# Patient Record
Sex: Male | Born: 1935
Health system: Southern US, Community
[De-identification: ages and names within clinical notes are randomized; demographics above are authoritative.]

## PROBLEM LIST (undated history)

## (undated) DIAGNOSIS — M1990 Unspecified osteoarthritis, unspecified site: Secondary | ICD-10-CM

## (undated) DIAGNOSIS — N183 Chronic kidney disease, stage 3 (moderate): Secondary | ICD-10-CM

## (undated) DIAGNOSIS — I700 Atherosclerosis of aorta: Secondary | ICD-10-CM

## (undated) DIAGNOSIS — I219 Acute myocardial infarction, unspecified: Secondary | ICD-10-CM

## (undated) DIAGNOSIS — I82402 Acute embolism and thrombosis of unspecified deep veins of left lower extremity: Secondary | ICD-10-CM

## (undated) DIAGNOSIS — I96 Gangrene, not elsewhere classified: Secondary | ICD-10-CM

## (undated) DIAGNOSIS — I2699 Other pulmonary embolism without acute cor pulmonale: Secondary | ICD-10-CM

## (undated) DIAGNOSIS — I10 Essential (primary) hypertension: Secondary | ICD-10-CM

## (undated) DIAGNOSIS — E785 Hyperlipidemia, unspecified: Secondary | ICD-10-CM

## (undated) HISTORY — PX: MELANOMA EXCISION: SHX5266

## (undated) HISTORY — DX: Hyperlipidemia, unspecified: E78.5

## (undated) HISTORY — PX: JOINT REPLACEMENT: SHX530

## (undated) HISTORY — DX: Essential (primary) hypertension: I10

## (undated) HISTORY — DX: Gangrene, not elsewhere classified: I96

## (undated) HISTORY — PX: COLONOSCOPY: SHX174

## (undated) HISTORY — DX: Other pulmonary embolism without acute cor pulmonale: I26.99

## (undated) HISTORY — DX: Acute myocardial infarction, unspecified: I21.9

## (undated) HISTORY — DX: Atherosclerosis of aorta: I70.0

## (undated) HISTORY — DX: Unspecified osteoarthritis, unspecified site: M19.90

## (undated) HISTORY — DX: Chronic kidney disease, stage 3 (moderate): N18.3

## (undated) HISTORY — DX: Acute embolism and thrombosis of unspecified deep veins of left lower extremity: I82.402

---

## 1998-04-27 ENCOUNTER — Ambulatory Visit (HOSPITAL_COMMUNITY): Admission: RE | Admit: 1998-04-27 | Discharge: 1998-04-27 | Payer: Self-pay | Admitting: Family Medicine

## 1998-04-27 ENCOUNTER — Encounter: Payer: Self-pay | Admitting: Family Medicine

## 2000-09-30 ENCOUNTER — Encounter: Payer: Self-pay | Admitting: Orthopedic Surgery

## 2000-10-06 ENCOUNTER — Inpatient Hospital Stay (HOSPITAL_COMMUNITY): Admission: RE | Admit: 2000-10-06 | Discharge: 2000-10-10 | Payer: Self-pay | Admitting: Orthopedic Surgery

## 2001-06-15 ENCOUNTER — Ambulatory Visit (HOSPITAL_COMMUNITY): Admission: RE | Admit: 2001-06-15 | Discharge: 2001-06-15 | Payer: Self-pay | Admitting: Gastroenterology

## 2001-06-15 ENCOUNTER — Encounter (INDEPENDENT_AMBULATORY_CARE_PROVIDER_SITE_OTHER): Payer: Self-pay | Admitting: Specialist

## 2002-02-04 ENCOUNTER — Inpatient Hospital Stay (HOSPITAL_COMMUNITY): Admission: EM | Admit: 2002-02-04 | Discharge: 2002-02-08 | Payer: Self-pay | Admitting: Emergency Medicine

## 2002-02-04 ENCOUNTER — Encounter: Payer: Self-pay | Admitting: Emergency Medicine

## 2002-09-23 ENCOUNTER — Inpatient Hospital Stay (HOSPITAL_COMMUNITY): Admission: EM | Admit: 2002-09-23 | Discharge: 2002-09-24 | Payer: Self-pay | Admitting: Emergency Medicine

## 2002-09-23 ENCOUNTER — Encounter: Payer: Self-pay | Admitting: Cardiology

## 2004-07-01 HISTORY — PX: CORONARY STENT PLACEMENT: SHX1402

## 2004-07-05 ENCOUNTER — Ambulatory Visit (HOSPITAL_COMMUNITY): Admission: RE | Admit: 2004-07-05 | Discharge: 2004-07-05 | Payer: Self-pay | Admitting: Gastroenterology

## 2004-10-11 ENCOUNTER — Ambulatory Visit: Payer: Self-pay | Admitting: Cardiology

## 2005-10-31 ENCOUNTER — Ambulatory Visit: Payer: Self-pay | Admitting: Cardiology

## 2005-11-13 ENCOUNTER — Ambulatory Visit: Payer: Self-pay | Admitting: Cardiology

## 2006-01-24 ENCOUNTER — Inpatient Hospital Stay (HOSPITAL_COMMUNITY): Admission: EM | Admit: 2006-01-24 | Discharge: 2006-02-01 | Payer: Self-pay | Admitting: Emergency Medicine

## 2006-11-19 ENCOUNTER — Ambulatory Visit: Payer: Self-pay | Admitting: Cardiology

## 2007-11-25 ENCOUNTER — Ambulatory Visit: Payer: Self-pay | Admitting: Cardiology

## 2008-08-24 ENCOUNTER — Ambulatory Visit: Payer: Self-pay

## 2008-08-24 ENCOUNTER — Ambulatory Visit: Payer: Self-pay | Admitting: Cardiology

## 2008-08-24 LAB — CONVERTED CEMR LAB
Calcium: 9.5 mg/dL (ref 8.4–10.5)
GFR calc Af Amer: 105 mL/min
GFR calc non Af Amer: 87 mL/min

## 2008-09-07 ENCOUNTER — Ambulatory Visit: Payer: Self-pay

## 2008-09-07 ENCOUNTER — Ambulatory Visit: Payer: Self-pay | Admitting: Cardiology

## 2008-09-07 LAB — CONVERTED CEMR LAB
Calcium: 9.6 mg/dL (ref 8.4–10.5)
Creatinine, Ser: 0.9 mg/dL (ref 0.4–1.5)
GFR calc Af Amer: 105 mL/min
Glucose, Bld: 96 mg/dL (ref 70–99)

## 2008-10-20 DIAGNOSIS — E7800 Pure hypercholesterolemia, unspecified: Secondary | ICD-10-CM

## 2008-10-20 DIAGNOSIS — K56609 Unspecified intestinal obstruction, unspecified as to partial versus complete obstruction: Secondary | ICD-10-CM | POA: Insufficient documentation

## 2008-10-20 DIAGNOSIS — R7989 Other specified abnormal findings of blood chemistry: Secondary | ICD-10-CM | POA: Insufficient documentation

## 2008-10-20 DIAGNOSIS — N19 Unspecified kidney failure: Secondary | ICD-10-CM | POA: Insufficient documentation

## 2008-10-20 DIAGNOSIS — I10 Essential (primary) hypertension: Secondary | ICD-10-CM | POA: Insufficient documentation

## 2008-11-02 ENCOUNTER — Ambulatory Visit: Payer: Self-pay | Admitting: Cardiology

## 2008-11-16 ENCOUNTER — Encounter: Payer: Self-pay | Admitting: Cardiology

## 2008-11-16 ENCOUNTER — Ambulatory Visit: Payer: Self-pay

## 2008-11-16 ENCOUNTER — Ambulatory Visit: Payer: Self-pay | Admitting: Cardiology

## 2008-11-18 LAB — CONVERTED CEMR LAB
Cholesterol: 147 mg/dL (ref 0–200)
LDL Cholesterol: 87 mg/dL (ref 0–99)
Total CHOL/HDL Ratio: 5
Triglycerides: 156 mg/dL — ABNORMAL HIGH (ref 0.0–149.0)

## 2009-02-08 ENCOUNTER — Ambulatory Visit: Payer: Self-pay | Admitting: Cardiology

## 2009-02-10 LAB — CONVERTED CEMR LAB
ALT: 16 units/L (ref 0–53)
AST: 29 units/L (ref 0–37)
Albumin: 4.1 g/dL (ref 3.5–5.2)
Alkaline Phosphatase: 73 units/L (ref 39–117)
Bilirubin, Direct: 0 mg/dL (ref 0.0–0.3)
HDL: 26.7 mg/dL — ABNORMAL LOW (ref >39.00)
Total CHOL/HDL Ratio: 6
Total Protein: 7.9 g/dL (ref 6.0–8.3)
VLDL: 42.2 mg/dL — ABNORMAL HIGH (ref 0.0–40.0)

## 2009-09-15 ENCOUNTER — Telehealth: Payer: Self-pay | Admitting: Cardiology

## 2009-09-20 ENCOUNTER — Encounter: Payer: Self-pay | Admitting: Cardiology

## 2009-12-13 ENCOUNTER — Ambulatory Visit: Payer: Self-pay | Admitting: Cardiology

## 2009-12-20 ENCOUNTER — Telehealth: Payer: Self-pay | Admitting: Cardiology

## 2010-01-18 ENCOUNTER — Telehealth (INDEPENDENT_AMBULATORY_CARE_PROVIDER_SITE_OTHER): Payer: Self-pay

## 2010-01-22 ENCOUNTER — Ambulatory Visit: Payer: Self-pay

## 2010-01-22 ENCOUNTER — Ambulatory Visit (HOSPITAL_COMMUNITY): Admission: RE | Admit: 2010-01-22 | Discharge: 2010-01-22 | Payer: Self-pay | Admitting: Cardiology

## 2010-01-22 ENCOUNTER — Encounter: Payer: Self-pay | Admitting: Cardiology

## 2010-01-22 ENCOUNTER — Encounter (INDEPENDENT_AMBULATORY_CARE_PROVIDER_SITE_OTHER): Payer: Self-pay

## 2010-01-22 ENCOUNTER — Ambulatory Visit: Payer: Self-pay | Admitting: Cardiology

## 2010-01-24 ENCOUNTER — Telehealth: Payer: Self-pay | Admitting: Cardiology

## 2010-01-25 ENCOUNTER — Telehealth: Payer: Self-pay | Admitting: Cardiology

## 2010-02-19 ENCOUNTER — Inpatient Hospital Stay (HOSPITAL_COMMUNITY): Admission: RE | Admit: 2010-02-19 | Discharge: 2010-02-22 | Payer: Self-pay | Admitting: Orthopedic Surgery

## 2010-05-15 ENCOUNTER — Encounter (INDEPENDENT_AMBULATORY_CARE_PROVIDER_SITE_OTHER): Payer: Self-pay | Admitting: *Deleted

## 2010-06-13 ENCOUNTER — Ambulatory Visit: Payer: Self-pay | Admitting: Cardiology

## 2010-06-13 ENCOUNTER — Encounter: Payer: Self-pay | Admitting: Cardiology

## 2010-06-27 ENCOUNTER — Telehealth: Payer: Self-pay | Admitting: Cardiology

## 2010-07-11 ENCOUNTER — Other Ambulatory Visit: Payer: Self-pay | Admitting: Cardiology

## 2010-07-11 ENCOUNTER — Ambulatory Visit
Admission: RE | Admit: 2010-07-11 | Discharge: 2010-07-11 | Payer: Self-pay | Source: Home / Self Care | Attending: Cardiology | Admitting: Cardiology

## 2010-07-11 LAB — BASIC METABOLIC PANEL
BUN: 24 mg/dL — ABNORMAL HIGH (ref 6–23)
CO2: 23 mEq/L (ref 19–32)
Calcium: 9.5 mg/dL (ref 8.4–10.5)
Chloride: 109 mEq/L (ref 96–112)
Creatinine, Ser: 1.1 mg/dL (ref 0.4–1.5)
GFR: 69.17 mL/min (ref >60.00)
Glucose, Bld: 83 mg/dL (ref 70–99)
Potassium: 5.2 mEq/L — ABNORMAL HIGH (ref 3.5–5.1)
Sodium: 141 mEq/L (ref 135–145)

## 2010-07-11 LAB — HEPATIC FUNCTION PANEL
ALT: 19 U/L (ref 0–53)
AST: 23 U/L (ref 0–37)
Albumin: 4.1 g/dL (ref 3.5–5.2)
Alkaline Phosphatase: 84 U/L (ref 39–117)
Bilirubin, Direct: 0.2 mg/dL (ref 0.0–0.3)
Total Bilirubin: 0.5 mg/dL (ref 0.3–1.2)
Total Protein: 7.6 g/dL (ref 6.0–8.3)

## 2010-07-11 LAB — LIPID PANEL
Cholesterol: 154 mg/dL (ref 0–200)
HDL: 32.6 mg/dL — ABNORMAL LOW (ref >39.00)
LDL Cholesterol: 89 mg/dL (ref 0–99)
Total CHOL/HDL Ratio: 5
Triglycerides: 162 mg/dL — ABNORMAL HIGH (ref 0.0–149.0)
VLDL: 32.4 mg/dL (ref 0.0–40.0)

## 2010-07-31 NOTE — Assessment & Plan Note (Signed)
Summary: appt 11:15/rov/surgical clearance right totaal hip/off plavix...  Medications Added OSCAL 500/200 D-3 500-200 MG-UNIT TABS (CALCIUM-VITAMIN D) once daily CRESTOR 5 MG TABS (ROSUVASTATIN CALCIUM) one tablet daily BENAZEPRIL HCL 40 MG TABS (BENAZEPRIL HCL) one tablet daily      Allergies Added:   Primary Provider:  Merri Brunette, MD  CC:  surgical clearance for right total hip replacement.  Pt also brought leave of absence paperwork.  .  History of Present Illness: 77 yo with history CAD (chronic total occlusion of LAD), HTN, hyperlipidemia presents for followup of coronary disease. He has had no chest pain.  Main complaint has been severe right hip pain.  He is in need of right THR.  He has been severely limited by his hip pain and is not very active. He is unable to get up a flight of steps.  He denies exertional dyspnea, but as mentioned, he is minimally active.    Patient has been off Crestor for several months (ran out and did not call for refills).  Similarly, he has been off benazepril for several weeks and BP is now running high (169/82).    ECG:  NSR, normal ECG  Labs (3/10):  K 4.6, creatinine 0.9 Labs (8/10): LDL 85, HDL 27, TGs 211  Current Medications (verified): 1)  Plavix 75 Mg Tabs (Clopidogrel Bisulfate) .... Take 1 Tablet Once Daily 2)  Atenolol 50 Mg Tabs (Atenolol) .... Take 1 Tablet Once Daily 3)  Oscal 500/200 D-3 500-200 Mg-Unit Tabs (Calcium-Vitamin D) .... Once Daily  Allergies (verified): 1)  ! Aspirin  Past History:  Past Medical History: 1.  CAD:  Pt's 1st MI was in 1982.  He has a chronic total occlusion of the LAD with good collaterals from the RCA.  Echo (5/10): EF 55-60%, normal wall motion, mild MR, PA systolic pressure 32 mmHg.  2.  HYPERCHOLESTEROLEMIA  IIA (ICD-272.0) 3.  HYPERTENSION, UNSPECIFIED (ICD-401.9) 4.  SMALL BOWEL OBSTRUCTION (ICD-560.9) 5.  Aspirin allergy:  Pt is taking Plavix 6.  Intolerances to Vytorin, Zocor,  pravastatin.     Family History: Reviewed history from 10/20/2008 and no changes required. Family History of Hypertension:   Social History: Reviewed history from 11/02/2008 and no changes required. Works as a Merchandiser, retail at [employer]  Tobacco Use - No (quit 1984)  Alcohol Use - no Drug Use - no  Review of Systems       All systems reviewed and negative except as per HPI.   Vital Signs:  Patient profile:   77 year old male Height:      67 inches Weight:      150 pounds BMI:     23.58 Pulse rate:   61 / minute Pulse rhythm:   regular BP sitting:   168 / 82  (left arm) Cuff size:   regular  Vitals Entered By: Judithe Modest CMA (December 13, 2009 11:10 AM)  Physical Exam  General:  Well developed, well nourished, in no acute distress. Neck:  Neck supple, no JVD. No masses, thyromegaly or abnormal cervical nodes. Lungs:  Clear bilaterally to auscultation and percussion. Heart:  Non-displaced PMI, chest non-tender; regular rate and rhythm, S1, S2 without murmurs.  +S4. Carotid upstroke normal, no bruit. Pedals normal pulses. No edema, no varicosities. Abdomen:  Bowel sounds positive; abdomen soft and non-tender without masses, organomegaly, or hernias noted. No hepatosplenomegaly. Msk:  Antalgic gait Extremities:  No clubbing or cyanosis. Neurologic:  Alert and oriented x 3. Psych:  Normal affect.  Impression & Recommendations:  Problem # 1:  PREOPERATIVE EVALUATION Patient needs right THR.  He has very minimal exercise tolerance at this point due to hip pain.  I think that the safest course will be to get a dobutamine stress echo for risk stratification prior to surgery.  He will have to stop Plavix 5 days prior to surgery and start it back as soon as possible afterwards.    Problem # 2:  CAD, UNSPECIFIED SITE (ICD-414.00) Stable with no ischemic symptoms but minimal exercise capacity due to hip pain.  As above, DSE pre-surgery. He cannot take aspirin due to an  allergy but he is on Plavix.    Problem # 3:  HYPERTENSION, UNSPECIFIED (ICD-401.9) BP elevated.  Restart benazepril 40 mg daily with BMET and BP check in 2 wks.    Problem # 4:  HYPERCHOLESTEROLEMIA  IIA (ICD-272.0) Restart Crestor 5 mg daily and repeat lipids/LFTs in 2 months.  Other Orders: EKG w/ Interpretation (93000) Dobutamine Echo (Dobutamine Echo)  Patient Instructions: 1)  Your physician has recommended you make the following change in your medication:  2)  Start Crestor 5mg  daily 3)  Start Benazepril 40mg  daily 4)  Your physician recommends that you return for lab work in: 2 weeks BMP 414.05 5)  Your physician has requested that you regularly monitor and record your blood pressure readings at home.  Please use the same machine at the same time of day to check your readings. I will call you in 2 weeks to get the readings. Luana Shu  6)  Your physician recommends that you return for a FASTING lipid profile/liver profile in 2 months--414.05  7)  Your physician has requested that you have a dobutamine echocardiogram.  For further information please visit [email or web address]  Please follow instruction sheet as given. 8)  Your physician wants you to follow-up in: 6 months with Dr Shirlee Latch.   You will receive a reminder letter in the mail two months in advance. If you don't receive a letter, please call our office to schedule the follow-up appointment. Prescriptions: BENAZEPRIL HCL 40 MG TABS (BENAZEPRIL HCL) one tablet daily  #30 x 11   Entered by:   Katina Dung, RN, BSN   Authorized by:   Marca Ancona, MD   Signed by:   Katina Dung, RN, BSN on 12/13/2009   Method used:   Electronically to        [facility] [phone]* (retail)       [address]       [place], [place]  [address]       Ph: [phone] or [phone]       Fax: [phone]   RxID:   [id number] CRESTOR 5 MG TABS (ROSUVASTATIN CALCIUM) one tablet daily  #30 x 3   Entered by:   Katina Dung, RN, BSN    Authorized by:   Marca Ancona, MD   Signed by:   Katina Dung, RN, BSN on 12/13/2009   Method used:   [facility]ron[facility] to        [facility] (retail[facility]       [address]       [place], [place]  [address]       Ph: [phone] or [phone]       Fax: [phone]   RxID:   [id number]

## 2010-07-31 NOTE — Progress Notes (Signed)
Summary: returning call   Phone Note Call from Patient   Caller: Patient Reason for Call: Talk to Nurse Summary of Call: returning call -pls call [phone] Initial call taken by: Glynda Jaeger,  January 24, 2010 11:05 AM  Follow-up for Phone Call        called pt with dobutamine echo results from 01/22/10

## 2010-07-31 NOTE — Progress Notes (Signed)
Summary: Surgical Clearance   ---- Converted from flag ---- ---- 01/25/2010 11:07 AM, Erle Crocker wrote: Olegario Messier form [facility] is needing Clearnace on this Pt.  Please fax to [phone] call back [phone]  Thanks, Selena Batten ------------------------------  Phone Note From Other Clinic   Summary of Call: Pt had dobutamine echo performed.  Await final review of this test by Dr Shirlee Latch for surgical clearance.  Julieta Gutting, RN, BSN  January 25, 2010 2:52 PM     Appended Document: Surgical Clearance OK for surgery (DSE was normal).   Appended Document: Surgical Clearance clearance faxed by Julieta Gutting 01/25/10

## 2010-07-31 NOTE — Progress Notes (Signed)
Summary: appt   Phone Note Call from Patient Call back at Home Phone [phone]   Caller: Patient Reason for Call: Talk to Nurse Summary of Call: pt unable to get off of work for appt, is it necessary that he comes in before his colonoscopy Initial call taken by: Migdalia Dk,  September 15, 2009 8:05 AM  Follow-up for Phone Call        per Dr Almon Hercules does not need appt with him before colonoscopy scheduled  for 09-20-09--I talked with wife to let her know pt does not need to see Dr Shirlee Latch before colonoscopy but should schedule an appt to see Dr Shirlee Latch soon(he is overdue). I faxed procedure form for colonoscopy from [facility] back to [facility] will forward form  to medical records to be scanned into EMR

## 2010-07-31 NOTE — Progress Notes (Signed)
Summary: Dobutamine Echo Pre-Procedure  Phone Note Outgoing Call Call back at [facility] Phone [phone]   Call placed by: Irean Hong, RN,  January 18, 2010 11:12 AM Summary of Call: Left message on patient's recorder with instructions for Dobutamine Echo. Saul Dorsi,RN.

## 2010-07-31 NOTE — Miscellaneous (Signed)
Summary: IV for Dobutamine Echo  Clinical Lists Changes     IV 20G angiocath (R) wrist for Dobutamine Echo. Nioka Thorington,RN.

## 2010-07-31 NOTE — Progress Notes (Signed)
Summary: bp readings 12/20/09   Phone Note Call from Patient   Summary of Call: BP READINGS 6/18 123/70 6/20 132/60s  Initial call taken by: Migdalia Dk,  December 20, 2009 8:03 AM  Follow-up for Phone Call        discussed with pt by telephone--will forward to Dr Shirlee Latch for review      Appended Document: bp readings 12/20/09--LMTCB 12/25/09 Good BP, continue current meds  Appended Document: bp readings 12/20/09 LMTCB   Appended Document: bp readings 12/20/09 LMTCB  Appended Document: bp readings 12/20/09 discussed with pt by telephone

## 2010-07-31 NOTE — Letter (Signed)
Summary: [facility] Surgical Clearance  [facility] Surgical Clearance   Imported By: Roderic Ovens 09/29/2009 10:38:50  _____________________________________________________________________  External Attachment:    Type:   Image     Comment:   External Document

## 2010-07-31 NOTE — Letter (Signed)
Summary: Appointment - Reminder 2  [facility], Main Office  [address]   [place], [place] [address]   Phone: [phone]  Fax: [phone]          May 15, 2010 MRN: [id number]        [name] [name] [address] [place], [place]  [address]     Dear Mr. Burgher,  Our records indicate that it is time to schedule a follow-up appointment.  Dr.Mclean recommended that you follow up with [place] in December 2011. It is very important that we reach you to schedule this appointment. We look forward to participating in your health care needs. Please contact us at the number listed above at your earliest convenience to schedule your appointment.  If you are unable to make an appointment at this time, give [place] a call so we can update our records.     Sincerely,   Glass blower/designer

## 2010-08-02 NOTE — Progress Notes (Signed)
Summary: BP f/u    ---- Converted from flag ---- ---- 06/14/2010 9:39 AM, Sherri Rad, RN, BSN wrote:   ---- 06/13/2010 2:00 PM, Katina Dung, RN, BSN wrote: [facility] you call him in 2 weeks (around 06/27/10) to get B/P readings for Dr Shirlee Latch? I will be gone that week. Thanks. Thurston Hole ------------------------------  Phone Note Outgoing Call   Call placed by: Sherri Rad, RN, BSN,  June 27, 2010 10:11 AM Call placed to: Patient Summary of Call: I left a message for the pt to call. Sherri Rad, RN, BSN  June 27, 2010 10:11 AM  I called the pt back to f/u on his bp readings. He states he has taken these about 3 or 4 times, but he cannot find one of the readings. He did report on 12/15- his SBP was 149. He reported a DBP of 125, but said he didn't think he wrote that down correctly. on 12/18- 130/63, & 12/20 - 125/66. He did not have his HR's written on the same paper and couldn't locate these, but he made the comment that he thought his HR's were reading a little low. I instructed the pt to continue to monitor his BP readings, but that I would pass these readings on to Dr. Shirlee Latch. He states that the day he came for his office visit, he had not had his meds. I explained we will call back after reviewing with Dr. Shirlee Latch. The pt is agreeable. Initial call taken by: Sherri Rad, RN, BSN,  June 28, 2010 3:36 PM     Appended Document: BP f/u  check and record BP and pulse daily for a week or so, call him back at that time.   Appended Document: BP f/u  I talked with pt--recent B/P readings  135/72 and 124/68 on medications--pt states he has been taking his medication regularly-  Appended Document: BP f/u  BP good  Appended Document: BP f/u  pt notified BP good

## 2010-08-02 NOTE — Assessment & Plan Note (Signed)
Summary: PER CHECK OUT/SF  Medications Added PLAVIX 75 MG TABS (CLOPIDOGREL BISULFATE) take 1 tablet once daily ATENOLOL 50 MG TABS (ATENOLOL) take 1 tablet once daily NITROSTAT 0.4 MG SUBL (NITROGLYCERIN) 1 tablet under tongue at onset of chest pain; you may repeat every 5 minutes for up to 3 doses. BENADRYL 25 MG TABS (DIPHENHYDRAMINE HCL) as needed      Allergies Added:   Visit Type:  Follow-up Primary Provider:  Merri Brunette, MD  CC:  no complaints.  History of Present Illness: 77 yo with history CAD (chronic total occlusion of LAD), HTN, hyperlipidemia presents for followup of coronary disease.   Patient had a dobutamine stress echo in 7/11 (pre-operative prior to right THR) that showed no evidence for ischemia or infarction.  He had no complications with his hip replacement and has been progressing well with physical therapy.  He is walking much better now, sometimes using a cane and sometimes not.  He is not short of breath with exertion.  No chest pain.  He has not taken any medications for 2 weeks (ran out).   ECG: NSR, inferior Qs  Labs (3/10):  K 4.6, creatinine 0.9 Labs (8/10): LDL 85, HDL 27, TGs 211  Current Medications (verified): 1)  Plavix 75 Mg Tabs (Clopidogrel Bisulfate) .... Take 1 Tablet Once Daily 2)  Atenolol 50 Mg Tabs (Atenolol) .... Take 1 Tablet Once Daily 3)  Oscal 500/200 D-3 500-200 Mg-Unit Tabs (Calcium-Vitamin D) .... Once Daily 4)  Crestor 5 Mg Tabs (Rosuvastatin Calcium) .... One Tablet Daily 5)  Benazepril Hcl 40 Mg Tabs (Benazepril Hcl) .... One Tablet Daily 6)  Nitrostat 0.4 Mg Subl (Nitroglycerin) .Marland Kitchen.. 1 Tablet Under Tongue At Onset of Chest Pain; You May Repeat Every 5 Minutes For Up To 3 Doses. 7)  Benadryl 25 Mg Tabs (Diphenhydramine Hcl) .... As Needed  Allergies (verified): 1)  ! Aspirin  Past History:  Past Medical History: 1.  CAD:  Pt's 1st MI was in 1982.  He has a chronic total occlusion of the LAD with good collaterals from  the RCA.  Echo (5/10): EF 55-60%, normal wall motion, mild MR, PA systolic pressure 32 mmHg.  Dobutamine stress echo (7/11) with EF 60% and no evidence for ischemia or infarction.  2.  HYPERCHOLESTEROLEMIA  IIA (ICD-272.0) 3.  HYPERTENSION, UNSPECIFIED (ICD-401.9) 4.  SMALL BOWEL OBSTRUCTION (ICD-560.9) 5.  Aspirin allergy:  Pt is taking Plavix 6.  Intolerances to Vytorin, Zocor, pravastatin.  7.  Right total hip replacement in 2011    Family History: Reviewed history from 10/20/2008 and no changes required. Family History of Hypertension:   Social History: Recently retired from [employer] Married  Tobacco Use - No (quit 1984)  Alcohol Use - no Drug Use - no  Review of Systems       All systems reviewed and negative except as per HPI.   Vital Signs:  Patient profile:   77 year old male Height:      67 inches Weight:      155 pounds BMI:     24.36 Pulse rate:   73 / minute BP sitting:   142 / 80  (left arm) Cuff size:   regular  Vitals Entered By: Hardin Negus, RMA (June 13, 2010 9:59 AM)  Physical Exam  General:  Well developed, well nourished, in no acute distress. Neck:  Neck supple, no JVD. No masses, thyromegaly or abnormal cervical nodes. Lungs:  Clear bilaterally to auscultation and percussion. Heart:  Non-displaced PMI,  chest non-tender; regular rate and rhythm, S1, S2 without murmurs.  +S4. Carotid upstroke normal, no bruit. Pedals normal pulses. No edema, no varicosities. Abdomen:  Bowel sounds positive; abdomen soft and non-tender without masses, organomegaly, or hernias noted. No hepatosplenomegaly. Extremities:  No clubbing or cyanosis. Neurologic:  Alert and oriented x 3. Psych:  Normal affect.   Impression & Recommendations:  Problem # 1:  CAD, UNSPECIFIED SITE (ICD-414.00) Stable with no ischemic symptoms.  He has done well post-THR.  DSE was normal in 7/11.  Unfortunately, he has been off all his medications for 2 weeks.  Restart Plavix,  atenolol, Crestor, and benazepril.  He is allergic to aspirin.  I will have him followup in 3 months to see how he is doing back on his meds.   Problem # 2:  HYPERCHOLESTEROLEMIA  IIA (ICD-272.0) Restart Crestor, will check lipids/LFTs in a month.   Problem # 3:  HYPERTENSION, UNSPECIFIED (ICD-401.9) BMET in a month after restarting benazepril.   Other Orders: EKG w/ Interpretation (93000)  Patient Instructions: 1)  Your physician has recommended you make the following change in your medication:  2)  Take Atenolol 50mg  daily. 3)  Take Plavix 75mg  daily. 4)  Take Crestor 5mg  daily. 5)  Take Benazepril 40mg  daily. 6)  PLEASE CALL OUR OFFICE IF YOU NEED REFILLS ON THESE MEDICATIONS. 7)  Take and record your blood pressure about 2 hours after you take your medication. I will call you in 2 weeks to get the readings. Luana Shu. 8)  Your physician recommends that you return for a FASTING lipid profile/liver profile/BMP in 1 month---414.01 401.9  9)  Your physician recommends that you schedule a follow-up appointment in: 3 months with Dr Shirlee Latch. Prescriptions: PLAVIX 75 MG TABS (CLOPIDOGREL BISULFATE) take 1 tablet once daily  #90 x 3   Entered by:   Katina Dung, RN, BSN   Authorized by:   Marca Ancona, MD   Signed by:   Katina Dung, RN, BSN on 06/13/2010   Method used:   Print then Give to Patient   RxID:   [id number] BENAZEPRIL HCL 40 MG TABS (BENAZEPRIL HCL) one tablet daily  #90 x 3   Entered by:   Katina Dung, RN, BSN   Authorized by:   Marca Ancona, MD   Signed by:   Katina Dung, RN, BSN on 06/13/2010   Method used:   Print then Give to Patient   RxID:   [id number] CRESTOR 5 MG TABS (ROSUVASTATIN CALCIUM) one tablet daily  #90 x 3   Entered by:   Katina Dung, RN, BSN   Authorized by:   Marca Ancona, MD   Signed by:   Katina Dung, RN, BSN on 06/13/2010   Method used:   Print then Give to Patient   RxID:   [id number] ATENOLOL 50 MG TABS  (ATENOLOL) take 1 tablet once daily  #90 x 3   Entered by:   Katina Dung, RN, BSN   Authorized by:   Marca Ancona, MD   Signed by:   Katina Dung, RN, BSN on 06/13/2010   Method used:   Print then Give to Patient   RxID:   [id number] CRESTOR 5 MG TABS (ROSUVASTATIN CALCIUM) one tablet daily  #90 x 3   Entered by:   Katina Dung, RN, BSN   Authorized by:   Marca Ancona, MD   Signed by:   Katina Dung, RN, BSN on 06/13/2010   Method used:   Print then Give to  Patient   RxID:   [id number] PLAVIX 75 MG TABS (CLOPIDOGREL BISULFATE) take 1 tablet once daily  #90 x 3   Entered by:   Katina Dung, RN, BSN   Authorized by:   Marca Ancona, MD   Signed by:   Katina Dung, RN, BSN on 06/13/2010   Method used:   Electronically to        [facility] [phone]* (retail)       [address] [place], [place]  [address]       Ph: [phone] or [phone]       Fax: [phone]   RxID:   [id number] BENAZEPRIL HCL 40 MG TABS (BENAZEPRIL HCL) one tablet daily  #30 x 6   Entered by:   Katina Dung, RN, BSN   Authorized by:   Marca Ancona, MD   Signed by:   Katina Dung, RN, BSN on 06/13/2010   Method used:   Electronically to        [facility] [phone]* (retail)       [address] [place], [place]  [address]       Ph: [phone] or [phone]       Fax: [phone]   RxID:   [id number] CRESTOR 5 MG TABS (ROSUVASTATIN CALCIUM) one tablet daily  #30 x 6   Entered by:   Katina Dung, RN, BSN   Authorized by:   Marca Ancona, MD   Signed by:   Katina Dung, RN, BSN on 06/13/2010   Method used:   Electronically to        [facility] [phone]* (retail)       [address] [place], [place]  [address]       Ph: [phone] or [phone]       Fax: [phone]   RxID:   [id number] ATENOLOL 50 MG TABS (ATENOLOL) take 1 tablet once daily  #30 x 6   Entered by:   Katina Dung, RN, BSN   Authorized by:   Marca Ancona, MD    Signed by:   Katina Dung, RN, BSN on 06/13/2010   Method used:   Electronically to        [facility] [phone]* (retail)       [address] [place], [place]  [address]       Ph: [phone] or [phone]       Fax: [phone]   RxID:   [id number] PLAVIX 75 MG TABS (CLOPIDOGREL BISULFATE) take 1 tablet once daily  #30 x 6   Entered by:   Katina Dung, RN, BSN   Authorized by:   Marca Ancona, MD   Signed by:   Katina Dung, RN, BSN on 06/13/2010   Method used:   Electronically to        [facility] [phone]* (retail)       [address] [place], [place]  [address]       Ph: [phone] or [phone]       Fax: [phone]   RxID:   [id number]

## 2010-09-13 LAB — URINALYSIS, ROUTINE W REFLEX MICROSCOPIC
Hgb urine dipstick: NEGATIVE
Ketones, ur: 15 mg/dL — AB
Nitrite: NEGATIVE
Protein, ur: NEGATIVE mg/dL
Specific Gravity, Urine: 1.03 (ref 1.005–1.030)
pH: 5.5 (ref 5.0–8.0)

## 2010-09-13 LAB — BASIC METABOLIC PANEL
CO2: 27 mEq/L (ref 19–32)
CO2: 29 mEq/L (ref 19–32)
Calcium: 8.1 mg/dL — ABNORMAL LOW (ref 8.4–10.5)
Chloride: 105 mEq/L (ref 96–112)
Creatinine, Ser: 1.43 mg/dL (ref 0.4–1.5)
GFR calc Af Amer: >60 mL/min (ref >60)
Glucose, Bld: 151 mg/dL — ABNORMAL HIGH (ref 70–99)
Sodium: 140 mEq/L (ref 135–145)

## 2010-09-13 LAB — PROTIME-INR
INR: 0.94 (ref 0.00–1.49)
Prothrombin Time: 12.8 seconds (ref 11.6–15.2)
Prothrombin Time: 14.6 seconds (ref 11.6–15.2)
Prothrombin Time: 17.1 seconds — ABNORMAL HIGH (ref 11.6–15.2)
Prothrombin Time: 23.6 seconds — ABNORMAL HIGH (ref 11.6–15.2)

## 2010-09-13 LAB — CBC
Hemoglobin: 8.4 g/dL — ABNORMAL LOW (ref 13.0–17.0)
Hemoglobin: 9.4 g/dL — ABNORMAL LOW (ref 13.0–17.0)
MCH: 31.3 pg (ref 26.0–34.0)
MCH: 31.8 pg (ref 26.0–34.0)
MCH: 32 pg (ref 26.0–34.0)
MCHC: 33.8 g/dL (ref 30.0–36.0)
MCHC: 33.9 g/dL (ref 30.0–36.0)
MCHC: 33.9 g/dL (ref 30.0–36.0)
MCV: 92.7 fL (ref 78.0–100.0)
MCV: 93.9 fL (ref 78.0–100.0)
Platelets: 147 10*3/uL — ABNORMAL LOW (ref 150–400)
Platelets: ADEQUATE 10*3/uL (ref 150–400)
RBC: 4.63 MIL/uL (ref 4.22–5.81)

## 2010-09-13 LAB — DIFFERENTIAL
Basophils Absolute: 0 10*3/uL (ref 0.0–0.1)
Eosinophils Absolute: 0.2 10*3/uL (ref 0.0–0.7)
Lymphs Abs: 2.2 10*3/uL (ref 0.7–4.0)
Monocytes Relative: 8 % (ref 3–12)
Neutro Abs: 6.3 10*3/uL (ref 1.7–7.7)
Neutrophils Relative %: 66 % (ref 43–77)

## 2010-09-13 LAB — TYPE AND SCREEN: ABO/RH(D): A NEG

## 2010-09-13 LAB — APTT: aPTT: 28 seconds (ref 24–37)

## 2010-09-17 ENCOUNTER — Ambulatory Visit: Payer: Self-pay | Admitting: Cardiology

## 2010-11-13 NOTE — Assessment & Plan Note (Signed)
[facility]                            CARDIOLOGY OFFICE NOTE   NAME:[name] [name], [name] [name]                     MRN:          [id number]  DATE:11/19/2006                            DOB:          1927/09/08    PRIMARY CARE PHYSICIAN:  [name] [name], M.D. at [facility].   HISTORY OF PRESENT ILLNESS:  [name] [name] is a 77 year old gentleman with  coronary disease.  He has a chronic total occlusion of the mid LAD with  robust collaterals from the right.  His ejection fraction is normal.  We  have managed this medically.   [name] [name] remains very active.  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  He has no angina with these  activities.  If he does rush up a flight of stairs, he does have some  mild exertional dyspnea.  He has not had any paroxysmal nocturnal  dyspnea, orthopnea, edema, claudication, or chest pain.   CURRENT MEDICATIONS:  1. Plavix 75 mg daily.  2. Os-Cal.  3. Multivitamin.  4. Atenolol 50 mg daily.  5. Avapro 150 mg daily.   ALLERGIES:  ASPIRIN causes breathing difficulty and hives.  He is also  allergic to NON-STEROIDAL ANTI-INFLAMMATORIES.   PHYSICAL EXAM:  He is generally well-appearing in no distress.  Heart rate 77, blood pressure 144/68, weight 153 pounds.  He has no jugular venous distension, thyromegaly, or lymphadenopathy.  LUNGS:  Clear to auscultation.  He has a nondisplaced point of maximal cardiac impulse.  There is a  regular rate and rhythm without murmur, rub, or gallop.  ABDOMEN:  Soft, nondistended, and nontender.  There is no  hepatosplenomegaly.  Bowel sounds normal.  EXTREMITIES:  Warm without cyanosis, clubbing, or edema, or ulceration.  Femoral pulses 2+ bilaterally without bruits.   ELECTROCARDIOGRAM:  Demonstrates normal sinus rhythm and is a normal  EKG.  This is unchanged from a year ago.   IMPRESSION/RECOMMENDATIONS:  1. Coronary artery disease:   Status post inferior myocardial      infarction in 2003 with chronic total occlusion of the left      anterior descending.  Continue Plavix, ARB, and beta blocker.  No      aspirin due to his allergy.  2. Hypercholesterolemia:  Has not tolerated Zocor due to GI      complaints.  Will try Pravachol 20 mg daily.   As I will be leaving, I will have him follow up with [name] [name] in  1 year's time.     [name] Farber, MD  Electronically Signed    WED/MedQ  DD: 11/19/2006  DT: 11/19/2006  Job #: [id number]

## 2010-11-13 NOTE — Assessment & Plan Note (Signed)
[facility]                            CARDIOLOGY OFFICE NOTE   NAME:[name] [name], [name] [name]                     MRN:          [id number]  DATE:11/25/2007                            DOB:          05-27-1928    PRIMARY CARE PHYSICIAN:  [name] [name], M.D.   REASON FOR VISIT:  Routine cardiac followup.   HISTORY OF PRESENT ILLNESS:  [name] [name] is a 77 year old male with a  history of cardiovascular disease including a chronic total occlusion of  the mid left anterior descending supplied distally by well-developed  collaterals from the right coronary artery.  He has been symptomatically  stable over the last few years on medical therapy.  He was last seen by  [name] [name] in May of 2008.  His electrocardiogram today is normal.  He  denies having any angina or limiting dyspnea on exertion.  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  He is not reporting  any claudication.  In reviewing his chart I see that he has an ASPIRIN  allergy and is taking Plavix long term.  He has had prior intolerances  to statin therapy including Zocor, Vytorin and most recently Pravachol.  He seems to report mainly gastrointestinal side effects.   ALLERGIES:  ASPIRIN, RELAFEN.  Intolerances to ZOCOR, VYTORIN and  PRAVASTATIN.   PRESENT MEDICATIONS:  1. Plavix 75 mg p.o. daily.  2. Os-Cal with vitamin D.  3. Atenolol 50 mg p.o. daily.  4. Avapro 150 mg p.o. daily.  5. Sublingual nitroglycerin p.r.n.   REVIEW OF SYSTEMS:  Described in history of present illness.  He states  he has an ulcerated area on his lower anterior left leg just above his  ankle that has been nonhealing for the last year.  He states that this  is occasionally pruritic but nonpainful.  He has been putting  antibiotics on it without any avail.  He does state that he has a  dermatologist and he is due to see him next month and he has biopsied  other skin lesions.   PHYSICAL  EXAMINATION:  VITAL SIGNS:  Blood pressure is 116/64, heart  rate is 65, weight is 157 pounds.  GENERAL:  The patient is comfortable and in no acute distress.  His  weight is overall stable.  NECK:  Examination reveals no elevated jugular venous pressure.  No loud  carotid bruits.  No thyromegaly.  LUNGS:  Are clear without labored breathing.  CARDIAC:  Exam reveals a regular rate and rhythm.  No pathologic murmur  or S3 gallop.  ABDOMEN:  Soft, nontender.  EXTREMITIES:  Exhibit no frank pitting edema.  There is an excoriated  shallow ulcerated area approximately the size of a penny on the left  anterior aspect of the lower leg above the ankle.  There is no bleeding  noted here.  There is no purulence.  The skin is dry and flaky around  the region.   IMPRESSION AND RECOMMENDATIONS:  1. Coronary artery disease with known chronic total occlusion of the      mid left  anterior descending supplied distally by well-developed      collaterals from the right coronary artery.  This is stable and      being managed medically.  The patient is doing well in terms of      symptom control.  Electrocardiogram today is normal.  We will plan      to continue yearly observation.  He will be scheduled to see Dr.      Shirlee [name].  2. History of statin intolerance, most recently to Pravachol.  His LDL      was 115 based on blood work of the last few years.  He is a bit      hesitant to consider different statin medications although it might      be worthwhile trying low dose Crestor, perhaps 5 mg daily.  I will      leave this to [name] [name] discretion.  3. I asked [name] [name] to make sure he discusses either with [name] [name]      or his dermatologist the poorly healing ulcerative area on his leg.      This may well need a biopsy.     [name] Sidle, MD  Electronically Signed    SGM/MedQ  DD: 11/25/2007  DT: 11/25/2007  Job #: [id number]   cc:   [name] [name], M.D.

## 2010-11-16 NOTE — Procedures (Signed)
[place]. [facility]  Patient:    [name] [name], [name] [name] Visit Number: [id number] MRN: [id number]          Service Type: END Location: ENDO Attending Physician:  Nelda Marseille Dictated by:   Petra Kuba, M.D. Proc. Date: 06/15/01 Admit Date:  06/15/2001   CC:         Earl Lites D. Marina Goodell, M.D.   Procedure Report  PROCEDURE:  Colonoscopy with biopsy and polypectomy.  INDICATION:  Patient with diarrhea and weight loss, overdue for colonic screening.  Consent was signed after risks, benefits, methods, options thoroughly discussed in the office.  MEDICATIONS:  Demerol 70 mg, Versed 7 mg.  DESCRIPTION OF PROCEDURE:  Rectal inspection was pertinent for external hemorrhoids, small.  Digital exam was negative.  The colonoscope was inserted, fairly easily advanced around the colon to the cecum.  This did require rolling him on his back with minimal abdominal pressure.  On insertion a distal sigmoid small polyp was seen as well as significant left-sided diverticula.  No other abnormalities were seen.  The cecum was identified by the appendiceal orifice and the ileocecal valve.  In fact, the scope was inserted a short way into the terminal ileum, which was normal.  Photo documentation was obtained, as were scattered biopsies.  The scope was slowly withdrawn.  The right side of the colon was normal.  Random biopsies from all parts of the colon were obtained and put in a second container.  Diverticula started in the distal transverse and continued back to the sigmoid.  No other polypoid lesions were seen except for the one in the distal sigmoid, which when re-found was snared with electrocautery and the polyp collected through the scope and collected in the trap.  Once back in the rectum, the scope was then retroflexed, pertinent for some internal hemorrhoids.  The scope was straightened and readvanced a short way up the left side of the colon, air was suctioned,  the scope removed.  The patient tolerated the procedure well. There was no obvious immediate complication.  ENDOSCOPIC DIAGNOSES: 1. Internal-external hemorrhoids. 2. Small distal sigmoid polyp, status post snare polypectomy. 3. Left significant diverticula. 4. Otherwise within normal limits to the terminal ileum, status post random    biopsies throughout.  PLAN:  Await pathology.  Consider CT or upper GI small bowel follow-through next with trial of medicines like possibly an antispasmodic, and will touch base with him about the biopsies and decided further workup plans or follow-up at that juncture. Dictated by:   Petra Kuba, M.D. Attending Physician:  Nelda Marseille DD:  06/15/01 TD:  06/15/01 Job: [id number]

## 2010-11-16 NOTE — Op Note (Signed)
[place]. [facility]  Patient:    [name] [name], [name] [name]                     MRN: [id number] Proc. Date: 10/06/00 Adm. Date:  [id number] Attending:  Alinda Deem                           Operative Report  PREOPERATIVE DIAGNOSIS:  Degenerative arthritis of the left hip.  POSTOPERATIVE DIAGNOSIS:  Degenerative arthritis of the left hip.  OPERATION PERFORMED:  Hybrid left total hip arthroplasty using Osteonics components, a 54 mm Dual Geometry no hole cup with a central occluder, 10 degree crossfire liner indexed posterior and superior to accept an NK +0 28 mm head, a #8 ODC stem, 12 mm tip and a #4 cement restrictor.  Simplex cement with antibiotics.  SURGEON:  Alinda Deem, M.D.  ASSISTANT:  Dorthula Matas, P.A.-C.  ANESTHESIA:  General endotracheal.  ESTIMATED BLOOD LOSS:  300 cc.  FLUID REPLACEMENT:  1L of crystalloid.  DRAINS:  None.  TOURNIQUET TIME:  None.  INDICATIONS FOR PROCEDURE:  The patient is a 77 year old male with end stage arthritis of his left and right hip who desires his left hip to be replaced first.  He has failed conservative treatment with anti-inflammatory medicine, physical therapy, use of a crutch or cane and has pain that prevents activities of daily living, wakes him up at night and he is otherwise miserable.  DESCRIPTION OF PROCEDURE:  The patient was identified by arm band and taken to the operating room at [facility] main hospital where the appropriate anesthetic monitors were attached and general endotracheal anesthesia induced with the patient in the supine position.  He was then rolled into the right lateral decubitus position and fixed there with a ____________ Loraine Leriche 2 pelvic clamp and the left lower extremity prepped and draped in the usual sterile fashion from the ankle to the hemipelvis.  We began the procedure by making an incision allowing a posterolateral approach to the center of the  greater trochanter 20 cm in length through the skin and the subcutaneous tissues down to the level of the iliotibial band which was cut in line with the skin incision. This exposed the greater trochanter and gluteus medius and the short external rotators.  A cobra retractor was placed between the superior hip joint capsule and the gluteus minimus superiorly and inferiorly between the inferior hip joint capsule and the quadratus femoris.  This exposed the short external rotators and piriformis tendon which were tagged with two #2 Ethibond sutures and cut off at their insertion on the intertrochanteric crest exposing the posterior aspect of the hip joint capsule which was developed into an acetabular based flap and likewise tagged with two #2 Ethibond sutures.  This allowed flexion and internal rotation of the hip with dislocation of the femoral head which was then cut off of the femoral neck one fingerbreadth above the lesser trochanter with a power oscillating saw.  The femoral head was observed to be down to bare bone superiorly on the weightbearing dome. The hip was then placed in extension and neutral rotation and the proximal femur translocated anteriorly using a Hohmann retractor levering off the anterior column and inferiorly we used a cobra retractor with a spike off the acetabular notch.  A posterior superior and posterior inferior  wing retractors were then placed in the periacetabular region completing exposure of the labrum which  was then removed  with the electrocautery including the ____________ tissue.  We then sequentially reamed the acetabulum up to the 54 mm basket reamer, obtaining good chatter and getting into the subchondral bone.  A 54 mm Dual Geometry cutter was then introduced in 45 degrees of abduction and 20 degrees of anteversion and completing the acetabular preparation.  A 54 mm Dual Geometry shell with a single central occluder hole was then hammered into place  obtaining an excellent fit and you could actually move the pelvis using the lever on the acetabular component.  Excess bone was trimmed from the periacetabular region and a trial liner with a 10 degree index posterior and superior was then placed in the shell.  The hip was then flexed and internally rotated  exposing the proximal femur which was then sequentially reamed up to a #8 Osteonics tapered reamer cutting into the greater trochanter maintaining a straight line down the femur.  We then broached up to a #8 broach, did calcar milling to finish off our calcar preparation, placed a 30 mm neck on the broach with an NK +0 head and did a trial reduction.  The hip could be flexed to 90 with 60 of internal rotation. In full extension, it could be externally rotated about 40 degrees and excellent stability was documented.  At this point the trial components were removed.  The wound was washed out with normal saline solution, pulse lavaged and a 10 degree crossfire polyethylene liner hammered into place with the index posterior and superior.  A #4 central occluder was then sized to the proximal femur and driven down to the appropriate depth.  We then waterpicked the proximal femur clean, dried it with suction and sponges, a double batch of Simplex polymethyl methacrylate cement with 1500 mg of Zinacef was then mixed and then injected under pressure into the femur.  A #8 ODC stem with a 12 mm tip was then hammered into place and the excess cement removed.  The component was kept in 20 degrees of anteversion in relation to the femur and the cement allowed to cure.  An NK +0 28 mm cobalt chrome head was then hammered onto the stem and the hip again reduced and excellent stability was noted.  At this point the wound was washed out with normal saline solution.  Small bleeders were once again identified and cauterized.  The posterior aspect of the hip joint capsule, piriformis and short external  rotator tendons repaired back to the intertrochanteric crest through drill holes.  The iliotibial band was closed with a running #1 Vicryl suture, the subcutaneous tissue with 0 and 2-0  undyed Vicryl sutures and the skin with running interlocking 3-0 nylon suture. A dressing of Xeroform, 4 x 4 dressing sponges and Hypafix tape applied.  The patient was then awakened and taken to the recovery room without difficulty. DD:  10/06/00 TD:  10/06/00 Job: [id number]

## 2010-11-16 NOTE — Discharge Summary (Signed)
NAME:  [name] [name], [name] [name]                        ACCOUNT NO.:  [id number]   MEDICAL RECORD NO.:  [id number]                   PATIENT TYPE:  INP   LOCATION:  4706                                 FACILITY:  [facility]   PHYSICIAN:  Guy Franco                          DATE OF BIRTH:  Mar 16, 1928   DATE OF ADMISSION:  02/04/2002  DATE OF DISCHARGE:  02/08/2002                           DISCHARGE SUMMARY - REFERRING   HOSPITAL COURSE:  The patient is a 77 year old male patient who presented to  [facility] ER on February 04, 2002 with an acute inferior myocardial  infarction.  Over the two days prior to admission, he complained of  stuttering pain that was around 7 a.m. and the pain worsened and he  eventually presented to the emergency room at [facility].  His EKG  revealed acute 3-mm ST segment elevation in the inferior leads.  He was  intimally take to the catheterization lab by Dr. Lewayne Bunting for vessel  visualization and he was noted to have an LAD occluded after the first  septal.  D1 had a proximal 60% stenosis, circumflex approximately a 40%,  with an OM of 40%.  The RCA was subtotaled distally with presence of  thrombus.  There were right-to-right collaterals, right-to-left collaterals,  and left-to-right collaterals.  No MR.  EF was 45%.  Inferior wall akinesis  with hypokinesis towards the septum.  At that point, Dr. Joycelyn Rua performed a  PTCA/stent placement to a distal right coronary artery.  Recommendations  were for Plavix for six months.   The patient was not placed on aspirin during his hospitalization secondary  to ASPIRIN allergy.  He was started on Lopressor; however, then reported a  previous allergy (reaction unknown).   The patient remained hospitalized over the next several days and remained in  stable condition.  Plans were made for him to have a Cardiolite performed in  approximately 4-6 weeks to evaluate the LAD lesion.   LABORATORY DATA:  Lab studies during his  hospitalization include hemoglobin  12.5, hematocrit 37.1, platelets 241, white count 7.1.  BUN 15, creatinine  1.0, potassium 3.9.  CK 209 with 6.3 MB fraction on discharge.  His maximum  CK reached 1799 with 358.8 MB fraction with a total troponin of 48.40.  Total cholesterol was 161, HDL 30, LDL 90, triglycerides 207.   DISCHARGE MEDICATIONS:  The patient was discharged to home on the following  medications:  1. Zocor 20 mg 1 p.o. q.h.s.  2. Avapro 75 mg 1 p.o. q.d.  3. Plavix 75 mg 1 p.o. q.d. for six months.  4. Pindolol 5 mg 1 p.o. q.12h.  5. Sublingual nitroglycerin p.r.n. chest pain.  6. Tylenol 325 mg 1-2 q.6h. p.r.n. pain.   ACTIVITY:  No strenuous activity.   DIET:  Remain on a low-fat diet.   WOUND CARE:  Clean  the catheterization site with soap and water.    FOLLOW UP:  He is not to return to work until evaluated in the office.  He  has an appointment on February 23, 2002 at 4 p.m. with the physician assistant  for evaluation.  At that time, we will make an appointment for him to follow  up with Dr. Andee Lineman.                                               Guy Franco    LB/MEDQ  D:  02/08/2002  T:  02/11/2002  Job:  [id number]   cc:   Wilhemina Bonito. Eda Keys., M.D. [facility]   Learta Codding, M.D. [facility]

## 2010-11-16 NOTE — Discharge Summary (Signed)
[place]. [facility]  Patient:    [name] [name], [name] [name]                     MRN: [id number] Adm. Date:  [id number] Disc. Date: [id number] Attending:  Alinda Deem Dictator:   Dorthula Matas, P.A.-C.                           Discharge Summary  DISCHARGE DIAGNOSIS:  End-stage degenerative joint disease of the left hip.  PROCEDURE:  Left total hip arthroplasty.  HISTORY OF PRESENT ILLNESS:  The patient is a 77 year old male with end-stage DJD of the left hip as well as right hip.  The patient wishes his left hip to be replaced after discussing risks versus benefits.  He has failed conservative treatment with anti-inflammatory medicine, physical therapy, use of crutch or cane and has pain that prevents activities of daily living.  It awakens him at night and is otherwise unable to be controlled with conservative measures.  ALLERGIES:  PENICILLIN which causes swelling and rash.  ASPIRIN which causes what sounds like and anaphylactic reaction.  PROCARDIA.  RELAFEN.  MEDICATION:  Avapro 150 mg p.o. q.d.  PAST MEDICAL HISTORY: 1. Childhood diseases. 2. Hypertension. 3. Melanoma.  SOCIAL HISTORY:  No tobacco, no ethanol and no IV drug abuse.  He lives with his wife who is chronically ill.  PAST SURGICAL HISTORY:  Skin cancer removed.  No difficulties with GET.  FAMILY HISTORY:  Mother died at age 75 with positive DJD, anemia and Alzheimers.  Father, he is unsure of.  REVIEW OF SYSTEMS:  The patient has upper and lower dentures, glasses and hearing aide.  He has a history of hemorrhoid difficulties with no other positive responses.  PHYSICAL EXAMINATION:  VITAL SIGNS:  The patient is 5 feet 7 inches, 175 pound male.  Afebrile, pulse 76, respiratory rate 16, blood pressure 158/88.  HEENT:  Atraumatic, normocephalic.  TMs are clear.  Pupils are equal, round and reactive to light and accommodation.  Nose is patent.  Throat is benign.  NECK:   Supple, full range of motion.  CHEST:  Clear to auscultation and percussion.  HEART:  Regular rate and rhythm with no murmurs, rubs or gallops.  ABDOMEN:  Soft, nontender, no masses.  Bowel sounds 2+.  EXTREMITIES:  He has a bilateral Trendelenburg gait with a 15 degree flexion contracture bilaterally, abduction of 5-10 degrees only, internal rotation of 0, external rotation 10-15 degrees.  Pain to all ranges of motion, left greater than right hips.  SKIN:  Intact.  Warm and dry.  LABORATORY DATA AND X-RAY FINDINGS:  X-ray showed bone on bone DJD of both hips.  Preoperative labs including CBC, CMP, chest x-ray, EKG, PT and PTT are all within normal limits.  HOSPITAL COURSE:  On the day of admission, the patient was taken to the operating room at [facility] where he underwent a hybrid lift total hip arthroplasty using Osteonics components with a 54 mm dual geometry no hole cup with a central occluder, 10 degree crossfire liner, index posterior and superior to accept an NK+0, 28 mm head.  A #8 ODD stem and 12 mm tip with a #4 cement restrictor replaced with simplex cement latent with antibiotics.  No drains were placed.  He was placed on perioperative antibiotics for the first 48 hours.  He was placed on postoperative Coumadin. Prophylaxis with a target INR  of 1.5 to 2.  He was placed on postoperative pain PCA control for pain.  Physical therapy was begun postop day #1.  On postop day #1, the patient was awake and alert with nausea and vomiting x 1, but doing well otherwise with PCA.  Vital signs were stable.  The wound was clean and dry.  He easily moves toes up and down, otherwise neurovascularly intact distally to all motors and otherwise stable.  He began physical therapy, weightbearing as tolerated with total hip precautions.  On postop day #2, the patient was without complaint with T-max 101.2 with range to 99.4 with vital signs stable.  WBC of 8.7,  hemoglobin 10.3 with INR of 1.5.  Dressing was dry and wound was benign.  There was a slight blister secondary to the fixator at the edge of the dressing.  He was otherwise stable orthopedically. His PCA was discontinued and he continued physical therapy.  On postop day #3, the patient was awake and alert with decreased pain, no nausea or vomiting. T-max was 100.4 with range to 99.  Dressing was dry and he was otherwise neurovascularly intact.  He continued to make good progress with physical therapy.  Care management had been contacted to review and ensure that home health needs would be taken care of.  Postoperative day #4, the patient was without complaint and felt ready to go.  PT goals had been met.  He was transferring independently and walking distances in the hall without assistance.  He was afebrile, vital signs were stable.  He was alert and oriented.  PT was 27.7 and INR 2.3.  Dressing was dry.  The wound was benign. He was otherwise medically stable and improved.  He was discharged home to the care of his wife and family.  FOLLOWUP:  He should return to clinic in four days time for follow up check and sooner if he is to have any increase in temperature, any drainage from old wound or any increase in pain not controlled by oral pain medications.  DISCHARGE MEDICATIONS: 1. Coumadin. 2. Tylox. 3. Resume Avapro.  DIET:  Regular.  ACTIVITY:  Weightbearing as tolerated with total hip precautions on the left. Dressing changes q.d. DD:  10/27/00 TD:  10/28/00 Job: [id number]

## 2010-11-16 NOTE — H&P (Signed)
NAME:  [name] [name], [name] [name]                        ACCOUNT NO.:  [id number]   MEDICAL RECORD NO.:  [id number]                   PATIENT TYPE:  INP   LOCATION:  1823                                 FACILITY:  [facility]   PHYSICIAN:  Learta Codding, M.D. [facility]             DATE OF BIRTH:  1927-11-09   DATE OF ADMISSION:  DATE OF DISCHARGE:                                HISTORY & PHYSICAL   CURRENT COMPLAINTS:  Substernal chest pain starting two days ago.   HISTORY AND PHYSICAL:  The patient is a 77 year old male with no prior  history of coronary artery disease, who reports a new onset of substernal  chest pain two days ago.  The pain was rather severe and 8/10 located  substernally with some radiation to the left shoulder, associated with some  diaphoresis.  This lasted several hours on Tuesday.  The patient did not  seek medical attention.  On Wednesday, he had no further chest pain and then  earlier this morning was awakened at 7:00 with severe substernal chest pain  similar in nature to the pain two days ago.  This was also associated with  shortness of breath.  He then presented himself to the emergency room where  he was found to have an inferior wall myocardial infarction with ongoing  chest pain.  However, he was hemodynamically stable.  EKG demonstrated ST  elevation in leads 2, 3 and aVF with marked ST elevation in lead 3  suggestive of  the right coronary artery being infarct related artery.  The  patient subsequently was brought to the catheterization laboratory for  primary percutaneous coronary intervention to the infarct related artery.   ALLERGIES:  Aspirin and nonsteroidals.  The patient does not have a  shellfish or iodine allergy.   MEDICATIONS:  Avapro.   PAST MEDICAL HISTORY:  1. History of  hypertension.  2. History of arthritis.  3. Fibromyalgia.  4. History of right hip surgery in 2002.   SOCIAL HISTORY:  The patient lives in [place] with his wife.  He is an  Personnel officer.  He stopped smoking nine years ago.  He occasionally smokes a  pipe.   FAMILY HISTORY:  Noncontributory.   REVIEW OF SYMPTOMS:  Positive for sweats associated with pain.  Positive for  chest pain or shortness of breath but no orthopnea or PND.  No palpitations  or syncope.  No frequency or dysuria.  No weakness or numbness.  No  arthralgias currently.  No nausea or vomiting.   PHYSICAL EXAMINATION:  VITAL SIGNS:  Blood pressure was 120/89, heart rate 72 beats per minute,  respirations 20.  GENERAL:  A 77 year old white male in moderate distress with ongoing chest  pain.  HEENT:  Normocephalic, atraumatic.  PERRLA.  EOMI.  NECK:  Supple.  No lymphadenopathy.  HEART:  Regular rate and rhythm.  Normal S1 and S2.  No S3.  LUNGS:  Clear  without crackles.  SKIN:  No rash or lesions.  GU/RECTAL:  Deferred.  EXTREMITIES:  No cyanosis, clubbing or edema.  MUSCULOSKELETAL:  No joint deformities.  NEUROLOGIC:  The patient is alert and oriented and grossly nonfocal.   Chest x-ray is currently pending.  EKG:  Normal sinus rhythm with heart rate  71 beats per minute, ST elevation 2, 3 and aVF suggestive of the right  coronary artery being infarct related artery.   LABORATORY DATA:  Hemoglobin 14.7, white count 7.3, platelet count 198.  Sodium 138, potassium 3.9, BUN 20, creatinine 1.2, glucose 133.   IMPRESSION/PLAN:  1. Acute inferior wall myocardial infarction, likely with the right coronary     artery being infract related artery (greater ST elevation in 3 than in     2).  The patient was explained his various options.  The plan is to     proceed to percutaneous coronary intervention.  Nitroglycerin, Lopressor     and Heparin were given as well as 300 mg of p.o. Plavix.  The patient has     consented to an emergent cardiac catheterization.  2. Hypertension.  Continue on Avapro pending hemodynamics post intervention.  3. Risk factors.  The patient will likely need to be  started on Zocor.     Cardiac rehab at the appropriate time.                                                 Learta Codding, M.D. [facility]    GED/MEDQ  D:  02/04/2002  T:  02/04/2002  Job:  [id number]

## 2010-11-16 NOTE — H&P (Signed)
NAME:  [name] [name], [name] [name] NO.:  [id number]   MEDICAL RECORD NO.:  [id number]                   PATIENT TYPE:  EMS   LOCATION:  MAJO                                 FACILITY:  [facility]   PHYSICIAN:  Learta Codding, M.D. [facility]             DATE OF BIRTH:  06/10/1934   DATE OF ADMISSION:  09/23/2002  DATE OF DISCHARGE:                                HISTORY & PHYSICAL   CHIEF COMPLAINT:  Substernal chest pain started earlier today.   HISTORY OF PRESENT ILLNESS:  The patient is a 77 year old male well known to  me with coronary artery disease.  The patient presented in August of 2003  with an acute inferior wall myocardial infarction.  At that time, the  patient underwent a cardiac catheterization and was thought to have an  occluded right coronary artery.  He also had an occluded LAD that filled  from collaterals.  His ejection fraction was 45%.  He subsequently underwent  percutaneous transluminal coronary angioplasty performed by Dr. Salvadore Farber with stent placement to the distal right coronary artery.  The  patient had a non-drug alluding stent placed.  He also required AngioJet due  to heavy thrombus burden (Express stent).   The patient subsequently had close follow-up in the office and had a  Cardiolite stress study done six week after his original presentation.  This  reportedly was negative and showed no ischemia in distribution of the LAD.  Today, the patient described substernal chest pain somewhat similar in  nature to his prior presentation but not as severe.  He took one  nitroglycerin at home with some improvement in his symptoms and subsequently  received two more nitroglycerins en route by EMS.  This relived his  substernal chest pain.  However, in the emergency room, he still complains  of a burning sensation going across the chest.  He has no shortness of  breath.  His EKG in the emergency room shows no acute ischemic changes.  His  initial CKs are elevated but MBs and troponins are not available yet.  The  patient does not appear in distress currently.  He relates no recent  orthopnea or PND.  He has been going about his usual activities and has no  signs or symptoms substernal chest pain prior to today.   ALLERGIES:  ASPIRIN and NONSTEROIDALS.  No shellfish or iodine allergies.  However, the patient tolerated catheterization without any difficulties.   PAST MEDICAL HISTORY:  1. Coronary artery disease.  Please see HPIs as outlined above.  2. History of arthritis.  3. Fibromyalgia.  4. History of right hip surgery in 2002.   SOCIAL HISTORY:  The patient lives in [place] with his wife.  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  He occasionally smokes  a pipe.   FAMILY HISTORY:  Noncontributory.   REVIEW OF SYMPTOMS:  As  per HPI.  No nausea, vomiting, fevers, chills,  orthopnea, PND, palpitations, or syncope.   PHYSICAL EXAMINATION:  VITAL SIGNS:  Blood pressure is 111/58, heart rate is  60 beats per minute, temperature is 98.8.  GENERAL:  A well-nourished white male in no acute distress.  HEENT:  Pupils, sclerae, and conjunctivae are clear.  NECK:  Supple.  No carotid upstrokes and no carotid bruits.  LUNGS:  Clear.  HEART:  Regular rate and rhythm.  Normal S1 and S2.  ABDOMEN:  Soft.  EXTREMITIES:  Peripheral pulses are present.  There is no edema.   LABORATORY DATA:  A 12-lead echocardiogram shows normal sinus rhythm.  There  is T-wave inversion in the inferior leads which was present on the prior  tracing and secondary to his old inferior wall myocardial infarction.  Chest  x-ray is pending.   Hemoglobin is 13.2, hematocrit 39.8, white count is 5.8.  Other labs are  pending.  CK is 257.   IMPRESSION:  1. Rule out acute coronary syndrome:  The patient has a known history of     coronary artery disease.  He is status post inferior wall myocardial     infarction and Express  stent placement to the distal right coronary     artery which was a complex intervention requiring AngioJet.  Of note was     the patient also had an occluded left anterior descending artery that     filled by collaterals.  In follow-up, he had a Cardiolite which showed no     ischemia.  I am very suspicious that his current symptoms are consistent     with unstable angina.  Interestingly, his symptoms also started two weeks     after he stopped taking Plavix.  The patient is currently comfortable.     We have discussed at length the need to proceed with a diagnostic cardiac     catheterization.  He is willing to proceed.  The patient will be     scheduled for cardiac catheterization on September 24, 2002 with Dr. Salvadore Farber, who did his prior intervention and who will be in the lab     tomorrow.  2. Hypertension:  This is controlled.  3. Aspirin allergy:  The patient did not receive aspirin after his last     procedure.  He was maintained on Plavix.  In the future, he will need to     be maintained on Plavix long-term and should not be discontinued     particularly in light of his aspirin allergy.                                               Learta Codding, M.D. [facility]    GED/MEDQ  D:  09/23/2002  T:  09/23/2002  Job:  [id number]   cc:   [facility]   Wilhemina Bonito. Marina Goodell, M.D. [facility]

## 2010-11-16 NOTE — Discharge Summary (Signed)
NAME:  [name] [name], [name] [name]                        ACCOUNT NO.:  [id number]   MEDICAL RECORD NO.:  [id number]                   PATIENT TYPE:  INP   LOCATION:  2028                                 FACILITY:  [facility]   PHYSICIAN:  Learta Codding, M.D. [facility]             DATE OF BIRTH:  1928/06/11   DATE OF ADMISSION:  09/23/2002  DATE OF DISCHARGE:  09/24/2002                                 DISCHARGE SUMMARY   DISCHARGE DIAGNOSES:  1. Noncardiac chest pain.  2. History of coronary artery disease.     a. Status post acute inferior wall myocardial infarction August 2003,        treated with percutaneous transluminal coronary angioplasty and stent        placement to distal right coronary artery.     b. Chronically occluded left anterior descending artery by        catheterization, filled with collaterals from the posterior descending        artery.  3. History of arthritis.  4. Fibromyalgia.  5. History of right hip surgery in 2002.  6. Hypertension.  7. Hyperlipidemia.   PROCEDURES:  Cardiac catheterization by Salvadore Farber, M.D. on 09/24/2002  revealing patent RCA stents and coronary angiography with no significant  change since catheterization in August 2003.  PDA and LAD collaterals  remained intact.  He had estimated 60% with mild inferior hypokinesis.   HOSPITAL COURSE:  Please refer to the admission History and Physical  dictated by Lewayne Bunting, M.D.  on 09/23/2002 for details.  Briefly, this 77-  year-old male presented to the emergency room with complaints of substernal  chest discomfort similar to previous symptoms.  He was admitted and placed  on Plavix, heparin, and his beta blocker.  Aspirin was held secondary to  history of ASPIRIN allergy.  The patient ruled out for MI.  D-dimer was  negative.  He went for cardiac catheterization on 09/24/2002 by Dr. Randa Evens.  Please refer to his dictated note for complete details.  The  patient tolerated the procedure well and had  no immediate complications.  He  was ready for discharge on the evening of 09/24/2002.  He will need close  followup with Dr. Andee Lineman in [place].   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  He was treated with Benadryl  without any further effects.   The patient is sent home on empiric Protonix, and he should follow up with  is primary care physician as well within the next one to two weeks.   LABORATORY DATA:  White blood cell count 5800, hemoglobin 14, hematocrit 42,  platelet count 193,000, MCV 90.6.  INR 1.0.  Sodium 140, potassium 4.5,  chloride 105, CO2 27, glucose 96, BUN 21, creatinine 1.1, total bilirubin  0.4, alkaline phosphatase 63, AST 32, ALT 30, total protein 6.1, albumin  3.3,  calcium 8.8.  CK-MB and troponin I negative x 3.  Total cholesterol  107, triglycerides 113, HDL 17, LDL 67.  TSH 1.247.  D-dimer less than 0.22.   Admission chest x-ray:  Borderline cardiomegaly with probable left  ventricular hypertrophy, stable minimal chronic bronchitic changes.    DISCHARGE MEDICATIONS:  1. Zocor 20 mg daily at bedtime.  (The patient was in the CETP study).  2. Avapro 75 mg daily.  3. Pindolol 5 mg b.i.d.  4. Plavix 75 mg daily.  5. Protonix 40 mg daily.  6. Nitroglycerin p.r.n. chest pain.   ACTIVITY:  The patient should do no driving or heavy lifting or exertion for  the next three days.  He should slowly increase his activity as tolerated.   DIET:  Low-fat, low-sodium.   FOLLOW UP:  The patient is to call for any groin swelling, bleeding, or  bruising. He is to follow up with Dr. Andee Lineman in one to two weeks.  He should  follow up with his primary care physician, Dr. Marina Goodell, in one to two weeks.     Tereso Newcomer, P.A.                        Learta Codding, M.D. [facility]    SW/MEDQ  D:  09/24/2002  T:  09/24/2002  Job:  [id number]   cc:   Tama Headings. Marina Goodell, M.D.  [address]   [place]  [place] [address]  Fax: [phone]

## 2010-11-16 NOTE — Consult Note (Signed)
[name] [name], [name] [name]              ACCOUNT NO.:  [id number]   MEDICAL RECORD NO.:  [id number]          PATIENT TYPE:  INP   LOCATION:  1614                         FACILITY:  [facility]   PHYSICIAN:  Thornton Park. Daphine Deutscher, MD  DATE OF BIRTH:  23-Aug-1927   DATE OF CONSULTATION:  01/25/2006  DATE OF DISCHARGE:                                   CONSULTATION   CHIEF COMPLAINT:  Abdominal pain, questionable small bowel obstruction.   HISTORY:  This is a 77 year old white male that I was asked to see by Dr.  Cammie Mcgee L. Lendell Caprice for possible partial small bowel obstruction.  He works  at [employer] at [employer] until about Monday or  Tuesday earlier this week he began having some what he considers dark and  tarry diarrhea.  He is followed by Dr. Dario Guardian and apparently was  seen and had an evaluation including a CT scan done at [facility] yesterday that allegedly showed a partial small bowel obstruction.  He had nausea and vomiting that led to his admission here last night.   During the course of my examination and history and physical review, he  began having uncontrolled liquid stools and flatus.   I reviewed his CT scan down in x-ray as best I could as it was on a CD.  I  saw evidence of genogas in his colon and possibly a transition point.  It  was not obvious for a small bowel obstruction for me.  His symptoms,  however, sounded as if they could represent a colitis.   PHYSICAL EXAMINATION:  He had lower abdominal pain, active bowel sounds with  cramps.  No abdominal palpable hernias.  He has had no prior abdominal  surgery.  He had a history of pneumonia about three weeks ago for which he  received an antibiotic.   LABORATORY DATA:  Includes a hemoglobin of 12.4 with a white count of 9.7.  Chem-7 is normal except for a BUN of 36 and glucose of 107.  His total  bilirubin is 1.4, that was the only thing was elevated.  Albumin is down to  2.7.   IMPRESSION:  Possible partial small bowel obstruction versus Clostridium  difficile colitis.  We will treat with Cipro and Flagyl.  Repeat flat and  upright abdominal x-rays.  Continue nasogastric suction.      Thornton Park Daphine Deutscher, MD  Electronically Signed     MBM/MEDQ  D:  01/25/2006  T:  01/25/2006  Job:  [id number]

## 2010-11-16 NOTE — Op Note (Signed)
[name] [name], [name] [name]              ACCOUNT NO.:  [id number]   MEDICAL RECORD NO.:  [id number]          PATIENT TYPE:  AMB   LOCATION:  ENDO                         [facility]ACILITY[facility]   PHYSICIAN:  Petra Kuba, M.D.    DATE OF BIRTH:  [birth year]   DATE OF PROCEDURE:  07/05/2004  DATE OF DISCHARGE:                                 OPERATIVE REPORT   PROCEDURE:  Colonoscopy.   ENDOSCOPIST:  Petra Kuba, M.D.   INDICATIONS:  The patient with a history of colon polyps, here for repeat  screening.  Consent was signed after risks, benefits, methods and options  thoroughly discussed multiple times in the past.   MEDICATIONS USED:  Demerol 60  mcg, Versed 6 mg.   DESCRIPTION OF PROCEDURE:  Rectal inspection is pertinent for external  hemorrhoids, small.  Digital exam was negative.  The video pediatric  adjustable colonoscope was inserted and was advanced to the cecum.  Significant left-sided diverticula were seen, but no other abnormalities.  Cecum was identified by the appendiceal orifice and the ileocecal valve.  Prep was adequate. This was somewhat concealed, therefore, washing and  suction.  The scope was slowly withdrawn through the colon; other than the  significant left-sided diverticula no polyps, tumors, or masses were seen.  Once back in the rectum the anorectal pulled through and retroflexion  confirmed some small hemorrhoids.   The scope was then readvanced shortly up the left side of the colon.  Air  was suctioned; the scope removed.  The patient tolerated the procedure well.  There were no obvious immediate complication.   ENDOSCOPIC DIAGNOSIS:  1.  Internal and external hemorrhoids.  2.  Significant left-sided diverticula.  3.  Otherwise within normal limits in the cecum.   PLAN:  __________ p.r.n. is doing well medically.  Repeat screen in 5 years.  Return care to Dr. Katrinka Blazing for the customary health care maintenance to  include yearly rectals and guaiacs.      MEM/MEDQ  D:  07/05/2004  T:  07/05/2004  Job:  [id number]   cc:   Dr. Katrinka Blazing

## 2010-11-16 NOTE — Discharge Summary (Signed)
[name], [name]              ACCOUNT NO.:  [id number]   MEDICAL RECORD NO.:  [id number]          PATIENT TYPE:  INP   LOCATION:  1614                         [facility]ACILITY:  [facility]   PHYSICIAN:  Corinna L. Lendell Caprice, MDDATE OF BIRTH:  March 03, 1928   DATE OF ADMISSION:  01/24/2006  DATE OF DISCHARGE:  02/01/2006                                 DISCHARGE SUMMARY   DISCHARGE DIAGNOSES:  1.  Partial small bowel obstruction.  2.  Acute renal failure/prerenal azotemia/dehydration.  3.  Hypertension.  4.  Coronary artery disease with history of stent.  5.  Indeterminant lung lesion on CT scan, recommend followup CT chest in 3      months.   DISCHARGE MEDICATIONS:  1.  Plavix 75 mg a day.  2.  Avapro as previous.  3.  Cipro 500 mg p.o. b.i.d. for 6 more days.  4.  Flagyl 250 mg p.o. q.i.d. for 6 more days.   ACTIVITY:  He may return to work in a week.   He is to follow up with Dr. Matthias Hughs in a month and Dr. Wenda Low in a  week.  Consider repeat CAT scan of the abdomen and pelvis.  Follow up with  Dr. Debbrah Alar in a few months.  Consider repeat CT chest.   CONDITION:  Stable.   PROCEDURES:  None.   CONSULTATIONS:  Dr. Daphine Deutscher and Dr. Matthias Hughs.   DIET:  Should be low-residue.   PERTINENT LABORATORIES:  Multiple C. diff stools, C. diff toxins negative.  CMAT on admission for a BUN of 34, creatinine of 1.4, AFT 45, ALT 52, total  bilirubin 1.4; otherwise, unremarkable.  At discharge, his creatinine is 0.9  and his LFTs are unremarkable, except for a total bilirubin of 1.4.  White  count of admission was 11.8 with 88% neutrophils.  The rest of his CBC was  unremarkable.   SPECIAL STUDIES:  Radiology:  EKG showed normal sinus rhythm.  His chest x-  ray showed bibasilar atelectasis.  Two views of the abdomen showed partial  small bowel obstruction.  Serial abdominal film were done.  CT of the  abdomen and pelvis on January 28, 2006, showed marked thickening of the  terminal ileum,  as well as cecum and appendix.  Worrisome for underlying  inflammatory disease and could represent Crohn's.  Also, a few loculus of  gas near the appendix and cecum, which are indeterminate and cannot exclude  a small interloop abscess with extra luminal air.  Findings raise concern  for a previous inflammation involving the appendix and cecum.  A 3  millimeter indeterminate density along the lingula, recommend followup of  this lesion, although this could represent atelectasis or scarring.  Fluid  filled loops of small and large bowels suggestive for an ileus.  Peripheral  enhancing fluid collection in the pelvis, worrisome for a small abscess  collection, extensive diverticulosis, severe right hip osteoarthritis.   HISTORY AND HOSPITAL COURSE:  [name] [name] is a pleasant 77 year old white  male patient of Dr. Merri Brunette, who was admitted with a small bowel  obstruction.  He had had  some diarrhea and dark stool several days prior to  admission, but had not had a bowel movement for several days.  He had had  vomiting, abdominal pain and distention.  An outpatient CAT scan was done  and initially was unavailable, but the report was obtained and showed  inflammatory process in the lower abdomen with secondary small bowel  obstruction.  The inflammation appears to involve the terminal ileum.  Patient was admitted for IV fluids, pain control, antiemetics.  He had an NG  tube placed.  Please see H&P for complete admission details.  He was  diffusely tender and distended.  Surgery was consulted.  Given the recent  diarrhea and abnormal CAT scan there was concern about C. diff colitis.  He  developed perfuse watery diarrhea the day after admission and several  C.diffs were collected, all of which were negative.  He was started  empirically on Cipro and Flagyl.  Also due to the terminal ileitis, GI was  consulted to evaluate for possible Crohn's.  A repeat CAT scan was done and  results as  above.  Dr. Matthias Hughs felt that this was less likely to be and  inflammatory bowel issue, but was concerned about possible infectious  process, like previous appendicitis, diverticulitis with perforation.  Surgery felt that since patient was improving, an exploratory laparotomy or  percutaneous aspiration was not necessary.  GI agreed.  Patient's diet was  able to be advanced.  At the time of discharge, he was tolerating a low-  residue diet.  Had no pain.  His diarrhea had resolved and he had no nausea.  His abdomen was soft, nontender and nondistended.  At this point, GI  recommends discharge home on empiric antibiotics and follow up with Dr.  Daphine Deutscher to consider followup CT and duration of antibiotics or if whether  surgical exploration is needed.  Dr. Matthias Hughs also recommends follow up with  him in a month, completes medication.      Corinna L. Lendell Caprice, MD  Electronically Signed     CLS/MEDQ  D:  02/01/2006  T:  02/01/2006  Job:  [id number]   cc:   Petra Kuba, M.D.  Dario Guardian, M.D.  Thornton Park Daphine Deutscher, MD

## 2010-11-16 NOTE — Consult Note (Signed)
[name] [name], [name] [name] NO.:  [id number]   MEDICAL RECORD NO.:  [id number]          PATIENT TYPE:  INP   LOCATION:  1614                         FACILITY:  [facility]   PHYSICIAN:  [name] [name], M.D.   DATE OF BIRTH:  1928-01-18   DATE OF CONSULTATION:  01/28/2006  DATE OF DISCHARGE:                                   CONSULTATION   [name] [name] of the [facility] hospitalists asked me to see this 77-year-  old gentleman because of a possible small bowel obstruction.   [name] [name] was admitted to the hospital several days ago with nausea,  vomiting and diarrhea.   Basically, he thinks he has been sick for about 2 weeks, keeping in mind  that he was treated for pneumonia with antibiotics approximately 3 weeks  ago.  His symptoms began with diarrhea which lasted for several days,  characterized by multiple bowel movements during the day and night, probably  at least 4 or 5.  They apparently started out as dark in tarry but  subsequently became more normal in appearance.  This was associated with  some abdominal pain, prompting a CT scan through his primary physician's  office which was performed at [facility] radiology and read as showing a  small bowel obstruction.  After drinking the contrast, he developed vomiting  and his diarrhea resumed, and he was admitted to the hospital several days  ago.  He has been on NG suction with a radiographic appearance suggestive of  a small bowel obstruction and with a C diff toxin negative so far.   At this time, he is free of nausea or vomiting on NG suction which is  putting out scant amounts (for example, 70 mL overnight) and the fluid is  light green in color.  He still has some degree of diffuse abdominal pain.   PAST MEDICAL HISTORY:   ALLERGIES:  AMOXICILLIN, RELAFEN, PROCARDIA, VIOXX, CELEBREX an ASPIRIN. The  latter causing anaphylaxis.   MEDICATIONS:  Outpatient medications are Avapro and Plavix.  Here in the  hospital, the patient is receiving Cipro, Lovenox, Avapro, Flagyl, Protonix.   OPERATIONS:  No surgery whatsoever and in particular no history of intra-  abdominal surgery.   MEDICAL HISTORY:  Negative for GI illnesses and particularly negative for  any prior history of small bowel obstruction. There is a history of  hypertension and recently-treated pneumonia.  In the past, he has had  colonoscopy x2 by Dr. Ewing [name], most recently in December 2006, with an  adenomatous polyp having been removed approximately 5 years ago.  Notably,  biopsies of the terminal ileum at that time 5 years ago were negative for  ileitis.   REVIEW OF SYSTEMS:  Basically negative for any ongoing GI tract symptoms.  The patient has had perhaps some decrease in appetite without any  significant or dramatic weight loss.  No ongoing problems with reflux,  dysphagia, abdominal pain, nausea, vomiting, constipation, diarrhea or  rectal bleeding.   SOCIAL HISTORY:  The patient is originally from [place].  He is  married and lives with his wife.  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   PHYSICAL EXAM:  GENERAL:  A trim but not cachectic appearing Caucasian  gentleman in no evident distress appearing neither anxious nor depressed.  He has been afebrile for the past 48 hours.  VITAL SIGNS:  Unremarkable including pulse and blood pressure.  HEENT:  He is anicteric and without overt pallor although the complexion  does look a little bit pale.  CHEST:  Clear.  HEART:  Normal without gallops, rubs, murmurs, clicks or arrhythmias.  ABDOMEN:  Nondistended.  He does have somewhat groaning bowel sounds  without high-pitched tinkles or rushes.  The abdomen is rather diffusely  tender with some voluntary guarding but the exam is not impressive for  significant for any peritoneal signs.  RECTAL:  Exam shows a normal perhaps slightly enlarged, smooth prostate,  no  masses and an empty ampulla with liquid brown Hemoccult negative stool by  bedside examination (controls reacted appropriately).   LABORATORY DATA:  Admission white count 11,800, had fallen to 8.0 yesterday.  Hemoglobin following hydration was 11.5 with an MCV of 95 and a normal RDW,  platelets normal at 283,000.  INR normal of 1.1, differential count showed  88 polys, 5 lymphs and 6 monocytes on admission. Chemistry panel today is  pertinent for potassium of 3.1.  Initial mild prerenal azotemia with BUN of  34 and creatinine of 1.4 on admission have normalized with IV hydration to  current levels of 5 and 0.8, respectively.  Liver chemistries were minimally  elevated on admission with total bilirubin 1.4 (mostly indirect), AST 45 and  ALT 52, both of which normalized overnight.   IMPRESSION:  I think the main differential diagnosis for this patient is  between a small bowel obstruction and colitis, the latter of which could be  due to toxin negative Clostridium difficile infection.  Such colitis could  cause a reactive small bowel ileus or even a primary C Dif enteritis as well  as colitis, keeping in mind his recent antibiotic exposure.   PLAN:  1.  Update abdominal films.  2..  Stop Cipro since there is no clear evidence that he has an infectious  process going on and since it could either cause or worsen a problem with  diarrhea and/or Clostridium difficile infection (the patient has had some  loose stools and urge fecal incontinence since admission).  1.  Supplement potassium since the patient's potassium level today is low at      3.1, since he is not eating and is on NG suction, and may have an      element of the ileus.  2.  Consider possible sigmoidoscopic evaluation tomorrow, which could be      done unprepped, depending on his clinical and radiographic evolution in      the meantime.          ______________________________  [name] [name], M.D.     RB/MEDQ  D:   01/28/2006  T:  01/28/2006  Job:  [id number]   cc:   [name] [name], M.D.  Fax: [phone]   [name] [name], M.D.  Fax: [phone]

## 2010-11-16 NOTE — Cardiovascular Report (Signed)
NAME:  [name] [name], [name] [name] NO.:  [id number]   MEDICAL RECORD NO.:  [id number]                   PATIENT TYPE:  INP   LOCATION:  2028                                 FACILITY:  [facility]   PHYSICIAN:  Salvadore Farber, M.D. [facility]         DATE OF BIRTH:  May 01, 1928   DATE OF PROCEDURE:  09/24/2002  DATE OF DISCHARGE:  09/24/2002                              CARDIAC CATHETERIZATION   PROCEDURES:  Left heart catheterization, left ventriculography, coronary  angiography, intravascular ultrasound of the circumflex for the CETP study.   INDICATIONS:  The patient is a 77 year old gentleman status post inferior  myocardial infarction in August 2003, treated with stenting of his distal  RCA.  He now presents with substernal chest burning of 1 day's duration,  having been stable previously.  He ruled out for myocardial infarction.  He  is brought to the lab for diagnostic angiography.   DIAGNOSTIC TECHNIQUE:  Informed consent was obtained.  The patient consented  to participate in the CETP IVUS study.  Under 1% lidocaine local anesthesia,  a 6-French sheath was placed in the right femoral artery using the modified  Seldinger technique.  Diagnostic angiography was performed using JL4 and JR4  catheters.  Ventriculography was performed in the RAO projection using a  pigtail catheter.  Findings are described above.  The case then proceeded to  IVUS.   Anticoagulation was initiated with adjunctive heparin to achieve an ACT of  greater than 250 seconds.  A CLS 3.5 guide was advanced over a wire and  engaged in the ostium of the left main coronary.  A BMW wire was advanced  into the distal portion of the large first obtuse marginal branch.  Then 200  mcg of intracoronary nitroglycerin were administered.  Intravascular  ultrasound was then performed using automated pullback as part of the CETP  study.  The wire was then removed.  Final angiograms demonstrated no change  in  the coronary anatomy, with no evidence of dissection.   Finally, the arteriotomy was closed using a 6-French Perclose device.  The  patient tolerated the procedure well and was transferred to the holding room  in stable condition with complete hemostasis of the arteriotomy.   COMPLICATIONS:  None.   FINDINGS:  1. LV:  EF of approximately 60%.  Mild inferior hypokinesis.  2. Left main:  There is a 20% stenosis of the distal left main.  3. LAD:  The LAD is a large vessel giving rise to a single large first     diagonal branch.  The vessel is occluded immediately after the takeoff of     this diagonal.  The first diagonal had approximately a 50% sequential     lesion.  4. Circumflex:  The circumflex is a large vessel giving rise to a single     large obtuse marginal branch.  There is approximately 30% disease in the     proximal  circumflex.  The proximal portion of the first OM has a 40%     lesion.  5. RCA:  The RCA is a large vessel.  The stent in the distal vessel remains     widely patent with no significant in-stent restenosis.  There is a 40%     stenosis proximal to the takeoff of the stent.  The RCA collateralizes     the distal LAD via septal perforators.    IMPRESSION AND RECOMMENDATIONS:  There has been no significant change in his  coronary anatomy compared with August 2003.  The right coronary artery stent  remains widely patent, and the posterior descending artery to left anterior  descending collaterals remain, and we will therefore continued medical  therapy as I suspect that his chest pain was noncardiac in etiology.                                               Salvadore Farber, M.D. [facility]    WED/MEDQ  D:  09/24/2002  T:  09/26/2002  Job:  [id number]   cc:   Tama Headings. Marina Goodell, M.D.  [address]  [place]  [place] [address]  Fax: [phone]   Learta Codding, M.D. [facility]  [address]  [address]  [place]  [place] [address]

## 2010-11-16 NOTE — H&P (Signed)
[name] [name], [name] [name] NO.:  [id number]   MEDICAL RECORD NO.:  [id number]          PATIENT TYPE:  EMS   LOCATION:  ED                           [facility]AC[facility]   PHYSICIAN:  Michelene Gardener, MD    DATE OF BIRTH:  Jul 08, 1927   DATE OF ADMISSION:  01/24/2006  DATE OF DISCHARGE:                                HISTORY & PHYSICAL   PRIMARY PHYSICIAN:  Theatre stage manager.   CHIEF COMPLAINT:  Abdominal pain with nausea and vomiting.  This started on  Monday.   HISTORY OF PRESENT ILLNESS:  This is a 77 year old Caucasian male with a  past medical history of hypertension who presented with the above mentioned  complaint.  The patient stated that starting on Monday he started having  some abdominal discomfort.  The abdominal pain was diffuse, not radiating,  about 3-5/10 associated with nausea and vomiting.  He vomited many times  today, and that why he went to see his primary physician.  A CT scan of the  abdomen was done as an outpatient, and it showed a small bowel obstruction  so he was sent to the ER for further evaluation.  Currently, he has been  having a small amount of abdominal pain which is about 3-5/10 associated  with some nausea.  The last time he had vomiting was around 4 hours ago.  In  the ER, an NG tube was placed on  him.  He also had some distention which  improved after the NG tube placement.   PAST MEDICAL HISTORY:  1. Hypertension.  2. History of pneumonia.   PAST SURGICAL HISTORY:  Denied.   MEDICATIONS:  1. Avapro unknown dose.  2. Plavix, unknown dose.   ALLERGIES:  The patient is allergic to PENICILLIN.  Allergic to ASPIRIN  where he gets a rash from that.   SOCIAL HISTORY:  He denied smoking, denied alcohol.  He denied recreational  drugs.  He lives with a relative.   FAMILY HISTORY:  Significant for hypertension.   REVIEW OF SYSTEMS:  CONSTITUTIONAL:  There is no fever, no fatigability and  no weight changes.  EYES:  No blurred  vision.  No pain.  ENT:  No difficulty  swallowing, no epistaxis.  CARDIOVASCULAR:  No chest pain, no shortness of  breath.  RESPIRATORY:  No cough, no wheezes.  No hemoptysis.  GASTROINTESTINAL:  Positive for nausea, vomiting, abdominal pain and  abdominal distention.  GENITOURINARY:  No hematuria and no dysuria.  No  polyuria.  INFECTIOUS DISEASE:  No rash, no lesions.  HEMATOLOGY:  No  bruises, no bleeding.  NEUROLOGIC:  No numbness, no tingling, no weakness.  The rest of his systems were reviewed, and they were negative.   PHYSICAL EXAMINATION:  VITAL SIGNS:  Temperature 96.6, blood pressure is  104/68, pulse 84, respiratory rate is 18, blood pressure is 104/68.  GENERAL:  This is an elderly, Caucasian male who is lying in bed in no acute  distress at the present time.  HEENT:  Conjunctivae is normal with no palor or erythema.  Pupils are equal  and  reactive to light and accommodation.  There is no ptosis.  Hearing is  intact.  There is no ear discharge or infection.  There is no nasal  discharge, infection or bleeding.  Oral mucosa is moist.  There is no  pharyngeal erythema.  NECK:  Supple.  No JVD, no carotid bruits.  No lymphadenopathy or thyroid  enlargement, no thyroid tenderness.  CARDIOVASCULAR:  S1, S2 with heaves.  There are no abnormal heart sounds, no  murmurs, gallops and no thrills.  RESPIRATORY:  The patient is breathing between 16-18.  No use of accessory  muscles.  No intercostal retractions, no rhonchi, no wheezes.  ABDOMEN:  The abdomen is a little bit firm.  There is mild distention.  There is diffuse tenderness.  To deep palpation.  No hepatosplenomegaly.  Bowel sounds are decreased.  EXTREMITIES:  Lower extremities no edema, no  rash.  SKIN:  No rash or erythema.  NEUROLOGICAL:  Cranial nerves intact II-XII.  There are no motor or sensory  deficits.  PSYCHIATRIC:  The patient is alert, awake and oriented x3.  He has some  memory impairment, and he is anxious  about his current condition.   LABORATORY RESULTS:  WBC is 11.8, hemoglobin 15.1, hematocrit 45.3, platelet  count is 291,000.  Sodium 152, potassium 4.2, chloride 96, bicarbonate 22,  glucose 118, BUN 34, creatinine 1.4, calcium is 9.7.  AST 45, ALT 52,  alkaline phosphatase is 82.   ASSESSMENT:  1. Small-bowel obstruction.  On admission, the patient presented with      symptoms compatible with obstruction which include abdominal pain,      nausea, vomiting and abdominal distention.  His exam is also suggestive      of abdominal distention with abdominal tenderness, decreased bowel      sounds.  His CT scan of the abdomen that was done as an outpatient      confirmed the diagnosis, which is partial small-bowel obstruction  We will keep him NPO.  We will start him no IV fluids.  We will begin him on  some pain medications as well as Protonix 40 mg IV once daily.  He has  already had NG tube placed.  We will keep that.  We will get a repeat  abdominal x-ray which will be erect and flat x-ray in the a.m.  We will also  ask for surgical consultation in a.m.  1. Acute renal failure that was contributed to prerenal azotemia      contributing to his vomiting and dehydration.  We will give him IV      fluids and will repeat his basic metabolic panel.  .  2. Hyponatremia, could be associated with his vomiting.  We will give him      normal saline and we will repeat his sodium level.  3. Leukocytosis, again secondary to dehydration and no evidence of      infection at the present time.  We will give him IV fluids and repeat      his WBC count.  4. Elevated liver function tests.  We will repeat his liver function      tests.  If this remains elevated then we will consider hepatitis      profile.  5. Hypertension.  We will continue his current medications.   Length of assessment is 50 minutes.      Michelene Gardener, MD Electronically Signed     NAE/MEDQ  D:  01/24/2006  T:  01/24/2006  Job:  [id number]

## 2010-11-16 NOTE — Cardiovascular Report (Signed)
NAME:  [name] [name], [name] [name]                        ACCOUNT NO.:  [id number]   MEDICAL RECORD NO.:  [id number]                   PATIENT TYPE:  INP   LOCATION:  4706                                 FACILITY:  [facility]   PHYSICIAN:  Learta Codding, M.D. [facility]             DATE OF BIRTH:  13-Jul-1927   DATE OF PROCEDURE:  02/04/2002  DATE OF DISCHARGE:  02/08/2002                              CARDIAC CATHETERIZATION   PROCEDURE PERFORMED:  1. Left heart catheterization with selective coronary angiography.  2. Ventriculography.   DIAGNOSES:  1. Two-vessel coronary artery disease with occluded left anterior     descending.  The right coronary artery is the infarction-related artery     (inferior wall myocardial infarction/acute).  2. Left ventricular systolic dysfunction.   INDICATIONS FOR PROCEDURE:  The patient is a 77 year old male with no prior  history of coronary artery disease.  The patient was admitted today to the  emergency room with chest pain which started at 7:00 a.m. this morning.  EKG  revealed acute inferior wall myocardial infarction.  The patient was brought  to the catheterization laboratory to assess his coronary anatomy, and  proceed with possible primary angioplasty of the infarction-related artery.   DESCRIPTION OF PROCEDURE:  After informed consent was obtained, the patient  was brought to the catheterization laboratory.  [name] right groin  was sterilely prepped and draped.  A 7-French arterial sheath was placed  using modified Seldinger technique.  Subsequently, JL-4 and JR-4 catheters  were used to engage the left and right coronary arteries, respectively.  Selective coronary angiography was performed in various projections using  manual injection of contrast.  Ventriculography was then performed with a 6-  French angled pigtail catheter.  Left-sided hemodynamics were obtained.  Ventriculography was then performed with single plane RAO projection using  power  injection of contrast.   Following this, the pigtail catheter was removed, but the 7-French sheath  was left in place, and the procedure was turned over to Dr. Samule Ohm to  proceed with intervention to the right coronary artery.   FINDINGS:  1. Hemodynamics:  Left ventricular pressure was 123/20 mmHg, aortic pressure     123/70 mmHg.  2. Ventriculography:  Mild global hypokinesis.  Akinesis of the inferior     wall extending from the base to the mid ventricle, and hypokinesis     extending to the apex.  There was no mitral regurgitation noted.     Ejection fraction was estimated at about 45%.  3. Selective coronary angiography:     A. Left main coronary artery was a large caliber vessel which did not        demonstrate evidence of flow limiting coronary artery disease.     B. Left anterior descending was occluded after the first diagonal branch        which itself was a very large vessel.  The first septal perforator was  also proximal to the left anterior descending stenosis.  The first        diagonal had approximately 50-60% sequential lesions.     C. Circumflex coronary artery was a large caliber vessel which        demonstrated diffuse disease in the proximal segment, approximately 30-        40%.  The first obtuse marginal branch had a more focal 40% lesion.        The remainder of the circumflex obtuse marginal branches were free of        flow-limiting disease.     D. Right coronary artery demonstrated a subtotal occlusion in the distal        vessel, heavily filled with thrombus.  The posterior descending artery        was not visualized and appeared to also have thrombus.  There was        right-to-right collaterals present.  There was also more focal lesion        in the posterolateral branch approximately 70%.  In addition, there        were collaterals from the proximal right coronary artery to the left        anterior descending with visualization of the left anterior  descending        during injection of the right coronary artery.  The left anterior        descending proper is a large caliber vessel which actually fills        poorly with collateral flow.  In addition, there were left-to-right        collaterals from the first diagonal branch to the posterolateral        branches of the right coronary artery.   RECOMMENDATIONS:  Angiographic data was reviewed with Dr. Samule Ohm.  The  findings are to proceed with percutaneous coronary intervention to the right  coronary artery.  Despite the fact that the left anterior descending is  occluded, the patient's left ventricular function is only mildly decreased  at 45%, and the patient hemodynamically not in shock.  This is likely due to  extensive collateralization from right-to-left with some preserved flow in  the left anterior descending.                                               Learta Codding, M.D. [facility]    GED/MEDQ  D:  02/04/2002  T:  02/09/2002  Job:  [id number]   cc:   Tama Headings. Marina Goodell, M.D.

## 2010-11-16 NOTE — Cardiovascular Report (Signed)
NAME:  [name] [name], TRIM NO.:  [id number]   MEDICAL RECORD NO.:  [id number]                   PATIENT TYPE:  INP   LOCATION:  1823                                 FACILITY:  [facility]   PHYSICIAN:  Salvadore Farber, MD [facility]            DATE OF BIRTH:  Jan 12, 1928   DATE OF PROCEDURE:  DATE OF DISCHARGE:                              CARDIAC CATHETERIZATION   PROCEDURE:  Temporary pacing wire, AngioJet thrombectomy of the right  coronary artery, stent to the right coronary artery.   TECHNIQUE:  Informed consent was obtained.  The procedure immediately  followed the diagnostic procedure performed by Dr. Vernie Shanks. DeGent.  Heparin  had been given in the emergency room.  An ACT drawn at the conclusion of the  diagnostic procedure with the heparin off demonstrated an ACT of 322  seconds, which was confirmed.  Due to this marked elevation in ACT, IIb/IIIa  antagonist was withheld.  A 6-French sheath was placed in the right femoral  vein using modified Seldinger technique.  The patient had consented to  participate in the AIMI Trial.  He was randomized to the AngioJet device.  Therefore, a temporary pacing wire was advanced via the venous sheath and  positioned in the apex of the right ventricle.  Capture was confirmed.   A 7-French JR4 guide with sideholes was then advanced over a wire and  engaged into the ostium of the right coronary artery.  A BMW wire was  advanced across the lesion into the posterior left ventricular branch  without difficulty.  An AngioJet thrombectomy catheter was then advanced  beyond the lesion.  Three passes done while moving the device from distal to  proximal were then performed.  These substantially reduced the thrombus  burden and improved flow from TIMI-1 to TIMI-3, however, the stenosis  remained at approximately 90%.   We then proceeded to predilate the lesion with a 2.5 x 15.0-mm Quantum at 8  atmospheres.  At approximately this  time, the patient developed  supraventricular tachycardia at 130 beats per minute; this was well-  tolerated hemodynamically, however, out of concern for an increased ischemic  burden, I elected to break the SVT by administering adenosine 6 mg with good  effect.  Shortly thereafter, the patient developed a sustained junctional  rhythm associated with hypotension to approximately 75 mmHg systolic blood  pressure.  Dopamine was thus begun, with improvement in his blood pressure.  He had several further brief episodes of supraventricular tachycardia which  did not require further adenosine.   The lesion was then stented with a 2.75 x 16.0-mm Express II delivered at 14  atmospheres.  After stent implantation, there was a new severe lesion in the  posterior left ventricular branch, substantially distal to the lesion; this  resolved with 200 mcg of intracoronary nitroglycerin.   We then sought to predilate the proximal margin of the stent.  A 3.0  x 8.0-  mm Quantum was inflated to 8 atmospheres at the proximal stent edge.  As  flow improved, the proximal vessel substantially dilated.  The proximal and  midportion of the stent were thus post-dilated using a 3.5 x 12.0-mm Quantum  at 12 atmospheres in the proximal stent and 18 atmospheres in the mid-stent.  Wire and pacing wire were then removed.  There was approximately 10%  residual stenosis in the midportion of the stent at the site of the original  lesion.  There was TIMI-3 flow to both the PDA and PLV.  Collateral flow to  the LAD territory via septal perforators from the PDA was reestablished.   COMPLICATIONS:  1. Supraventricular tachycardia requiring adenosine to terminate.  2. Transient hypotension requiring dopamine.   The patient left the lab in normal sinus rhythm, off dopamine, with systolic  blood pressure greater than 100 mmHg.   IMPRESSION/PLAN:  Successful AngioJet thrombectomy and stenting of the  distal right coronary  artery resulting in 10% residual stenosis and TIMI-3  flow, with reestablishment of collaterals to the left anterior descending.  He will be transferred to the cardiac intensive care unit.  He is allergic  to aspirin, which has resulted in angioedema in the past; this will  therefore not be given.  He received Plavix in the emergency room; this will  be continued for six months.  No IIb/IIIa antagonist was given during the  case due to an elevated activated clotting time at the initiation of the  interventional procedure; none will be started now.  He will be continued on  his angiotensin receptor blocker as his blood pressure tolerates; similarly,  he will be given beta blocker as heart rate and blood pressure tolerate.  His sheath will be removed when his activated clotting time is less than 175  seconds.                                               Salvadore Farber, MD [facility]    WED/MEDQ  D:  02/04/2002  T:  02/09/2002  Job:  [id number]   cc:   Tama Headings. Marina Goodell, M.D.

## 2011-05-31 ENCOUNTER — Other Ambulatory Visit: Payer: Self-pay | Admitting: *Deleted

## 2011-05-31 MED ORDER — BENAZEPRIL HCL 40 MG PO TABS: 40.0000 mg | ORAL_TABLET | Freq: Every day | ORAL | Status: DC

## 2011-08-30 ENCOUNTER — Other Ambulatory Visit: Payer: Self-pay | Admitting: Cardiology

## 2011-12-17 ENCOUNTER — Other Ambulatory Visit: Payer: Self-pay | Admitting: *Deleted

## 2011-12-17 MED ORDER — ROSUVASTATIN CALCIUM 5 MG PO TABS: 5.0000 mg | ORAL_TABLET | Freq: Every day | ORAL | Status: DC

## 2011-12-17 MED ORDER — CLOPIDOGREL BISULFATE 75 MG PO TABS: 75.0000 mg | ORAL_TABLET | Freq: Every day | ORAL | Status: DC

## 2011-12-17 MED ORDER — ATENOLOL 50 MG PO TABS: 50.0000 mg | ORAL_TABLET | Freq: Every day | ORAL | Status: DC

## 2012-01-27 ENCOUNTER — Other Ambulatory Visit: Payer: Self-pay | Admitting: Cardiology

## 2013-07-01 HISTORY — PX: OTHER SURGICAL HISTORY: SHX169

## 2013-12-03 ENCOUNTER — Ambulatory Visit: Payer: Self-pay | Admitting: Family Medicine

## 2014-01-29 DIAGNOSIS — I82402 Acute embolism and thrombosis of unspecified deep veins of left lower extremity: Secondary | ICD-10-CM

## 2014-01-29 DIAGNOSIS — I2699 Other pulmonary embolism without acute cor pulmonale: Secondary | ICD-10-CM

## 2014-01-29 HISTORY — DX: Other pulmonary embolism without acute cor pulmonale: I26.99

## 2014-01-29 HISTORY — DX: Acute embolism and thrombosis of unspecified deep veins of left lower extremity: I82.402

## 2014-02-04 ENCOUNTER — Inpatient Hospital Stay: Payer: Self-pay | Admitting: Internal Medicine

## 2014-02-04 DIAGNOSIS — N183 Chronic kidney disease, stage 3 unspecified: Secondary | ICD-10-CM

## 2014-02-04 DIAGNOSIS — I10 Essential (primary) hypertension: Secondary | ICD-10-CM

## 2014-02-04 DIAGNOSIS — I214 Non-ST elevation (NSTEMI) myocardial infarction: Secondary | ICD-10-CM

## 2014-02-04 LAB — BASIC METABOLIC PANEL
ANION GAP: 8 (ref 7–16)
BUN: 21 mg/dL — ABNORMAL HIGH (ref 7–18)
CALCIUM: 9 mg/dL (ref 8.5–10.1)
CHLORIDE: 103 mmol/L (ref 98–107)
CREATININE: 1.78 mg/dL — AB (ref 0.60–1.30)
Co2: 24 mmol/L (ref 21–32)
EGFR (Non-African Amer.): 35 — ABNORMAL LOW
GFR CALC AF AMER: 40 — AB
GLUCOSE: 131 mg/dL — AB (ref 65–99)
Osmolality: 275 (ref 275–301)
POTASSIUM: 4.9 mmol/L (ref 3.5–5.1)
Sodium: 135 mmol/L — ABNORMAL LOW (ref 136–145)

## 2014-02-04 LAB — CBC
HCT: 45.4 % (ref 40.0–52.0)
HGB: 14.5 g/dL (ref 13.0–18.0)
MCH: 30.5 pg (ref 26.0–34.0)
MCHC: 32 g/dL (ref 32.0–36.0)
MCV: 95 fL (ref 80–100)
PLATELETS: 172 10*3/uL (ref 150–440)
RBC: 4.76 10*6/uL (ref 4.40–5.90)
RDW: 13.1 % (ref 11.5–14.5)
WBC: 12.3 10*3/uL — ABNORMAL HIGH (ref 3.8–10.6)

## 2014-02-04 LAB — CK-MB
CK-MB: 10.7 ng/mL — AB (ref 0.5–3.6)
CK-MB: 8.5 ng/mL — ABNORMAL HIGH (ref 0.5–3.6)
CK-MB: 8.3 ng/mL — AB (ref 0.5–3.6)

## 2014-02-04 LAB — APTT: ACTIVATED PTT: 30.1 s (ref 23.6–35.9)

## 2014-02-04 LAB — TROPONIN I
TROPONIN-I: 1.5 ng/mL — AB
TROPONIN-I: 0.99 ng/mL — AB
Troponin-I: 1.2 ng/mL — ABNORMAL HIGH

## 2014-02-04 LAB — PROTIME-INR
INR: 1
PROTHROMBIN TIME: 13.5 s (ref 11.5–14.7)

## 2014-02-04 LAB — HEPARIN LEVEL (UNFRACTIONATED): ANTI-XA(UNFRACTIONATED): 0.34 [IU]/mL (ref 0.30–0.70)

## 2014-02-05 DIAGNOSIS — I359 Nonrheumatic aortic valve disorder, unspecified: Secondary | ICD-10-CM

## 2014-02-05 LAB — CBC WITH DIFFERENTIAL/PLATELET
BASOS ABS: 0.1 10*3/uL (ref 0.0–0.1)
Basophil %: 0.8 %
Eosinophil #: 0.2 10*3/uL (ref 0.0–0.7)
Eosinophil %: 2.2 %
HCT: 40.6 % (ref 40.0–52.0)
HGB: 13.7 g/dL (ref 13.0–18.0)
LYMPHS ABS: 2.3 10*3/uL (ref 1.0–3.6)
Lymphocyte %: 21.9 %
MCH: 31.6 pg (ref 26.0–34.0)
MCHC: 33.7 g/dL (ref 32.0–36.0)
MCV: 94 fL (ref 80–100)
MONOS PCT: 8.2 %
Monocyte #: 0.9 x10 3/mm (ref 0.2–1.0)
NEUTROS PCT: 66.9 %
Neutrophil #: 7 10*3/uL — ABNORMAL HIGH (ref 1.4–6.5)
PLATELETS: 139 10*3/uL — AB (ref 150–440)
RBC: 4.33 10*6/uL — AB (ref 4.40–5.90)
RDW: 13.4 % (ref 11.5–14.5)
WBC: 10.5 10*3/uL (ref 3.8–10.6)

## 2014-02-05 LAB — HEPARIN LEVEL (UNFRACTIONATED)
ANTI-XA(UNFRACTIONATED): 0.36 [IU]/mL (ref 0.30–0.70)
Anti-Xa(Unfractionated): 0.27 IU/mL — ABNORMAL LOW (ref 0.30–0.70)

## 2014-02-05 LAB — BASIC METABOLIC PANEL
ANION GAP: 8 (ref 7–16)
BUN: 21 mg/dL — ABNORMAL HIGH (ref 7–18)
CALCIUM: 8.9 mg/dL (ref 8.5–10.1)
CHLORIDE: 105 mmol/L (ref 98–107)
Co2: 24 mmol/L (ref 21–32)
Creatinine: 1.57 mg/dL — ABNORMAL HIGH (ref 0.60–1.30)
GFR CALC AF AMER: 47 — AB
GFR CALC NON AF AMER: 40 — AB
GLUCOSE: 108 mg/dL — AB (ref 65–99)
Osmolality: 277 (ref 275–301)
Potassium: 4.3 mmol/L (ref 3.5–5.1)
Sodium: 137 mmol/L (ref 136–145)

## 2014-02-05 LAB — D-DIMER(ARMC): D-Dimer: 3147 ng/ml

## 2014-02-05 LAB — PRO B NATRIURETIC PEPTIDE: B-Type Natriuretic Peptide: 9539 pg/mL — ABNORMAL HIGH (ref 0–450)

## 2014-02-06 LAB — CBC WITH DIFFERENTIAL/PLATELET
BASOS PCT: 1 %
Basophil #: 0.1 10*3/uL (ref 0.0–0.1)
Eosinophil #: 0.2 10*3/uL (ref 0.0–0.7)
Eosinophil %: 2.6 %
HCT: 42 % (ref 40.0–52.0)
HGB: 13.8 g/dL (ref 13.0–18.0)
LYMPHS ABS: 2.2 10*3/uL (ref 1.0–3.6)
LYMPHS PCT: 23.9 %
MCH: 31.1 pg (ref 26.0–34.0)
MCHC: 32.8 g/dL (ref 32.0–36.0)
MCV: 95 fL (ref 80–100)
Monocyte #: 0.8 x10 3/mm (ref 0.2–1.0)
Monocyte %: 8.4 %
NEUTROS ABS: 6 10*3/uL (ref 1.4–6.5)
Neutrophil %: 64.1 %
PLATELETS: 161 10*3/uL (ref 150–440)
RBC: 4.44 10*6/uL (ref 4.40–5.90)
RDW: 13.4 % (ref 11.5–14.5)
WBC: 9.3 10*3/uL (ref 3.8–10.6)

## 2014-02-06 LAB — BASIC METABOLIC PANEL
Anion Gap: 10 (ref 7–16)
BUN: 20 mg/dL — ABNORMAL HIGH (ref 7–18)
CALCIUM: 8.1 mg/dL — AB (ref 8.5–10.1)
CO2: 25 mmol/L (ref 21–32)
CREATININE: 1.66 mg/dL — AB (ref 0.60–1.30)
Chloride: 104 mmol/L (ref 98–107)
EGFR (African American): 44 — ABNORMAL LOW
GFR CALC NON AF AMER: 38 — AB
Glucose: 104 mg/dL — ABNORMAL HIGH (ref 65–99)
OSMOLALITY: 280 (ref 275–301)
POTASSIUM: 3.6 mmol/L (ref 3.5–5.1)
SODIUM: 139 mmol/L (ref 136–145)

## 2014-02-06 LAB — HEPARIN LEVEL (UNFRACTIONATED): Anti-Xa(Unfractionated): 0.35 IU/mL (ref 0.30–0.70)

## 2014-02-06 LAB — PROTIME-INR
INR: 1
PROTHROMBIN TIME: 12.9 s (ref 11.5–14.7)

## 2014-02-06 LAB — ELECTROLYTE PANEL
ANION GAP: 10 (ref 7–16)
CHLORIDE: 104 mmol/L (ref 98–107)
Co2: 23 mmol/L (ref 21–32)
Potassium: 4.2 mmol/L (ref 3.5–5.1)
Sodium: 137 mmol/L (ref 136–145)

## 2014-02-07 ENCOUNTER — Encounter: Payer: Self-pay | Admitting: Internal Medicine

## 2014-02-07 LAB — HEPARIN LEVEL (UNFRACTIONATED): ANTI-XA(UNFRACTIONATED): 0.4 [IU]/mL (ref 0.30–0.70)

## 2014-02-07 LAB — ELECTROLYTE PANEL
ANION GAP: 12 (ref 7–16)
Chloride: 103 mmol/L (ref 98–107)
Co2: 22 mmol/L (ref 21–32)
POTASSIUM: 4.3 mmol/L (ref 3.5–5.1)
SODIUM: 137 mmol/L (ref 136–145)

## 2014-02-07 LAB — CBC WITH DIFFERENTIAL/PLATELET
BASOS PCT: 0.1 %
Basophil #: 0 10*3/uL (ref 0.0–0.1)
EOS PCT: 0 %
Eosinophil #: 0 10*3/uL (ref 0.0–0.7)
HCT: 40.6 % (ref 40.0–52.0)
HGB: 13.5 g/dL (ref 13.0–18.0)
LYMPHS ABS: 0.9 10*3/uL — AB (ref 1.0–3.6)
Lymphocyte %: 9.9 %
MCH: 31.3 pg (ref 26.0–34.0)
MCHC: 33.3 g/dL (ref 32.0–36.0)
MCV: 94 fL (ref 80–100)
MONO ABS: 0.2 x10 3/mm (ref 0.2–1.0)
Monocyte %: 2 %
NEUTROS PCT: 88 %
Neutrophil #: 8.2 10*3/uL — ABNORMAL HIGH (ref 1.4–6.5)
PLATELETS: 191 10*3/uL (ref 150–440)
RBC: 4.32 10*6/uL — AB (ref 4.40–5.90)
RDW: 13.2 % (ref 11.5–14.5)
WBC: 9.3 10*3/uL (ref 3.8–10.6)

## 2014-02-07 LAB — PROTIME-INR
INR: 1.2
PROTHROMBIN TIME: 14.8 s — AB (ref 11.5–14.7)

## 2014-02-08 LAB — CBC WITH DIFFERENTIAL/PLATELET
BASOS ABS: 0 10*3/uL (ref 0.0–0.1)
Basophil %: 0.1 %
Eosinophil #: 0 10*3/uL (ref 0.0–0.7)
Eosinophil %: 0 %
HCT: 39.7 % — AB (ref 40.0–52.0)
HGB: 12.7 g/dL — ABNORMAL LOW (ref 13.0–18.0)
LYMPHS PCT: 7.6 %
Lymphocyte #: 1.2 10*3/uL (ref 1.0–3.6)
MCH: 30.3 pg (ref 26.0–34.0)
MCHC: 32 g/dL (ref 32.0–36.0)
MCV: 95 fL (ref 80–100)
MONOS PCT: 3.4 %
Monocyte #: 0.5 x10 3/mm (ref 0.2–1.0)
NEUTROS ABS: 14.2 10*3/uL — AB (ref 1.4–6.5)
Neutrophil %: 88.9 %
Platelet: 197 10*3/uL (ref 150–440)
RBC: 4.18 10*6/uL — ABNORMAL LOW (ref 4.40–5.90)
RDW: 12.9 % (ref 11.5–14.5)
WBC: 15.9 10*3/uL — AB (ref 3.8–10.6)

## 2014-02-08 LAB — PROTIME-INR
INR: 2
PROTHROMBIN TIME: 22.6 s — AB (ref 11.5–14.7)

## 2014-02-08 LAB — HEPARIN LEVEL (UNFRACTIONATED): Anti-Xa(Unfractionated): 0.4 IU/mL (ref 0.30–0.70)

## 2014-02-09 LAB — PROTIME-INR
INR: 2.6
Prothrombin Time: 27.3 secs — ABNORMAL HIGH (ref 11.5–14.7)

## 2014-02-09 LAB — CULTURE, BLOOD (SINGLE)

## 2014-02-09 LAB — HEPARIN LEVEL (UNFRACTIONATED)

## 2014-02-10 ENCOUNTER — Encounter: Payer: Self-pay | Admitting: Internal Medicine

## 2014-04-12 ENCOUNTER — Encounter (HOSPITAL_COMMUNITY): Payer: Commercial Managed Care - HMO

## 2014-04-14 ENCOUNTER — Ambulatory Visit: Payer: Self-pay | Admitting: Family Medicine

## 2014-07-14 DIAGNOSIS — N184 Chronic kidney disease, stage 4 (severe): Secondary | ICD-10-CM | POA: Diagnosis not present

## 2014-07-17 DIAGNOSIS — I2692 Saddle embolus of pulmonary artery without acute cor pulmonale: Secondary | ICD-10-CM | POA: Diagnosis not present

## 2014-07-17 DIAGNOSIS — Z85828 Personal history of other malignant neoplasm of skin: Secondary | ICD-10-CM | POA: Diagnosis not present

## 2014-07-17 DIAGNOSIS — Z7901 Long term (current) use of anticoagulants: Secondary | ICD-10-CM | POA: Diagnosis not present

## 2014-07-17 DIAGNOSIS — I2510 Atherosclerotic heart disease of native coronary artery without angina pectoris: Secondary | ICD-10-CM | POA: Diagnosis not present

## 2014-07-17 DIAGNOSIS — J449 Chronic obstructive pulmonary disease, unspecified: Secondary | ICD-10-CM | POA: Diagnosis not present

## 2014-07-17 DIAGNOSIS — I82402 Acute embolism and thrombosis of unspecified deep veins of left lower extremity: Secondary | ICD-10-CM | POA: Diagnosis not present

## 2014-07-17 DIAGNOSIS — N189 Chronic kidney disease, unspecified: Secondary | ICD-10-CM | POA: Diagnosis not present

## 2014-07-17 DIAGNOSIS — I129 Hypertensive chronic kidney disease with stage 1 through stage 4 chronic kidney disease, or unspecified chronic kidney disease: Secondary | ICD-10-CM | POA: Diagnosis not present

## 2014-07-19 DIAGNOSIS — I82402 Acute embolism and thrombosis of unspecified deep veins of left lower extremity: Secondary | ICD-10-CM | POA: Diagnosis not present

## 2014-07-19 DIAGNOSIS — J449 Chronic obstructive pulmonary disease, unspecified: Secondary | ICD-10-CM | POA: Diagnosis not present

## 2014-07-19 DIAGNOSIS — Z85828 Personal history of other malignant neoplasm of skin: Secondary | ICD-10-CM | POA: Diagnosis not present

## 2014-07-19 DIAGNOSIS — I2692 Saddle embolus of pulmonary artery without acute cor pulmonale: Secondary | ICD-10-CM | POA: Diagnosis not present

## 2014-07-19 DIAGNOSIS — I2510 Atherosclerotic heart disease of native coronary artery without angina pectoris: Secondary | ICD-10-CM | POA: Diagnosis not present

## 2014-07-19 DIAGNOSIS — I129 Hypertensive chronic kidney disease with stage 1 through stage 4 chronic kidney disease, or unspecified chronic kidney disease: Secondary | ICD-10-CM | POA: Diagnosis not present

## 2014-07-19 DIAGNOSIS — Z7901 Long term (current) use of anticoagulants: Secondary | ICD-10-CM | POA: Diagnosis not present

## 2014-07-19 DIAGNOSIS — N189 Chronic kidney disease, unspecified: Secondary | ICD-10-CM | POA: Diagnosis not present

## 2014-07-25 DIAGNOSIS — N189 Chronic kidney disease, unspecified: Secondary | ICD-10-CM | POA: Diagnosis not present

## 2014-07-25 DIAGNOSIS — Z85828 Personal history of other malignant neoplasm of skin: Secondary | ICD-10-CM | POA: Diagnosis not present

## 2014-07-25 DIAGNOSIS — J449 Chronic obstructive pulmonary disease, unspecified: Secondary | ICD-10-CM | POA: Diagnosis not present

## 2014-07-25 DIAGNOSIS — N2581 Secondary hyperparathyroidism of renal origin: Secondary | ICD-10-CM | POA: Diagnosis not present

## 2014-07-25 DIAGNOSIS — N183 Chronic kidney disease, stage 3 (moderate): Secondary | ICD-10-CM | POA: Diagnosis not present

## 2014-07-25 DIAGNOSIS — I2510 Atherosclerotic heart disease of native coronary artery without angina pectoris: Secondary | ICD-10-CM | POA: Diagnosis not present

## 2014-07-25 DIAGNOSIS — I129 Hypertensive chronic kidney disease with stage 1 through stage 4 chronic kidney disease, or unspecified chronic kidney disease: Secondary | ICD-10-CM | POA: Diagnosis not present

## 2014-07-25 DIAGNOSIS — I10 Essential (primary) hypertension: Secondary | ICD-10-CM | POA: Diagnosis not present

## 2014-07-25 DIAGNOSIS — D631 Anemia in chronic kidney disease: Secondary | ICD-10-CM | POA: Diagnosis not present

## 2014-07-25 DIAGNOSIS — I82402 Acute embolism and thrombosis of unspecified deep veins of left lower extremity: Secondary | ICD-10-CM | POA: Diagnosis not present

## 2014-07-25 DIAGNOSIS — R809 Proteinuria, unspecified: Secondary | ICD-10-CM | POA: Diagnosis not present

## 2014-07-25 DIAGNOSIS — Z7901 Long term (current) use of anticoagulants: Secondary | ICD-10-CM | POA: Diagnosis not present

## 2014-07-25 DIAGNOSIS — I2692 Saddle embolus of pulmonary artery without acute cor pulmonale: Secondary | ICD-10-CM | POA: Diagnosis not present

## 2014-07-28 DIAGNOSIS — Z7901 Long term (current) use of anticoagulants: Secondary | ICD-10-CM | POA: Diagnosis not present

## 2014-07-28 DIAGNOSIS — I2692 Saddle embolus of pulmonary artery without acute cor pulmonale: Secondary | ICD-10-CM | POA: Diagnosis not present

## 2014-07-28 DIAGNOSIS — I2510 Atherosclerotic heart disease of native coronary artery without angina pectoris: Secondary | ICD-10-CM | POA: Diagnosis not present

## 2014-07-28 DIAGNOSIS — I129 Hypertensive chronic kidney disease with stage 1 through stage 4 chronic kidney disease, or unspecified chronic kidney disease: Secondary | ICD-10-CM | POA: Diagnosis not present

## 2014-07-28 DIAGNOSIS — J449 Chronic obstructive pulmonary disease, unspecified: Secondary | ICD-10-CM | POA: Diagnosis not present

## 2014-07-28 DIAGNOSIS — I82402 Acute embolism and thrombosis of unspecified deep veins of left lower extremity: Secondary | ICD-10-CM | POA: Diagnosis not present

## 2014-07-28 DIAGNOSIS — N189 Chronic kidney disease, unspecified: Secondary | ICD-10-CM | POA: Diagnosis not present

## 2014-07-28 DIAGNOSIS — Z85828 Personal history of other malignant neoplasm of skin: Secondary | ICD-10-CM | POA: Diagnosis not present

## 2014-08-02 DIAGNOSIS — Z7901 Long term (current) use of anticoagulants: Secondary | ICD-10-CM | POA: Diagnosis not present

## 2014-08-02 DIAGNOSIS — J449 Chronic obstructive pulmonary disease, unspecified: Secondary | ICD-10-CM | POA: Diagnosis not present

## 2014-08-02 DIAGNOSIS — I2692 Saddle embolus of pulmonary artery without acute cor pulmonale: Secondary | ICD-10-CM | POA: Diagnosis not present

## 2014-08-02 DIAGNOSIS — N189 Chronic kidney disease, unspecified: Secondary | ICD-10-CM | POA: Diagnosis not present

## 2014-08-02 DIAGNOSIS — I129 Hypertensive chronic kidney disease with stage 1 through stage 4 chronic kidney disease, or unspecified chronic kidney disease: Secondary | ICD-10-CM | POA: Diagnosis not present

## 2014-08-02 DIAGNOSIS — I2510 Atherosclerotic heart disease of native coronary artery without angina pectoris: Secondary | ICD-10-CM | POA: Diagnosis not present

## 2014-08-02 DIAGNOSIS — Z85828 Personal history of other malignant neoplasm of skin: Secondary | ICD-10-CM | POA: Diagnosis not present

## 2014-08-02 DIAGNOSIS — I82402 Acute embolism and thrombosis of unspecified deep veins of left lower extremity: Secondary | ICD-10-CM | POA: Diagnosis not present

## 2014-08-10 DIAGNOSIS — J449 Chronic obstructive pulmonary disease, unspecified: Secondary | ICD-10-CM | POA: Diagnosis not present

## 2014-08-10 DIAGNOSIS — I2510 Atherosclerotic heart disease of native coronary artery without angina pectoris: Secondary | ICD-10-CM | POA: Diagnosis not present

## 2014-08-10 DIAGNOSIS — I129 Hypertensive chronic kidney disease with stage 1 through stage 4 chronic kidney disease, or unspecified chronic kidney disease: Secondary | ICD-10-CM | POA: Diagnosis not present

## 2014-08-10 DIAGNOSIS — Z7901 Long term (current) use of anticoagulants: Secondary | ICD-10-CM | POA: Diagnosis not present

## 2014-08-10 DIAGNOSIS — I82402 Acute embolism and thrombosis of unspecified deep veins of left lower extremity: Secondary | ICD-10-CM | POA: Diagnosis not present

## 2014-08-10 DIAGNOSIS — I2692 Saddle embolus of pulmonary artery without acute cor pulmonale: Secondary | ICD-10-CM | POA: Diagnosis not present

## 2014-08-10 DIAGNOSIS — N189 Chronic kidney disease, unspecified: Secondary | ICD-10-CM | POA: Diagnosis not present

## 2014-08-10 DIAGNOSIS — Z85828 Personal history of other malignant neoplasm of skin: Secondary | ICD-10-CM | POA: Diagnosis not present

## 2014-08-19 DIAGNOSIS — H2511 Age-related nuclear cataract, right eye: Secondary | ICD-10-CM | POA: Diagnosis not present

## 2014-08-25 DIAGNOSIS — H2511 Age-related nuclear cataract, right eye: Secondary | ICD-10-CM | POA: Diagnosis not present

## 2014-09-05 ENCOUNTER — Ambulatory Visit: Payer: Self-pay | Admitting: Ophthalmology

## 2014-09-05 DIAGNOSIS — H2510 Age-related nuclear cataract, unspecified eye: Secondary | ICD-10-CM | POA: Diagnosis not present

## 2014-09-05 DIAGNOSIS — E780 Pure hypercholesterolemia: Secondary | ICD-10-CM | POA: Diagnosis not present

## 2014-09-05 DIAGNOSIS — Z886 Allergy status to analgesic agent status: Secondary | ICD-10-CM | POA: Diagnosis not present

## 2014-09-05 DIAGNOSIS — Z955 Presence of coronary angioplasty implant and graft: Secondary | ICD-10-CM | POA: Diagnosis not present

## 2014-09-05 DIAGNOSIS — H2511 Age-related nuclear cataract, right eye: Secondary | ICD-10-CM | POA: Diagnosis not present

## 2014-09-05 DIAGNOSIS — Z87891 Personal history of nicotine dependence: Secondary | ICD-10-CM | POA: Diagnosis not present

## 2014-09-05 DIAGNOSIS — I252 Old myocardial infarction: Secondary | ICD-10-CM | POA: Diagnosis not present

## 2014-09-05 DIAGNOSIS — I2510 Atherosclerotic heart disease of native coronary artery without angina pectoris: Secondary | ICD-10-CM | POA: Diagnosis not present

## 2014-09-05 DIAGNOSIS — I10 Essential (primary) hypertension: Secondary | ICD-10-CM | POA: Diagnosis not present

## 2014-09-05 DIAGNOSIS — H9190 Unspecified hearing loss, unspecified ear: Secondary | ICD-10-CM | POA: Diagnosis not present

## 2014-10-10 DIAGNOSIS — R05 Cough: Secondary | ICD-10-CM | POA: Diagnosis not present

## 2014-10-11 ENCOUNTER — Ambulatory Visit
Admit: 2014-10-11 | Disposition: A | Discharge status: Home / Self Care | Payer: Self-pay | Attending: Family Medicine | Admitting: Family Medicine

## 2014-10-11 DIAGNOSIS — R05 Cough: Secondary | ICD-10-CM | POA: Diagnosis not present

## 2014-10-11 DIAGNOSIS — J984 Other disorders of lung: Secondary | ICD-10-CM | POA: Diagnosis not present

## 2014-10-20 DIAGNOSIS — Z7901 Long term (current) use of anticoagulants: Secondary | ICD-10-CM | POA: Diagnosis not present

## 2014-10-20 DIAGNOSIS — R0989 Other specified symptoms and signs involving the circulatory and respiratory systems: Secondary | ICD-10-CM | POA: Diagnosis not present

## 2014-10-20 DIAGNOSIS — I2692 Saddle embolus of pulmonary artery without acute cor pulmonale: Secondary | ICD-10-CM | POA: Diagnosis not present

## 2014-10-20 DIAGNOSIS — I82402 Acute embolism and thrombosis of unspecified deep veins of left lower extremity: Secondary | ICD-10-CM | POA: Diagnosis not present

## 2014-10-22 NOTE — Consult Note (Signed)
Chief Complaint:  Subjective/Chief Complaint No Chest pressure  Does have some wheezing  Wheezes at home some as well   VITAL SIGNS/ANCILLARY NOTES: **Vital Signs.:   08-Aug-15 12:35  Vital Signs Type Routine  Temperature Temperature (F) 98.1  Celsius 36.7  Temperature Source oral  Pulse Pulse 76  Respirations Respirations 20  Systolic BP Systolic BP 637  Diastolic BP (mmHg) Diastolic BP (mmHg) 74  Mean BP 93  Pulse Ox % Pulse Ox % 90  Pulse Ox Activity Level  At rest  Oxygen Delivery Room Air/ 21 %   Physical Exam:  GEN no acute distress   NECK JVP is normal   RESP Wheezes bilaterally  No rales   CARD regular rate  no murmur  SR with couplets/triplets   ABD denies tenderness  no liver/spleen enlargement   EXTR negative edema, Feet warm   PSYCH alert, A+O to time, place, person   Lab Results: Routine Chem:  08-Aug-15 04:30   Glucose, Serum  108  BUN  21  Creatinine (comp)  1.57  Sodium, Serum 137  Potassium, Serum 4.3  Chloride, Serum 105  CO2, Serum 24  Calcium (Total), Serum 8.9  Anion Gap 8  Osmolality (calc) 277  eGFR (African American)  47  eGFR (Non-African American)  40 (eGFR values <79m/min/1.73 m2 may be an indication of chronic kidney disease (CKD). Calculated eGFR is useful in patients with stable renal function. The eGFR calculation will not be reliable in acutely ill patients when serum creatinine is changing rapidly. It is not useful in  patients on dialysis. The eGFR calculation may not be applicable to patients at the low and high extremes of body sizes, pregnant women, and vegetarians.)  Routine Hem:  08-Aug-15 04:30   WBC (CBC) 10.5  RBC (CBC)  4.33  Hemoglobin (CBC) 13.7  Hematocrit (CBC) 40.6  Platelet Count (CBC)  139  MCV 94  MCH 31.6  MCHC 33.7  RDW 13.4  Neutrophil % 66.9  Lymphocyte % 21.9  Monocyte % 8.2  Eosinophil % 2.2  Basophil % 0.8  Neutrophil #  7.0  Lymphocyte # 2.3  Monocyte # 0.9  Eosinophil # 0.2   Basophil # 0.1 (Result(s) reported on 05 Feb 2014 at 04:51AM.)   Assessment/Plan:  Assessment/Plan:  Assessment 1.  CAD  s/p NSTEMI Patient's last cath was in 2004:  50% D1  100% prox LAD; 40% OM1; 40% RCA with patent stent  R to L collaterals LVEF 60% Chest pressure yesterday  Currently asymptomatic.   Continue heparin  Plan cath on Monday  2.  Pulm  Patient with wheezing.  Sats 90%  WIll add O2.  Check BNP though volume status looks pretty good Add NMT.  Patient receiving empiric ABX  3.  Renal  WIll need to be followed closely  4.  ASA allergy  On Plavix   Electronic Signatures: RDorris Carnes(MD)  (Signed 08-Aug-15 12:47)  Authored: Chief Complaint, VITAL SIGNS/ANCILLARY NOTES, Physical Exam, Lab Results, Assessment/Plan   Last Updated: 08-Aug-15 12:47 by RDorris Carnes(MD)

## 2014-10-22 NOTE — Discharge Summary (Signed)
PATIENT NAME:  [name] [name] [name], [name] [name] MR#:  [id number] DATE OF BIRTH:  06-03-1928  DATE OF ADMISSION:  02/04/2014 DATE OF DISCHARGE:  02/09/2014  DISCHARGE MEDICATIONS:  1.  Advair Diskus 45/21 mcg 2 puffs b.i.d.  2.  Crestor 5 mg p.o. daily.  3.  atenolol  50 mg p.o. daily.  4.  Benazepril 40 mg p.o. daily.  5.  Coumadin 1 mg, 2 tablets daily.  6.  Combivent inhalers 1 puff 4 times daily.  7.  Prednisone 20 mg 2 tablets daily for 2 days, 1 tablet daily for 2 days, and then stop.   DISCHARGE DIAGNOSES:  1.  Acute respiratory failure secondary to pulmonary embolus.  2.  Elevated troponin secondary to pulmonary embolus and intravenous steroids and acute myocardial infarction. 3.  Chronic obstructive pulmonary disease exacerbation.  4.  Acute bronchitis.  5.  Hypertension.   CONSULTATIONS: Cardiology consult with Dr. Lorine BearsMuhammad [name].   HOSPITAL COURSE:  1.  This patient is an 81 year old male patient admitted because of trouble breathing and chest pain. The patient has coronary artery disease and a stent a few years ago. The patient was admitted to hospitalist service for possible cardiac event. The patient was admitted to telemetry for possible acute coronary syndrome, started on aspirin and beta blockers, and started on a heparin drip. Cardiology was consulted for possible stent stenosis with acute coronary syndrome.  [name] troponins were elevated on admission was 1.20, then they decreased to 0.99. Cardiology was consulted and the patient was supposed to have a cardiac catheterization on Monday, but the patient persistently had trouble breathing, so a CT angio chest was done on August 8, which showed large bilateral pulmonary emboli, more on the right, and the patient had a right heart strain. Dr. Kirke [name] mentioned that the patient had elevated troponin secondary to PE and an acute cardiac event, and cardiac catheterization was not pursued. The patient also had a leg ultrasound for DVT evaluation,  and had a left leg DVT, in femoral, popliteal and posterior tibial veins. No evidence of DVT in the right leg. The patient continued on the heparin drip and we started Coumadin also. We discussed the risks and benefits of anticoagulation with the patient and family. Because of his kidney failure and advanced age, we did not consider Eliquis or Xarelto. The patient was discharged home with Coumadin and INR was therapeutic at 2.6 yesterday.   2.  Elevated troponins. The patient was getting Plavix at home. Because he is on Coumadin, we discontinued the Plavix, as per his cardiologist's recommendation, because of his advanced age and high risk for bleeding.  3.  Chronic obstructive pulmonary disease. The patient continued to have wheezing and cough. The patient was started on nebulizers and steroids, and the patient was discharged home with steroids. He was taking Advair.  4.  Chronic kidney disease. [name] creatinine is around 1.66, and the patient has CKD stage III.  5.  Elevated white count; it was 12.3 when he came, but blood cultures were negative and the patient's chest x-ray showed possible atelectasis versus pneumonia. He improved nicely with antibiotics and nebulizers, and the patient received Rocephin and Zithromax and heparin and a course of steroids.  The patient was discharged with a temperature 97.5, heart rate 65, blood pressure 105/55. The patient saturations were 93 on room air and also 95 with exertion on room air.   The patient received 5 days of IV antibiotics, so we did not discharge home with  any  antibiotics.   [name] primary doctor is Dr. Edwena [name]. I advised the patient to followup with Dr. Sherley [name] in about 1 to 2 days regarding INR and also adjusting Coumadin dose. I discussed this with the patient's daughter..    ____________________________ Katha Hamming, MD sk:MT D: 02/10/2014 07:58:11 ET T: 02/10/2014 08:51:26 ET JOB#: [id number]  cc: Katha Hamming, MD, <Dictator> [name] [name] Felty, MD Chelsea Aus. [name] Corin, MD   Katha Hamming MD ELECTRONICALLY SIGNED 02/26/2014 23:30

## 2014-10-22 NOTE — H&P (Signed)
PATIENT NAME:  [name] [name], [name] [name] MR#:  [id number] DATE OF BIRTH:  February 28, 1928  DATE OF ADMISSION:  02/04/2014  FAMILY PHYSICIAN: (DR.Bobetta Lime.  CHIEF COMPLAINT: Chest pain.   HISTORY OF PRESENT ILLNESS: The patient is an 81 year old male patient with history of coronary artery disease status post stent a couple of years ago who comes in with chest pain. The patient noted to have chest pain on the left side for 2 weeks. The patient says that he feels dizzy  some times, The patient says that chest pain sometimes goes to the right side and also right hand. The patient denies any nausea or vomiting, but he has cough and green-colored phlegm. The patient feels shortness of breath is now with minimal exertion. Denies any fever. No nausea. No vomiting.   PAST MEDICAL HISTORY: Significant for history of coronary artery disease status post stent 4 to 5 years ago. The patient had only 1 stent and he does not remember his cardiologist's name. He also has a history of asthma.   HOME MEDICATIONS: He does not remember the medication list, but he goes to [facility] so we are in the process of getting medication list from [facility].   SOCIAL HISTORY: The patient does not smoke or drink. No history of drugs. He lives with his wife.   PAST SURGICAL HISTORY: Significant for hip replacement; he says he had 2 hips replaced, and history of kidney stones before.   FAMILY HISTORY: No hypertension or diabetes.   REVIEW OF SYSTEMS:  CONSTITUTIONAL: Denies any fever. No fatigue. No weakness.  EYES: No blurred vision. EAR, NOSE AND THROAT: The patient is very hard of hearing. No turbinate hypertrophy. No oropharyngeal erythema. The patient denies any trouble swallowing. The patient denies any epistaxis. RESPIRATORY: The patient complains of some cough and green phlegm. The patient also complains of exertional dyspnea.  CARDIOVASCULAR: Patient complains of chest pain, heaviness in his chest and some dizziness.  Denies any orthopnea or PND.  GASTROINTESTINAL: No nausea or vomiting. No abdominal pain.  GENITOURINARY: No dysuria.  ENDOCRINE: No polyuria or nocturia. HEMATOLOGICAL: No anemia.  INTEGUMENTARY: No skin rash.  MUSCULOSKELETAL: Denies any joint pain.  NEUROLOGIC: No numbness or weakness.  PSYCHIATRIC: No anxiety or insomnia.   PHYSICAL EXAMINATION:  VITAL SIGNS: Temperature 97.9 Fahrenheit, heart rate 75, blood pressure 140/70, and saturations 98% on room air.  GENERAL: He is an alert, awake, oriented, elderly male, hard of hearing but not in distress. HEAD: Atraumatic, normocephalic.  EYES: Pupils equal, reacting to light. Extraocular movements intact.  EAR, NOSE AND THROAT: The patient has no tympanic membrane congestion. No oropharyngeal erythema,  NECK: Supple. No JVD. No carotid bruits. .  RESPIRATORY:not using accessory muscles of respiration.has some expiratory wheezing on right side.Marland Kitchen   CARDIOVASCULAR: S1, S2 regular. PMI not displaced. No costochondral tenderness. The patient has equal pulse in carotid and dorsalis pedis s. No pedal edema.  GASTROINTESTINAL: Abdomen is soft, nontender, nondistended. Bowel sounds present. No hernias.  LYMPHATIC;no lymphadenopathy in axillary or  cervical region.  SKIN: Well hydrated.  NEUROLOGIC: DTR 2+ bilaterally. Sensation intact.  power 5/5 upper and lower extremities. PSYCHIATRIC: Mood and affect are within normal limits.   LABORATORY DATA: CK-MB                    HI  8.3                  0.5-3.6        ng/mL  TROPONIN-I               HI  0.99  Chest X-Ray: Shows   multifocal patchy opacities in the right perihilar region and left lower lobe, atelectasis versus pneumonia. 2.3                 3.8-10.6       x10^3/mm^3        RBC                        4.76                 4.40-5.90      x10^6/mm^3        HGB                        14.5                 13.0-18.0      g/dL              HCT                        45.4                  40.0-52.0      %                 MCV                        95 [name] EKG showed normal sinus rhythm at 79 beats minutes, no ST-T changes.  BASIC METAB.PNL(TOTAL CA CONTINUED...   BUN                    HI  21                   7-18           mg/dL             CREATININE             HI  1.57                 0.60-1.30      mg/dL             SODIUM                     137                  136-145        mmol/L            POTASSIUM                  4.3                  3.5-5.1        mmol/L            CHLORIDE                   105                  98-107         mmol/L            CO2  24                   21-32          mmol/L            CALCIUM, TOTAL             8.9                  8.5-10.1       mg/dL             OSMOLALITY                 277                  275-301                          ANION GAP                  8                    7-16                             eGFR (AFRICAN AMERICAN LO  47                                                    eGFR (NON-AFRICAN AMER LO  40  ASSESSMENT AND PLAN: 1. The patient is an 81 year old male with chest pain and elevated troponins. Symptoms concerning for acute coronary syndrome, possible in-stent stenosis. Admit him to hospitalist service on telemetry. Continue him on aspirin, small-dose beta blockers, and we started him on heparin drip, and I called [facility]  for cardiology consult to evaluate  .Marland Kitchen  2. Positive acute bronchitis with elevated white count, cough and green phlegm. Started him on (Rocephin,, Zithromax, and continue them.  3. GI prophylaxis:PPI  TIME SPENT: About 55 minutes    ____________________________ Epifanio Lesches, MD sk:nb D: 02/04/2014 14:07:48 ET T: 02/04/2014 15:15:35 ET JOB#: [id number]  cc: Epifanio Lesches, MD, <Dictator> Epifanio Lesches MD ELECTRONICALLY SIGNED 03/09/2014 12:07

## 2014-10-22 NOTE — Consult Note (Signed)
Chief Complaint:  Subjective/Chief Complaint SOB  No Chest pressure   VITAL SIGNS/ANCILLARY NOTES: **Vital Signs.:   09-Aug-15 05:01  Vital Signs Type Routine  Temperature Temperature (F) 98.5  Celsius 36.9  Temperature Source oral  Pulse Pulse 69  Respirations Respirations 18  Systolic BP Systolic BP 103  Diastolic BP (mmHg) Diastolic BP (mmHg) 57  Mean BP 71  Pulse Ox % Pulse Ox % 92  Pulse Ox Activity Level  At rest  Oxygen Delivery 3L    09:16  Vital Signs Type Pre Medication  Pulse Pulse 73  Respirations Respirations 18  Systolic BP Systolic BP 159  Diastolic BP (mmHg) Diastolic BP (mmHg) 69  Mean BP 82  Pulse Ox % Pulse Ox % 94  Pulse Ox Activity Level  At rest  Oxygen Delivery 3L  *Intake and Output.:   Shift 09-Aug-15 15:00  Grand Totals Intake:  240 Output:  350    Net:  -110 28 Hr.:  -110  Oral Intake      In:  240  Urine ml     Out:  350  Length of Stay Totals Intake:  912.5 Output:  2300    Net:  -1387.5   Physical Exam:  GEN no acute distress   NECK JVP is normal   RESP Decreased expiratory flow with wheezes   CARD regular rate  no murmur  Sinus rhythm  No afib   ABD Mild diffuse tenderness.  No hepatomegaly   EXTR No signif edema  Nontender  2+ pulses   Lab Results: Routine Chem:  08-Aug-15 12:41   B-Type Natriuretic Peptide [facility])  9539 (Result(s) reported on 05 Feb 2014 at 01:24PM.)  09-Aug-15 06:20   Glucose, Serum  104  BUN  20  Creatinine (comp)  1.66  Sodium, Serum 139  Potassium, Serum 3.6  Chloride, Serum 104  CO2, Serum 25  Calcium (Total), Serum  8.1  Anion Gap 10  Osmolality (calc) 280  eGFR (African American)  44  eGFR (Non-African American)  38 (eGFR values <83mL/min/1.73 m2 may be an indication of chronic kidney disease (CKD). Calculated eGFR is useful in patients with stable renal function. The eGFR calculation will not be reliable in acutely ill patients when serum creatinine is changing rapidly. It is not  useful in  patients on dialysis. The eGFR calculation may not be applicable to patients at the low and high extremes of body sizes, pregnant women, and vegetarians.)  Routine Coag:  08-Aug-15 12:41   D-Dimer, Quantitative 3147 (INTERPRETATION <> Exclusion of Venous Thromboembolism (VTE) - OUTPATIENT ONLY       (Emergency Department or [place])             0-499 ng/ml (FEU)  : With a low to intermediate pretest                                  probability for VTE this test result                                  excludes the diagnosis of VTE.             > 499 ng/ml (FEU)  : VTE not excluded; additional work up  for VTE is required. <> Testing on Inpatients and Evaluation of Disseminated Intravascular        Coagulation (DIC)             Reference Range:  0-499 ng/ml (FEU))  Routine Hem:  09-Aug-15 06:20   WBC (CBC) 9.3  RBC (CBC) 4.44  Hemoglobin (CBC) 13.8  Hematocrit (CBC) 42.0  Platelet Count (CBC) 161  MCV 95  MCH 31.1  MCHC 32.8  RDW 13.4  Neutrophil % 64.1  Lymphocyte % 23.9  Monocyte % 8.4  Eosinophil % 2.6  Basophil % 1.0  Neutrophil # 6.0  Lymphocyte # 2.2  Monocyte # 0.8  Eosinophil # 0.2  Basophil # 0.1 (Result(s) reported on 06 Feb 2014 at 07:12AM.)   Assessment/Plan:  Assessment/Plan:  Assessment 1.  Pulmonary embolus.   Echo done yesterday showed normal LV systolic function with normal regional motion.    RV was dilated and function was severely reduced.  D dimer was elevated.  Patient went on to have CT angio of chest   This showed pulmonary emboli (large). Patient remains on heparin.  I think this explains his presentation He notes a trip to [place] in June to see relative graduate.  8 hours each way with minimal stopping.  Denies LE edema after though to sugg DVT.   He does note progressive SOB over past 4 to 5 months with sudden worsening on Thursday.  Recomm:  Continue heparin Discussed with pharmacy  With marginal  renal function (no records to say if this is his baseline) would recomm coumadin.  Pharmacy to dose WOuld get USN of LE.  If negative get USN of abdomen Check hypercoagulable though prob low yield  2.  SOB  Significant  Some related to PE  He does have dicreased airflow on exam and wheezes.  His BNP is also elevated though  I am not convinced fluid explains all  Woudl recomm trial of steroids   3.  CAD  Again, I am not convinced of active coronary ischemia  Increased trop goes along with RV strain in setting of PE  4.  ASA allergy  On plavix.   Electronic Signatures: Dorris Carnes (MD)  (Signed 09-Aug-15 12:36)  Authored: Chief Complaint, VITAL SIGNS/ANCILLARY NOTES, Physical Exam, Lab Results, Assessment/Plan   Last Updated: 09-Aug-15 12:36 by Dorris Carnes (MD)

## 2014-10-22 NOTE — Consult Note (Signed)
General Aspect PCP:  [facility] cardiologist:  Einar Crow, MD (last seen in 2012)  81 y/o male with a h/o CAD s/p prior stenting who presented to the ED today with chest pain and has been found to have an elevated troponin of 1.5. __________________  Past Medical History: 1.  CAD:        a. s/p MI in 1982.        b. 01/2002 Cath/PCI:  LAD noted to be chronically occluded.  Severe RCA dzs treated with a 2.75 x 16 Express II BMS.      c. 08/2002 Cath: LM 20d, LAD 100p, D1 50, LCX 30p, OM1 40p, RCA 40d, patent stent, R->L collaterals.  EF 60%-->Med Rx.      d. Echo (5/10): EF 55-60%, normal wall motion, mild MR, PA systolic pressure 32 mmHg.        e. Dobutamine stress echo (7/11) with EF 60% and no evidence for ischemia or infarction.   2.  HL      a. Intolerances to Vytorin, Zocor, pravastatin - tolerates crestor. 3.  HTN 4.  SBO 12/2005  5.  Aspirin allergy:  Pt is taking Plavix 6. S/P Right THA in 2011 and L THA in 2012. __________________   Present Illness 81 y/o male with the above complex problem list.  He has known CTO of the LAD and is s/p BMS to the distal RCA in 2003 with patency of stent in 2004.  He was previously followed by Dr. Aundra Dubin in our office but hasn't been seen since 2012.  Pt lives locally with his wife and reports doing reasonably well over the years.  He was in his usoh until yesterday afternoon when he developed sudden onset of sscp and dyspnea.  Ss eased with rest and he was able to get comfortable, however chest pain never fully resolved.  He awoke three times during the night to use the commode, each time with scant, loose stool, and noted persistence of c/p, but it was not so severe that it prevented him from falling back to sleep.  This AM, c/p continued.  He was going to try and see his PCP however she was not in the office and so he decided to come into the ED for evaluation.  Here, ECG is non-acute however troponin returned @ 1.5.  He currently rates c/p as  2/10.  Heparin has been started.   Physical Exam:  GEN pleasant, nad.   HEENT pink conjunctivae, moist oral mucosa   NECK supple  No masses  no bruits/jvd.   RESP normal resp effort  Scattered rhonchi and exp wheezing.   CARD Regular rate and rhythm  Normal, S1, S2  distant.   ABD denies tenderness  soft  normal BS   LYMPH negative neck   EXTR negative cyanosis/clubbing, negative edema   SKIN normal to palpation   NEURO cranial nerves intact, motor/sensory function intact   PSYCH alert, A+O to time, place, person   Review of Systems:  General: No Complaints   Skin: No Complaints   ENT: No Complaints   Eyes: No Complaints   Neck: No Complaints   Respiratory: Short of breath  -yesterday.   Cardiovascular: Chest pain or discomfort  Dyspnea   Gastrointestinal: scant, loose stool x 3 last night.   Genitourinary: No Complaints   Vascular: No Complaints   Musculoskeletal: No Complaints   Neurologic: No Complaints   Hematologic: No Complaints   Endocrine: No Complaints   Psychiatric:  No Complaints   Review of Systems: All other systems were reviewed and found to be negative   Medications/Allergies Reviewed Medications/Allergies reviewed   Family & Social History:  Family and Social History:  Family History Mother died @ 70.  Did not know father.  No siblings.   Social History smoked a pipe for 15-20 yrs but quit many yrs ago.  No etoh/drugs.   Place of Living Home  Lives locally with wife.  Retired from [employer].  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   Home Medications: Medication Instructions Status  Advair HFA CFC free 45 mcg-21 mcg/inh inhalation aerosol 2 puff(s) inhaled 2 times a day Active  Crestor 5 mg oral tablet 1 tab(s) orally once a day (at bedtime) Active  atenolol 50 mg oral tablet 1 tab(s) orally once a day Active  benazepril 40 mg oral tablet 1 tab(s) orally once a day Active  Plavix 75 mg oral tablet 1 tab(s) orally once a day Active   Lab  Results:  Routine Chem:  07-Aug-15 11:03   Result Comment TROPONIN - RESULTS VERIFIED BY REPEAT TESTING.  Susy Frizzle AT [id number] 02/04/14  - READ-BACK PROCESS PERFORMED.  Result(s) reported on 04 Feb 2014 at 11:47AM.  Result Comment POTASSIUM/CREATININE - Slight hemolysis, interpret results with  - caution.  Result(s) reported on 04 Feb 2014 at 11:26AM.  Glucose, Serum  131  BUN  21  Creatinine (comp)  1.78  Sodium, Serum  135  Potassium, Serum 4.9  Chloride, Serum 103  CO2, Serum 24  Calcium (Total), Serum 9.0  Anion Gap 8  Osmolality (calc) 275  eGFR (African American)  40  eGFR (Non-African American)  35 (eGFR values <46m/min/1.73 m2 may be an indication of chronic kidney disease (CKD). Calculated eGFR is useful in patients with stable renal function. The eGFR calculation will not be reliable in acutely ill patients when serum creatinine is changing rapidly. It is not useful in  patients on dialysis. The eGFR calculation may not be applicable to patients at the low and high extremes of body sizes, pregnant women, and vegetarians.)  Cardiac:  07-Aug-15 11:03   Troponin I  1.50 (0.00-0.05 0.05 ng/mL or less: NEGATIVE  Repeat testing in 3-6 hrs  if clinically indicated. >0.05 ng/mL: POTENTIAL  MYOCARDIAL INJURY. Repeat  testing in 3-6 hrs if  clinically indicated. NOTE: An increase or decrease  of 30% or more on serial  testing suggests a  clinically important change)  CPK-MB, Serum  10.7 (Result(s) reported on 04 Feb 2014 at 12:23PM.)  Routine Coag:  07-Aug-15 11:03   Prothrombin 13.5  INR 1.0 (INR reference interval applies to patients on anticoagulant therapy. A single INR therapeutic range for coumarins is not optimal for all indications; however, the suggested range for most indications is 2.0 - 3.0. Exceptions to the INR Reference Range may include: Prosthetic heart valves, acute myocardial infarction, prevention of myocardial infarction, and combinations  of aspirin and anticoagulant. The need for a higher or lower target INR must be assessed individually. Reference: The Pharmacology and Management of the Vitamin K  antagonists: the seventh ACCP Conference on Antithrombotic and Thrombolytic Therapy. CPPIRJ.1884Sept:126 (3suppl): [id number] A HCT value >55% may artifactually increase the PT.  In one study,  the increase was an average of 25%. Reference:  "Effect on Routine and Special Coagulation Testing Values of Citrate Anticoagulant Adjustment in Patients with High HCT Values." American Journal of Clinical Pathology 2006;126:400-405.)  Activated PTT (APTT) 30.1 (A HCT value >55% may artifactually  increase the APTT. In one study, the increase was an average of 19%. Reference: "Effect on Routine and Special Coagulation Testing Values of Citrate Anticoagulant Adjustment in Patients with High HCT Values." American Journal of Clinical Pathology 2006;126:400-405.)  Routine Hem:  07-Aug-15 11:03   WBC (CBC)  12.3  RBC (CBC) 4.76  Hemoglobin (CBC) 14.5  Hematocrit (CBC) 45.4  Platelet Count (CBC) 172 (Result(s) reported on 04 Feb 2014 at 11:17AM.)  MCV 95  MCH 30.5  MCHC 32.0  RDW 13.1   EKG:  EKG Interp. by me   Interpretation rsr, 79, inf q's, no acute st/t changes.   Radiology Results: XRay:    07-Aug-15 11:15, Chest Portable Single View  Chest Portable Single View   REASON FOR EXAM:    chest pain  COMMENTS:       PROCEDURE: DXR - DXR PORTABLE CHEST SINGLE VIEW  - Feb 04 2014 11:15AM     CLINICAL DATA:  Chest pain and shortness of breath.    EXAM:  PORTABLE CHEST - 1 VIEW    COMPARISON:  12/03/2013    FINDINGS:  Cardiomediastinal silhouette is within normal limits. Thoracic  aortic calcification is again seen. Linear opacities in the mid to  lower lungs bilaterally, left greater than right, do not appear  significantly changed. There may be slightly increased, hazy opacity  in the left lower lung compared to the  prior study. No change is  seen in the right lung. No pleural effusion or pneumothorax is  identified. Multilevel bridging osteophytosis is noted throughout  the thoracic spine.     IMPRESSION:  Linear opacities in the lung bases suggestive of scarring. Question  of slightly increased hazy opacity in the left lower lung, and  superimposed acute infection is not excluded.      Electronically Signed    By: Logan Bores    On: 02/04/2014 11:30         Verified By: Ferol Luz, M.D.,    Aspirin: Unknown   Impression 1.  NSTEMI:  Pt with known moderate CAD and CTO of the LAD s/p PCI/BMS of the distal RCA in 2003 with patency of stent on cath in 2004.  Neg dob echo in 2011.  He presents today with a nearly 24 hr h/o persistent chest discomfort that started @ rest yesterday.  Chest pain currently 2/10.  ECG non-acute (compared to 11/2009 ECG in Epic).  Troponin elevated @ 1.5.  Admit, cycle CE, heparin added.  Add nitrate.  Cont plavix (ASA allergic), bb, statin.  Plan for cath on Monday.  2.  HTN:  stable.  Cont bb.  Hold acei in setting of renal insuff and need for cath.  3.  HL:  Cont statin therapy.  4.  Abnl CXR:  Rhonchi and wheezing on exam.  He uses inhaler @ home.  CXR raises ? of pna.  Afebrile.  WBC mildly elevated.  Abx per IM.  5.  Renal insufficiency/? CKD III:  Creat 1.78.  Baseline unknown.  Hold acei.  Hydrate.  Follow.   Electronic Signatures for Addendum Section:  Kathlyn Sacramento (MD) (Signed Addendum 07-Aug-15 16:54)  The patient was seen and examined. Agree with the above. he presented with chest pain with elevated TnI. No ECg changes. Known CAD as outlined above.  Recommend cardiac cath on [place]. Obtain echo in order to avoid LV gram given CKD.   Electronic Signatures: Kathlyn Sacramento (MD)  (Signed 07-Aug-15 16:54)  Co-Signer: General Aspect/Present Illness, Home  Medications, Allergies Murray Hodgkins R (NP)  (Signed 07-Aug-15 15:03)  Authored: General  Aspect/Present Illness, History and Physical Exam, Review of System, Family & Social History, Home Medications, Labs, EKG , Radiology, Allergies, Impression/Plan   Last Updated: 07-Aug-15 16:54 by Kathlyn Sacramento (MD)

## 2014-10-30 NOTE — Op Note (Signed)
PATIENT NAME:  [name] [name], [name] [name] DATE OF BIRTH:  01/17/1928  DATE OF PROCEDURE:  09/05/2014  PREOPERATIVE DIAGNOSIS:  Nuclear sclerotic cataract, right eye.   POSTOPERATIVE DIAGNOSIS:  Nuclear sclerotic cataract, right eye.   PROCEDURE PERFORMED:  Extracapsular cataract extraction using phacoemulsification with placement of an Alcon SN60WF, 19.0-diopter posterior chamber lens, serial  number [id number].   SURGEON:  Maylon PeppersSteven A. Starlyn Droge, MD  ASSISTANT:  None.  ANESTHESIA:  4% lidocaine and 0.75% Marcaine in a 50/50 mixture with 10 units/mL of Hylenex added, given as a peribulbar.   ANESTHESIOLOGIST:  Dr. Darleene CleaverVan Staveren.     COMPLICATIONS:  None.  ESTIMATED BLOOD LOSS:  Less than 1 ml.  DESCRIPTION OF PROCEDURE:  The patient was brought to the operating room and given a peribulbar block.  The patient was then prepped and draped in the usual fashion.  The vertical rectus muscles were imbricated using 5-0 silk sutures.  These sutures were then clamped to the sterile drapes as bridle sutures.  A limbal peritomy was performed extending two clock hours and hemostasis was obtained with cautery.  A partial thickness scleral groove was made at the surgical limbus and dissected anteriorly in a lamellar dissection using an Alcon crescent knife.  The anterior chamber was entered superonasally with a Superblade and through the lamellar dissection with a 2.6 mm keratome.  DisCoVisc was used to replace the aqueous and a continuous tear capsulorrhexis was carried out.  Hydrodissection and hydrodelineation were carried out with balanced salt and a 27 gauge canula.  The nucleus was rotated to confirm the effectiveness of the hydrodissection.  Phacoemulsification was carried out using a divide-and-conquer technique.  Total ultrasound time was 1 minutes and 37 seconds with an average power of 26.2 percent. CDE of 45.05.   Irrigation/aspiration was used to remove the residual cortex.  DisCoVisc  was used to inflate the capsule and the internal incision was enlarged to 3 mm with the crescent knife.  The intraocular lens was folded and inserted into the capsular bag using the AcrySert delivery system.  Irrigation/aspiration was used to remove the residual DisCoVisc.  Miostat was injected into the anterior chamber through the paracentesis track to inflate the anterior chamber and induce miosis. 0.1 mL of cefuroxime containing 1 mg of drug was injected via the paracentesis track.  The wound was checked for leaks and none were found. The conjunctiva was closed with cautery and the bridle sutures were removed.  Two drops of 0.3% Vigamox were placed on the eye.   An eye shield was placed on the eye.  The patient was discharged to the recovery room in good condition.    ____________________________ Maylon PeppersSteven A. Roselia Snipe, MD sad:bu D: 09/05/2014 13:12:44 ET T: 09/05/2014 15:59:07 ET JOB#: [id number]  cc: Viviann SpareSteven A. Charlann Wayne, MD, <Dictator> Erline LevineSTEVEN A Jakevion Arney MD ELECTRONICALLY SIGNED 09/19/2014 14:20

## 2015-01-16 ENCOUNTER — Encounter: Payer: Self-pay | Admitting: Family Medicine

## 2015-01-16 ENCOUNTER — Ambulatory Visit (INDEPENDENT_AMBULATORY_CARE_PROVIDER_SITE_OTHER): Payer: Commercial Managed Care - HMO | Admitting: Family Medicine

## 2015-01-16 VITALS — BP 126/82 | HR 71 | Temp 98.0°F | Resp 18 | Ht 65.0 in | Wt 161.0 lb

## 2015-01-16 DIAGNOSIS — R0602 Shortness of breath: Secondary | ICD-10-CM | POA: Insufficient documentation

## 2015-01-16 DIAGNOSIS — Z955 Presence of coronary angioplasty implant and graft: Secondary | ICD-10-CM | POA: Insufficient documentation

## 2015-01-16 DIAGNOSIS — N183 Chronic kidney disease, stage 3 unspecified: Secondary | ICD-10-CM | POA: Insufficient documentation

## 2015-01-16 DIAGNOSIS — I2510 Atherosclerotic heart disease of native coronary artery without angina pectoris: Secondary | ICD-10-CM | POA: Diagnosis not present

## 2015-01-16 DIAGNOSIS — I1310 Hypertensive heart and chronic kidney disease without heart failure, with stage 1 through stage 4 chronic kidney disease, or unspecified chronic kidney disease: Secondary | ICD-10-CM | POA: Insufficient documentation

## 2015-01-16 DIAGNOSIS — E782 Mixed hyperlipidemia: Secondary | ICD-10-CM | POA: Insufficient documentation

## 2015-01-16 DIAGNOSIS — I82402 Acute embolism and thrombosis of unspecified deep veins of left lower extremity: Secondary | ICD-10-CM

## 2015-01-16 DIAGNOSIS — Z86718 Personal history of other venous thrombosis and embolism: Secondary | ICD-10-CM | POA: Insufficient documentation

## 2015-01-16 DIAGNOSIS — B9789 Other viral agents as the cause of diseases classified elsewhere: Secondary | ICD-10-CM

## 2015-01-16 DIAGNOSIS — J069 Acute upper respiratory infection, unspecified: Secondary | ICD-10-CM

## 2015-01-16 DIAGNOSIS — Z0000 Encounter for general adult medical examination without abnormal findings: Secondary | ICD-10-CM | POA: Insufficient documentation

## 2015-01-16 DIAGNOSIS — Z85828 Personal history of other malignant neoplasm of skin: Secondary | ICD-10-CM | POA: Insufficient documentation

## 2015-01-16 DIAGNOSIS — H409 Unspecified glaucoma: Secondary | ICD-10-CM | POA: Insufficient documentation

## 2015-01-16 DIAGNOSIS — Z86711 Personal history of pulmonary embolism: Secondary | ICD-10-CM | POA: Diagnosis not present

## 2015-01-16 DIAGNOSIS — Z9229 Personal history of other drug therapy: Secondary | ICD-10-CM | POA: Insufficient documentation

## 2015-01-16 DIAGNOSIS — I10 Essential (primary) hypertension: Secondary | ICD-10-CM | POA: Insufficient documentation

## 2015-01-16 DIAGNOSIS — N200 Calculus of kidney: Secondary | ICD-10-CM | POA: Insufficient documentation

## 2015-01-16 DIAGNOSIS — L299 Pruritus, unspecified: Secondary | ICD-10-CM | POA: Insufficient documentation

## 2015-01-16 NOTE — Progress Notes (Signed)
Name: [name] [name]   MRN: [id number]    DOB: 1928/03/04   Date:01/16/2015       Progress Note  Subjective  Chief Complaint  Chief Complaint  Patient presents with  . Follow-up    patient is here for his 65-month follow-up  . URI    patient has been using mucinex and it has helped with the productive cough    HPI  Patient is here today for a Male Medicare Wellness Visit:  The patient has been in otherwise good general health and voices concerns regarding a cough. Onset 2 weeks ago. Sputum production scant clear yellow to white. No fevers, no chest pain, no nausea, no worsening shortness of breath.  He is also very insistent on discontinuing his Xarelto due to cost. He is willing to take ASA instead if needed.  Past Medical History  Diagnosis Date  . Arthritis   . Hyperlipidemia   . Hypertension   . Myocardial infarction   . Pulmonary embolism 01/2014  . Left leg DVT 01/2014    Past Surgical History  Procedure Laterality Date  . Coronary stent placement  2006  . Len replaced Left 2015  . Melanoma excision      40 years ago  . Joint replacement  2011 & 2012    both hips    Family History  Problem Relation Age of Onset  . Arthritis Mother   . Arthritis Father   . Depression Father     History   Social History  . Marital Status: Married    Spouse Name: N/A  . Number of Children: N/A  . Years of Education: N/A   Occupational History  . Not on file.   Social History Main Topics  . Smoking status: Never Smoker   . Smokeless tobacco: Not on file  . Alcohol Use: No  . Drug Use: No  . Sexual Activity: No   Other Topics Concern  . Not on file   Social History Narrative  . No narrative on file     Current outpatient prescriptions:  .  atenolol (TENORMIN) 50 MG tablet, Take by mouth., Disp: , Rfl:  .  fluticasone-salmeterol (ADVAIR HFA) 45-21 MCG/ACT inhaler, Inhale into the lungs., Disp: , Rfl:  .  rivaroxaban (XARELTO) 20 MG TABS tablet, Take by  mouth., Disp: , Rfl:  .  rosuvastatin (CRESTOR) 5 MG tablet, Take by mouth., Disp: , Rfl:   Allergies  Allergen Reactions  . Aspirin Anaphylaxis, Swelling and Shortness Of Breath    REACTION: terrible bp reaction/wheezing  . Pollen Extract     ragweed  . Rabbit Epithelium     rabbits    Fall Risk: Fall Risk  01/16/2015  Falls in the past year? Yes  Number falls in past yr: 1  Injury with Fall? Yes  Risk Factor Category  High Fall Risk  Follow up Falls evaluation completed;Falls prevention discussed;Education provided    Depression screen [facility] 2/9 01/16/2015  Decreased Interest 0  Down, Depressed, Hopeless 0  PHQ - 2 Score 0    Functional Status Survey: Is the patient deaf or have difficulty hearing?: Yes Does the patient have difficulty seeing, even when wearing glasses/contacts?: No (patient has recently had his lens replaced) Does the patient have difficulty concentrating, remembering, or making decisions?: Yes (patient stated he has some issues remembering) Does the patient have difficulty walking or climbing stairs?: Yes Does the patient have difficulty dressing or bathing?: No Does the patient have difficulty doing  errands alone such as visiting a doctor's office or shopping?: No   ROS  CONSTITUTIONAL: No significant weight changes, fever, chills, weakness or fatigue.  HEENT:  - Eyes: No visual changes.  - Ears: No auditory changes. No pain.  - Nose: No sneezing, congestion, runny nose. - Throat: No sore throat. No changes in swallowing. SKIN: No rash or itching.  CARDIOVASCULAR: No chest pain, chest pressure or chest discomfort. No palpitations or edema.  RESPIRATORY: No shortness of breath, cough or sputum.  GASTROINTESTINAL: No anorexia, nausea, vomiting. No changes in bowel habits. No abdominal pain or blood.  GENITOURINARY: No dysuria. No frequency. No discharge.  NEUROLOGICAL: No headache, dizziness, syncope, paralysis, ataxia, numbness or tingling in the  extremities. No memory changes. No change in bowel or bladder control.  MUSCULOSKELETAL: No joint pain. No muscle pain. HEMATOLOGIC: No anemia, bleeding or bruising.  LYMPHATICS: No enlarged lymph nodes.  PSYCHIATRIC: No change in mood. No change in sleep pattern.  ENDOCRINOLOGIC: No reports of sweating, cold or heat intolerance. No polyuria or polydipsia.   Objective  Filed Vitals:   01/16/15 0937  BP: 126/82  Pulse: 71  Temp: 98 F (36.7 C)  TempSrc: Oral  Resp: 18  Height:  (1.651 m)  Weight: 161 lb (73.029 kg)  SpO2: 95%   Body mass index is 26.79 kg/(m^2).  Physical Exam  Constitutional: Patient appears well-developed and well-nourished. In no distress.  HEENT:  - Head: Normocephalic and atraumatic.  - Ears: Bilateral TMs gray, no erythema or effusion - Nose: Nasal mucosa moist - Mouth/Throat: Oropharynx is clear and moist. No tonsillar hypertrophy or erythema. No post nasal drainage.  - Eyes: Conjunctivae clear, EOM movements normal. PERRLA. No scleral icterus.  Neck: Normal range of motion. Neck supple. No JVD present. No thyromegaly present.  Cardiovascular: Normal rate, regular rhythm and normal heart sounds.  No murmur heard.  Pulmonary/Chest: Effort normal and breath sounds normal. No respiratory distress. Musculoskeletal: Normal range of motion bilateral UE and LE, no joint effusions. Peripheral vascular: Bilateral LE no edema. Neurological: CN II-XII grossly intact with no focal deficits. Alert and oriented to person, place, and time. Coordination, balance, strength, speech and gait are normal.  Skin: Skin is warm and dry. No rash noted. No erythema.  Psychiatric: Patient has a normal mood and affect. Behavior is normal in office today. Judgment and thought content normal in office today.   Assessment & Plan  1. Medicare annual wellness visit, subsequent Functional ability/safety issues: No Issues Hearing issues: Addressed  Activities of daily living:  Discussed Home safety issues: No Issues  End Of Life Planning: Offered verbal information regarding advanced directives, healthcare power of attorney.  Preventative care, Health maintenance, Preventative health measures discussed.  Preventative screenings discussed today: lab work, colonoscopy, PSA.  Men age 75 to 78 years if ever smoked recommended to get a one time AAA ultrasound screening exam.  Low Dose CT Chest recommended if Age 28-80 years, 30 pack-year currently smoking OR have quit w/in 15years.   Lifestyle risk factor issued reviewed: Diet, exercise, weight management, advised patient smoking is not healthy, nutrition/diet.  Preventative health measures discussed (5-10 year plan).  Reviewed and recommended vaccinations: - Pneumovax  - Prevnar  - Annual Influenza - Zostavax - Tdap   Depression screening: Done Fall risk screening: Done Discuss ADLs/IADLs: Done  Current medical providers: See HPI  Other health risk factors identified this visit: No other issues Cognitive impairment issues: None identified  All above discussed with patient.  Appropriate education, counseling and referral will be made based upon the above.   2. Deep vein thrombosis of left lower extremity Asymptomatic and has had anticoagulation for 1 year. Per patient request I will discontinue Xarelto.  3. History of pulmonary embolus (PE) Asymptomatic and has had anticoagulation for 1 year. Per patient request I will discontinue Xarelto.  4. Viral URI with cough May use mucinex at home, advair prn.  5. Coronary artery disease involving native coronary artery of native heart without angina pectoris Needs to continue preventative measures.

## 2015-02-16 ENCOUNTER — Other Ambulatory Visit: Payer: Self-pay | Admitting: Family Medicine

## 2015-02-16 DIAGNOSIS — I10 Essential (primary) hypertension: Secondary | ICD-10-CM

## 2015-03-03 ENCOUNTER — Encounter: Payer: Self-pay | Admitting: Family Medicine

## 2015-03-10 ENCOUNTER — Encounter: Payer: Self-pay | Admitting: Family Medicine

## 2015-03-10 ENCOUNTER — Ambulatory Visit (INDEPENDENT_AMBULATORY_CARE_PROVIDER_SITE_OTHER): Payer: Commercial Managed Care - HMO | Admitting: Family Medicine

## 2015-03-10 VITALS — BP 118/62 | HR 77 | Temp 98.2°F | Resp 16 | Ht 65.0 in | Wt 161.4 lb

## 2015-03-10 DIAGNOSIS — I1310 Hypertensive heart and chronic kidney disease without heart failure, with stage 1 through stage 4 chronic kidney disease, or unspecified chronic kidney disease: Secondary | ICD-10-CM

## 2015-03-10 DIAGNOSIS — E7800 Pure hypercholesterolemia, unspecified: Secondary | ICD-10-CM

## 2015-03-10 DIAGNOSIS — Z23 Encounter for immunization: Secondary | ICD-10-CM

## 2015-03-10 DIAGNOSIS — N183 Chronic kidney disease, stage 3 unspecified: Secondary | ICD-10-CM

## 2015-03-10 DIAGNOSIS — Z0000 Encounter for general adult medical examination without abnormal findings: Secondary | ICD-10-CM | POA: Insufficient documentation

## 2015-03-10 NOTE — Progress Notes (Signed)
Name: [name] [name]   MRN: [id number]    DOB: 02-09-1928   Date:03/10/2015       Progress Note  Subjective  Chief Complaint  Chief Complaint  Patient presents with  . Annual Exam    HPI  Patient is here today for a Male Medicare Wellness Visit:  The patient has been in otherwise good general health and voices no acute concerns today.   Hyperlipidemia: Patient presents with hyperlipidemia.  He was tested because he has coronary artery disease.  His last labs showed controled lipid panel on Crestor 5 mg he reports he is taking 1/2 tablet a day.  He denies worsening SOB, chest pain, palpitations, pre-syncopal episodes. There is a family history of hyperlipidemia. There is a family history of early ischemia heart disease.  Hypertension: Patient here for follow-up of elevated blood pressure. He is not exercising and is adherent to low salt diet.  Blood pressure is well controlled at home. Cardiac symptoms none. Patient denies chest pain, chest pressure/discomfort, claudication, dyspnea, exertional chest pressure/discomfort, irregular heart beat, lower extremity edema, near-syncope, palpitations, syncope and tachypnea.  Cardiovascular risk factors: advanced age (older than 30 for men, 49 for women), dyslipidemia, family history of premature cardiovascular disease, hypertension, male gender and sedentary lifestyle. Use of agents associated with hypertension: none. History of target organ damage: chronic kidney disease and prior coronary revascularization.        Past Medical History  Diagnosis Date  . Arthritis   . Hyperlipidemia   . Hypertension   . Myocardial infarction   . Pulmonary embolism 01/2014  . Left leg DVT 01/2014    Past Surgical History  Procedure Laterality Date  . Coronary stent placement  2006  . Len replaced Left 2015  . Melanoma excision      40 years ago  . Joint replacement  2011 & 2012    both hips    Family History  Problem Relation Age of Onset  .  Arthritis Mother   . Arthritis Father   . Depression Father     Social History   Social History  . Marital Status: Married    Spouse Name: N/A  . Number of Children: N/A  . Years of Education: N/A   Occupational History  . Not on file.   Social History Main Topics  . Smoking status: Never Smoker   . Smokeless tobacco: Not on file  . Alcohol Use: No  . Drug Use: No  . Sexual Activity: No   Other Topics Concern  . Not on file   Social History Narrative  . No narrative on file     Current outpatient prescriptions:  .  atenolol (TENORMIN) 50 MG tablet, TAKE 1 TABLET EVERY DAY, Disp: 90 tablet, Rfl: 1 .  fluticasone-salmeterol (ADVAIR HFA) 45-21 MCG/ACT inhaler, Inhale into the lungs., Disp: , Rfl:  .  rivaroxaban (XARELTO) 20 MG TABS tablet, Take by mouth., Disp: , Rfl:  .  rosuvastatin (CRESTOR) 5 MG tablet, Take by mouth., Disp: , Rfl:   Allergies  Allergen Reactions  . Aspirin Anaphylaxis, Swelling and Shortness Of Breath    REACTION: terrible bp reaction/wheezing  . Pollen Extract     ragweed  . Rabbit Epithelium     rabbits    Fall Risk: Fall Risk  03/10/2015  Falls in the past year? No  Number falls in past yr: -  Injury with Fall? -  Risk Factor Category  -  Follow up -    Depression  screen [facility] 2/9 03/10/2015 01/16/2015  Decreased Interest 0 0  Down, Depressed, Hopeless 0 0  PHQ - 2 Score 0 0   Functional Status Survey: Is the patient deaf or have difficulty hearing?: Yes Does the patient have difficulty seeing, even when wearing glasses/contacts?: No Does the patient have difficulty concentrating, remembering, or making decisions?: No Does the patient have difficulty walking or climbing stairs?: Yes Does the patient have difficulty dressing or bathing?: No Does the patient have difficulty doing errands alone such as visiting a doctor's office or shopping?: No   ROS  CONSTITUTIONAL: No significant weight changes, fever, chills, weakness or  fatigue.  HEENT:  - Eyes: No visual changes.  - Ears: No auditory changes. No pain.  - Nose: No sneezing, congestion, runny nose. - Throat: No sore throat. No changes in swallowing. SKIN: No rash or itching.  CARDIOVASCULAR: No chest pain, chest pressure or chest discomfort. No palpitations or edema.  RESPIRATORY: No shortness of breath, cough or sputum.  GASTROINTESTINAL: No anorexia, nausea, vomiting. No changes in bowel habits. No abdominal pain or blood.  GENITOURINARY: No dysuria. No frequency. No discharge.  NEUROLOGICAL: No headache, dizziness, syncope, paralysis, ataxia, numbness or tingling in the extremities. No memory changes. No change in bowel or bladder control.  MUSCULOSKELETAL: No joint pain. No muscle pain. HEMATOLOGIC: No anemia, bleeding or bruising.  LYMPHATICS: No enlarged lymph nodes.  PSYCHIATRIC: No change in mood. No change in sleep pattern.  ENDOCRINOLOGIC: No reports of sweating, cold or heat intolerance. No polyuria or polydipsia.   Objective  Filed Vitals:   03/10/15 0827  BP: 118/62  Pulse: 77  Temp: 98.2 F (36.8 C)  Resp: 16  Height: 5\' 5"  (1.651 m)  Weight: 161 lb 6 oz (73.199 kg)  SpO2: 93%   Body mass index is 26.85 kg/(m^2).  Physical Exam  Constitutional: Patient appears well-developed and well-nourished. In no distress. Walking with the use of a cane. HEENT:  - Head: Normocephalic and atraumatic.  - Ears: Bilateral TMs gray, no erythema or effusion - Nose: Nasal mucosa moist - Mouth/Throat: Oropharynx is clear and moist. No tonsillar hypertrophy or erythema. No post nasal drainage.  - Eyes: Conjunctivae clear, EOM movements normal. PERRLA. No scleral icterus.  Neck: Normal range of motion. Neck supple. No JVD present. No thyromegaly present.  Cardiovascular: Normal rate, regular rhythm and normal heart sounds.  No murmur heard.  Pulmonary/Chest: Effort normal and breath sounds normal. No respiratory distress. Musculoskeletal: Normal  range of motion bilateral UE and LE, no joint effusions. Peripheral vascular: Bilateral LE no edema. Neurological: CN II-XII grossly intact with no focal deficits. Alert and oriented to person, place, and time. Coordination, balance, strength, speech and gait are normal.  Skin: Skin is warm and dry. No rash noted. No erythema.  Psychiatric: Patient has a normal mood and affect. Behavior is normal in office today. Judgment and thought content normal in office today.   Assessment & Plan  1. Medicare annual wellness visit, subsequent Functional ability/safety issues: No Issues Hearing issues: Addressed  Activities of daily living: Discussed Home safety issues: No Issues  End Of Life Planning: Offered verbal information regarding advanced directives, healthcare power of attorney.  Preventative care, Health maintenance, Preventative health measures discussed.  Preventative screenings discussed today: lab work, colonoscopy, PSA.  Men age 42 to 65 years if ever smoked recommended to get a one time AAA ultrasound screening exam.  Low Dose CT Chest recommended if Age 73-80 years, 30 pack-year currently smoking  OR have quit w/in 15years.   Lifestyle risk factor issued reviewed: Diet, exercise, weight management, advised patient smoking is not healthy, nutrition/diet.  Preventative health measures discussed (5-10 year plan).  Reviewed and recommended vaccinations: - Pneumovax  - Prevnar  - Annual Influenza - Zostavax - Tdap   Depression screening: Done Fall risk screening: Done Discuss ADLs/IADLs: Done  Current medical providers: See HPI  Other health risk factors identified this visit: No other issues Cognitive impairment issues: None identified  All above discussed with patient. Appropriate education, counseling and referral will be made based upon the above.    2. Need for influenza vaccination  - Flu vaccine HIGH DOSE PF (Fluzone High dose)  3.  Hypercholesteremia Clinically stable findings based on clinical exam and on review of any pertinent results. Recommended to patient that they continue their current regimen with regular follow ups.  - Lipid panel  4. Hypertensive heart/kidney disease without HF and with CKD stage III JNC-8 vs ADA treatment goals discussed with patient. Current blood pressure well controled. Continue current regimen of anti-hypertensive medications.   - CBC with Differential/Platelet - Comprehensive metabolic panel

## 2015-03-13 DIAGNOSIS — N183 Chronic kidney disease, stage 3 (moderate): Secondary | ICD-10-CM | POA: Diagnosis not present

## 2015-03-13 DIAGNOSIS — I1310 Hypertensive heart and chronic kidney disease without heart failure, with stage 1 through stage 4 chronic kidney disease, or unspecified chronic kidney disease: Secondary | ICD-10-CM | POA: Diagnosis not present

## 2015-03-13 DIAGNOSIS — E780 Pure hypercholesterolemia: Secondary | ICD-10-CM | POA: Diagnosis not present

## 2015-03-14 LAB — CBC WITH DIFFERENTIAL/PLATELET
BASOS: 0 %
Basophils Absolute: 0 10*3/uL (ref 0.0–0.2)
EOS (ABSOLUTE): 0.4 10*3/uL (ref 0.0–0.4)
EOS: 5 %
HEMATOCRIT: 36.5 % — AB (ref 37.5–51.0)
HEMOGLOBIN: 11.5 g/dL — AB (ref 12.6–17.7)
IMMATURE GRANULOCYTES: 0 %
Immature Grans (Abs): 0 10*3/uL (ref 0.0–0.1)
LYMPHS ABS: 1.6 10*3/uL (ref 0.7–3.1)
Lymphs: 23 %
MCH: 26 pg — ABNORMAL LOW (ref 26.6–33.0)
MCHC: 31.5 g/dL (ref 31.5–35.7)
MCV: 82 fL (ref 79–97)
MONOCYTES: 11 %
MONOS ABS: 0.8 10*3/uL (ref 0.1–0.9)
Neutrophils Absolute: 4.3 10*3/uL (ref 1.4–7.0)
Neutrophils: 61 %
Platelets: 241 10*3/uL (ref 150–379)
RBC: 4.43 x10E6/uL (ref 4.14–5.80)
RDW: 15.7 % — AB (ref 12.3–15.4)
WBC: 7 10*3/uL (ref 3.4–10.8)

## 2015-03-14 LAB — COMPREHENSIVE METABOLIC PANEL
A/G RATIO: 1.6 (ref 1.1–2.5)
ALT: 8 IU/L (ref 0–44)
AST: 13 IU/L (ref 0–40)
Albumin: 4.4 g/dL (ref 3.5–4.7)
Alkaline Phosphatase: 83 IU/L (ref 39–117)
BUN/Creatinine Ratio: 15 (ref 10–22)
BUN: 27 mg/dL (ref 8–27)
Bilirubin Total: 0.3 mg/dL (ref 0.0–1.2)
CALCIUM: 9.2 mg/dL (ref 8.6–10.2)
CO2: 21 mmol/L (ref 18–29)
CREATININE: 1.77 mg/dL — AB (ref 0.76–1.27)
Chloride: 104 mmol/L (ref 97–108)
GFR calc Af Amer: 40 mL/min/{1.73_m2} — ABNORMAL LOW (ref >59)
GFR, EST NON AFRICAN AMERICAN: 35 mL/min/{1.73_m2} — AB (ref >59)
GLOBULIN, TOTAL: 2.8 g/dL (ref 1.5–4.5)
Glucose: 106 mg/dL — ABNORMAL HIGH (ref 65–99)
POTASSIUM: 4.4 mmol/L (ref 3.5–5.2)
Sodium: 141 mmol/L (ref 134–144)
Total Protein: 7.2 g/dL (ref 6.0–8.5)

## 2015-03-14 LAB — LIPID PANEL
CHOL/HDL RATIO: 6.9 ratio — AB (ref 0.0–5.0)
Cholesterol, Total: 179 mg/dL (ref 100–199)
HDL: 26 mg/dL — ABNORMAL LOW (ref >39)
LDL CALC: 91 mg/dL (ref 0–99)
Triglycerides: 310 mg/dL — ABNORMAL HIGH (ref 0–149)
VLDL Cholesterol Cal: 62 mg/dL — ABNORMAL HIGH (ref 5–40)

## 2015-05-11 DIAGNOSIS — Z961 Presence of intraocular lens: Secondary | ICD-10-CM | POA: Diagnosis not present

## 2015-06-28 ENCOUNTER — Emergency Department: Payer: Commercial Managed Care - HMO

## 2015-06-28 ENCOUNTER — Emergency Department
Admission: EM | Admit: 2015-06-28 | Discharge: 2015-06-28 | Disposition: A | Discharge status: Home / Self Care | Payer: Commercial Managed Care - HMO | Attending: Emergency Medicine | Admitting: Emergency Medicine

## 2015-06-28 DIAGNOSIS — J219 Acute bronchiolitis, unspecified: Secondary | ICD-10-CM | POA: Diagnosis not present

## 2015-06-28 DIAGNOSIS — I1310 Hypertensive heart and chronic kidney disease without heart failure, with stage 1 through stage 4 chronic kidney disease, or unspecified chronic kidney disease: Secondary | ICD-10-CM | POA: Diagnosis not present

## 2015-06-28 DIAGNOSIS — J209 Acute bronchitis, unspecified: Secondary | ICD-10-CM | POA: Diagnosis not present

## 2015-06-28 DIAGNOSIS — R05 Cough: Secondary | ICD-10-CM | POA: Diagnosis not present

## 2015-06-28 DIAGNOSIS — Z79899 Other long term (current) drug therapy: Secondary | ICD-10-CM | POA: Diagnosis not present

## 2015-06-28 DIAGNOSIS — N183 Chronic kidney disease, stage 3 (moderate): Secondary | ICD-10-CM | POA: Insufficient documentation

## 2015-06-28 DIAGNOSIS — R0602 Shortness of breath: Secondary | ICD-10-CM | POA: Diagnosis not present

## 2015-06-28 LAB — BASIC METABOLIC PANEL
ANION GAP: 8 (ref 5–15)
BUN: 20 mg/dL (ref 6–20)
CHLORIDE: 108 mmol/L (ref 101–111)
CO2: 21 mmol/L — ABNORMAL LOW (ref 22–32)
Calcium: 9.5 mg/dL (ref 8.9–10.3)
Creatinine, Ser: 1.72 mg/dL — ABNORMAL HIGH (ref 0.61–1.24)
GFR calc Af Amer: 40 mL/min — ABNORMAL LOW (ref >60)
GFR, EST NON AFRICAN AMERICAN: 34 mL/min — AB (ref >60)
Glucose, Bld: 106 mg/dL — ABNORMAL HIGH (ref 65–99)
POTASSIUM: 4.4 mmol/L (ref 3.5–5.1)
SODIUM: 137 mmol/L (ref 135–145)

## 2015-06-28 LAB — CBC
HCT: 38.2 % — ABNORMAL LOW (ref 40.0–52.0)
HEMOGLOBIN: 12.3 g/dL — AB (ref 13.0–18.0)
MCH: 25.6 pg — AB (ref 26.0–34.0)
MCHC: 32.1 g/dL (ref 32.0–36.0)
MCV: 79.9 fL — AB (ref 80.0–100.0)
PLATELETS: 215 10*3/uL (ref 150–440)
RBC: 4.78 MIL/uL (ref 4.40–5.90)
RDW: 15.9 % — ABNORMAL HIGH (ref 11.5–14.5)
WBC: 7.7 10*3/uL (ref 3.8–10.6)

## 2015-06-28 MED ORDER — ALBUTEROL SULFATE HFA 108 (90 BASE) MCG/ACT IN AERS: 2.0000 | INHALATION_SPRAY | Freq: Four times a day (QID) | RESPIRATORY_TRACT | Status: DC | PRN

## 2015-06-28 MED ORDER — IPRATROPIUM-ALBUTEROL 0.5-2.5 (3) MG/3ML IN SOLN
3.0000 mL | Freq: Once | RESPIRATORY_TRACT | Status: AC
  Administered 2015-06-28: 3 mL via RESPIRATORY_TRACT

## 2015-06-28 MED ORDER — AZITHROMYCIN 250 MG PO TABS: ORAL_TABLET | ORAL | Status: DC

## 2015-06-28 NOTE — ED Notes (Addendum)
Pt states that he thinks he has pneumonia. Pt reports very mild SOB with exertion. Pt reports runny nose X 2 weeks, body aches, nonproductive cough. Pt ambulatory. Pt alert and oriented X4, active, cooperative, pt in NAD. RR even and unlabored, color WNL.

## 2015-06-28 NOTE — ED Notes (Signed)
Pt discharged home after verbalizing understanding of discharge instructions; nad noted. 

## 2015-06-28 NOTE — ED Notes (Signed)
Pt from home with nasal congestion x 2.5 months and cough x 1.5 months. Pt reports that his wife made him come today because he is worse today and he has been coughing so hard he vomits. Pt alert & oriented with NAD noted.

## 2015-06-28 NOTE — ED Provider Notes (Signed)
Time Seen: Approximately 1606  I have reviewed the triage notes  Chief Complaint: Pneumonia   History of Present Illness: [name] [name] is a 81 y.o. male who is a very pleasant individual stating recent history of runny nose, persistent occasionally productive cough with yellow sputum, and some bilateral flank pain with coughing. States he's had some nasal congestion now for an extensive period of time and cough recently as increased and has become more productive in nature. He has not had no objective fever at home. He does describe some shortness of breath and is been taking over-the-counter Mucinex intermittently without any effect. He does not currently smoke though he states he did the past approximately 30 years ago. He denies any anterior chest pain. Denies any nausea, vomiting, diarrhea. Patient arrives with concerns that he may have pneumonia.   Past Medical History  Diagnosis Date  . Arthritis   . Hyperlipidemia   . Hypertension   . Myocardial infarction (HCC)   . Pulmonary embolism (HCC) 01/2014  . Left leg DVT (HCC) 01/2014    Patient Active Problem List   Diagnosis Date Noted  . Annual physical exam 03/10/2015  . Need for influenza vaccination 03/10/2015  . History of anticoagulant therapy 01/16/2015  . History of pulmonary embolus (PE) 01/16/2015  . Glaucoma 01/16/2015  . H/O malignant neoplasm of skin 01/16/2015  . Hypercholesteremia 01/16/2015  . Hypertension goal BP (blood pressure) < 140/90 01/16/2015  . Hypertensive heart/kidney disease without HF and with CKD stage III 01/16/2015  . Calculus of kidney 01/16/2015  . Deep vein thrombosis of left lower extremity (HCC) 01/16/2015  . Presence of stent in coronary artery 01/16/2015  . Pruritic dermatitis 01/16/2015  . Breathlessness on exertion 01/16/2015  . Coronary artery disease involving native coronary artery of native heart without angina pectoris 10/20/2008    Past Surgical History  Procedure  Laterality Date  . Coronary stent placement  2006  . Len replaced Left 2015  . Melanoma excision      40 years ago  . Joint replacement  2011 & 2012    both hips    Past Surgical History  Procedure Laterality Date  . Coronary stent placement  2006  . Len replaced Left 2015  . Melanoma excision      40 years ago  . Joint replacement  2011 & 2012    both hips    Current Outpatient Rx  Name  Route  Sig  Dispense  Refill  . albuterol (PROVENTIL HFA;VENTOLIN HFA) 108 (90 Base) MCG/ACT inhaler   Inhalation   Inhale 2 puffs into the lungs every 6 (six) hours as needed for wheezing or shortness of breath.   1 Inhaler   2   . atenolol (TENORMIN) 50 MG tablet      TAKE 1 TABLET EVERY DAY   90 tablet   1   . azithromycin (ZITHROMAX Z-PAK) 250 MG tablet      Take 2 tablets (500 mg) on  Day 1,  followed by 1 tablet (250 mg) once daily on Days 2 through 5.   6 each   0   . fluticasone-salmeterol (ADVAIR HFA) 45-21 MCG/ACT inhaler   Inhalation   Inhale into the lungs.         . rivaroxaban (XARELTO) 20 MG TABS tablet   Oral   Take by mouth.         . rosuvastatin (CRESTOR) 5 MG tablet   Oral   Take by mouth.  Allergies:  Aspirin; Pollen extract; and Rabbit epithelium  Family History: Family History  Problem Relation Age of Onset  . Arthritis Mother   . Arthritis Father   . Depression Father     Social History: Social History  Substance Use Topics  . Smoking status: Never Smoker   . Smokeless tobacco: None  . Alcohol Use: No     Review of Systems:   10 point review of systems was performed and was otherwise negative:  Constitutional: No fever Eyes: No visual disturbances ENT: No sore throat, ear pain Cardiac: No anterior chest pain. No arm or jaw pain. Respiratory: Patient's had some wheezing at home. He states he has some mild shortness of breath but is been able to do his normal day-to-day activities Abdomen: No abdominal pain, no  vomiting, No diarrhea Endocrine: No weight loss, No night sweats Extremities: No peripheral edema, cyanosis Skin: No rashes, easy bruising Neurologic: No focal weakness, trouble with speech or swollowing Urologic: No dysuria, Hematuria, or urinary frequency   Physical Exam:  ED Triage Vitals  Enc Vitals Group     BP 06/28/15 1417 135/64 mmHg     Pulse Rate 06/28/15 1417 79     Resp 06/28/15 1417 18     Temp 06/28/15 1417 98.8 F (37.1 C)     Temp Source 06/28/15 1417 Oral     SpO2 06/28/15 1417 95 %     Weight 06/28/15 1417 160 lb (72.576 kg)     Height 06/28/15 1417  (1.702 m)     Head Cir --      Peak Flow --      Pain Score 06/28/15 1417 5     Pain Loc --      Pain Edu? --      Excl. in GC? --     General: Awake , Alert , and Oriented times 3; GCS 15 Head: Normal cephalic , atraumatic Eyes: Pupils equal , round, reactive to light Nose/Throat: No nasal drainage, patent upper airway without erythema or exudate.  Neck: Supple, Full range of motion, No anterior adenopathy or palpable thyroid masses Lungs: Patient has diffuse wheezing specially auscultated bilaterally at the bases and the wheezing is symmetric , no rhonchi, or rales Heart: Regular rate, regular rhythm without murmurs , gallops , or rubs Abdomen: Soft, non tender without rebound, guarding , or rigidity; bowel sounds positive and symmetric in all 4 quadrants. No organomegaly .        Extremities: 2 plus symmetric pulses. No edema, clubbing or cyanosis negative Homans sign Neurologic: normal ambulation, Motor symmetric without deficits, sensory intact Skin: warm, dry, no rashes   Labs:   All laboratory work was reviewed including any pertinent negatives or positives listed below:  Labs Reviewed  CBC - Abnormal; Notable for the following:    Hemoglobin 12.3 (*)    HCT 38.2 (*)    MCV 79.9 (*)    MCH 25.6 (*)    RDW 15.9 (*)    All other components within normal limits  BASIC METABOLIC PANEL -  Abnormal; Notable for the following:    CO2 21 (*)    Glucose, Bld 106 (*)    Creatinine, Ser 1.72 (*)    GFR calc non Af Amer 34 (*)    GFR calc Af Amer 40 (*)    All other components within normal limits   laboratory work shows some consistent renal insufficiency  EKG:  ED ECG REPORT I, Jennye Moccasin,  the attending physician, personally viewed and interpreted this ECG.  Date: 06/28/2015 EKG Time: *1425 Rate: 79 Rhythm: normal sinus rhythm QRS Axis: normal Intervals: normal ST/T Wave abnormalities: normal Conduction Disutrbances: none Narrative Interpretation: unremarkable    Radiology:     EXAM: CHEST 2 VIEW  COMPARISON: 10/11/2014  FINDINGS: Chronic scarring in the right upper lobe/right mid lung. No focal consolidation. No pleural effusion or pneumothorax.  The heart is normal in size.  Degenerative changes of the visualized thoracolumbar spine.  Stable mild compression fracture deformity at T12.  IMPRESSION: No evidence of acute cardiopulmonary disease.      I personally reviewed the radiologic studies     ED Course: * Patient was given a DuoNeb breathing treatment with symptomatic improvement. He most likely has bronchitis with bronchospasm either from a viral or environmental sore specially given the length of time of his symptoms. Since his cough is productive of felt he would benefit with antibiotic therapy but cautioned that there is no pneumonia present on exam, laboratory work, or x-ray evaluation. The patient was advised follow-up with his primary physician and recommended over-the-counter Afrin nasal spray for a couple of days only to help with the nasal discharge.   Assessment:  Acute bronchitis with bronchospasm*   Final Clinical Impression:   Final diagnoses:  Bronchitis with bronchospasm     Plan Outpatient management Patient was advised to return immediately if condition worsens. Patient was advised to follow up with  their primary care physician or other specialized physicians involved in their outpatient care *            Jennye MoccasinBrian S Quigley, MD 06/28/15 [id number]

## 2015-07-05 ENCOUNTER — Ambulatory Visit (INDEPENDENT_AMBULATORY_CARE_PROVIDER_SITE_OTHER): Payer: Commercial Managed Care - HMO | Admitting: Family Medicine

## 2015-07-05 ENCOUNTER — Encounter: Payer: Self-pay | Admitting: Family Medicine

## 2015-07-05 VITALS — BP 110/58 | HR 70 | Temp 98.0°F | Resp 16 | Ht 67.0 in | Wt 158.0 lb

## 2015-07-05 DIAGNOSIS — J40 Bronchitis, not specified as acute or chronic: Secondary | ICD-10-CM

## 2015-07-05 DIAGNOSIS — J069 Acute upper respiratory infection, unspecified: Secondary | ICD-10-CM | POA: Insufficient documentation

## 2015-07-05 DIAGNOSIS — R05 Cough: Secondary | ICD-10-CM | POA: Diagnosis not present

## 2015-07-05 DIAGNOSIS — R059 Cough, unspecified: Secondary | ICD-10-CM | POA: Insufficient documentation

## 2015-07-05 DIAGNOSIS — J209 Acute bronchitis, unspecified: Secondary | ICD-10-CM

## 2015-07-05 MED ORDER — FLUTICASONE-SALMETEROL 250-50 MCG/DOSE IN AEPB: 1.0000 | INHALATION_SPRAY | Freq: Two times a day (BID) | RESPIRATORY_TRACT | Status: DC

## 2015-07-05 MED ORDER — AZITHROMYCIN 500 MG PO TABS: 500.0000 mg | ORAL_TABLET | Freq: Every day | ORAL | Status: DC

## 2015-07-05 MED ORDER — BENZONATATE 200 MG PO CAPS: 200.0000 mg | ORAL_CAPSULE | Freq: Three times a day (TID) | ORAL | Status: DC | PRN

## 2015-07-05 NOTE — Progress Notes (Signed)
Name: [name] [name]   MRN: [id number]    DOB: 10-26-1927   Date:07/05/2015       Progress Note  Subjective  Chief Complaint  Chief Complaint  Patient presents with  . Follow-up  . Bronchitis    cough, runny nose unchanged    HPI  [name] [name] is a 82 y.o. male who is here for ER follow up. Seen in ER on 06/28/15 for symptoms of runny nose, persistent occasionally productive cough with yellow sputum, and some bilateral flank pain with coughing. He did describe some shortness of breath and was diagnosed with bronchitis. Prescribed Zpak and albuterol inhaler. He reports no change in symptoms. Still coughing. Sputum production reduced.   He does not currently smoke though he did in the past approximately 30 years ago.   CXR unremarkable for acute findings on 06/28/15.   Past Medical History  Diagnosis Date  . Arthritis   . Hyperlipidemia   . Hypertension   . Myocardial infarction ([place])   . Pulmonary embolism ([place]) 01/2014  . Left leg DVT ([place]) 01/2014    Patient Active Problem List   Diagnosis Date Noted  . Upper respiratory infection 07/05/2015  . Cough 07/05/2015  . Annual physical exam 03/10/2015  . Need for influenza vaccination 03/10/2015  . History of anticoagulant therapy 01/16/2015  . History of pulmonary embolus (PE) 01/16/2015  . Glaucoma 01/16/2015  . H/O malignant neoplasm of skin 01/16/2015  . Hypercholesteremia 01/16/2015  . Hypertension goal BP (blood pressure) < 140/90 01/16/2015  . Hypertensive heart/kidney disease without HF and with CKD stage III 01/16/2015  . Calculus of kidney 01/16/2015  . Deep vein thrombosis of left lower extremity ([place]) 01/16/2015  . Presence of stent in coronary artery 01/16/2015  . Pruritic dermatitis 01/16/2015  . Breathlessness on exertion 01/16/2015  . Coronary artery disease involving native coronary artery of native heart without angina pectoris 10/20/2008    Social History  Substance Use Topics  . Smoking status:  Never Smoker   . Smokeless tobacco: Not on file  . Alcohol Use: No     Current outpatient prescriptions:  .  albuterol (PROVENTIL HFA;VENTOLIN HFA) 108 (90 Base) MCG/ACT inhaler, Inhale 2 puffs into the lungs every 6 (six) hours as needed for wheezing or shortness of breath., Disp: 1 Inhaler, Rfl: 2 .  atenolol (TENORMIN) 50 MG tablet, TAKE 1 TABLET EVERY DAY, Disp: 90 tablet, Rfl: 1 .  rivaroxaban (XARELTO) 20 MG TABS tablet, Take by mouth., Disp: , Rfl:  .  rosuvastatin (CRESTOR) 5 MG tablet, Take by mouth., Disp: , Rfl:   Past Surgical History  Procedure Laterality Date  . Coronary stent placement  2006  . Len replaced Left 2015  . Melanoma excision      40 years ago  . Joint replacement  2011 & 2012    both hips    Family History  Problem Relation Age of Onset  . Arthritis Mother   . Arthritis Father   . Depression Father     Allergies  Allergen Reactions  . Aspirin Anaphylaxis, Swelling and Shortness Of Breath    REACTION: terrible bp reaction/wheezing  . Pollen Extract     ragweed  . Rabbit Epithelium     rabbits     Review of Systems  CONSTITUTIONAL: No significant weight changes, fever, chills, weakness or fatigue.  HEENT:  - Eyes: No visual changes.  - Ears: No auditory changes. No pain.  - Nose: Yes congestion. - Throat: No  sore throat. No changes in swallowing. SKIN: No rash or itching.  CARDIOVASCULAR: No chest pain, chest pressure or chest discomfort. No palpitations or edema.  RESPIRATORY: No shortness of breath. Yes cough.   GASTROINTESTINAL: No anorexia, nausea, vomiting. No changes in bowel habits. No abdominal pain or blood.  NEUROLOGICAL: No headache, dizziness, syncope, paralysis, ataxia, numbness or tingling in the extremities. No memory changes. No change in bowel or bladder control.  MUSCULOSKELETAL: No joint pain. No muscle pain.    Objective  BP 110/58 mmHg  Pulse 70  Temp(Src) 98 F (36.7 C) (Oral)  Resp 16  Ht _0  (1.702 m)   Wt 158 lb (71.668 kg)  BMI 24.74 kg/m2  SpO2 95% Body mass index is 24.74 kg/(m^2).  Physical Exam  Constitutional: Patient appears well-developed and well-nourished. In no distress. Walks with the assistance of a cane. HEENT:  - Head: Normocephalic and atraumatic.  - Ears: Bilateral TMs gray, no erythema or effusion - Nose: Nasal mucosa moist, boggy congested. - Mouth/Throat: Oropharynx is clear and moist. No tonsillar hypertrophy or erythema. No post nasal drainage.  - Eyes: Conjunctivae clear, EOM movements normal. PERRLA. No scleral icterus.  Neck: Normal range of motion. Neck supple. No JVD present. No thyromegaly present.  Cardiovascular: Normal rate, regular rhythm and normal heart sounds.  No murmur heard.  Pulmonary/Chest: Effort normal and breath sounds reduced tidal volume with scattered rhonchi on expiration.  Psychiatric: Patient has a normal mood and affect. Behavior is normal in office today. Judgment and thought content normal in office today.   Recent Results (from the past 2160 hour(s))  CBC     Status: Abnormal   Collection Time: 06/28/15  2:20 PM  Result Value Ref Range   WBC 7.7 3.8 - 10.6 K/uL   RBC 4.78 4.40 - 5.90 MIL/uL   Hemoglobin 12.3 (L) 13.0 - 18.0 g/dL   HCT 38.2 (L) 40.0 - 52.0 %   MCV 79.9 (L) 80.0 - 100.0 fL   MCH 25.6 (L) 26.0 - 34.0 pg   MCHC 32.1 32.0 - 36.0 g/dL   RDW 15.9 (H) 11.5 - 14.5 %   Platelets 215 150 - 440 K/uL  Basic metabolic panel     Status: Abnormal   Collection Time: 06/28/15  2:20 PM  Result Value Ref Range   Sodium 137 135 - 145 mmol/L   Potassium 4.4 3.5 - 5.1 mmol/L   Chloride 108 101 - 111 mmol/L   CO2 21 (L) 22 - 32 mmol/L   Glucose, Bld 106 (H) 65 - 99 mg/dL   BUN 20 6 - 20 mg/dL   Creatinine, Ser 1.72 (H) 0.61 - 1.24 mg/dL   Calcium 9.5 8.9 - 10.3 mg/dL   GFR calc non Af Amer 34 (L) >60 mL/min   GFR calc Af Amer 40 (L) >60 mL/min    Comment: (NOTE) The eGFR has been calculated using the CKD EPI  equation. This calculation has not been validated in all clinical situations. eGFR's persistently <60 mL/min signify possible Chronic Kidney Disease.    Anion gap 8 5 - 15     Assessment & Plan  1. Bronchitis with bronchospasm Still having congestion in his lungs, propensity for reactive airway in the past as well. Will extend his abx for 5 days (total 10 day course) and start him on inhaled Advair.   - azithromycin (ZITHROMAX) 500 MG tablet; Take 1 tablet (500 mg total) by mouth daily.  Dispense: 5 tablet; Refill: 0 - Fluticasone-Salmeterol (  ADVAIR DISKUS) 250-50 MCG/DOSE AEPB; Inhale 1 puff into the lungs 2 (two) times daily.  Dispense: 1 each; Refill: 3 - benzonatate (TESSALON) 200 MG capsule; Take 1 capsule (200 mg total) by mouth 3 (three) times daily as needed for cough.  Dispense: 30 capsule; Refill: 0  2. Cough  - azithromycin (ZITHROMAX) 500 MG tablet; Take 1 tablet (500 mg total) by mouth daily.  Dispense: 5 tablet; Refill: 0 - Fluticasone-Salmeterol (ADVAIR DISKUS) 250-50 MCG/DOSE AEPB; Inhale 1 puff into the lungs 2 (two) times daily.  Dispense: 1 each; Refill: 3 - benzonatate (TESSALON) 200 MG capsule; Take 1 capsule (200 mg total) by mouth 3 (three) times daily as needed for cough.  Dispense: 30 capsule; Refill: 0

## 2015-07-05 NOTE — Patient Instructions (Signed)
1) Start purple Advair discus inhaler morning and evening 2) Continue other inhaler you have at home every 4-6 hrs and wean down to stop 3) Start Azithromycin 500 mg one a day 4) May use cough pill, Benzonatate as needed for cough.

## 2015-07-16 ENCOUNTER — Other Ambulatory Visit: Payer: Self-pay | Admitting: Family Medicine

## 2015-09-06 ENCOUNTER — Encounter: Payer: Self-pay | Admitting: Family Medicine

## 2015-09-06 ENCOUNTER — Ambulatory Visit (INDEPENDENT_AMBULATORY_CARE_PROVIDER_SITE_OTHER): Payer: Commercial Managed Care - HMO | Admitting: Family Medicine

## 2015-09-06 VITALS — BP 116/68 | HR 69 | Temp 98.2°F | Resp 14 | Wt 159.0 lb

## 2015-09-06 DIAGNOSIS — I1310 Hypertensive heart and chronic kidney disease without heart failure, with stage 1 through stage 4 chronic kidney disease, or unspecified chronic kidney disease: Secondary | ICD-10-CM | POA: Diagnosis not present

## 2015-09-06 DIAGNOSIS — I2510 Atherosclerotic heart disease of native coronary artery without angina pectoris: Secondary | ICD-10-CM

## 2015-09-06 DIAGNOSIS — N183 Chronic kidney disease, stage 3 unspecified: Secondary | ICD-10-CM

## 2015-09-06 DIAGNOSIS — I10 Essential (primary) hypertension: Secondary | ICD-10-CM | POA: Diagnosis not present

## 2015-09-06 DIAGNOSIS — E785 Hyperlipidemia, unspecified: Secondary | ICD-10-CM

## 2015-09-06 NOTE — Progress Notes (Signed)
Name: [name] [name]   MRN: [id number]    DOB: 03/02/1928   Date:09/06/2015       Progress Note  Subjective  Chief Complaint  Chief Complaint  Patient presents with  . Medication Refill  . Hypertension  . Hyperlipidemia    HPI  [name] [name] is a 82 year old male with known history of CAD with MI in the past, HTN, HLD, more recent PE from DVT in left leg (finished anticoagulant therapy). Propensity for reactive airway when he has a cold or bronchitis. Using albuterol inhaler only if needed. Today he is here for general follow up and refill of medications. Everything is at baseline, no new complaints.  Hyperlipidemia: Patient presents with hyperlipidemia. He was tested because he has coronary artery disease. His last labs showed controled lipid panel on Crestor 5 mg he reports he is taking 1/2 tablet a day. He denies worsening SOB, chest pain, palpitations, pre-syncopal episodes. There is a family history of hyperlipidemia. There is a family history of early ischemia heart disease.  Hypertension: Patient here for follow-up of elevated blood pressure. He is not exercising and is adherent to low salt diet. Blood pressure is well controlled at home. Cardiac symptoms none. Patient denies chest pain, chest pressure/discomfort, claudication, dyspnea, exertional chest pressure/discomfort, irregular heart beat, lower extremity edema, near-syncope, palpitations, syncope and tachypnea. Cardiovascular risk factors: advanced age (older than 79 for men, 42 for women), dyslipidemia, family history of premature cardiovascular disease, hypertension, male gender and sedentary lifestyle. Use of agents associated with hypertension: none. History of target organ damage: chronic kidney disease and prior coronary revascularization.  Daughter might be moving him to [place] with her. Will be closer to other children as well.  Past Medical History  Diagnosis Date  . Arthritis   . Hyperlipidemia   .  Hypertension   . Myocardial infarction ([facility])   . Pulmonary embolism ([facility]) 01/2014  . Left leg DVT ([place]) 01/2014    Patient Active Problem List   Diagnosis Date Noted  . History of anticoagulant therapy 01/16/2015  . History of pulmonary embolus (PE) 01/16/2015  . Glaucoma 01/16/2015  . H/O malignant neoplasm of skin 01/16/2015  . Hyperlipidemia LDL goal <70 01/16/2015  . Hypertension goal BP (blood pressure) < 140/90 01/16/2015  . Hypertensive heart/kidney disease without HF and with CKD stage III 01/16/2015  . Calculus of kidney 01/16/2015  . Personal history of DVT (deep vein thrombosis) 01/16/2015  . Presence of stent in coronary artery 01/16/2015  . Pruritic dermatitis 01/16/2015  . Coronary artery disease involving native coronary artery of native heart without angina pectoris 10/20/2008    Social History  Substance Use Topics  . Smoking status: Never Smoker   . Smokeless tobacco: Not on file  . Alcohol Use: No     Current outpatient prescriptions:  .  albuterol (PROVENTIL HFA;VENTOLIN HFA) 108 (90 Base) MCG/ACT inhaler, Inhale 2 puffs into the lungs every 6 (six) hours as needed for wheezing or shortness of breath., Disp: 1 Inhaler, Rfl: 2 .  atenolol (TENORMIN) 50 MG tablet, TAKE 1 TABLET EVERY DAY, Disp: 90 tablet, Rfl: 1 .  Fluticasone-Salmeterol (ADVAIR DISKUS) 250-50 MCG/DOSE AEPB, Inhale 1 puff into the lungs 2 (two) times daily., Disp: 1 each, Rfl: 3 .  rosuvastatin (CRESTOR) 5 MG tablet, TAKE ONE TABLET BY MOUTH ONE TIME DAILY, Disp: 90 tablet, Rfl: 1  Past Surgical History  Procedure Laterality Date  . Coronary stent placement  2006  . Len replaced Left 2015  .  Melanoma excision      40 years ago  . Joint replacement  2011 & 2012    both hips    Family History  Problem Relation Age of Onset  . Arthritis Mother   . Arthritis Father   . Depression Father     Allergies  Allergen Reactions  . Aspirin Anaphylaxis, Swelling and Shortness Of Breath     REACTION: terrible bp reaction/wheezing  . Pollen Extract     ragweed  . Rabbit Epithelium     rabbits     Review of Systems  CONSTITUTIONAL: No significant weight changes, fever, chills, weakness or fatigue.  SKIN: No rash or itching.  CARDIOVASCULAR: No chest pain, chest pressure or chest discomfort. No palpitations or edema.  RESPIRATORY: No shortness of breath, cough or sputum.  NEUROLOGICAL: No headache, dizziness, syncope, paralysis, ataxia, numbness or tingling in the extremities. No memory changes. No change in bowel or bladder control.  MUSCULOSKELETAL: No joint pain. No muscle pain. HEMATOLOGIC: No anemia, bleeding or bruising.  LYMPHATICS: No enlarged lymph nodes.  PSYCHIATRIC: No change in mood. No change in sleep pattern.  ENDOCRINOLOGIC: No reports of sweating, cold or heat intolerance. No polyuria or polydipsia.     Objective  BP 116/68 mmHg  Pulse 69  Temp(Src) 98.2 F (36.8 C) (Oral)  Resp 14  Wt 159 lb (72.122 kg)  SpO2 97% Body mass index is 24.9 kg/(m^2).  Physical Exam  Constitutional: Patient appears well-developed and well-nourished. In no distress.   Cardiovascular: Normal rate, regular rhythm and normal heart sounds.  No murmur heard.  Pulmonary/Chest: Effort normal and breath sounds scattered coarse breath sounds without overt wheezing. No respiratory distress. Musculoskeletal: Normal range of motion bilateral UE and LE, no joint effusions. Peripheral vascular: Bilateral LE no edema. Neurological: CN II-XII grossly intact with no focal deficits. Alert and oriented to person, place, and time. Coordination, balance, strength, speech are normal. Gait is cautious with use of cane. Skin: Skin is warm and dry. Right anterior lower shin area with stable mild erythema dry skin with excoriations. No warmth, drainage, swelling to suggest infection.  Psychiatric: Patient has a normal mood and affect. Behavior is normal in office today. Judgment and thought  content normal in office today.   Recent Results (from the past 2160 hour(s))  CBC     Status: Abnormal   Collection Time: 06/28/15  2:20 PM  Result Value Ref Range   WBC 7.7 3.8 - 10.6 K/uL   RBC 4.78 4.40 - 5.90 MIL/uL   Hemoglobin 12.3 (L) 13.0 - 18.0 g/dL   HCT 38.2 (L) 40.0 - 52.0 %   MCV 79.9 (L) 80.0 - 100.0 fL   MCH 25.6 (L) 26.0 - 34.0 pg   MCHC 32.1 32.0 - 36.0 g/dL   RDW 15.9 (H) 11.5 - 14.5 %   Platelets 215 150 - 440 K/uL  Basic metabolic panel     Status: Abnormal   Collection Time: 06/28/15  2:20 PM  Result Value Ref Range   Sodium 137 135 - 145 mmol/L   Potassium 4.4 3.5 - 5.1 mmol/L   Chloride 108 101 - 111 mmol/L   CO2 21 (L) 22 - 32 mmol/L   Glucose, Bld 106 (H) 65 - 99 mg/dL   BUN 20 6 - 20 mg/dL   Creatinine, Ser 1.72 (H) 0.61 - 1.24 mg/dL   Calcium 9.5 8.9 - 10.3 mg/dL   GFR calc non Af Amer 34 (L) >60 mL/min   GFR  calc Af Amer 40 (L) >60 mL/min    Comment: (NOTE) The eGFR has been calculated using the CKD EPI equation. This calculation has not been validated in all clinical situations. eGFR's persistently <60 mL/min signify possible Chronic Kidney Disease.    Anion gap 8 5 - 15     Assessment & Plan  1. Hypertension goal BP (blood pressure) < 140/90 Well controled. I refilled everything at our last visit.  2. Hypertensive heart/kidney disease without HF and with CKD stage III Recheck labs at f/u if needed.  3. Coronary artery disease involving native coronary artery of native heart without angina pectoris Medication management.  4. Hyperlipidemia LDL goal <70 Continue statin.   He has declined daily steroid inhalation therapy as he does not feel SOB or wheezing. Otherwise chronic Right LE pruritic dermatitis stable.

## 2015-09-07 ENCOUNTER — Ambulatory Visit: Payer: Commercial Managed Care - HMO | Admitting: Family Medicine

## 2015-11-01 ENCOUNTER — Other Ambulatory Visit: Payer: Self-pay

## 2015-11-01 DIAGNOSIS — I10 Essential (primary) hypertension: Secondary | ICD-10-CM

## 2015-11-01 MED ORDER — ATENOLOL 50 MG PO TABS: 50.0000 mg | ORAL_TABLET | Freq: Every day | ORAL | Status: DC

## 2015-11-01 NOTE — Telephone Encounter (Signed)
approved

## 2015-11-03 ENCOUNTER — Other Ambulatory Visit: Payer: Self-pay

## 2015-11-03 DIAGNOSIS — I10 Essential (primary) hypertension: Secondary | ICD-10-CM

## 2015-11-03 MED ORDER — ATENOLOL 50 MG PO TABS: 50.0000 mg | ORAL_TABLET | Freq: Every day | ORAL | Status: DC

## 2015-12-11 ENCOUNTER — Other Ambulatory Visit: Payer: Self-pay

## 2015-12-11 MED ORDER — ROSUVASTATIN CALCIUM 5 MG PO TABS: 2.5000 mg | ORAL_TABLET | Freq: Every day | ORAL | Status: DC

## 2015-12-11 NOTE — Telephone Encounter (Signed)
Last fasting lipid panel from Sept 2016 reviewed; last ALT Sept 2016 also reviewed Last note reviewed; Dr. Sherley BoundsSundaram documented patient only taking half of a pill daily Refill sent Please call pt and ask him to schedule appt with me in the next 3-4 weeks, come fasting for labs Thank you

## 2015-12-12 NOTE — Telephone Encounter (Signed)
Pt scheduled  

## 2015-12-29 ENCOUNTER — Encounter: Payer: Self-pay | Admitting: Family Medicine

## 2015-12-29 ENCOUNTER — Ambulatory Visit (INDEPENDENT_AMBULATORY_CARE_PROVIDER_SITE_OTHER): Payer: Commercial Managed Care - HMO | Admitting: Family Medicine

## 2015-12-29 VITALS — BP 118/62 | HR 67 | Temp 97.9°F | Resp 16 | Wt 158.5 lb

## 2015-12-29 DIAGNOSIS — H9112 Presbycusis, left ear: Secondary | ICD-10-CM | POA: Insufficient documentation

## 2015-12-29 DIAGNOSIS — I2510 Atherosclerotic heart disease of native coronary artery without angina pectoris: Secondary | ICD-10-CM | POA: Diagnosis not present

## 2015-12-29 DIAGNOSIS — H409 Unspecified glaucoma: Secondary | ICD-10-CM | POA: Diagnosis not present

## 2015-12-29 DIAGNOSIS — E785 Hyperlipidemia, unspecified: Secondary | ICD-10-CM

## 2015-12-29 DIAGNOSIS — H9113 Presbycusis, bilateral: Secondary | ICD-10-CM

## 2015-12-29 DIAGNOSIS — I10 Essential (primary) hypertension: Secondary | ICD-10-CM | POA: Diagnosis not present

## 2015-12-29 DIAGNOSIS — Z5181 Encounter for therapeutic drug level monitoring: Secondary | ICD-10-CM | POA: Diagnosis not present

## 2015-12-29 DIAGNOSIS — D649 Anemia, unspecified: Secondary | ICD-10-CM

## 2015-12-29 DIAGNOSIS — H9110 Presbycusis, unspecified ear: Secondary | ICD-10-CM | POA: Insufficient documentation

## 2015-12-29 DIAGNOSIS — N183 Chronic kidney disease, stage 3 unspecified: Secondary | ICD-10-CM

## 2015-12-29 DIAGNOSIS — Z85828 Personal history of other malignant neoplasm of skin: Secondary | ICD-10-CM

## 2015-12-29 HISTORY — DX: Chronic kidney disease, stage 3 unspecified: N18.30

## 2015-12-29 LAB — COMPLETE METABOLIC PANEL WITH GFR
ALBUMIN: 4.4 g/dL (ref 3.6–5.1)
ALT: 10 U/L (ref 9–46)
AST: 17 U/L (ref 10–35)
Alkaline Phosphatase: 70 U/L (ref 40–115)
BILIRUBIN TOTAL: 0.4 mg/dL (ref 0.2–1.2)
BUN: 28 mg/dL — ABNORMAL HIGH (ref 7–25)
CO2: 23 mmol/L (ref 20–31)
CREATININE: 1.8 mg/dL — AB (ref 0.70–1.11)
Calcium: 9.8 mg/dL (ref 8.6–10.3)
Chloride: 105 mmol/L (ref 98–110)
GFR, EST AFRICAN AMERICAN: 39 mL/min — AB (ref >=60)
GFR, Est Non African American: 34 mL/min — ABNORMAL LOW (ref >=60)
GLUCOSE: 99 mg/dL (ref 65–99)
Potassium: 5.3 mmol/L (ref 3.5–5.3)
Sodium: 139 mmol/L (ref 135–146)
TOTAL PROTEIN: 7.6 g/dL (ref 6.1–8.1)

## 2015-12-29 LAB — CBC WITH DIFFERENTIAL/PLATELET
Basophils Absolute: 0 cells/uL (ref 0–200)
Basophils Relative: 0 %
Eosinophils Absolute: 385 cells/uL (ref 15–500)
Eosinophils Relative: 5 %
HEMATOCRIT: 39.6 % (ref 38.5–50.0)
HEMOGLOBIN: 12.6 g/dL — AB (ref 13.2–17.1)
LYMPHS ABS: 1925 {cells}/uL (ref 850–3900)
Lymphocytes Relative: 25 %
MCH: 27.5 pg (ref 27.0–33.0)
MCHC: 31.8 g/dL — AB (ref 32.0–36.0)
MCV: 86.3 fL (ref 80.0–100.0)
MONO ABS: 693 {cells}/uL (ref 200–950)
MPV: 9.4 fL (ref 7.5–12.5)
Monocytes Relative: 9 %
NEUTROS PCT: 61 %
Neutro Abs: 4697 cells/uL (ref 1500–7800)
Platelets: 192 10*3/uL (ref 140–400)
RBC: 4.59 MIL/uL (ref 4.20–5.80)
RDW: 15.7 % — ABNORMAL HIGH (ref 11.0–15.0)
WBC: 7.7 10*3/uL (ref 3.8–10.8)

## 2015-12-29 LAB — LIPID PANEL
Cholesterol: 153 mg/dL (ref 125–200)
HDL: 33 mg/dL — ABNORMAL LOW (ref >=40)
LDL Cholesterol: 70 mg/dL (ref <130)
TRIGLYCERIDES: 250 mg/dL — AB (ref <150)
Total CHOL/HDL Ratio: 4.6 Ratio (ref <=5.0)
VLDL: 50 mg/dL — ABNORMAL HIGH (ref <30)

## 2015-12-29 MED ORDER — ROSUVASTATIN CALCIUM 5 MG PO TABS: 2.5000 mg | ORAL_TABLET | Freq: Every day | ORAL | Status: DC

## 2015-12-29 NOTE — Progress Notes (Signed)
BP 118/62 mmHg  Pulse 67  Temp(Src) 97.9 F (36.6 C) (Oral)  Resp 16  Wt 158 lb 8 oz (71.895 kg)  SpO2 95%   Subjective:    Patient ID: [name] [name], male    DOB: [birth year], 82 y.o.   MRN: [id number]  HPI: [name] Haihomas E Buffin is a 82 y.o. male  Chief Complaint  Patient presents with  . Follow-up  . Referral   Patient is new to me; his previous provider left the practice  Hypertension; on atenolol 50 mg and controlled today  High cholesterol; on crestor 5 mg daily Tries to eat good diet, banana daily; some whole grains; not much cheese Lab Results  Component Value Date   CHOL 179 03/13/2015   CHOL 154 07/11/2010   CHOL 147 02/08/2009   Lab Results  Component Value Date   HDL 26* 03/13/2015   HDL 32.60* 07/11/2010   HDL 26.70* 02/08/2009   Lab Results  Component Value Date   LDLCALC 91 03/13/2015   LDLCALC 89 07/11/2010   LDLCALC 87 11/16/2008   Lab Results  Component Value Date   TRIG 310* 03/13/2015   TRIG 162.0* 07/11/2010   TRIG 211.0* 02/08/2009   Lab Results  Component Value Date   CHOLHDL 6.9* 03/13/2015   CHOLHDL 5 07/11/2010   CHOLHDL 6 02/08/2009   Lab Results  Component Value Date   LDLDIRECT 85.4 02/08/2009   He has had 3 or 4 heart attacks; pain in the chest in the store; the first heart attack, he just stopped, breathed as slow as he could, tried to not get excited, then pain went away; did not get checked out then; then had heart attacks a few years later; has not seen a heart doctor in years; allergic to aspirin; he can't remember the reaction; no chest pain now; has hx of stent  Previous labs showed anemia; he does not know why he was anemic > 6 months ago  Hx of skin cancer; wife wants him to see a skin doctor; he operated and took it off  Sees kidney doctor; has decreased kidney function  Had kidney stone; lots of limestone in the water  Hx of glaucoma, follows [facility]  He has Advair on his med list; he has not  used it in 2-3 months; he does not have COPD on his med list; after discussion of risk of Advair, he agreed to not use it any more at all  [facility] 12/29/2015 09/06/2015 03/10/2015 01/16/2015  Decreased Interest 0 0 0 0  Down, Depressed, Hopeless 0 0 0 0  PHQ - 2 Score 0 0 0 0   Relevant past medical, surgical, family and social history reviewed Past Medical History  Diagnosis Date  . Arthritis   . Hyperlipidemia   . Hypertension   . Myocardial infarction ([facility])   . Pulmonary embolism ([facility]) 01/2014  . Left leg DVT ([facility]) 01/2014  . CKD (chronic kidney disease) stage 3, GFR 30-59 ml/min 12/29/2015   Past Surgical History  Procedure Laterality Date  . Coronary stent placement  2006  . Len replaced Left 2015  . Melanoma excision      40 years ago  . Joint replacement  2011 & 2012    both hips   Family History  Problem Relation Age of Onset  . Arthritis Mother   . Arthritis Father   . Depression Father    Social History  Substance Use Topics  . Smoking  status: Never Smoker   . Smokeless tobacco: None  . Alcohol Use: No   Interim medical history since last visit reviewed. Allergies and medications reviewed  Review of Systems Per HPI unless specifically indicated above     Objective:    BP 118/62 mmHg  Pulse 67  Temp(Src) 97.9 F (36.6 C) (Oral)  Resp 16  Wt 158 lb 8 oz (71.895 kg)  SpO2 95%  Wt Readings from Last 3 Encounters:  12/29/15 158 lb 8 oz (71.895 kg)  09/06/15 159 lb (72.122 kg)  07/05/15 158 lb (71.668 kg)    Physical Exam  Constitutional: He appears well-developed and well-nourished. No distress.  Weight stable  HENT:  Head: Normocephalic and atraumatic.  Eyes: EOM are normal. No scleral icterus.  Neck: No JVD present. Carotid bruit is not present. No thyromegaly present.  Cardiovascular: Normal rate and regular rhythm.   Pulmonary/Chest: Effort normal and breath sounds normal.  Abdominal: Soft. Bowel sounds are normal. He exhibits no  distension.  Musculoskeletal: He exhibits no edema.  Neurological: He is alert.  Skin: Skin is warm and dry. He is not diaphoretic. No pallor. Nails show no clubbing.  Psychiatric: He has a normal mood and affect. His behavior is normal. Judgment and thought content normal. His mood appears not anxious. He does not exhibit a depressed mood.   Results for orders placed or performed during the hospital encounter of 06/28/15  CBC  Result Value Ref Range   WBC 7.7 3.8 - 10.6 K/uL   RBC 4.78 4.40 - 5.90 MIL/uL   Hemoglobin 12.3 (L) 13.0 - 18.0 g/dL   HCT 16.1 (L) 09.6 - 04.5 %   MCV 79.9 (L) 80.0 - 100.0 fL   MCH 25.6 (L) 26.0 - 34.0 pg   MCHC 32.1 32.0 - 36.0 g/dL   RDW 40.9 (H) 81.1 - 91.4 %   Platelets 215 150 - 440 K/uL  Basic metabolic panel  Result Value Ref Range   Sodium 137 135 - 145 mmol/L   Potassium 4.4 3.5 - 5.1 mmol/L   Chloride 108 101 - 111 mmol/L   CO2 21 (L) 22 - 32 mmol/L   Glucose, Bld 106 (H) 65 - 99 mg/dL   BUN 20 6 - 20 mg/dL   Creatinine, Ser 7.82 (H) 0.61 - 1.24 mg/dL   Calcium 9.5 8.9 - 95.6 mg/dL   GFR calc non Af Amer 34 (L) >60 mL/min   GFR calc Af Amer 40 (L) >60 mL/min   Anion gap 8 5 - 15      Assessment & Plan:   Problem List Items Addressed This Visit      Cardiovascular and Mediastinum   Hypertension goal BP (blood pressure) < 140/90    BP well-controlled today      Relevant Medications   rosuvastatin (CRESTOR) 5 MG tablet   Coronary artery disease involving native coronary artery of native heart without angina pectoris - Primary    Offered / recommended cardiology referral; check lipids; cannot take aspirin      Relevant Medications   rosuvastatin (CRESTOR) 5 MG tablet   Other Relevant Orders   Ambulatory referral to Cardiology     Nervous and Auditory   Presbycusis     Genitourinary   CKD (chronic kidney disease) stage 3, GFR 30-59 ml/min    Patient seeing nephrologist; avoid NSAIDs        Other   Medication monitoring  encounter   Relevant Orders   COMPLETE METABOLIC PANEL  WITH GFR   Hyperlipidemia LDL goal <70    Continue statin; avoid / limit saturated fats      Relevant Medications   rosuvastatin (CRESTOR) 5 MG tablet   Other Relevant Orders   Lipid panel   H/O malignant neoplasm of skin    Refer to dermatologist      Relevant Orders   Ambulatory referral to Dermatology   Glaucoma    Follow with eye doctor      Anemia   Relevant Orders   CBC with Differential/Platelet      Follow up plan: Return in about 6 months (around 06/29/2016) for fasting labs and visit.  An after-visit summary was printed and given to the patient at check-out.  Please see the patient instructions which may contain other information and recommendations beyond what is mentioned above in the assessment and plan.  Meds ordered this encounter  Medications  . rosuvastatin (CRESTOR) 5 MG tablet    Sig: Take 0.5 tablets (2.5 mg total) by mouth at bedtime.    Dispense:  15 tablet    Refill:  5    Orders Placed This Encounter  Procedures  . COMPLETE METABOLIC PANEL WITH GFR  . Lipid panel  . CBC with Differential/Platelet  . Ambulatory referral to Cardiology  . Ambulatory referral to Dermatology

## 2015-12-29 NOTE — Assessment & Plan Note (Signed)
Patient seeing nephrologist; avoid NSAIDs

## 2015-12-29 NOTE — Assessment & Plan Note (Signed)
Follow with eye doctor

## 2015-12-29 NOTE — Patient Instructions (Signed)
Try to limit saturated fats in your diet (bologna, hot dogs, barbeque, cheeseburgers, hamburgers, steak, bacon, sausage, cheese, etc.) and get more fresh fruits, vegetables, and whole grains  If you need something for aches or pains, try to use Tylenol (acetaminophen) instead of non-steroidals (which include Aleve, ibuprofen, Advil, Motrin, and naproxen); non-steroidals can cause long-term kidney damage  Let's get labs today I've put in referrals for you to see the heart doctor and the skin doctor If you have not heard anything from my staff in a week about any orders/referrals/studies from today, please contact us here to follow-up [phone]

## 2015-12-29 NOTE — Assessment & Plan Note (Signed)
Refer to dermatologist 

## 2015-12-29 NOTE — Assessment & Plan Note (Signed)
Offered / recommended cardiology referral; check lipids; cannot take aspirin

## 2015-12-29 NOTE — Assessment & Plan Note (Signed)
Continue statin; avoid / limit saturated fats

## 2015-12-29 NOTE — Assessment & Plan Note (Signed)
BP well controlled today.

## 2016-01-03 ENCOUNTER — Telehealth: Payer: Self-pay | Admitting: Emergency Medicine

## 2016-01-03 NOTE — Telephone Encounter (Signed)
Copy of labs and orders sent to patient in the mail at his request. Patient stated he could not understand or hear the phone call.

## 2016-01-16 ENCOUNTER — Other Ambulatory Visit: Payer: Self-pay | Admitting: Family Medicine

## 2016-01-17 NOTE — Telephone Encounter (Signed)
I prescribed a 6 month supply on June 30th of his crestor; please resolve with pharmacy

## 2016-01-30 ENCOUNTER — Institutional Professional Consult (permissible substitution): Payer: Commercial Managed Care - HMO | Admitting: Cardiovascular Disease

## 2016-02-06 ENCOUNTER — Other Ambulatory Visit: Payer: Self-pay | Admitting: Family Medicine

## 2016-02-06 MED ORDER — ROSUVASTATIN CALCIUM 5 MG PO TABS: 5.0000 mg | ORAL_TABLET | Freq: Every day | ORAL | 5 refills | Status: DC

## 2016-02-06 MED ORDER — ROSUVASTATIN CALCIUM 5 MG PO TABS: 5.0000 mg | ORAL_TABLET | Freq: Every day | ORAL | 1 refills | Status: DC

## 2016-02-19 ENCOUNTER — Encounter: Payer: Self-pay | Admitting: *Deleted

## 2016-04-03 ENCOUNTER — Ambulatory Visit (INDEPENDENT_AMBULATORY_CARE_PROVIDER_SITE_OTHER): Payer: Commercial Managed Care - HMO

## 2016-04-03 DIAGNOSIS — Z23 Encounter for immunization: Secondary | ICD-10-CM

## 2016-05-01 ENCOUNTER — Other Ambulatory Visit: Payer: Self-pay | Admitting: Family Medicine

## 2016-05-01 DIAGNOSIS — I10 Essential (primary) hypertension: Secondary | ICD-10-CM

## 2016-05-01 MED ORDER — ROSUVASTATIN CALCIUM 5 MG PO TABS: 5.0000 mg | ORAL_TABLET | Freq: Every day | ORAL | 1 refills | Status: DC

## 2016-05-01 MED ORDER — ATENOLOL 50 MG PO TABS: 50.0000 mg | ORAL_TABLET | Freq: Every day | ORAL | 1 refills | Status: DC

## 2016-06-28 ENCOUNTER — Ambulatory Visit: Payer: Commercial Managed Care - HMO | Admitting: Family Medicine

## 2016-10-08 ENCOUNTER — Ambulatory Visit
Admission: RE | Admit: 2016-10-08 | Discharge: 2016-10-08 | Disposition: A | Discharge status: Home / Self Care | Payer: Medicare HMO | Source: Ambulatory Visit | Attending: Family Medicine | Admitting: Family Medicine

## 2016-10-08 ENCOUNTER — Encounter: Payer: Self-pay | Admitting: Family Medicine

## 2016-10-08 ENCOUNTER — Ambulatory Visit (INDEPENDENT_AMBULATORY_CARE_PROVIDER_SITE_OTHER): Payer: Medicare HMO | Admitting: Family Medicine

## 2016-10-08 VITALS — BP 120/70 | HR 62 | Temp 97.6°F | Resp 16 | Wt 156.5 lb

## 2016-10-08 DIAGNOSIS — L309 Dermatitis, unspecified: Secondary | ICD-10-CM | POA: Diagnosis not present

## 2016-10-08 DIAGNOSIS — Z955 Presence of coronary angioplasty implant and graft: Secondary | ICD-10-CM | POA: Diagnosis not present

## 2016-10-08 DIAGNOSIS — R05 Cough: Secondary | ICD-10-CM | POA: Diagnosis not present

## 2016-10-08 DIAGNOSIS — E785 Hyperlipidemia, unspecified: Secondary | ICD-10-CM | POA: Diagnosis not present

## 2016-10-08 DIAGNOSIS — J984 Other disorders of lung: Secondary | ICD-10-CM | POA: Insufficient documentation

## 2016-10-08 DIAGNOSIS — I700 Atherosclerosis of aorta: Secondary | ICD-10-CM | POA: Diagnosis not present

## 2016-10-08 DIAGNOSIS — D649 Anemia, unspecified: Secondary | ICD-10-CM | POA: Diagnosis not present

## 2016-10-08 DIAGNOSIS — Z5181 Encounter for therapeutic drug level monitoring: Secondary | ICD-10-CM | POA: Diagnosis not present

## 2016-10-08 DIAGNOSIS — N183 Chronic kidney disease, stage 3 unspecified: Secondary | ICD-10-CM

## 2016-10-08 DIAGNOSIS — R413 Other amnesia: Secondary | ICD-10-CM | POA: Diagnosis not present

## 2016-10-08 DIAGNOSIS — R059 Cough, unspecified: Secondary | ICD-10-CM

## 2016-10-08 HISTORY — DX: Atherosclerosis of aorta: I70.0

## 2016-10-08 LAB — TSH: TSH: 1.25 m[IU]/L (ref 0.40–4.50)

## 2016-10-08 LAB — CBC WITH DIFFERENTIAL/PLATELET
BASOS ABS: 0 {cells}/uL (ref 0–200)
Basophils Relative: 0 %
EOS ABS: 395 {cells}/uL (ref 15–500)
Eosinophils Relative: 5 %
HEMATOCRIT: 45.8 % (ref 38.5–50.0)
HEMOGLOBIN: 14.6 g/dL (ref 13.2–17.1)
LYMPHS ABS: 1422 {cells}/uL (ref 850–3900)
Lymphocytes Relative: 18 %
MCH: 27.4 pg (ref 27.0–33.0)
MCHC: 31.9 g/dL — AB (ref 32.0–36.0)
MCV: 86.1 fL (ref 80.0–100.0)
MONO ABS: 790 {cells}/uL (ref 200–950)
MPV: 9.1 fL (ref 7.5–12.5)
Monocytes Relative: 10 %
NEUTROS PCT: 67 %
Neutro Abs: 5293 cells/uL (ref 1500–7800)
Platelets: 218 10*3/uL (ref 140–400)
RBC: 5.32 MIL/uL (ref 4.20–5.80)
RDW: 16.4 % — ABNORMAL HIGH (ref 11.0–15.0)
WBC: 7.9 10*3/uL (ref 3.8–10.8)

## 2016-10-08 LAB — LIPID PANEL
CHOL/HDL RATIO: 5 ratio — AB (ref <5.0)
CHOLESTEROL: 136 mg/dL (ref <200)
HDL: 27 mg/dL — ABNORMAL LOW (ref >40)
LDL CALC: 58 mg/dL (ref <100)
Triglycerides: 253 mg/dL — ABNORMAL HIGH (ref <150)
VLDL: 51 mg/dL — AB (ref <30)

## 2016-10-08 LAB — COMPLETE METABOLIC PANEL WITH GFR
ALBUMIN: 4.1 g/dL (ref 3.6–5.1)
ALK PHOS: 82 U/L (ref 40–115)
ALT: 8 U/L — ABNORMAL LOW (ref 9–46)
AST: 14 U/L (ref 10–35)
BUN: 19 mg/dL (ref 7–25)
CALCIUM: 9.3 mg/dL (ref 8.6–10.3)
CO2: 23 mmol/L (ref 20–31)
Chloride: 107 mmol/L (ref 98–110)
Creat: 1.58 mg/dL — ABNORMAL HIGH (ref 0.70–1.11)
GFR, Est African American: 45 mL/min — ABNORMAL LOW (ref >=60)
GFR, Est Non African American: 39 mL/min — ABNORMAL LOW (ref >=60)
Glucose, Bld: 109 mg/dL — ABNORMAL HIGH (ref 65–99)
POTASSIUM: 4.4 mmol/L (ref 3.5–5.3)
Sodium: 141 mmol/L (ref 135–146)
Total Bilirubin: 0.3 mg/dL (ref 0.2–1.2)
Total Protein: 7.5 g/dL (ref 6.1–8.1)

## 2016-10-08 LAB — VITAMIN B12: VITAMIN B 12: 307 pg/mL (ref 200–1100)

## 2016-10-08 MED ORDER — SILVER SULFADIAZINE 1 % EX CREA: 1.0000 "application " | TOPICAL_CREAM | Freq: Every day | CUTANEOUS | 0 refills | Status: DC

## 2016-10-08 NOTE — Assessment & Plan Note (Signed)
Not following with cardiologist; patient declines going back; allergic to aspirin

## 2016-10-08 NOTE — Patient Instructions (Signed)
Let's get labs today If you need something for aches or pains, try to use Tylenol (acetaminophen) instead of non-steroidals (which include Aleve, ibuprofen, Advil, Motrin, and naproxen); non-steroidals can cause long-term kidney damage Try to limit saturated fats in your diet (bologna, hot dogs, barbeque, cheeseburgers, hamburgers, steak, bacon, sausage, cheese, etc.) and get more fresh fruits, vegetables, and whole grains We'll have you see the dermatologist and the neurologist If you have not heard anything from my staff in a week about any orders/referrals/studies from today, please contact us here to follow-up [phone] Do NOT drive if you are not 454% able, because you need sharp reflfexes; there are a lot of distracted drivers out on the roads now

## 2016-10-08 NOTE — Assessment & Plan Note (Signed)
Check lipids, limit saturated fats 

## 2016-10-08 NOTE — Progress Notes (Signed)
BP 120/70   Pulse 62   Temp 97.6 F (36.4 C) (Oral)   Resp 16   Wt 156 lb 8 oz (71 kg)   SpO2 97%   BMI 24.51 kg/m    Subjective:    Patient ID: [name] [name], male    DOB: [birth year], 83 y.o.   MRN: [id number]  HPI: [name] [name] is a 83 y.o. male  Chief Complaint  Patient presents with  . Memory Loss    possible dementia  . Rash    right lower leg/ankle possible cellulitis  . 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 He recalled a story about a dog and a rabbit, dog got into the cage, rabbit chased the dog out Not a good eater, gets meals on wheels High cholesterol; on statin Lab Results  Component Value Date   CHOL 153 12/29/2015   CHOL 179 03/13/2015   CHOL 154 07/11/2010   Lab Results  Component Value Date   HDL 33 (L) 12/29/2015   HDL 26 (L) 03/13/2015   HDL 32.60 (L) 07/11/2010   Lab Results  Component Value Date   LDLCALC 70 12/29/2015   [place] 91 03/13/2015   [place] 89 07/11/2010   Lab Results  Component Value Date   TRIG 250 (H) 12/29/2015   TRIG 310 (H) 03/13/2015   TRIG 162.0 (H) 07/11/2010   Lab Results  Component Value Date   CHOLHDL 4.6 12/29/2015   CHOLHDL 6.9 (H) 03/13/2015   CHOLHDL 5 07/11/2010   Lab Results  Component Value Date   LDLDIRECT 85.4 02/08/2009   CKD; not seeing nephrologist, no NSAIDs Very few eggs, not much fatty meat  Depression screen [facility] 2/9 10/08/2016 12/29/2015 09/06/2015 03/10/2015 01/16/2015  Decreased Interest 0 0 0 0 0  Down, Depressed, Hopeless 0 0 0 0 0  PHQ - 2 Score 0 0 0 0 0   6CIT Screen 10/08/2016  What Year? 0  points  What month? 0 points  What time? 0 points  Count back from 20 0 points  Months in reverse 4 points  Repeat phrase 6 points  Total Score 10     Relevant past medical, surgical, family and social history reviewed Past Medical History:  Diagnosis Date  . Arthritis   . CKD (chronic kidney disease) stage 3, GFR 30-59 ml/min 12/29/2015  . Hyperlipidemia   . Hypertension   . Left leg DVT ([place]) 01/2014  . Myocardial infarction   . Pulmonary embolism ([place]) 01/2014   Past Surgical History:  Procedure Laterality Date  . CORONARY STENT PLACEMENT  2006  . JOINT REPLACEMENT  2011 & 2012   both hips  . len replaced Left 2015  . MELANOMA EXCISION     40 years ago   Family History  Problem Relation Age of Onset  . Arthritis Mother   . Arthritis Father   . Depression Father    Social History  Substance Use Topics  . Smoking status: Former Smoker    Types: Pipe  . Smokeless tobacco: Never Used  . Alcohol use No  Interim medical history since last visit reviewed. Allergies and medications reviewed  Review of Systems Per HPI unless specifically indicated above     Objective:    BP 120/70   Pulse 62   Temp 97.6 F (36.4 C) (Oral)   Resp 16   Wt 156 lb 8 oz (71 kg)   SpO2 97%   BMI 24.51 kg/m   Wt Readings from Last 3 Encounters:  10/08/16 156 lb 8 oz (71 kg)  12/29/15 158 lb 8 oz (71.9 kg)  09/06/15 159 lb (72.1 kg)    Physical Exam  Constitutional: He appears well-developed and well-nourished. No distress.  Eyes: No scleral icterus.  Neck: Carotid bruit is not present.  Cardiovascular: Normal rate and regular rhythm.   Pulmonary/Chest: Effort normal.  Coarse rhonchi on the right side, scattered, nonfocal; no wheezes  Neurological: He is alert.  Difficulty with months of the year on 6-CIT, recall of name/address  Skin:  No palmar erythema; nailbeds pale relative to examiner; over the lower RIGHT leg, there is a band of erythematous, denuded skin with some  scabbing laterally; no weeping; band of this area wraps around the lower leg, most affected in the front, less in the back; no foul odor  Psychiatric: He has a normal mood and affect. His mood appears not anxious. His affect is not blunt. His speech is not delayed and not slurred. He is not agitated, not hyperactive, not slowed and not withdrawn. Thought content is not delusional. Cognition and memory are impaired. He does not exhibit a depressed mood. He exhibits abnormal recent memory. He exhibits normal remote memory. He is attentive.   Results for orders placed or performed in visit on 12/29/15  COMPLETE METABOLIC PANEL WITH GFR  Result Value Ref Range   Sodium 139 135 - 146 mmol/L   Potassium 5.3 3.5 - 5.3 mmol/L   Chloride 105 98 - 110 mmol/L   CO2 23 20 - 31 mmol/L   Glucose, Bld 99 65 - 99 mg/dL   BUN 28 (H) 7 - 25 mg/dL   Creat 1.80 (H) 0.70 - 1.11 mg/dL   Total Bilirubin 0.4 0.2 - 1.2 mg/dL   Alkaline Phosphatase 70 40 - 115 U/L   AST 17 10 - 35 U/L   ALT 10 9 - 46 U/L   Total Protein 7.6 6.1 - 8.1 g/dL   Albumin 4.4 3.6 - 5.1 g/dL   Calcium 9.8 8.6 - 10.3 mg/dL   GFR, Est African American 39 (L) >=60 mL/min   GFR, Est Non African American 34 (L) >=60 mL/min  Lipid panel  Result Value Ref Range   Cholesterol 153 125 - 200 mg/dL   Triglycerides 250 (H) <150 mg/dL   HDL 33 (L) >=40 mg/dL   Total CHOL/HDL Ratio 4.6 <=5.0 Ratio   VLDL 50 (H) <30 mg/dL   LDL Cholesterol 70 <130 mg/dL  CBC with Differential/Platelet  Result Value Ref Range   WBC 7.7 3.8 - 10.8 K/uL   RBC 4.59 4.20 - 5.80 MIL/uL   Hemoglobin 12.6 (L) 13.2 - 17.1 g/dL   HCT 39.6 38.5 - 50.0 %   MCV 86.3 80.0 - 100.0 fL   MCH 27.5 27.0 - 33.0 pg   MCHC 31.8 (L) 32.0 - 36.0 g/dL   RDW 15.7 (H) 11.0 - 15.0 %   Platelets 192 140 - 400 K/uL   MPV 9.4 7.5 - 12.5 fL   Neutro Abs 4,697 1,500 - 7,800 cells/uL   Lymphs Abs  1,925 850 - 3,900 cells/uL   Monocytes Absolute 693 200 - 950 cells/uL   Eosinophils  Absolute 385 15 - 500 cells/uL   Basophils Absolute 0 0 - 200 cells/uL   Neutrophils Relative % 61 %   Lymphocytes Relative 25 %   Monocytes Relative 9 %   Eosinophils Relative 5 %   Basophils Relative 0 %   Smear Review Criteria for review not met       Assessment & Plan:   Problem List Items Addressed This Visit      Musculoskeletal and Integument   Dermatitis    Chronic, will treat with silvadene for now and get him to dermatologist for evaluation and treatment, possible biopsy      Relevant Orders   Ambulatory referral to Dermatology     Genitourinary   CKD (chronic kidney disease) stage 3, GFR 30-59 ml/min    Check function; avoid NSAIDs; stay hydrated      Relevant Orders   COMPLETE METABOLIC PANEL WITH GFR     Other   Presence of stent in coronary artery    Not following with cardiologist; patient declines going back; allergic to aspirin      Medication monitoring encounter    Check labs      Relevant Orders   COMPLETE METABOLIC PANEL WITH GFR   Hyperlipidemia LDL goal <70    Check lipids, limit saturated fats      Relevant Orders   Lipid panel   Cough    Check CXR given exposures      Relevant Orders   DG Chest 2 View   Anemia    Check CBC      Relevant Orders   CBC with Differential/Platelet    Other Visit Diagnoses    Memory loss    -  Primary   will check labs (TSH, B12, vit D, CBC) prior to initiating med; refer to neurologist; consider vascular dementia given CAD   Relevant Orders   TSH   VITAMIN D 25 Hydroxy (Vit-D Deficiency, Fractures)   B12   Ambulatory referral to Neurology       Follow up plan: Return in about 6 weeks (around 11/19/2016) for follow-up with Dr. Sanda Klein.  An after-visit summary was printed and given to the patient at [place].  Please see the patient instructions which may contain other information and recommendations beyond what is mentioned above in the assessment and plan.  Meds ordered this encounter    Medications  . silver sulfADIAZINE (SILVADENE) 1 % cream    Sig: Apply 1 application topically daily.    Dispense:  50 g    Refill:  0    Orders Placed This Encounter  Procedures  . DG Chest 2 View  . CBC with Differential/Platelet  . COMPLETE METABOLIC PANEL WITH GFR  . Lipid panel  . TSH  . VITAMIN D 25 Hydroxy (Vit-D Deficiency, Fractures)  . B12  . Ambulatory referral to Dermatology  . Ambulatory referral to Neurology

## 2016-10-08 NOTE — Assessment & Plan Note (Signed)
Chronic, will treat with silvadene for now and get him to dermatologist for evaluation and treatment, possible biopsy

## 2016-10-08 NOTE — Assessment & Plan Note (Signed)
Check CBC 

## 2016-10-08 NOTE — Assessment & Plan Note (Signed)
Check labs 

## 2016-10-08 NOTE — Assessment & Plan Note (Signed)
Check CXR given exposures

## 2016-10-08 NOTE — Assessment & Plan Note (Signed)
Check function; avoid NSAIDs; stay hydrated

## 2016-10-09 LAB — VITAMIN D 25 HYDROXY (VIT D DEFICIENCY, FRACTURES): VIT D 25 HYDROXY: 24 ng/mL — AB (ref 30–100)

## 2016-11-06 DIAGNOSIS — L309 Dermatitis, unspecified: Secondary | ICD-10-CM | POA: Diagnosis not present

## 2016-11-19 ENCOUNTER — Ambulatory Visit (INDEPENDENT_AMBULATORY_CARE_PROVIDER_SITE_OTHER): Payer: Medicare HMO | Admitting: Family Medicine

## 2016-11-19 ENCOUNTER — Encounter: Payer: Self-pay | Admitting: Family Medicine

## 2016-11-19 VITALS — BP 122/74 | HR 68 | Temp 97.5°F | Resp 14 | Wt 155.6 lb

## 2016-11-19 DIAGNOSIS — N183 Chronic kidney disease, stage 3 unspecified: Secondary | ICD-10-CM

## 2016-11-19 DIAGNOSIS — R062 Wheezing: Secondary | ICD-10-CM

## 2016-11-19 DIAGNOSIS — I10 Essential (primary) hypertension: Secondary | ICD-10-CM

## 2016-11-19 DIAGNOSIS — I1310 Hypertensive heart and chronic kidney disease without heart failure, with stage 1 through stage 4 chronic kidney disease, or unspecified chronic kidney disease: Secondary | ICD-10-CM | POA: Diagnosis not present

## 2016-11-19 MED ORDER — ALBUTEROL SULFATE HFA 108 (90 BASE) MCG/ACT IN AERS: 2.0000 | INHALATION_SPRAY | RESPIRATORY_TRACT | 1 refills | Status: DC | PRN

## 2016-11-19 NOTE — Patient Instructions (Addendum)
Request notes from Dr. Adolphus Birchwoodasher (most recent in May 2018) If you need something for aches or pains, try to use Tylenol (acetaminophen) instead of non-steroidals (which include Aleve, ibuprofen, Advil, Motrin, and naproxen); non-steroidals can cause long-term kidney damage Get back on the daily allergy pill Try to use PLAIN allergy medicine without the decongestant Avoid: phenylephrine, phenylpropanolamine, and pseudoephredine Call me if needed and we can prescribe singulair for breathing and allergies We'll have you see the kidney doctor

## 2016-11-19 NOTE — Progress Notes (Signed)
BP 122/74   Pulse 68   Temp 97.5 F (36.4 C) (Oral)   Resp 14   Wt 155 lb 9.6 oz (70.6 kg)   SpO2 95%   BMI 24.37 kg/m    Subjective:    Patient ID: [name] [name], male    DOB: 1927-09-02, 83 y.o.   MRN: [id number]  HPI: [name] [name] is a 83 y.o. male  Chief Complaint  Patient presents with  . 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 The interstitial lung markings are coarse in the right perihilar region and in the left lower lung. These findings are stable. The heart and pulmonary vascularity are normal. The mediastinum is normal in width. There is calcification in the wall of the aortic arch. There is mild anterior wedge compression of T12 which is stable.  IMPRESSION: Chronic scarring in both lungs. No alveolar pneumonia nor pulmonary edema. No significant change in the appearance of the chest since the previous study.  Thoracic aortic atherosclerosis.   Electronically Signed   By: David  Martinique M.D.   On: 10/08/2016 14:28  He has dark color with his leg; he has a rash on the right leg; has been there for years; has seen dermatologist and they gave him creams and it is helping some; not a fungus; Dr. Evorn Gong  Reviewed last labs; he does not see anyone for his  kidneys; creatinine 1.77 03/13/2015; he is worried about tap water; last creatinine down to 1.58; no NSAIDs  Low vitamin D on last labs; taking supplement now; taking 5000 iu daily  Low vitamin B12 on last labs; taking supplement now, not taking as often as the D  Depression screen [facility] 2/9 11/19/2016 10/08/2016 12/29/2015 09/06/2015 03/10/2015  Decreased Interest 0 0 0 0 0  Down, Depressed, Hopeless 0 0 0 0 0  PHQ - 2 Score 0 0 0 0 0    Relevant past medical, surgical, family and social history reviewed Past Medical History:  Diagnosis Date  . Arthritis   . CKD (chronic kidney disease) stage 3, GFR 30-59 ml/min 12/29/2015  . Hyperlipidemia   . Hypertension   . Left leg DVT ([place]) 01/2014  . Myocardial infarction ([place])   . Pulmonary embolism ([place]) 01/2014  . Thoracic aorta atherosclerosis ([place]) 10/08/2016   Past Surgical History:  Procedure Laterality Date  . CORONARY STENT PLACEMENT  2006  . JOINT REPLACEMENT  2011 & 2012   both hips  . len replaced Left 2015  . MELANOMA EXCISION     40 years ago   Family History  Problem Relation Age of Onset  . Arthritis Mother   . Arthritis Father   . Depression Father    Social History   Social History  .  Marital status: Married    Spouse name: N/A  . Number of children: N/A  . Years of education: N/A   Occupational History  . Not on file.   Social History Main Topics  . Smoking status: Former Smoker    Types: Pipe  . Smokeless tobacco: Never Used  . Alcohol use No  . Drug use: No  . Sexual activity: No   Other Topics Concern  . Not on file   Social History Narrative  . No narrative on file    Interim medical history since last visit reviewed. Allergies and medications reviewed  Review of Systems Per HPI unless specifically indicated above     Objective:    BP 122/74   Pulse 68   Temp 97.5 F (36.4 C) (Oral)   Resp 14   Wt 155 lb 9.6 oz (70.6 kg)   SpO2 95%   BMI 24.37 kg/m   Wt Readings from Last 3  Encounters:  11/19/16 155 lb 9.6 oz (70.6 kg)  10/08/16 156 lb 8 oz (71 kg)  12/29/15 158 lb 8 oz (71.9 kg)    Physical Exam  Constitutional: He appears well-developed and well-nourished. No distress.  HENT:  Left Ear: Decreased hearing is noted.  Mouth/Throat: Oropharynx is clear and moist.  Eyes: No scleral icterus.  Neck: No JVD present. Carotid bruit is not present.  Cardiovascular: Normal rate and regular rhythm.   Pulmonary/Chest: Effort normal. No respiratory distress. He has wheezes. He has no rales.  Musculoskeletal: He exhibits no edema.  Neurological: He is alert.  Skin: Rash noted.  nailbeds pale relative to examiner; over the lower RIGHT leg, there is a band of erythematous, very small area of scabbing laterally; no weeping; band of this area wraps around the lower leg  Psychiatric: He has a normal mood and affect. His mood appears not anxious. His affect is not blunt. His speech is not delayed and not slurred. He is not agitated, not hyperactive, not slowed and not withdrawn. He does not exhibit a depressed mood. He is attentive.   Results for orders placed or performed in visit on 10/08/16  CBC with Differential/Platelet  Result Value Ref Range   WBC 7.9 3.8 - 10.8 K/uL   RBC 5.32 4.20 - 5.80 MIL/uL   Hemoglobin 14.6 13.2 - 17.1 g/dL   HCT 45.8 38.5 - 50.0 %   MCV 86.1 80.0 - 100.0 fL   MCH 27.4 27.0 - 33.0 pg   MCHC 31.9 (L) 32.0 - 36.0 g/dL   RDW 16.4 (H) 11.0 - 15.0 %   Platelets 218 140 - 400 K/uL   MPV 9.1 7.5 - 12.5 fL   Neutro Abs 5,293 1,500 - 7,800 cells/uL   Lymphs Abs 1,422 850 - 3,900 cells/uL   Monocytes Absolute 790 200 - 950 cells/uL   Eosinophils Absolute 395 15 - 500 cells/uL   Basophils Absolute 0 0 - 200 cells/uL   Neutrophils Relative % 67 %   Lymphocytes Relative 18 %   Monocytes Relative 10 %   Eosinophils Relative 5 %   Basophils Relative 0 %   Smear Review Criteria for review not met   COMPLETE METABOLIC PANEL WITH GFR  Result Value  Ref Range   Sodium 141 135 - 146 mmol/L   Potassium 4.4 3.5 - 5.3 mmol/L   Chloride 107 98 - 110 mmol/L   CO2 23 20 - 31 mmol/L   Glucose, Bld 109 (H) 65 - 99 mg/dL   BUN  19 7 - 25 mg/dL   Creat 1.58 (H) 0.70 - 1.11 mg/dL   Total Bilirubin 0.3 0.2 - 1.2 mg/dL   Alkaline Phosphatase 82 40 - 115 U/L   AST 14 10 - 35 U/L   ALT 8 (L) 9 - 46 U/L   Total Protein 7.5 6.1 - 8.1 g/dL   Albumin 4.1 3.6 - 5.1 g/dL   Calcium 9.3 8.6 - 10.3 mg/dL   GFR, Est African American 45 (L) >=60 mL/min   GFR, Est Non African American 39 (L) >=60 mL/min  Lipid panel  Result Value Ref Range   Cholesterol 136 <200 mg/dL   Triglycerides 253 (H) <150 mg/dL   HDL 27 (L) >40 mg/dL   Total CHOL/HDL Ratio 5.0 (H) <5.0 Ratio   VLDL 51 (H) <30 mg/dL   LDL Cholesterol 58 <100 mg/dL  TSH  Result Value Ref Range   TSH 1.25 0.40 - 4.50 mIU/L  VITAMIN D 25 Hydroxy (Vit-D Deficiency, Fractures)  Result Value Ref Range   Vit D, 25-Hydroxy 24 (L) 30 - 100 ng/mL  B12  Result Value Ref Range   Vitamin B-12 307 200 - 1,100 pg/mL      Assessment & Plan:   Problem List Items Addressed This Visit      Cardiovascular and Mediastinum   Hypertensive heart/kidney disease without HF and with CKD stage III    Refer to nephrologist; avoid NSAIDs; stay hydrated      Relevant Orders   Ambulatory referral to Nephrology   Hypertension goal BP (blood pressure) < 140/90    Well-controlled today        Genitourinary   CKD (chronic kidney disease) stage 3, GFR 30-59 ml/min    Refer to nephrologist to get established      Relevant Orders   Ambulatory referral to Nephrology    Other Visit Diagnoses    Wheezing    -  Primary   normal spirometry; likely related to allergens; continue loratidine but take daily PRN; offered singulair, will hold for now, call me if needed   Relevant Orders   Spirometry: [place]       Follow up plan: Return in about 4 months (around 03/22/2017).  An after-visit summary was printed and  given to the patient at [place].  Please see the patient instructions which may contain other information and recommendations beyond what is mentioned above in the assessment and plan.  Meds ordered this encounter  Medications  . augmented betamethasone dipropionate (DIPROLENE-AF) 0.05 % cream    Sig: APPLY 1 APPLICATION TOPICALLY TO LEG TWICE DAILY    Refill:  1  . loratadine (CLARITIN) 10 MG tablet    Sig: Take 1 tablet (10 mg total) by mouth daily as needed for allergies.  Marland Kitchen albuterol (PROVENTIL HFA;VENTOLIN HFA) 108 (90 Base) MCG/ACT inhaler    Sig: Inhale 2 puffs into the lungs every 4 (four) hours as needed for wheezing or shortness of breath.    Dispense:  1 Inhaler    Refill:  1    Orders Placed This Encounter  Procedures  . Ambulatory referral to Nephrology  . Spirometry: [place]

## 2016-11-19 NOTE — Assessment & Plan Note (Signed)
Refer to nephrologist to get established

## 2016-11-19 NOTE — Assessment & Plan Note (Signed)
Well controlled today.

## 2016-11-19 NOTE — Assessment & Plan Note (Signed)
Refer to nephrologist; avoid NSAIDs; stay hydrated

## 2016-11-27 DIAGNOSIS — E538 Deficiency of other specified B group vitamins: Secondary | ICD-10-CM | POA: Insufficient documentation

## 2016-11-27 DIAGNOSIS — F39 Unspecified mood [affective] disorder: Secondary | ICD-10-CM | POA: Diagnosis not present

## 2016-11-27 DIAGNOSIS — G3184 Mild cognitive impairment, so stated: Secondary | ICD-10-CM | POA: Diagnosis not present

## 2016-12-09 ENCOUNTER — Other Ambulatory Visit: Payer: Self-pay | Admitting: Family Medicine

## 2016-12-09 DIAGNOSIS — I10 Essential (primary) hypertension: Secondary | ICD-10-CM

## 2016-12-27 DIAGNOSIS — L309 Dermatitis, unspecified: Secondary | ICD-10-CM | POA: Diagnosis not present

## 2017-01-28 DIAGNOSIS — Z961 Presence of intraocular lens: Secondary | ICD-10-CM | POA: Diagnosis not present

## 2017-02-04 DIAGNOSIS — E538 Deficiency of other specified B group vitamins: Secondary | ICD-10-CM | POA: Diagnosis not present

## 2017-02-04 DIAGNOSIS — F39 Unspecified mood [affective] disorder: Secondary | ICD-10-CM | POA: Diagnosis not present

## 2017-02-04 DIAGNOSIS — G3184 Mild cognitive impairment, so stated: Secondary | ICD-10-CM | POA: Diagnosis not present

## 2017-03-05 DIAGNOSIS — M170 Bilateral primary osteoarthritis of knee: Secondary | ICD-10-CM | POA: Diagnosis not present

## 2017-03-24 ENCOUNTER — Encounter: Payer: Self-pay | Admitting: Family Medicine

## 2017-03-24 ENCOUNTER — Ambulatory Visit (INDEPENDENT_AMBULATORY_CARE_PROVIDER_SITE_OTHER): Payer: Medicare HMO

## 2017-03-24 ENCOUNTER — Ambulatory Visit (INDEPENDENT_AMBULATORY_CARE_PROVIDER_SITE_OTHER): Payer: Medicare HMO | Admitting: Family Medicine

## 2017-03-24 ENCOUNTER — Ambulatory Visit: Payer: Medicare HMO | Admitting: Family Medicine

## 2017-03-24 VITALS — BP 122/64 | HR 64 | Temp 97.9°F | Ht 67.0 in | Wt 154.5 lb

## 2017-03-24 DIAGNOSIS — I2510 Atherosclerotic heart disease of native coronary artery without angina pectoris: Secondary | ICD-10-CM

## 2017-03-24 DIAGNOSIS — N183 Chronic kidney disease, stage 3 unspecified: Secondary | ICD-10-CM

## 2017-03-24 DIAGNOSIS — I1310 Hypertensive heart and chronic kidney disease without heart failure, with stage 1 through stage 4 chronic kidney disease, or unspecified chronic kidney disease: Secondary | ICD-10-CM

## 2017-03-24 DIAGNOSIS — Z955 Presence of coronary angioplasty implant and graft: Secondary | ICD-10-CM | POA: Diagnosis not present

## 2017-03-24 DIAGNOSIS — Z0000 Encounter for general adult medical examination without abnormal findings: Secondary | ICD-10-CM

## 2017-03-24 DIAGNOSIS — Z23 Encounter for immunization: Secondary | ICD-10-CM | POA: Diagnosis not present

## 2017-03-24 DIAGNOSIS — D649 Anemia, unspecified: Secondary | ICD-10-CM | POA: Diagnosis not present

## 2017-03-24 DIAGNOSIS — Z5181 Encounter for therapeutic drug level monitoring: Secondary | ICD-10-CM | POA: Diagnosis not present

## 2017-03-24 DIAGNOSIS — E785 Hyperlipidemia, unspecified: Secondary | ICD-10-CM

## 2017-03-24 LAB — CBC WITH DIFFERENTIAL/PLATELET
BASOS ABS: 40 {cells}/uL (ref 0–200)
Basophils Relative: 0.5 %
EOS ABS: 200 {cells}/uL (ref 15–500)
Eosinophils Relative: 2.5 %
HCT: 46.9 % (ref 38.5–50.0)
Hemoglobin: 15.5 g/dL (ref 13.2–17.1)
Lymphs Abs: 1440 cells/uL (ref 850–3900)
MCH: 29.8 pg (ref 27.0–33.0)
MCHC: 33 g/dL (ref 32.0–36.0)
MCV: 90 fL (ref 80.0–100.0)
MONOS PCT: 8.6 %
MPV: 9.3 fL (ref 7.5–12.5)
NEUTROS PCT: 70.4 %
Neutro Abs: 5632 cells/uL (ref 1500–7800)
Platelets: 171 10*3/uL (ref 140–400)
RBC: 5.21 10*6/uL (ref 4.20–5.80)
RDW: 13.7 % (ref 11.0–15.0)
TOTAL LYMPHOCYTE: 18 %
WBC mixed population: 688 cells/uL (ref 200–950)
WBC: 8 10*3/uL (ref 3.8–10.8)

## 2017-03-24 LAB — LIPID PANEL
CHOL/HDL RATIO: 4.2 (calc) (ref <5.0)
CHOLESTEROL: 164 mg/dL (ref <200)
HDL: 39 mg/dL — AB (ref >40)
LDL CHOLESTEROL (CALC): 100 mg/dL — AB
NON-HDL CHOLESTEROL (CALC): 125 mg/dL (ref <130)
TRIGLYCERIDES: 150 mg/dL — AB (ref <150)

## 2017-03-24 LAB — COMPLETE METABOLIC PANEL WITH GFR
AG Ratio: 1.3 (calc) (ref 1.0–2.5)
ALKALINE PHOSPHATASE (APISO): 89 U/L (ref 40–115)
ALT: 11 U/L (ref 9–46)
AST: 12 U/L (ref 10–35)
Albumin: 4.1 g/dL (ref 3.6–5.1)
BUN/Creatinine Ratio: 14 (calc) (ref 6–22)
BUN: 19 mg/dL (ref 7–25)
CALCIUM: 9.6 mg/dL (ref 8.6–10.3)
CO2: 29 mmol/L (ref 20–32)
CREATININE: 1.37 mg/dL — AB (ref 0.70–1.11)
Chloride: 104 mmol/L (ref 98–110)
GFR, EST NON AFRICAN AMERICAN: 46 mL/min/{1.73_m2} — AB (ref >=60)
GFR, Est African American: 54 mL/min/{1.73_m2} — ABNORMAL LOW (ref >=60)
GLOBULIN: 3.1 g/dL (ref 1.9–3.7)
GLUCOSE: 95 mg/dL (ref 65–99)
Potassium: 4.8 mmol/L (ref 3.5–5.3)
SODIUM: 141 mmol/L (ref 135–146)
Total Bilirubin: 0.6 mg/dL (ref 0.2–1.2)
Total Protein: 7.2 g/dL (ref 6.1–8.1)

## 2017-03-24 NOTE — Progress Notes (Signed)
BP 122/64   Pulse 64   Temp 97.9 F (36.6 C) (Oral)   Resp 14   Wt 154 lb 8 oz (70.1 kg)   SpO2 94%   BMI 24.20 kg/m    Subjective:    Patient ID: [name] [name], male    DOB: December 01, 1927, 83 y.o.   MRN: [id number]  HPI: [name] [name] is a 83 y.o. male  Chief Complaint  Patient presents with  . Follow-up    HPI Patient is here for f/u  Allergies are not very bad; using claritin; nose runs when he drinks coffee in the morning;   High cholesterol; on rosuvastatin; no muscle aches; no chest pain; there is room for improvement with his diet, he admits; gets Meals on Wheels; he won't eat cooked spinach, doesn't like that; will eat raw spinach; getting enough veggies; lots of hamburgers, and not sure he can cut back when asked; eats more chicken; not much cheese  Hypertension; well-controlled with beta-blocker  Coronary artery disease; not taking aspirin when I asked; allergic to aspirin; no chest pain; not seeing cardiologist  COPD; has the rescue inhaler, uses just once in a while; maybe 2-3 days a week; activities are not limited by breathing  Flu shot today  Vitamin B12 was low in April; taking supplement  Vitamin D was low in April; taking supplement  CKD, last Cr 1.58; no NSAIDs; not drinking enough water he surmises; big cup of coffee in the morning  Depression screen [facility] 2/9 03/24/2017 11/19/2016 10/08/2016 12/29/2015 09/06/2015  Decreased Interest 0 0 0 0 0  Down, Depressed, Hopeless 0 0 0 0 0  PHQ - 2 Score 0 0 0 0 0    Relevant past medical, surgical, family and social history reviewed Past Medical History:  Diagnosis Date  . Arthritis   . CKD (chronic kidney disease) stage 3, GFR 30-59 ml/min 12/29/2015  . Hyperlipidemia   . Hypertension   . Left leg DVT ([place]) 01/2014  . Myocardial infarction ([place])   . Pulmonary embolism ([place]) 01/2014  . Thoracic aorta atherosclerosis ([place]) 10/08/2016   Past Surgical History:  Procedure Laterality Date  . CORONARY  STENT PLACEMENT  2006  . JOINT REPLACEMENT  2011 & 2012   both hips  . len replaced Left 2015  . MELANOMA EXCISION     40 years ago   Family History  Problem Relation Age of Onset  . Arthritis Mother   . Arthritis Father   . Depression Father    Social History   Social History  . Marital status: Married    Spouse name: N/A  . Number of children: N/A  . Years of education: N/A   Occupational History  . Not on file.   Social History Main Topics  . Smoking status: Former Smoker    Types: Pipe  . Smokeless tobacco: Never Used  . Alcohol use No  . Drug use: No  . Sexual activity: No   Other Topics Concern  . Not on file   Social History Narrative  . No narrative on file    Interim medical history since last visit reviewed. Allergies and medications reviewed  Review of Systems Per HPI unless specifically indicated above     Objective:    BP 122/64   Pulse 64   Temp 97.9 F (36.6 C) (Oral)   Resp 14   Wt 154 lb 8 oz (70.1 kg)   SpO2 94%   BMI 24.20 kg/m  Wt Readings from Last 3 Encounters:  03/24/17 154 lb 8 oz (70.1 kg)  03/24/17 154 lb 8 oz (70.1 kg)  11/19/16 155 lb 9.6 oz (70.6 kg)    Physical Exam  Constitutional: He appears well-developed and well-nourished. No distress.  Weight stable  HENT:  Head: Normocephalic and atraumatic.  Right Ear: Decreased hearing is noted.  Left Ear: Decreased hearing is noted.  Eyes: EOM are normal. No scleral icterus.  Neck: No JVD present. Carotid bruit is not present. No thyromegaly present.  Cardiovascular: Normal rate and regular rhythm.   Pulmonary/Chest: Effort normal and breath sounds normal.  Abdominal: Soft. Bowel sounds are normal. He exhibits no distension.  Musculoskeletal: He exhibits no edema.  Neurological: He is alert.  Skin: Skin is warm and dry. He is not diaphoretic. No pallor. Nails show no clubbing.  Psychiatric: He has a normal mood and affect. His behavior is normal. Judgment and thought  content normal. His mood appears not anxious. He does not exhibit a depressed mood.    Results for orders placed or performed in visit on 10/08/16  CBC with Differential/Platelet  Result Value Ref Range   WBC 7.9 3.8 - 10.8 K/uL   RBC 5.32 4.20 - 5.80 MIL/uL   Hemoglobin 14.6 13.2 - 17.1 g/dL   HCT 45.8 38.5 - 50.0 %   MCV 86.1 80.0 - 100.0 fL   MCH 27.4 27.0 - 33.0 pg   MCHC 31.9 (L) 32.0 - 36.0 g/dL   RDW 16.4 (H) 11.0 - 15.0 %   Platelets 218 140 - 400 K/uL   MPV 9.1 7.5 - 12.5 fL   Neutro Abs 5,293 1,500 - 7,800 cells/uL   Lymphs Abs 1,422 850 - 3,900 cells/uL   Monocytes Absolute 790 200 - 950 cells/uL   Eosinophils Absolute 395 15 - 500 cells/uL   Basophils Absolute 0 0 - 200 cells/uL   Neutrophils Relative % 67 %   Lymphocytes Relative 18 %   Monocytes Relative 10 %   Eosinophils Relative 5 %   Basophils Relative 0 %   Smear Review Criteria for review not met   COMPLETE METABOLIC PANEL WITH GFR  Result Value Ref Range   Sodium 141 135 - 146 mmol/L   Potassium 4.4 3.5 - 5.3 mmol/L   Chloride 107 98 - 110 mmol/L   CO2 23 20 - 31 mmol/L   Glucose, Bld 109 (H) 65 - 99 mg/dL   BUN 19 7 - 25 mg/dL   Creat 1.58 (H) 0.70 - 1.11 mg/dL   Total Bilirubin 0.3 0.2 - 1.2 mg/dL   Alkaline Phosphatase 82 40 - 115 U/L   AST 14 10 - 35 U/L   ALT 8 (L) 9 - 46 U/L   Total Protein 7.5 6.1 - 8.1 g/dL   Albumin 4.1 3.6 - 5.1 g/dL   Calcium 9.3 8.6 - 10.3 mg/dL   GFR, Est African American 45 (L) >=60 mL/min   GFR, Est Non African American 39 (L) >=60 mL/min  Lipid panel  Result Value Ref Range   Cholesterol 136 <200 mg/dL   Triglycerides 253 (H) <150 mg/dL   HDL 27 (L) >40 mg/dL   Total CHOL/HDL Ratio 5.0 (H) <5.0 Ratio   VLDL 51 (H) <30 mg/dL   LDL Cholesterol 58 <100 mg/dL  TSH  Result Value Ref Range   TSH 1.25 0.40 - 4.50 mIU/L  VITAMIN D 25 Hydroxy (Vit-D Deficiency, Fractures)  Result Value Ref Range   Vit D, 25-Hydroxy  24 (L) 30 - 100 ng/mL  B12  Result Value Ref  Range   Vitamin B-12 307 200 - 1,100 pg/mL      Assessment & Plan:   Problem List Items Addressed This Visit      Cardiovascular and Mediastinum   Hypertensive heart/kidney disease without HF and with CKD stage III    Check labs; BP well-controlled      Coronary artery disease involving native coronary artery of native heart without angina pectoris    Allergic to aspirin; no chest pain; continue beta-blocker; BP well-controlled; check lipids; continue statin      Relevant Orders   Ambulatory referral to Cardiology     Other   Presence of stent in coronary artery    Refer back to carediologist      Medication monitoring encounter    Check liver and kidneys      Relevant Orders   COMPLETE METABOLIC PANEL WITH GFR   Hyperlipidemia LDL goal <70    Check lipids today (fasting); continue statin; limit saturated fats      Relevant Orders   Lipid panel   Anemia    Check CBC      Relevant Orders   CBC with Differential/Platelet       Follow up plan: Return in about 6 months (around 09/21/2017) for twenty minute follow-up with fasting labs.  An after-visit summary was printed and given to the patient at [place].  Please see the patient instructions which may contain other information and recommendations beyond what is mentioned above in the assessment and plan.  No orders of the defined types were placed in this encounter.   Orders Placed This Encounter  Procedures  . CBC with Differential/Platelet  . COMPLETE METABOLIC PANEL WITH GFR  . Lipid panel  . Ambulatory referral to Cardiology

## 2017-03-24 NOTE — Assessment & Plan Note (Signed)
Check lipids today (fasting); continue statin; limit saturated fats

## 2017-03-24 NOTE — Patient Instructions (Signed)
[name] [name] , Thank you for taking time to come for your Medicare Wellness Visit. I appreciate your ongoing commitment to your health goals. Please review the following plan we discussed and let me know if I can assist you in the future.   Screening recommendations/referrals: Colonoscopy: mo longer required Recommended yearly ophthalmology/optometry visit for glaucoma screening and checkup Recommended yearly dental visit for hygiene and checkup  Vaccinations: Influenza vaccine: completed today Pneumococcal vaccine: completed series Tdap vaccine: up to date Shingles vaccine: completed 07/01/12  Advanced directives: Please bring a copy of your POA (Power of [place]) and/or Living Will to your next appointment.   Conditions/risks identified: Recommend increasing water intake to 3 glasses a day.   Next appointment: 10:20 AM today with PCP  Preventive Care 81 Years and Older, Male Preventive care refers to lifestyle choices and visits with your health care provider that can promote health and wellness. What does preventive care include?  A yearly physical exam. This is also called an annual well check.  Dental exams once or twice a year.  Routine eye exams. Ask your health care provider how often you should have your eyes checked.  Personal lifestyle choices, including:  Daily care of your teeth and gums.  Regular physical activity.  Eating a healthy diet.  Avoiding tobacco and drug use.  Limiting alcohol use.  Practicing safe sex.  Taking low doses of aspirin every day.  Taking vitamin and mineral supplements as recommended by your health care provider. What happens during an annual well check? The services and screenings done by your health care provider during your annual well check will depend on your age, overall health, lifestyle risk factors, and family history of disease. Counseling  Your health care provider may ask you questions about your:  Alcohol  use.  Tobacco use.  Drug use.  Emotional well-being.  Home and relationship well-being.  Sexual activity.  Eating habits.  History of falls.  Memory and ability to understand (cognition).  Work and work Astronomer. Screening  You may have the following tests or measurements:  Height, weight, and BMI.  Blood pressure.  Lipid and cholesterol levels. These may be checked every 5 years, or more frequently if you are over 81 years old.  Skin check.  Lung cancer screening. You may have this screening every year starting at age 81 if you have a 30-pack-year history of smoking and currently smoke or have quit within the past 15 years.  Fecal occult blood test (FOBT) of the stool. You may have this test every year starting at age 81.  Flexible sigmoidoscopy or colonoscopy. You may have a sigmoidoscopy every 5 years or a colonoscopy every 10 years starting at age 81.  Prostate cancer screening. Recommendations will vary depending on your family history and other risks.  Hepatitis C blood test.  Hepatitis B blood test.  Sexually transmitted disease (STD) testing.  Diabetes screening. This is done by checking your blood sugar (glucose) after you have not eaten for a while (fasting). You may have this done every 1-3 years.  Abdominal aortic aneurysm (AAA) screening. You may need this if you are a current or former smoker.  Osteoporosis. You may be screened starting at age 26 if you are at high risk. Talk with your health care provider about your test results, treatment options, and if necessary, the need for more tests. Vaccines  Your health care provider may recommend certain vaccines, such as:  Influenza vaccine. This is recommended every year.  Tetanus, diphtheria, and acellular pertussis (Tdap, Td) vaccine. You may need a Td booster every 10 years.  Zoster vaccine. You may need this after age 81.  Pneumococcal 13-valent conjugate (PCV13) vaccine. One dose is  recommended after age 81.  Pneumococcal polysaccharide (PPSV23) vaccine. One dose is recommended after age 81. Talk to your health care provider about which screenings and vaccines you need and how often you need them. This information is not intended to replace advice given to you by your health care provider. Make sure you discuss any questions you have with your health care provider. Document Released: 07/14/2015 Document Revised: 03/06/2016 Document Reviewed: 04/18/2015 Elsevier Interactive Patient Education  2017 [place] Prevention in the Home Falls can cause injuries. They can happen to people of all ages. There are many things you can do to make your home safe and to help prevent falls. What can I do on the outside of my home?  Regularly fix the edges of walkways and driveways and fix any cracks.  Remove anything that might make you trip as you walk through a door, such as a raised step or threshold.  Trim any bushes or trees on the path to your home.  Use bright outdoor lighting.  Clear any walking paths of anything that might make someone trip, such as rocks or tools.  Regularly check to see if handrails are loose or broken. Make sure that both sides of any steps have handrails.  Any raised decks and porches should have guardrails on the edges.  Have any leaves, snow, or ice cleared regularly.  Use sand or salt on walking paths during winter.  Clean up any spills in your garage right away. This includes oil or grease spills. What can I do in the bathroom?  Use night lights.  Install grab bars by the toilet and in the tub and shower. Do not use towel bars as grab bars.  Use non-skid mats or decals in the tub or shower.  If you need to sit down in the shower, use a plastic, non-slip stool.  Keep the floor dry. Clean up any water that spills on the floor as soon as it happens.  Remove soap buildup in the tub or shower regularly.  Attach bath mats  securely with double-sided non-slip rug tape.  Do not have throw rugs and other things on the floor that can make you trip. What can I do in the bedroom?  Use night lights.  Make sure that you have a light by your bed that is easy to reach.  Do not use any sheets or blankets that are too big for your bed. They should not hang down onto the floor.  Have a firm chair that has side arms. You can use this for support while you get dressed.  Do not have throw rugs and other things on the floor that can make you trip. What can I do in the kitchen?  Clean up any spills right away.  Avoid walking on wet floors.  Keep items that you use a lot in easy-to-reach places.  If you need to reach something above you, use a strong step stool that has a grab bar.  Keep electrical cords out of the way.  Do not use floor polish or wax that makes floors slippery. If you must use wax, use non-skid floor wax.  Do not have throw rugs and other things on the floor that can make you trip. What can I do  with my stairs?  Do not leave any items on the stairs.  Make sure that there are handrails on both sides of the stairs and use them. Fix handrails that are broken or loose. Make sure that handrails are as long as the stairways.  Check any carpeting to make sure that it is firmly attached to the stairs. Fix any carpet that is loose or worn.  Avoid having throw rugs at the top or bottom of the stairs. If you do have throw rugs, attach them to the floor with carpet tape.  Make sure that you have a light switch at the top of the stairs and the bottom of the stairs. If you do not have them, ask someone to add them for you. What else can I do to help prevent falls?  Wear shoes that:  Do not have high heels.  Have rubber bottoms.  Are comfortable and fit you well.  Are closed at the toe. Do not wear sandals.  If you use a stepladder:  Make sure that it is fully opened. Do not climb a closed  stepladder.  Make sure that both sides of the stepladder are locked into place.  Ask someone to hold it for you, if possible.  Clearly mark and make sure that you can see:  Any grab bars or handrails.  First and last steps.  Where the edge of each step is.  Use tools that help you move around (mobility aids) if they are needed. These include:  Canes.  Walkers.  Scooters.  Crutches.  Turn on the lights when you go into a dark area. Replace any light bulbs as soon as they burn out.  Set up your furniture so you have a clear path. Avoid moving your furniture around.  If any of your floors are uneven, fix them.  If there are any pets around you, be aware of where they are.  Review your medicines with your doctor. Some medicines can make you feel dizzy. This can increase your chance of falling. Ask your doctor what other things that you can do to help prevent falls. This information is not intended to replace advice given to you by your health care provider. Make sure you discuss any questions you have with your health care provider. Document Released: 04/13/2009 Document Revised: 11/23/2015 Document Reviewed: 07/22/2014 Elsevier Interactive Patient Education  2017 [employer].

## 2017-03-24 NOTE — Assessment & Plan Note (Signed)
Check CBC 

## 2017-03-24 NOTE — Progress Notes (Signed)
Subjective:   [name] [name] is a 83 y.o. male who presents for Medicare Annual/Subsequent preventive examination.  Review of Systems:  N/A  Cardiac Risk Factors include: advanced age (>77men, >60 women);dyslipidemia;male gender;hypertension     Objective:    Vitals: BP 122/64 (BP Location: Left Arm)   Pulse 64   Temp 97.9 F (36.6 C) (Oral)   Ht  (1.702 m)   Wt 154 lb 8 oz (70.1 kg)   BMI 24.20 kg/m   Body mass index is 24.2 kg/m.  Tobacco History  Smoking Status  . Former Smoker  . Types: Pipe  Smokeless Tobacco  . Never Used     Counseling given: Not Answered   Past Medical History:  Diagnosis Date  . Arthritis   . CKD (chronic kidney disease) stage 3, GFR 30-59 ml/min 12/29/2015  . Hyperlipidemia   . Hypertension   . Left leg DVT ([facility]) 01/2014  . Myocardial infarction ([facility])   . Pulmonary embolism ([facility]) 01/2014  . Thoracic aorta atherosclerosis ([facility]) 10/08/2016   Past Surgical History:  Procedure Laterality Date  . CORONARY STENT PLACEMENT  2006  . JOINT REPLACEMENT  2011 & 2012   both hips  . len replaced Left 2015  . MELANOMA EXCISION     40 years ago   Family History  Problem Relation Age of Onset  . Arthritis Mother   . Arthritis Father   . Depression Father    History  Sexual Activity  . Sexual activity: No    Outpatient Encounter Prescriptions as of 03/24/2017  Medication Sig  . albuterol (PROVENTIL HFA;VENTOLIN HFA) 108 (90 Base) MCG/ACT inhaler Inhale 2 puffs into the lungs every 4 (four) hours as needed for wheezing or shortness of breath.  Marland Kitchen atenolol (TENORMIN) 50 MG tablet TAKE 1 TABLET (50 MG TOTAL) BY MOUTH DAILY.  Marland Kitchen augmented betamethasone dipropionate (DIPROLENE-AF) 0.05 % cream APPLY 1 APPLICATION TOPICALLY TO LEG TWICE DAILY (as needed)  . loratadine (CLARITIN) 10 MG tablet Take 1 tablet (10 mg total) by mouth daily as needed for allergies.  . rosuvastatin (CRESTOR) 5 MG tablet TAKE 1 TABLET (5 MG TOTAL) BY MOUTH AT  BEDTIME.   No facility-administered encounter medications on file as of 03/24/2017.     Activities of Daily Living In your present state of health, do you have any difficulty performing the following activities: 03/24/2017 11/19/2016  Hearing? Malvin Johns  Comment wears a hearing aid in left ear (lost hearing aid for right ear) -  Vision? N Y  Difficulty concentrating or making decisions? Y N  Comment trouble remembering long term -  Walking or climbing stairs? N Y  Comment always uses elevator -  Dressing or bathing? N N  Doing errands, shopping? N N  Preparing Food and eating ? N -  Using the Toilet? N -  In the past six months, have you accidently leaked urine? N -  Do you have problems with loss of bowel control? N -  Managing your Medications? N -  Managing your Finances? Y -  Comment daughter takes care of financing -  Housekeeping or managing your Housekeeping? N -  Some recent data might be hidden    Patient Care Team: Lada, Janit Bern, MD as PCP - General (Family Medicine)   Assessment:     Exercise Activities and Dietary recommendations Current Exercise Habits: The patient does not participate in regular exercise at present, Exercise limited by: orthopedic condition(s)  Goals    .  Increase water intake          Recommend increasing water intake to 3 glasses a day.       Fall Risk Fall Risk  03/24/2017 11/19/2016 10/08/2016 12/29/2015 09/06/2015  Falls in the past year? No No No No No  Number falls in past yr: - - - - -  Injury with Fall? - - - - -  Risk Factor Category  - - - - -  Follow up - - - - -   Depression Screen PHQ 2/9 Scores 03/24/2017 11/19/2016 10/08/2016 12/29/2015  PHQ - 2 Score 0 0 0 0    Cognitive Function- Pt declined screening today.     6CIT Screen 10/08/2016  What Year? 0 points  What month? 0 points  What time? 0 points  Count back from 20 0 points  Months in reverse 4 points  Repeat phrase 6 points  Total Score 10    Immunization History    Administered Date(s) Administered  . Influenza, High Dose Seasonal PF 03/10/2015, 04/03/2016, 03/24/2017  . Pneumococcal Polysaccharide-23 07/02/2007, 07/12/2013  . Tdap 02/24/2012  . Zoster 07/01/2012   Screening Tests Health Maintenance  Topic Date Due  . TETANUS/TDAP  02/23/2022  . INFLUENZA VACCINE  Completed  . PNA vac Low Risk Adult  Completed      Plan:  I have personally reviewed and addressed the Medicare Annual Wellness questionnaire and have noted the following in the patient's chart:  A. Medical and social history B. Use of alcohol, tobacco or illicit drugs  C. Current medications and supplements D. Functional ability and status E.  Nutritional status F.  Physical activity G. Advance directives H. List of other physicians I.  Hospitalizations, surgeries, and ER visits in previous 12 months J.  Vitals K. Screenings such as hearing and vision if needed, cognitive and depression L. Referrals and appointments - none  In addition, I have reviewed and discussed with patient certain preventive protocols, quality metrics, and best practice recommendations. A written personalized care plan for preventive services as well as general preventive health recommendations were provided to patient.  See attached scanned questionnaire for additional information.   Signed,  Hyacinth Meeker, LPN Nurse Health Advisor   MD Recommendations: None. ---------------------------------------- MD recommendation: please confirm patient's code status and the presence of any advanced directives, health care power of attorney, MOST form, etc and document in the chart; thank you  I have reviewed this encounter including the documentation in this note and/or discussed this patient with the provider, Hyacinth Meeker, LPN.  Baruch Gouty, MD [facility] 03/25/2017, 5:05 PM

## 2017-03-24 NOTE — Assessment & Plan Note (Signed)
Refer back to carediologist

## 2017-03-24 NOTE — Patient Instructions (Addendum)
I'll recommend that you take just 5,000 iu of vitamin D TWO DAYS A WEEK, not every single day I'll recommend vitamin B12 250 or 500 mcg daily Try to drink more water, enough to keep your urine pale yellow If you need something for aches or pains, try to use Tylenol (acetaminophen) instead of non-steroidals (which include Aleve, ibuprofen, Advil, Motrin, and naproxen); non-steroidals can cause long-term kidney damage We'll get labs today and contact you We'll have you see the cardiologist to see if there are any new blockages If you have not heard anything from my staff in a week about any orders/referrals/studies from today, please contact us here to follow-up [phone]

## 2017-03-24 NOTE — Assessment & Plan Note (Signed)
Check labs; BP well-controlled

## 2017-03-24 NOTE — Assessment & Plan Note (Signed)
Allergic to aspirin; no chest pain; continue beta-blocker; BP well-controlled; check lipids; continue statin

## 2017-03-24 NOTE — Assessment & Plan Note (Signed)
Check liver and kidneys 

## 2017-03-26 ENCOUNTER — Other Ambulatory Visit: Payer: Self-pay | Admitting: Family Medicine

## 2017-03-26 MED ORDER — ROSUVASTATIN CALCIUM 10 MG PO TABS
10.0000 mg | ORAL_TABLET | Freq: Every day | ORAL | 3 refills | Status: DC
Start: 2017-03-26 — End: 2017-06-01

## 2017-03-26 NOTE — Progress Notes (Signed)
Increase statin

## 2017-04-28 DIAGNOSIS — L309 Dermatitis, unspecified: Secondary | ICD-10-CM | POA: Diagnosis not present

## 2017-05-09 DIAGNOSIS — M179 Osteoarthritis of knee, unspecified: Secondary | ICD-10-CM | POA: Insufficient documentation

## 2017-05-09 DIAGNOSIS — M1710 Unilateral primary osteoarthritis, unspecified knee: Secondary | ICD-10-CM | POA: Insufficient documentation

## 2017-05-09 DIAGNOSIS — M170 Bilateral primary osteoarthritis of knee: Secondary | ICD-10-CM | POA: Diagnosis not present

## 2017-05-23 ENCOUNTER — Emergency Department: Payer: Medicare HMO

## 2017-05-23 ENCOUNTER — Other Ambulatory Visit: Payer: Self-pay

## 2017-05-23 ENCOUNTER — Inpatient Hospital Stay
Admission: EM | Admit: 2017-05-23 | Discharge: 2017-05-28 | DRG: 272 | Disposition: A | Discharge status: Skilled Nursing Facility | Payer: Medicare HMO | Attending: Internal Medicine | Admitting: Internal Medicine

## 2017-05-23 ENCOUNTER — Encounter: Payer: Self-pay | Admitting: Emergency Medicine

## 2017-05-23 DIAGNOSIS — I96 Gangrene, not elsewhere classified: Secondary | ICD-10-CM | POA: Diagnosis not present

## 2017-05-23 DIAGNOSIS — Z955 Presence of coronary angioplasty implant and graft: Secondary | ICD-10-CM | POA: Diagnosis not present

## 2017-05-23 DIAGNOSIS — I7090 Unspecified atherosclerosis: Secondary | ICD-10-CM | POA: Diagnosis not present

## 2017-05-23 DIAGNOSIS — Z96643 Presence of artificial hip joint, bilateral: Secondary | ICD-10-CM | POA: Diagnosis present

## 2017-05-23 DIAGNOSIS — L03032 Cellulitis of left toe: Secondary | ICD-10-CM | POA: Diagnosis present

## 2017-05-23 DIAGNOSIS — Z95820 Peripheral vascular angioplasty status with implants and grafts: Secondary | ICD-10-CM | POA: Diagnosis not present

## 2017-05-23 DIAGNOSIS — Z85820 Personal history of malignant melanoma of skin: Secondary | ICD-10-CM

## 2017-05-23 DIAGNOSIS — I129 Hypertensive chronic kidney disease with stage 1 through stage 4 chronic kidney disease, or unspecified chronic kidney disease: Secondary | ICD-10-CM | POA: Diagnosis present

## 2017-05-23 DIAGNOSIS — Z86718 Personal history of other venous thrombosis and embolism: Secondary | ICD-10-CM

## 2017-05-23 DIAGNOSIS — M79672 Pain in left foot: Secondary | ICD-10-CM | POA: Diagnosis present

## 2017-05-23 DIAGNOSIS — S8992XA Unspecified injury of left lower leg, initial encounter: Secondary | ICD-10-CM | POA: Diagnosis not present

## 2017-05-23 DIAGNOSIS — E785 Hyperlipidemia, unspecified: Secondary | ICD-10-CM | POA: Diagnosis not present

## 2017-05-23 DIAGNOSIS — I252 Old myocardial infarction: Secondary | ICD-10-CM | POA: Diagnosis not present

## 2017-05-23 DIAGNOSIS — N183 Chronic kidney disease, stage 3 (moderate): Secondary | ICD-10-CM | POA: Diagnosis present

## 2017-05-23 DIAGNOSIS — M6281 Muscle weakness (generalized): Secondary | ICD-10-CM | POA: Diagnosis not present

## 2017-05-23 DIAGNOSIS — E7800 Pure hypercholesterolemia, unspecified: Secondary | ICD-10-CM | POA: Diagnosis present

## 2017-05-23 DIAGNOSIS — Z23 Encounter for immunization: Secondary | ICD-10-CM

## 2017-05-23 DIAGNOSIS — I70209 Unspecified atherosclerosis of native arteries of extremities, unspecified extremity: Secondary | ICD-10-CM | POA: Diagnosis not present

## 2017-05-23 DIAGNOSIS — I743 Embolism and thrombosis of arteries of the lower extremities: Secondary | ICD-10-CM | POA: Diagnosis not present

## 2017-05-23 DIAGNOSIS — F0390 Unspecified dementia without behavioral disturbance: Secondary | ICD-10-CM | POA: Diagnosis not present

## 2017-05-23 DIAGNOSIS — Z86711 Personal history of pulmonary embolism: Secondary | ICD-10-CM

## 2017-05-23 DIAGNOSIS — Z87891 Personal history of nicotine dependence: Secondary | ICD-10-CM | POA: Diagnosis not present

## 2017-05-23 DIAGNOSIS — I70262 Atherosclerosis of native arteries of extremities with gangrene, left leg: Secondary | ICD-10-CM | POA: Diagnosis not present

## 2017-05-23 DIAGNOSIS — S3993XA Unspecified injury of pelvis, initial encounter: Secondary | ICD-10-CM | POA: Diagnosis not present

## 2017-05-23 DIAGNOSIS — S79922A Unspecified injury of left thigh, initial encounter: Secondary | ICD-10-CM | POA: Diagnosis not present

## 2017-05-23 DIAGNOSIS — K5730 Diverticulosis of large intestine without perforation or abscess without bleeding: Secondary | ICD-10-CM | POA: Diagnosis not present

## 2017-05-23 DIAGNOSIS — I1310 Hypertensive heart and chronic kidney disease without heart failure, with stage 1 through stage 4 chronic kidney disease, or unspecified chronic kidney disease: Secondary | ICD-10-CM | POA: Diagnosis not present

## 2017-05-23 DIAGNOSIS — I739 Peripheral vascular disease, unspecified: Secondary | ICD-10-CM | POA: Diagnosis not present

## 2017-05-23 DIAGNOSIS — R296 Repeated falls: Secondary | ICD-10-CM | POA: Diagnosis not present

## 2017-05-23 DIAGNOSIS — M79605 Pain in left leg: Secondary | ICD-10-CM | POA: Diagnosis not present

## 2017-05-23 DIAGNOSIS — M79662 Pain in left lower leg: Secondary | ICD-10-CM | POA: Diagnosis not present

## 2017-05-23 DIAGNOSIS — R262 Difficulty in walking, not elsewhere classified: Secondary | ICD-10-CM | POA: Diagnosis not present

## 2017-05-23 DIAGNOSIS — I82432 Acute embolism and thrombosis of left popliteal vein: Secondary | ICD-10-CM | POA: Diagnosis not present

## 2017-05-23 DIAGNOSIS — G9009 Other idiopathic peripheral autonomic neuropathy: Secondary | ICD-10-CM | POA: Diagnosis not present

## 2017-05-23 DIAGNOSIS — Z7401 Bed confinement status: Secondary | ICD-10-CM | POA: Diagnosis not present

## 2017-05-23 DIAGNOSIS — I10 Essential (primary) hypertension: Secondary | ICD-10-CM | POA: Diagnosis not present

## 2017-05-23 LAB — COMPREHENSIVE METABOLIC PANEL
ALT: 43 U/L (ref 17–63)
ANION GAP: 11 (ref 5–15)
AST: 48 U/L — ABNORMAL HIGH (ref 15–41)
Albumin: 3.5 g/dL (ref 3.5–5.0)
Alkaline Phosphatase: 86 U/L (ref 38–126)
BUN: 23 mg/dL — ABNORMAL HIGH (ref 6–20)
CHLORIDE: 103 mmol/L (ref 101–111)
CO2: 24 mmol/L (ref 22–32)
Calcium: 9.2 mg/dL (ref 8.9–10.3)
Creatinine, Ser: 1.15 mg/dL (ref 0.61–1.24)
GFR calc non Af Amer: 56 mL/min — ABNORMAL LOW (ref >60)
Glucose, Bld: 112 mg/dL — ABNORMAL HIGH (ref 65–99)
Potassium: 3.8 mmol/L (ref 3.5–5.1)
SODIUM: 138 mmol/L (ref 135–145)
Total Bilirubin: 0.9 mg/dL (ref 0.3–1.2)
Total Protein: 7.7 g/dL (ref 6.5–8.1)

## 2017-05-23 LAB — CBC
HEMATOCRIT: 42.8 % (ref 40.0–52.0)
Hemoglobin: 14.1 g/dL (ref 13.0–18.0)
MCH: 30.5 pg (ref 26.0–34.0)
MCHC: 33 g/dL (ref 32.0–36.0)
MCV: 92.4 fL (ref 80.0–100.0)
Platelets: 193 10*3/uL (ref 150–440)
RBC: 4.63 MIL/uL (ref 4.40–5.90)
RDW: 14.4 % (ref 11.5–14.5)
WBC: 14 10*3/uL — ABNORMAL HIGH (ref 3.8–10.6)

## 2017-05-23 LAB — PROTIME-INR
INR: 1.07
PROTHROMBIN TIME: 13.8 s (ref 11.4–15.2)

## 2017-05-23 LAB — APTT: APTT: 29 s (ref 24–36)

## 2017-05-23 MED ORDER — MORPHINE SULFATE (PF) 2 MG/ML IV SOLN
2.0000 mg | INTRAVENOUS | Status: DC | PRN
  Administered 2017-05-25 – 2017-05-26 (×3): 2 mg via INTRAVENOUS
  Filled 2017-05-23 (×3): qty 1

## 2017-05-23 MED ORDER — IOPAMIDOL (ISOVUE-370) INJECTION 76%
125.0000 mL | Freq: Once | INTRAVENOUS | Status: AC | PRN
  Administered 2017-05-23: 125 mL via INTRAVENOUS
  Filled 2017-05-23: qty 125

## 2017-05-23 MED ORDER — HEPARIN BOLUS VIA INFUSION
5000.0000 [IU] | Freq: Once | INTRAVENOUS | Status: AC
  Administered 2017-05-23: 5000 [IU] via INTRAVENOUS
  Filled 2017-05-23: qty 5000

## 2017-05-23 MED ORDER — SODIUM CHLORIDE 0.9 % IV BOLUS (SEPSIS)
1000.0000 mL | Freq: Once | INTRAVENOUS | Status: AC
  Administered 2017-05-23: 1000 mL via INTRAVENOUS

## 2017-05-23 MED ORDER — PNEUMOCOCCAL VAC POLYVALENT 25 MCG/0.5ML IJ INJ
0.5000 mL | INJECTION | INTRAMUSCULAR | Status: AC
  Administered 2017-05-25: 0.5 mL via INTRAMUSCULAR
  Filled 2017-05-23: qty 0.5

## 2017-05-23 MED ORDER — PIPERACILLIN-TAZOBACTAM 3.375 G IVPB
3.3750 g | Freq: Three times a day (TID) | INTRAVENOUS | Status: DC
  Administered 2017-05-24 (×2): 3.375 g via INTRAVENOUS
  Filled 2017-05-23 (×2): qty 50

## 2017-05-23 MED ORDER — OXYCODONE-ACETAMINOPHEN 5-325 MG PO TABS
1.0000 | ORAL_TABLET | ORAL | Status: DC | PRN
  Administered 2017-05-24 – 2017-05-25 (×2): 1 via ORAL
  Filled 2017-05-23 (×2): qty 1

## 2017-05-23 MED ORDER — HEPARIN (PORCINE) IN NACL 100-0.45 UNIT/ML-% IJ SOLN
1100.0000 [IU]/h | INTRAMUSCULAR | Status: DC
  Administered 2017-05-23: 1100 [IU]/h via INTRAVENOUS
  Filled 2017-05-23: qty 250

## 2017-05-23 NOTE — H&P (Signed)
[facility] - [place] at [facility]   PATIENT NAME: [name] [name]    MR#:  [id number]  DATE OF BIRTH:  1928/04/06  DATE OF ADMISSION:  05/23/2017  PRIMARY CARE PHYSICIAN: Kerman Passey, MD   REQUESTING/REFERRING PHYSICIAN:   CHIEF COMPLAINT:   Chief Complaint  Patient presents with  . Fall    HISTORY OF PRESENT ILLNESS: [name] [name]  is a 83 y.o. male with a known history of chronic kidney disease stage III, hyperlipidemia, hypertension, left leg DVT, myocardial infarction, pulmonary embolism, thoracic aortic atherosclerosis, osteoarthritis presented to the emergency room with left lower extremity pain. Patient lost balance and slide down in the bathtub from standing position today. No history of any head injury or losing consciousness. Patient also complains of pain in the left foot and this pain going on for the last 1 week. No history of any palpitations. No complaints of any chest pain, shortness of breath. He has some blackish discoloration of the little toe and the left foot. Has pain in the dorsum of the left foot as well as near the left heel area. Patient was evaluated with CT angiogram of abdominal aorta which shows occlusion of the left superficial femoral artery, extending up to popliteal artery and distal right peroneal artery. Case was discussed by ER physician with vascular surgeon who recommended arteriogram and possible stenting. Advised patient to be started on IV heparin drip for anticoagulation and antibiotics.   PAST MEDICAL HISTORY:   Past Medical History:  Diagnosis Date  . Arthritis   . CKD (chronic kidney disease) stage 3, GFR 30-59 ml/min (HCC) 12/29/2015  . Hyperlipidemia   . Hypertension   . Left leg DVT (HCC) 01/2014  . Myocardial infarction (HCC)   . Pulmonary embolism (HCC) 01/2014  . Thoracic aorta atherosclerosis (HCC) 10/08/2016    PAST SURGICAL HISTORY:  Past Surgical History:  Procedure Laterality Date  . CORONARY  STENT PLACEMENT  2006  . JOINT REPLACEMENT  2011 & 2012   both hips  . len replaced Left 2015  . MELANOMA EXCISION     40 years ago    SOCIAL HISTORY:  Social History   Tobacco Use  . Smoking status: Former Smoker    Types: Pipe  . Smokeless tobacco: Never Used  Substance Use Topics  . Alcohol use: No    Alcohol/week: 0.0 oz    FAMILY HISTORY:  Family History  Problem Relation Age of Onset  . Arthritis Mother   . Arthritis Father   . Depression Father     DRUG ALLERGIES:  Allergies  Allergen Reactions  . Aspirin Anaphylaxis, Swelling and Shortness Of Breath    REACTION: terrible bp reaction/wheezing  . Bee Pollen Other (See Comments)    ragweed  . Pollen Extract     ragweed  . Rabbit Epithelium     rabbits    REVIEW OF SYSTEMS:   CONSTITUTIONAL: No fever, fatigue or weakness.  EYES: No blurred or double vision.  EARS, NOSE, AND THROAT: No tinnitus or ear pain.  RESPIRATORY: No cough, shortness of breath, wheezing or hemoptysis.  CARDIOVASCULAR: No chest pain, orthopnea, edema.  GASTROINTESTINAL: No nausea, vomiting, diarrhea or abdominal pain.  GENITOURINARY: No dysuria, hematuria.  ENDOCRINE: No polyuria, nocturia,  HEMATOLOGY: No anemia, easy bruising or bleeding SKIN: Blackish discoloration of skin little toe left foot MUSCULOSKELETAL: left foot pain Pain little toe left toe NEUROLOGIC: No tingling, numbness, weakness.  PSYCHIATRY: No anxiety or depression.   MEDICATIONS AT  HOME:  Prior to Admission medications   Medication Sig Start Date End Date Taking? Authorizing Provider  albuterol (PROVENTIL HFA;VENTOLIN HFA) 108 (90 Base) MCG/ACT inhaler Inhale 2 puffs into the lungs every 4 (four) hours as needed for wheezing or shortness of breath. 11/19/16  Yes Lada, Janit BernMelinda P, MD  atenolol (TENORMIN) 50 MG tablet TAKE 1 TABLET (50 MG TOTAL) BY MOUTH DAILY. 12/09/16  Yes Lada, Janit BernMelinda P, MD  Cholecalciferol (VITAMIN D-1000 MAX ST) 1000 units tablet Take 5  tablets by mouth daily.   Yes [provider]  donepezil (ARICEPT) 5 MG tablet Take 1 tablet by mouth every evening. 04/27/17  Yes [provider]  loratadine (CLARITIN) 10 MG tablet Take 1 tablet (10 mg total) by mouth daily as needed for allergies. 11/19/16  Yes Lada, Janit BernMelinda P, MD  Omega-3 Fatty Acids (FISH OIL PO) Take 1 capsule by mouth 2 (two) times daily.   Yes [provider]  rosuvastatin (CRESTOR) 10 MG tablet Take 1 tablet (10 mg total) by mouth daily. 03/26/17  Yes Lada, Janit BernMelinda P, MD  tiZANidine (ZANAFLEX) 2 MG tablet Take 2 mg by mouth every 6 (six) hours. 05/12/17  Yes [provider]  vitamin B-12 (CYANOCOBALAMIN) 1000 MCG tablet Take 1 tablet by mouth daily.   Yes [provider]      PHYSICAL EXAMINATION:   VITAL SIGNS: Blood pressure 135/64, pulse 68, temperature 98.8 F (37.1 C), temperature source Oral, resp. rate 17, height 5\' 3"  (1.6 m), weight 68 kg (150 lb), SpO2 95 %.  GENERAL:  83 y.o.-year-old patient lying in the bed with no acute distress.  EYES: Pupils equal, round, reactive to light and accommodation. No scleral icterus. Extraocular muscles intact.  HEENT: Head atraumatic, normocephalic. Oropharynx and nasopharynx clear.  NECK:  Supple, no jugular venous distention. No thyroid enlargement, no tenderness.  LUNGS: Normal breath sounds bilaterally, no wheezing, rales,rhonchi or crepitation. No use of accessory muscles of respiration.  CARDIOVASCULAR: S1, S2 normal. No murmurs, rubs, or gallops.  ABDOMEN: Soft, nontender, nondistended. Bowel sounds present. No organomegaly or mass.  EXTREMITIES: No pedal edema, clubbing Left foot pain Left heel tenderness present Absent pulses left foot NEUROLOGIC: Cranial nerves II through XII are intact. Muscle strength 5/5 in all extremities. Sensation intact. Gait not checked.  PSYCHIATRIC: The patient is alert and oriented x 3.  SKIN: blackish discoloration of skin of little toe  left foot  LABORATORY PANEL:   CBC Recent Labs  Lab 05/23/17 1925  WBC 14.0*  HGB 14.1  HCT 42.8  PLT 193  MCV 92.4  MCH 30.5  MCHC 33.0  RDW 14.4   ------------------------------------------------------------------------------------------------------------------  Chemistries  Recent Labs  Lab 05/23/17 1917  NA 138  K 3.8  CL 103  CO2 24  GLUCOSE 112*  BUN 23*  CREATININE 1.15  CALCIUM 9.2  AST 48*  ALT 43  ALKPHOS 86  BILITOT 0.9   ------------------------------------------------------------------------------------------------------------------ estimated creatinine clearance is 37.1 mL/min (by C-G formula based on SCr of 1.15 mg/dL). ------------------------------------------------------------------------------------------------------------------ No results for input(s): TSH, T4TOTAL, T3FREE, THYROIDAB in the last 72 hours.  Invalid input(s): FREET3   Coagulation profile No results for input(s): INR, PROTIME in the last 168 hours. ------------------------------------------------------------------------------------------------------------------- No results for input(s): DDIMER in the last 72 hours. -------------------------------------------------------------------------------------------------------------------  Cardiac Enzymes No results for input(s): CKMB, TROPONINI, MYOGLOBIN in the last 168 hours.  Invalid input(s): CK ------------------------------------------------------------------------------------------------------------------ Invalid input(s): POCBNP  ---------------------------------------------------------------------------------------------------------------  Urinalysis    Component Value Date/Time   COLORURINE YELLOW 02/14/2010  1358   APPEARANCEUR CLEAR 02/14/2010 1358   LABSPEC 1.030 02/14/2010 1358   PHURINE 5.5 02/14/2010 1358   GLUCOSEU NEGATIVE 02/14/2010 1358   HGBUR NEGATIVE 02/14/2010 1358   BILIRUBINUR SMALL (A) 02/14/2010  1358   KETONESUR 15 (A) 02/14/2010 1358   PROTEINUR NEGATIVE 02/14/2010 1358   UROBILINOGEN 1.0 02/14/2010 1358   NITRITE NEGATIVE 02/14/2010 1358   LEUKOCYTESUR  02/14/2010 1358    NEGATIVE MICROSCOPIC NOT DONE ON URINES WITH NEGATIVE PROTEIN, BLOOD, LEUKOCYTES, NITRITE, OR GLUCOSE <1000 mg/dL.     RADIOLOGY: Dg Pelvis 1-2 Views  Result Date: 05/23/2017 CLINICAL DATA:  Left leg pain after fall in shower 2 days ago. EXAM: PELVIS - 1-2 VIEW COMPARISON:  02/19/2010 FINDINGS: L4-5 and L5-S1 facet and degenerative disc disease. Intact bony pelvis with bilateral press fit arthroplasties of both hips. No prosthetic dislocation or evidence of hardware failure. The femoral components are cemented. Extensive bi-iliofemoral atherosclerosis. IMPRESSION: 1. Lower lumbar degenerative disc and facet arthropathy of the included lumbar spine. 2. No acute osseous abnormality of the bony pelvis. Electronically Signed   By: Tollie Ethavid  Kwon M.D.   On: 05/23/2017 18:15   Dg Tibia/fibula Left  Result Date: 05/23/2017 CLINICAL DATA:  Left leg pain after fall in shower 2 days ago. EXAM: LEFT TIBIA AND FIBULA - 2 VIEW COMPARISON:  None. FINDINGS: Tricompartmental osteoarthritis of the left knee. Intact ankle joint. No acute fracture, joint dislocation or suspicious osseous lesions. Extensive popliteal through tibial arteriosclerosis. No significant soft tissue tissue swelling. IMPRESSION: Tricompartmental osteoarthritis of the knee. No acute fracture of the tibia nor fibula. Electronically Signed   By: Tollie Ethavid  Kwon M.D.   On: 05/23/2017 18:16   Ct Angio Ao+bifem W & Or Wo Contrast  Result Date: 05/23/2017 CLINICAL DATA:  Decreased pulse in left leg. Sudden onset of left leg pain. Patient reports recent fall in the shower. EXAM: CT ANGIOGRAPHY OF ABDOMINAL AORTA WITH ILIOFEMORAL RUNOFF TECHNIQUE: Multidetector CT imaging of the abdomen, pelvis and lower extremities was performed using the standard protocol during bolus  administration of intravenous contrast. Multiplanar CT image reconstructions and MIPs were obtained to evaluate the vascular anatomy. CONTRAST:  125mL ISOVUE-370 IOPAMIDOL (ISOVUE-370) INJECTION 76% COMPARISON:  None. FINDINGS: VASCULAR Aorta: Moderate calcified and noncalcified atheromatous plaque without aneurysm, dissection or periaortic stranding. Scattered luminal stenosis of less than 25%. Celiac: Calcified plaque at the origin with less than 50% stenosis, mild poststenotic dilatation. SMA: Mild plaque at the origin without significant stenosis. Replaced hepatic artery is incidentally noted arising from the SMA. Renals: Plaque at the origin without significant stenosis. No evidence of vasculitis or fibromuscular dysplasia. IMA: Patent with plaque at the origin. RIGHT Lower Extremity Inflow: Common, internal and external iliac arteries are patent without evidence of aneurysm, dissection, vasculitis or significant stenosis. Mild calcified plaque. Outflow: Common, superficial and profunda femoral arteries and the popliteal artery are patent without evidence of aneurysm, dissection, vasculitis or significant stenosis. Mild calcified and noncalcified plaque in the proximal common femoral, and throughout the superficial femoral artery. Runoff: Moderate calcified plaque of the popliteal artery. Posterior tibial and peroneal arteries are patent to the mid lower leg, small in caliber. No flow in the distal peroneal artery. Densely calcified anterior tibial artery which may be occluded proximally, however reconstitutes distally at the ankle. LEFT Lower Extremity Inflow: Common, internal and external iliac arteries are patent without evidence of aneurysm, dissection, vasculitis or significant stenosis. Mild calcified plaque. Outflow: Common, superficial and profunda femoral arteries and the popliteal artery are  patent without evidence of aneurysm, dissection, vasculitis or significant stenosis. Calcified and noncalcified  plaques throughout the SFA, with plaque distally causing moderate stenosis. Abrupt occlusion of the distal SFA at the level of the mid distal femur extending to the popliteal artery. Curvilinear density in the periphery of the distal most SFA may be trace reconstitution versus calcific plaque. Mild stranding about the distal SFA. Runoff: Reconstitution of the anterior tibial artery and tibioperoneal trunk with some flow to the ankle. Possible embolic disease with filling defects in the proximal posterior tibial and peroneal arteries and anterior tibial artery at the level of the ankle. Veins: No obvious venous abnormality within the limitations of this arterial phase study. Review of the MIP images confirms the above findings. NON-VASCULAR Lower chest: Linear atelectasis in the left lower lobe. The heart is normal in size. Hepatobiliary: No focal lesion allowing for arterial phase imaging. Possible small stones or sludge in the gallbladder without abnormal gallbladder distention. Pancreas: Motion artifact through the pancreas, no obvious ductal dilatation or inflammation. Spleen: Normal in size and arterial phase imaging. Splenule superiorly. Adrenals/Urinary Tract: No definite adrenal nodule. Mild thinning of renal parenchyma without hydronephrosis. Homogeneous renal enhancement. Urinary bladder is physiologically distended. Stomach/Bowel: Stomach is decompressed. No bowel dilatation or inflammation. Rather extensive colonic diverticulosis from the splenic flexure distally. No diverticulitis. Lymphatic: Small paraesophageal lymph nodes adjacent of the distal esophagus, of uncertain etiology. No additional enlarged lymph nodes in the abdomen or pelvis. Reproductive: Prostate gland obscured by streak artifact from bilateral hip prostheses. Other: No ascites.  No free air. Musculoskeletal: Bilateral hip arthroplasties. T11 compression fracture appears remote. Multilevel degenerative change in the lumbar spine. No  acute osseous abnormalities in the lower extremities. IMPRESSION: VASCULAR 1. Total or near total occlusion of the distal left superficial femoral artery extending through the popliteal. Distal reconstitution of the calf vessels with intraluminal filling defects in the calf vessels suspicious for embolic disease. 2. Absent flow in the distal right peroneal artery, of uncertain acuity. 3. Calcified and noncalfied atheromatous plaque throughout the abdominal aorta and lower extremity vasculature, severe in bilateral trifurcations. NON-VASCULAR 1. No acute abnormality. 2. Colonic diverticulosis. Possible cholelithiasis or sludge in the gallbladder without complicating features. These results were called by telephone at the time of interpretation on 05/23/2017 at 9:22 pm to [facility] , who verbally acknowledged these results. Electronically Signed   By: Rubye Oaks M.D.   On: 05/23/2017 21:23   Dg Femur Min 2 Views Left  Result Date: 05/23/2017 CLINICAL DATA:  Left leg pain after fall in shower 2 days ago. EXAM: LEFT FEMUR 2 VIEWS COMPARISON:  None. FINDINGS: Tricompartmental osteoarthritis the knee. Press-Fit left hip arthroplasty with cemented femoral component. No acute fracture, suspicious osseous lesion nor joint dislocation. Slightly shallow appearing acetabular component that may predispose patient to joint dislocations. IMPRESSION: 1. No acute osseous abnormality of the left femur. 2. Tricompartmental osteoarthritis of the included left knee. 3. Intact left cemented Press-Fit arthroplasty of the hip. Electronically Signed   By: Tollie Eth M.D.   On: 05/23/2017 18:18    EKG: Orders placed or performed during the hospital encounter of 06/28/15  . ED EKG  . ED EKG  . EKG 12-Lead  . EKG 12-Lead  . EKG    IMPRESSION AND PLAN: 83 year old elderly male patient with history of osteoarthritis, CK D stage III, hypertension, hyperlipidemia, pulmonary embolism presented to the emergency room  with pain in the left foot, blackish discoloration of the little toe  left foot with decreased blood circulation.  Admitting diagnosis 1. 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 Hypertension 3. Hyperlipidemia 4. Chronic kidney disease stage III Treatment plan Admit patient to medical floor Start patient on IV heparin drip for anticoagulation Vascular surgery consultation Start patient on IV Zosyn antibiotic antibiotic Monitor renal function Arteriogram with possible stent placement by vascular surgery Pain management with oral Percocet and when necessary morphine   All the records are reviewed and case discussed with ED provider. Management plans discussed with the patient, family and they are in agreement.  CODE STATUS:Full code  Code Status History    This patient does not have a recorded code status. Please follow your organizational policy for patients in this situation.       TOTAL TIME TAKING CARE OF THIS PATIENT: 52 minutes.    Ihor Austin M.D on 05/23/2017 at 10:34 PM  Between 7am to 6pm - Pager - [phone]  After 6pm go to [email or web address] - password [email or web address] [facility]  [address] [place] [facility]  Office  [phone]  CC: Primary care physician; Kerman Passey, MD

## 2017-05-23 NOTE — ED Provider Notes (Signed)
[facility] EMERGENCY DEPARTMENT Provider Note   CSN: [id number] Arrival date & time: 05/23/17  1621     History   Chief Complaint Chief Complaint  Patient presents with  . Fall    HPI [name] [name] is a 83 y.o. male presents to the emergency department with daughter for evaluation of left lower extremity discomfort.  Patient states he has a history of bilateral knee osteoarthritis, recently received bilateral knee cortisone injections which did not provide him with much relief.  He states 3 days ago he was in the shower and slid down onto his buttocks due to left leg pain.  He denies any episodes of dizziness, lightheadedness.  Patient has left leg discomfort from his hip down to his left foot, mostly from his knee into the foot.  Denies any swelling, warmth, erythema, edema, fevers.  He has had bilateral hip replacements years ago.  He has a hard time ambulating due to recent pain over the last 3 days but able to ambulate with a walker.  Denies any numbness or tingling throughout the left lower extremity.  While sliding down in the bathtub from a standing position, patient denies hitting his head, losing consciousness, nausea, vomiting, headaches or any other injury to his body.  HPI  Past Medical History:  Diagnosis Date  . Arthritis   . CKD (chronic kidney disease) stage 3, GFR 30-59 ml/min (HCC) 12/29/2015  . Hyperlipidemia   . Hypertension   . Left leg DVT (HCC) 01/2014  . Myocardial infarction (HCC)   . Pulmonary embolism (HCC) 01/2014  . Thoracic aorta atherosclerosis (HCC) 10/08/2016    Patient Active Problem List   Diagnosis Date Noted  . Dermatitis 10/08/2016  . Thoracic aorta atherosclerosis (HCC) 10/08/2016  . CKD (chronic kidney disease) stage 3, GFR 30-59 ml/min (HCC) 12/29/2015  . Anemia 12/29/2015  . Medication monitoring encounter 12/29/2015  . Presbycusis 12/29/2015  . Cough 07/05/2015  . History of anticoagulant therapy 01/16/2015    . History of pulmonary embolus (PE) 01/16/2015  . Glaucoma 01/16/2015  . H/O malignant neoplasm of skin 01/16/2015  . Hyperlipidemia LDL goal <70 01/16/2015  . Hypertension goal BP (blood pressure) < 140/90 01/16/2015  . 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 Calculus of kidney 01/16/2015  . Personal history of DVT (deep vein thrombosis) 01/16/2015  . Presence of stent in coronary artery 01/16/2015  . Pruritic dermatitis 01/16/2015  . Coronary artery disease involving native coronary artery of native heart without angina pectoris 10/20/2008    Past Surgical History:  Procedure Laterality Date  . CORONARY STENT PLACEMENT  2006  . JOINT REPLACEMENT  2011 & 2012   both hips  . len replaced Left 2015  . MELANOMA EXCISION     40 years ago       Home Medications    Prior to Admission medications   Medication Sig Start Date End Date Taking? Authorizing Provider  albuterol (PROVENTIL HFA;VENTOLIN HFA) 108 (90 Base) MCG/ACT inhaler Inhale 2 puffs into the lungs every 4 (four) hours as needed for wheezing or shortness of breath. 11/19/16   Lada, Janit Bern, MD  atenolol (TENORMIN) 50 MG tablet TAKE 1 TABLET (50 MG TOTAL) BY MOUTH DAILY. 12/09/16   Kerman Passey, MD  augmented betamethasone dipropionate (DIPROLENE-AF) 0.05 % cream APPLY 1 APPLICATION TOPICALLY TO LEG TWICE DAILY (as needed) 11/06/16   [provider]  loratadine (CLARITIN) 10 MG tablet Take 1 tablet (  10 mg total) by mouth daily as needed for allergies. 11/19/16   Kerman PasseyLada, Melinda P, MD  rosuvastatin (CRESTOR) 10 MG tablet Take 1 tablet (10 mg total) by mouth daily. 03/26/17   Kerman PasseyLada, Melinda P, MD    Family History Family History  Problem Relation Age of Onset  . Arthritis Mother   . Arthritis Father   . Depression Father     Social History Social History   Tobacco Use  . Smoking status: Former Smoker    Types: Pipe  . Smokeless tobacco: Never Used  Substance  Use Topics  . Alcohol use: No    Alcohol/week: 0.0 oz  . Drug use: No     Allergies   Aspirin; Bee pollen; Pollen extract; and Rabbit epithelium   Review of Systems Review of Systems  Constitutional: Negative for fever.  Respiratory: Negative for cough, chest tightness, shortness of breath and wheezing.   Cardiovascular: Negative for chest pain.  Gastrointestinal: Negative for abdominal distention, nausea and vomiting.  Musculoskeletal: Positive for gait problem. Negative for back pain.  Skin: Negative for color change, rash and wound.  Neurological: Negative for dizziness, syncope, numbness and headaches.     Physical Exam Updated Vital Signs BP (!) 154/67 (BP Location: Left Arm)   Pulse 70   Temp 98.8 F (37.1 C) (Oral)   Resp 16   Ht 5\' 3"  (1.6 m)   Wt 68 kg (150 lb)   SpO2 96%   BMI 26.57 kg/m   Physical Exam  Constitutional: He is oriented to person, place, and time. He appears well-developed and well-nourished.  HENT:  Head: Normocephalic and atraumatic.  Eyes: Conjunctivae are normal.  Neck: Normal range of motion.  Cardiovascular: Normal rate and regular rhythm.  Pulmonary/Chest: Effort normal. No stridor. No respiratory distress. He has no wheezes.  Abdominal: Soft. He exhibits no distension. There is no tenderness. There is no guarding.  Musculoskeletal:  Examination of the lower extremity shows patient has no pain with bilateral hip internal and external rotation.  Patient is tender to palpation throughout the left proximal thigh all the way down to the ankle and foot.  There is no swelling warmth or edema throughout the left lower extremity.  Mild redness on the dorsal aspect of the left foot and heel.  He has faint palpable dorsalis pedis pulses on the left side, significantly less on the left than the right.  There is no ulcerations present throughout the left lower extremity.  Blue discoloration to the left fifth toe, also slight discoloration to the left  great toe.  He has normal sensation throughout the left lower extremity but is tender to palpation.  He is able to straight leg raise bilaterally.  Right lower extremity shows mild discomfort to palpation to the right knee.  No discomfort with hip internal or external rotation.  Patient is able to straight leg raise the right lower extremity.  He is neurovascular intact right lower extremity with strong 2+ dorsalis pedis pulses.  Neurological: He is alert and oriented to person, place, and time. No cranial nerve deficit. Coordination normal.  Skin: Skin is warm. No erythema. No pallor.  Psychiatric: He has a normal mood and affect. His behavior is normal. Thought content normal.     ED Treatments / Results  Labs (all labs ordered are listed, but only abnormal results are displayed) Labs Reviewed  COMPREHENSIVE METABOLIC PANEL - Abnormal; Notable for the following components:      Result Value   Glucose,  Bld 112 (*)    BUN 23 (*)    AST 48 (*)    GFR calc non Af Amer 56 (*)    All other components within normal limits  CBC    EKG  EKG Interpretation None       Radiology Dg Pelvis 1-2 Views  Result Date: 05/23/2017 CLINICAL DATA:  Left leg pain after fall in shower 2 days ago. EXAM: PELVIS - 1-2 VIEW COMPARISON:  02/19/2010 FINDINGS: L4-5 and L5-S1 facet and degenerative disc disease. Intact bony pelvis with bilateral press fit arthroplasties of both hips. No prosthetic dislocation or evidence of hardware failure. The femoral components are cemented. Extensive bi-iliofemoral atherosclerosis. IMPRESSION: 1. Lower lumbar degenerative disc and facet arthropathy of the included lumbar spine. 2. No acute osseous abnormality of the bony pelvis. Electronically Signed   By: Tollie Eth M.D.   On: 05/23/2017 18:15   Dg Tibia/fibula Left  Result Date: 05/23/2017 CLINICAL DATA:  Left leg pain after fall in shower 2 days ago. EXAM: LEFT TIBIA AND FIBULA - 2 VIEW COMPARISON:  None. FINDINGS:  Tricompartmental osteoarthritis of the left knee. Intact ankle joint. No acute fracture, joint dislocation or suspicious osseous lesions. Extensive popliteal through tibial arteriosclerosis. No significant soft tissue tissue swelling. IMPRESSION: Tricompartmental osteoarthritis of the knee. No acute fracture of the tibia nor fibula. Electronically Signed   By: Tollie Eth M.D.   On: 05/23/2017 18:16   Ct Angio Ao+bifem W & Or Wo Contrast  Result Date: 05/23/2017 CLINICAL DATA:  Decreased pulse in left leg. Sudden onset of left leg pain. Patient reports recent fall in the shower. EXAM: CT ANGIOGRAPHY OF ABDOMINAL AORTA WITH ILIOFEMORAL RUNOFF TECHNIQUE: Multidetector CT imaging of the abdomen, pelvis and lower extremities was performed using the standard protocol during bolus administration of intravenous contrast. Multiplanar CT image reconstructions and MIPs were obtained to evaluate the vascular anatomy. CONTRAST:  ISOVUE-370 IOPAMIDOL (ISOVUE-370) INJECTION 76% COMPARISON:  None. FINDINGS: VASCULAR Aorta: Moderate calcified and noncalcified atheromatous plaque without aneurysm, dissection or periaortic stranding. Scattered luminal stenosis of less than 25%. Celiac: Calcified plaque at the origin with less than 50% stenosis, mild poststenotic dilatation. SMA: Mild plaque at the origin without significant stenosis. Replaced hepatic artery is incidentally noted arising from the SMA. Renals: Plaque at the origin without significant stenosis. No evidence of vasculitis or fibromuscular dysplasia. IMA: Patent with plaque at the origin. RIGHT Lower Extremity Inflow: Common, internal and external iliac arteries are patent without evidence of aneurysm, dissection, vasculitis or significant stenosis. Mild calcified plaque. Outflow: Common, superficial and profunda femoral arteries and the popliteal artery are patent without evidence of aneurysm, dissection, vasculitis or significant stenosis. Mild calcified and  noncalcified plaque in the proximal common femoral, and throughout the superficial femoral artery. Runoff: Moderate calcified plaque of the popliteal artery. Posterior tibial and peroneal arteries are patent to the mid lower leg, small in caliber. No flow in the distal peroneal artery. Densely calcified anterior tibial artery which may be occluded proximally, however reconstitutes distally at the ankle. LEFT Lower Extremity Inflow: Common, internal and external iliac arteries are patent without evidence of aneurysm, dissection, vasculitis or significant stenosis. Mild calcified plaque. Outflow: Common, superficial and profunda femoral arteries and the popliteal artery are patent without evidence of aneurysm, dissection, vasculitis or significant stenosis. Calcified and noncalcified plaques throughout the SFA, with plaque distally causing moderate stenosis. Abrupt occlusion of the distal SFA at the level of the mid distal femur extending to the popliteal artery.  Curvilinear density in the periphery of the distal most SFA may be trace reconstitution versus calcific plaque. Mild stranding about the distal SFA. Runoff: Reconstitution of the anterior tibial artery and tibioperoneal trunk with some flow to the ankle. Possible embolic disease with filling defects in the proximal posterior tibial and peroneal arteries and anterior tibial artery at the level of the ankle. Veins: No obvious venous abnormality within the limitations of this arterial phase study. Review of the MIP images confirms the above findings. NON-VASCULAR Lower chest: Linear atelectasis in the left lower lobe. The heart is normal in size. Hepatobiliary: No focal lesion allowing for arterial phase imaging. Possible small stones or sludge in the gallbladder without abnormal gallbladder distention. Pancreas: Motion artifact through the pancreas, no obvious ductal dilatation or inflammation. Spleen: Normal in size and arterial phase imaging. Splenule  superiorly. Adrenals/Urinary Tract: No definite adrenal nodule. Mild thinning of renal parenchyma without hydronephrosis. Homogeneous renal enhancement. Urinary bladder is physiologically distended. Stomach/Bowel: Stomach is decompressed. No bowel dilatation or inflammation. Rather extensive colonic diverticulosis from the splenic flexure distally. No diverticulitis. Lymphatic: Small paraesophageal lymph nodes adjacent of the distal esophagus, of uncertain etiology. No additional enlarged lymph nodes in the abdomen or pelvis. Reproductive: Prostate gland obscured by streak artifact from bilateral hip prostheses. Other: No ascites.  No free air. Musculoskeletal: Bilateral hip arthroplasties. T11 compression fracture appears remote. Multilevel degenerative change in the lumbar spine. No acute osseous abnormalities in the lower extremities. IMPRESSION: VASCULAR 1. Total or near total occlusion of the distal left superficial femoral artery extending through the popliteal. Distal reconstitution of the calf vessels with intraluminal filling defects in the calf vessels suspicious for embolic disease. 2. Absent flow in the distal right peroneal artery, of uncertain acuity. 3. Calcified and noncalfied atheromatous plaque throughout the abdominal aorta and lower extremity vasculature, severe in bilateral trifurcations. NON-VASCULAR 1. No acute abnormality. 2. Colonic diverticulosis. Possible cholelithiasis or sludge in the gallbladder without complicating features. These results were called by telephone at the time of interpretation on 05/23/2017 at 9:22 pm to [facility] Fend[facility] , who verbally acknowledged these results. Electronically Signed   By: Rubye OaksMelanie  Ehinger M.D.   On: 05/23/2017 21:23   Dg Femur Min 2 Views Left  Result Date: 05/23/2017 CLINICAL DATA:  Left leg pain after fall in shower 2 days ago. EXAM: LEFT FEMUR 2 VIEWS COMPARISON:  None. FINDINGS: Tricompartmental osteoarthritis the knee. Press-Fit left hip  arthroplasty with cemented femoral component. No acute fracture, suspicious osseous lesion nor joint dislocation. Slightly shallow appearing acetabular component that may predispose patient to joint dislocations. IMPRESSION: 1. No acute osseous abnormality of the left femur. 2. Tricompartmental osteoarthritis of the included left knee. 3. Intact left cemented Press-Fit arthroplasty of the hip. Electronically Signed   By: Tollie Ethavid  Kwon M.D.   On: 05/23/2017 18:18    Procedures Procedures (including critical care time)  Medications Ordered in ED Medications  sodium chloride 0.9 % bolus 1,000 mL (0 mLs Intravenous Stopped 05/23/17 2109)  iopamidol (ISOVUE-370) 76 % injection 125 mL (125 mLs Intravenous Contrast Given 05/23/17 2017)     Initial Impression / Assessment and Plan / ED Course  I have reviewed the triage vital signs and the nursing notes.  Pertinent labs & imaging results that were available during my care of the patient were reviewed by me and considered in my medical decision making (see chart for details).     83 year old male with left leg pain.  X-ray showed no evidence of  acute bony abnormality.  Due to discoloration to the left little toe, great toe and significantly decreased dorsalis pedis pulses in the left foot, CT angiogram was ordered and showed significant arterial occlusion of the left lower extremity.  Vascular surgeon paged and discussed CT angiogram of the left lower extremity.  They recommend admitting the patient, starting him on heparin.  Vascular surgeon will evaluate patient and may need to proceed with possible arteriogram.  Final Clinical Impressions(s) / ED Diagnoses   Final diagnoses:  Left leg pain  Arterial obstructive disease    ED Discharge Orders    None       Alika, Saladin 05/23/17 2154    Phineas Semen, MD 05/23/17 2315

## 2017-05-23 NOTE — Progress Notes (Signed)
Pharmacy Antibiotic Note  Nedra Haihomas E Koepp is a 83 y.o. male admitted on 05/23/2017 with possible necrotic foot infection (blackish discoloration of fifth toe and left foot) s/t femoral artery occlusion. Pharmacy has been consulted for zosyn dosing.  Plan: Zosyn 3.375g IV q8h (4 hour infusion).  Height: 5\' 3"  (160 cm) Weight: 150 lb (68 kg) IBW/kg (Calculated) : 56.9  Temp (24hrs), Avg:98.8 F (37.1 C), Min:98.8 F (37.1 C), Max:98.8 F (37.1 C)  Recent Labs  Lab 05/23/17 1917 05/23/17 1925  WBC  --  14.0*  CREATININE 1.15  --     Estimated Creatinine Clearance: 37.1 mL/min (by C-G formula based on SCr of 1.15 mg/dL).    Allergies  Allergen Reactions  . Aspirin Anaphylaxis, Swelling and Shortness Of Breath    REACTION: terrible bp reaction/wheezing  . Bee Pollen Other (See Comments)    ragweed  . Pollen Extract     ragweed  . Rabbit Epithelium     rabbits    Thank you for allowing pharmacy to be a part of this patient's care.  Thomasene Rippleavid Khoa Opdahl, PharmD, BCPS Clinical Pharmacist 05/23/2017

## 2017-05-23 NOTE — Progress Notes (Signed)
ANTICOAGULATION CONSULT NOTE - Initial Consult  Pharmacy Consult for heparin Indication: Left and right femoral arterial occlusion  Allergies  Allergen Reactions  . Aspirin Anaphylaxis, Swelling and Shortness Of Breath    REACTION: terrible bp reaction/wheezing  . Bee Pollen Other (See Comments)    ragweed  . Pollen Extract     ragweed  . Rabbit Epithelium     rabbits    Patient Measurements: Height: 5\' 3"  (160 cm) Weight: 150 lb (68 kg) IBW/kg (Calculated) : 56.9 Heparin Dosing Weight: 68 kg  Vital Signs: Temp: 98.8 F (37.1 C) (11/23 1627) Temp Source: Oral (11/23 1627) BP: 135/64 (11/23 2221) Pulse Rate: 68 (11/23 2221)  Labs: Recent Labs    05/23/17 1917 05/23/17 1925  HGB  --  14.1  HCT  --  42.8  PLT  --  193  CREATININE 1.15  --     Estimated Creatinine Clearance: 37.1 mL/min (by C-G formula based on SCr of 1.15 mg/dL).   Medical History: Past Medical History:  Diagnosis Date  . Arthritis   . CKD (chronic kidney disease) stage 3, GFR 30-59 ml/min (HCC) 12/29/2015  . Hyperlipidemia   . Hypertension   . Left leg DVT (HCC) 01/2014  . Myocardial infarction (HCC)   . Pulmonary embolism (HCC) 01/2014  . Thoracic aorta atherosclerosis (HCC) 10/08/2016    Medications:  Scheduled:  . heparin  5,000 Units Intravenous Once    Assessment: Patient admitted s/t to fall in bathtub resulting in painful lower extremities and difficulty walking. Patient has a h/o of bilateral hip replacements and on CT shown to have a total or near total occlusion of the left femoral artery and absent flow in the right peroneal artery. Patient does not appear to be on any PTA anticoagulation. Heparin drip is being started for femoral occlusion  Goal of Therapy:  Heparin level 0.3-0.7 units/ml Monitor platelets by anticoagulation protocol: Yes   Plan:  Considering patient has active clot will bolus w/ heparin 5000 units IV x 1 Will start drip @ 1100 units/hr. Will check an  anti-Xa level @ 0700. Baseline labs ordered. Will monitor daily CBC's and adjust per anti-Xa levels.  Thomasene Rippleavid Bani Gianfrancesco, PharmD, BCPS Clinical Pharmacist 05/23/2017

## 2017-05-23 NOTE — ED Notes (Addendum)
Patient was given food tray and water by request of family.

## 2017-05-23 NOTE — ED Notes (Signed)
Admission MD at bedside.  

## 2017-05-23 NOTE — ED Triage Notes (Signed)
Patient presents to the ED with bilateral leg pain x 2 days post fall in shower.  Patient denies loss of consciousness and denies hitting his head.  Patient is alert and oriented x 4 but is hard of hearing.

## 2017-05-23 NOTE — ED Notes (Signed)
Patient transported to X-ray 

## 2017-05-24 DIAGNOSIS — I2510 Atherosclerotic heart disease of native coronary artery without angina pectoris: Secondary | ICD-10-CM | POA: Insufficient documentation

## 2017-05-24 LAB — BASIC METABOLIC PANEL
ANION GAP: 7 (ref 5–15)
BUN: 17 mg/dL (ref 6–20)
CALCIUM: 8.5 mg/dL — AB (ref 8.9–10.3)
CO2: 24 mmol/L (ref 22–32)
CREATININE: 1.1 mg/dL (ref 0.61–1.24)
Chloride: 107 mmol/L (ref 101–111)
GFR, EST NON AFRICAN AMERICAN: 59 mL/min — AB (ref >60)
Glucose, Bld: 101 mg/dL — ABNORMAL HIGH (ref 65–99)
Potassium: 3.7 mmol/L (ref 3.5–5.1)
Sodium: 138 mmol/L (ref 135–145)

## 2017-05-24 LAB — CBC
HCT: 38.7 % — ABNORMAL LOW (ref 40.0–52.0)
HEMOGLOBIN: 13 g/dL (ref 13.0–18.0)
MCH: 30.8 pg (ref 26.0–34.0)
MCHC: 33.6 g/dL (ref 32.0–36.0)
MCV: 91.6 fL (ref 80.0–100.0)
PLATELETS: 163 10*3/uL (ref 150–440)
RBC: 4.23 MIL/uL — AB (ref 4.40–5.90)
RDW: 14.3 % (ref 11.5–14.5)
WBC: 9.9 10*3/uL (ref 3.8–10.6)

## 2017-05-24 LAB — HEPARIN LEVEL (UNFRACTIONATED)
HEPARIN UNFRACTIONATED: 1.08 [IU]/mL — AB (ref 0.30–0.70)
HEPARIN UNFRACTIONATED: 0.42 [IU]/mL (ref 0.30–0.70)

## 2017-05-24 MED ORDER — LORATADINE 10 MG PO TABS
10.0000 mg | ORAL_TABLET | Freq: Every day | ORAL | Status: DC | PRN
Start: 2017-05-24 — End: 2017-05-28

## 2017-05-24 MED ORDER — DONEPEZIL HCL 5 MG PO TABS
5.0000 mg | ORAL_TABLET | Freq: Every evening | ORAL | Status: DC
  Administered 2017-05-24 – 2017-05-27 (×4): 5 mg via ORAL
  Filled 2017-05-24 (×5): qty 1

## 2017-05-24 MED ORDER — VITAMIN B-12 1000 MCG PO TABS
1000.0000 ug | ORAL_TABLET | Freq: Every day | ORAL | Status: DC
  Administered 2017-05-24 – 2017-05-28 (×4): 1000 ug via ORAL
  Filled 2017-05-24 (×4): qty 1

## 2017-05-24 MED ORDER — SODIUM CHLORIDE 0.9% FLUSH
3.0000 mL | Freq: Two times a day (BID) | INTRAVENOUS | Status: DC
  Administered 2017-05-24 – 2017-05-28 (×9): 3 mL via INTRAVENOUS

## 2017-05-24 MED ORDER — TIZANIDINE HCL 2 MG PO TABS
2.0000 mg | ORAL_TABLET | Freq: Four times a day (QID) | ORAL | Status: DC
  Administered 2017-05-24 – 2017-05-28 (×18): 2 mg via ORAL
  Filled 2017-05-24 (×20): qty 1

## 2017-05-24 MED ORDER — HEPARIN (PORCINE) IN NACL 100-0.45 UNIT/ML-% IJ SOLN
1000.0000 [IU]/h | INTRAMUSCULAR | Status: AC
  Administered 2017-05-24 – 2017-05-26 (×3): 900 [IU]/h via INTRAVENOUS
  Administered 2017-05-27: 1000 [IU]/h via INTRAVENOUS
  Filled 2017-05-24 (×3): qty 250

## 2017-05-24 MED ORDER — ACETAMINOPHEN 325 MG PO TABS: 650.0000 mg | ORAL_TABLET | Freq: Four times a day (QID) | ORAL | Status: DC | PRN

## 2017-05-24 MED ORDER — ALBUTEROL SULFATE (2.5 MG/3ML) 0.083% IN NEBU: 2.5000 mg | INHALATION_SOLUTION | RESPIRATORY_TRACT | Status: DC | PRN

## 2017-05-24 MED ORDER — ACETAMINOPHEN 650 MG RE SUPP: 650.0000 mg | Freq: Four times a day (QID) | RECTAL | Status: DC | PRN

## 2017-05-24 MED ORDER — OMEGA-3-ACID ETHYL ESTERS 1 G PO CAPS
1.0000 g | ORAL_CAPSULE | Freq: Every day | ORAL | Status: DC
  Administered 2017-05-24 – 2017-05-28 (×4): 1 g via ORAL
  Filled 2017-05-24 (×4): qty 1

## 2017-05-24 MED ORDER — SODIUM CHLORIDE 0.9 % IV SOLN: 250.0000 mL | INTRAVENOUS | Status: DC | PRN

## 2017-05-24 MED ORDER — SODIUM CHLORIDE 0.9% FLUSH: 3.0000 mL | INTRAVENOUS | Status: DC | PRN

## 2017-05-24 MED ORDER — ATENOLOL 50 MG PO TABS
50.0000 mg | ORAL_TABLET | Freq: Every day | ORAL | Status: DC
  Administered 2017-05-24 – 2017-05-28 (×5): 50 mg via ORAL
  Filled 2017-05-24 (×5): qty 1

## 2017-05-24 MED ORDER — SENNOSIDES-DOCUSATE SODIUM 8.6-50 MG PO TABS: 1.0000 | ORAL_TABLET | Freq: Every evening | ORAL | Status: DC | PRN

## 2017-05-24 MED ORDER — ROSUVASTATIN CALCIUM 10 MG PO TABS
10.0000 mg | ORAL_TABLET | Freq: Every day | ORAL | Status: DC
  Administered 2017-05-24 – 2017-05-27 (×4): 10 mg via ORAL
  Filled 2017-05-24 (×4): qty 1

## 2017-05-24 MED ORDER — VITAMIN D 1000 UNITS PO TABS
5000.0000 [IU] | ORAL_TABLET | Freq: Every day | ORAL | Status: DC
  Administered 2017-05-24 – 2017-05-28 (×4): 5000 [IU] via ORAL
  Filled 2017-05-24 (×4): qty 5

## 2017-05-24 MED ORDER — ONDANSETRON HCL 4 MG PO TABS: 4.0000 mg | ORAL_TABLET | Freq: Four times a day (QID) | ORAL | Status: DC | PRN

## 2017-05-24 MED ORDER — ONDANSETRON HCL 4 MG/2ML IJ SOLN: 4.0000 mg | Freq: Four times a day (QID) | INTRAMUSCULAR | Status: DC | PRN

## 2017-05-24 NOTE — Progress Notes (Signed)
ANTICOAGULATION CONSULT NOTE - Follow Up Consult  Pharmacy Consult for heparin Indication: Left and right femoral arterial occlusion  Allergies  Allergen Reactions  . Aspirin Anaphylaxis, Swelling and Shortness Of Breath    REACTION: terrible bp reaction/wheezing  . Bee Pollen Other (See Comments)    ragweed  . Pollen Extract     ragweed  . Rabbit Epithelium     rabbits    Patient Measurements: Height: 5\' 3"  (160 cm) Weight: 150 lb (68 kg) IBW/kg (Calculated) : 56.9 Heparin Dosing Weight: 68 kg  Vital Signs: Temp: 98.1 F (36.7 C) (11/24 0526) Temp Source: Oral (11/24 0526) BP: 123/64 (11/24 0526) Pulse Rate: 64 (11/24 0526)  Labs: Recent Labs    05/23/17 1917 05/23/17 1925 05/23/17 2316 05/24/17 0650  HGB  --  14.1  --  13.0  HCT  --  42.8  --  38.7*  PLT  --  193  --  163  APTT  --   --  29  --   LABPROT  --   --  13.8  --   INR  --   --  1.07  --   HEPARINUNFRC  --   --   --  1.08*  CREATININE 1.15  --   --   --     Estimated Creatinine Clearance: 37.1 mL/min (by C-G formula based on SCr of 1.15 mg/dL).   Medical History: Past Medical History:  Diagnosis Date  . Arthritis   . CKD (chronic kidney disease) stage 3, GFR 30-59 ml/min (HCC) 12/29/2015  . Hyperlipidemia   . Hypertension   . Left leg DVT (HCC) 01/2014  . Myocardial infarction (HCC)   . Pulmonary embolism (HCC) 01/2014  . Thoracic aorta atherosclerosis (HCC) 10/08/2016    Medications:  Scheduled:  . atenolol  50 mg Oral Daily  . cholecalciferol  5,000 Units Oral Daily  . donepezil  5 mg Oral QPM  . omega-3 acid ethyl esters  1 g Oral Daily  . pneumococcal 23 valent vaccine  0.5 mL Intramuscular Tomorrow-1000  . rosuvastatin  10 mg Oral Daily  . sodium chloride flush  3 mL Intravenous Q12H  . tiZANidine  2 mg Oral Q6H  . vitamin B-12  1,000 mcg Oral Daily    Assessment: Patient admitted s/t to fall in bathtub resulting in painful lower extremities and difficulty walking. Patient has  a h/o of bilateral hip replacements and on CT shown to have a total or near total occlusion of the left femoral artery and absent flow in the right peroneal artery. Patient does not appear to be on any PTA anticoagulation. Heparin drip is being started for femoral occlusion  Goal of Therapy:  Heparin level 0.3-0.7 units/ml Monitor platelets by anticoagulation protocol: Yes   Plan:  Heparin level resulted @ 1.06. Will hold heparin gtt for 1 hour then will reduce heparin gtt rate to 900 unit/hr. Will then recheck Heparin level @ 1700.    Demetrius Charityeldrin D. Larance Ratledge, PharmD  05/24/2017

## 2017-05-24 NOTE — Consult Note (Signed)
[facility]  Patient name: [name] [name] MRN: [id number] DOB: 23-Feb-1928 Sex: male   REQUESTING PROVIDER:    ER   REASON FOR CONSULT:    Left fifth toe wound  HISTORY OF PRESENT ILLNESS:   [name] [name] is a 83 y.o. male, who presented to the emergency department tonight with pain in his left foot.  He lost balance and slid down in the bathtub.  The pain in his foot has been going on for approximately 1 week.  He does use a cane for ambulation and therefore does not endorse any claudication symptoms as he does not ambulate for long distances.  He had a CT scan which showed chronic vascular disease.  The patient suffers from stage III renal insufficiency.  He is medically managed for hypertension with an ARB.  He takes a statin for hypercholesterolemia.  He is a non-smoker.  He does have a history of smoking.  He has had bilateral hip replacement.  PAST MEDICAL HISTORY    Past Medical History:  Diagnosis Date  . Arthritis   . CKD (chronic kidney disease) stage 3, GFR 30-59 ml/min (HCC) 12/29/2015  . Hyperlipidemia   . Hypertension   . Left leg DVT (HCC) 01/2014  . Myocardial infarction (HCC)   . Pulmonary embolism (HCC) 01/2014  . Thoracic aorta atherosclerosis (HCC) 10/08/2016     FAMILY HISTORY   Family History  Problem Relation Age of Onset  . Arthritis Mother   . Arthritis Father   . Depression Father     SOCIAL HISTORY:   Social History   Socioeconomic History  . Marital status: Married    Spouse name: Not on file  . Number of children: Not on file  . Years of education: Not on file  . Highest education level: Not on file  Social Needs  . Financial resource strain: Not on file  . Food insecurity - worry: Not on file  . Food insecurity - inability: Not on file  . Transportation needs - medical: Not on file  . Transportation needs - non-medical: Not on file  Occupational History   Employer: RETIRED  Tobacco Use  . Smoking status: Former Smoker    Types: Pipe  . Smokeless tobacco: Never Used  Substance and Sexual Activity  . Alcohol use: No    Alcohol/week: 0.0 oz  . Drug use: No  . Sexual activity: No  Other Topics Concern  . Not on file  Social History Narrative  . Not on file    ALLERGIES:    Allergies  Allergen Reactions  . Aspirin Anaphylaxis, Swelling and Shortness Of Breath    REACTION: terrible bp reaction/wheezing  . Bee Pollen Other (See Comments)    ragweed  . Pollen Extract     ragweed  . Rabbit Epithelium     rabbits    CURRENT MEDICATIONS:    Current Facility-Administered Medications  Medication Dose Route Frequency Provider Last Rate Last Dose  . 0.9 %  sodium chloride infusion  250 mL Intravenous PRN Pyreddy, Vivien RotaPavan, MD      . acetaminophen (TYLENOL) tablet 650 mg  650 mg Oral Q6H PRN Pyreddy, Vivien RotaPavan, MD       Or  . acetaminophen (TYLENOL) suppository 650 mg  650 mg Rectal Q6H PRN Pyreddy, Pavan, MD      . albuterol (PROVENTIL) (2.5 MG/3ML) 0.083% nebulizer solution 2.5 mg  2.5 mg Inhalation Q4H PRN Ihor AustinPyreddy, Pavan, MD      .  atenolol (TENORMIN) tablet 50 mg  50 mg Oral Daily Pyreddy, Vivien RotaPavan, MD   50 mg at 05/24/17 0905  . cholecalciferol (VITAMIN D) tablet 5,000 Units  5,000 Units Oral Daily Ihor AustinPyreddy, Pavan, MD   5,000 Units at 05/24/17 0905  . donepezil (ARICEPT) tablet 5 mg  5 mg Oral QPM Pyreddy, Pavan, MD      . heparin ADULT infusion 100 units/mL (25000 units/24650mL sodium chloride 0.45%)  900 Units/hr Intravenous Continuous Demetrius CharityJames, Teldrin D, RPH 9 mL/hr at 05/24/17 0906 900 Units/hr at 05/24/17 0906  . loratadine (CLARITIN) tablet 10 mg  10 mg Oral Daily PRN Pyreddy, Vivien RotaPavan, MD      . morphine 2 MG/ML injection 2 mg  2 mg Intravenous Q4H PRN Pyreddy, Pavan, MD      . omega-3 acid ethyl esters (LOVAZA) capsule 1 g  1 g Oral Daily Pyreddy, Vivien RotaPavan, MD   1 g at 05/24/17 0905  . ondansetron (ZOFRAN) tablet 4 mg  4 mg Oral Q6H PRN  Pyreddy, Vivien RotaPavan, MD       Or  . ondansetron (ZOFRAN) injection 4 mg  4 mg Intravenous Q6H PRN Pyreddy, Vivien RotaPavan, MD      . oxyCODONE-acetaminophen (PERCOCET/ROXICET) 5-325 MG per tablet 1 tablet  1 tablet Oral Q4H PRN Pyreddy, Vivien RotaPavan, MD      . pneumococcal 23 valent vaccine (PNU-IMMUNE) injection 0.5 mL  0.5 mL Intramuscular Tomorrow-1000 Pyreddy, Pavan, MD      . rosuvastatin (CRESTOR) tablet 10 mg  10 mg Oral Daily Pyreddy, Pavan, MD      . senna-docusate (Senokot-S) tablet 1 tablet  1 tablet Oral QHS PRN Pyreddy, Vivien RotaPavan, MD      . sodium chloride flush (NS) 0.9 % injection 3 mL  3 mL Intravenous Q12H Pyreddy, Vivien RotaPavan, MD   3 mL at 05/24/17 0029  . sodium chloride flush (NS) 0.9 % injection 3 mL  3 mL Intravenous PRN Pyreddy, Vivien RotaPavan, MD      . tiZANidine (ZANAFLEX) tablet 2 mg  2 mg Oral Q6H Pyreddy, Pavan, MD   2 mg at 05/24/17 1155  . vitamin B-12 (CYANOCOBALAMIN) tablet 1,000 mcg  1,000 mcg Oral Daily Pyreddy, Vivien RotaPavan, MD   1,000 mcg at 05/24/17 0905    REVIEW OF SYSTEMS:   [X]  denotes positive finding, [ ]  denotes negative finding Cardiac  Comments:  Chest pain or chest pressure:    Shortness of breath upon exertion:    Short of breath when lying flat:    Irregular heart rhythm:        Vascular    Pain in calf, thigh, or hip brought on by ambulation:    Pain in feet at night that wakes you up from your sleep:  x   Blood clot in your veins:    Leg swelling:         Pulmonary    Oxygen at home:    Productive cough:     Wheezing:         Neurologic    Sudden weakness in arms or legs:     Sudden numbness in arms or legs:     Sudden onset of difficulty speaking or slurred speech:    Temporary loss of vision in one eye:     Problems with dizziness:         Gastrointestinal    Blood in stool:      Vomited blood:         Genitourinary    Burning when urinating:  Blood in urine:        Psychiatric    Major depression:         Hematologic    Bleeding problems:    Problems  with blood clotting too easily:        Skin    Rashes or ulcers: x       Constitutional    Fever or chills:     PHYSICAL EXAM:   Vitals:   05/23/17 2221 05/23/17 2320 05/24/17 0526 05/24/17 1354  BP: 135/64 (!) 144/69 123/64 (!) 108/52  Pulse: 68 71 64 61  Resp: 17 (!) 21 18 20   Temp:  (!) 97.5 F (36.4 C) 98.1 F (36.7 C) 97.9 F (36.6 C)  TempSrc:  Oral Oral Oral  SpO2: 95% 100% 94% 97%  Weight:      Height:        GENERAL: The patient is a well-nourished male, in no acute distress. The vital signs are documented above. CARDIAC: There is a regular rate and rhythm.  VASCULAR: Palpable femoral pulses.  I cannot palpate pedal pulses. PULMONARY: Nonlabored respirations ABDOMEN: Soft and non-tender with normal pitched bowel sounds.  MUSCULOSKELETAL: There are no major deformities or cyanosis. NEUROLOGIC: No focal weakness or paresthesias are detected. SKIN: Dry eschar around the lateral aspect of the left fifth toe with surrounding erythema PSYCHIATRIC: The patient has a normal affect.  STUDIES:   I have reviewed his CT angiogram with the following findings: VASCULAR  1. Total or near total occlusion of the distal left superficial femoral artery extending through the popliteal. Distal reconstitution of the calf vessels with intraluminal filling defects in the calf vessels suspicious for embolic disease. 2. Absent flow in the distal right peroneal artery, of uncertain acuity. 3. Calcified and noncalfied atheromatous plaque throughout the abdominal aorta and lower extremity vasculature, severe in bilateral trifurcations.  NON-VASCULAR  1. No acute abnormality. 2. Colonic diverticulosis. Possible cholelithiasis or sludge in the gallbladder without complicating features. These results were called by telephone at the time of interpretation on 05/23/2017 at 9:22 pm to PA [facility] , who verbally acknowledged these results.  ASSESSMENT and PLAN   Left fifth  toe wound in the setting of vascular compromise: The patient does have evidence of cellulitis around the wound.  I have recommended admission for IV antibiotics and medical optimization with plans for lower extremity angiography and possible intervention to be scheduled on Monday.   Durene Cal, MD [facility] [phone] Pager [phone]

## 2017-05-24 NOTE — Progress Notes (Deleted)
Cardiology Office Note  Date:  05/24/2017   ID:  [name] [name], DOB [birth year], MRN [id number]  PCP:  Kerman PasseyLada, Melinda P, MD   No chief complaint on file.   HPI:   CAD, stent COPD, hx of smoking CKD HTN DVT Aortic athero Hyperlipidemia  Cath 2004 occlusion in the left anterior descending coronary artery. There was a stenosis in the right coronary artery which was treated with a stent.  Stress test 12/2009  PMH:   has a past medical history of Arthritis, CKD (chronic kidney disease) stage 3, GFR 30-59 ml/min (HCC) (12/29/2015), Hyperlipidemia, Hypertension, Left leg DVT (HCC) (01/2014), Myocardial infarction [facility](HCC), Pulmonary embolism (HCC) (01/2014), and Thoracic aorta atherosclerosis (HCC) (10/08/2016).  PSH:    Past Surgical History:  Procedure Laterality Date  . CORONARY STENT PLACEMENT  2006  . JOINT REPLACEMENT  2011 & 2012   both hips  . len replaced Left 2015  . MELANOMA EXCISION     40 years ago    No current facility-administered medications for this visit.    No current outpatient medications on file.   Facility-Administered Medications Ordered in Other Visits  Medication Dose Route Frequency Provider Last Rate Last Dose  . 0.9 %  sodium chloride infusion  250 mL Intravenous PRN Pyreddy, Vivien RotaPavan, MD      . acetaminophen (TYLENOL) tablet 650 mg  650 mg Oral Q6H PRN Pyreddy, Vivien RotaPavan, MD       Or  . acetaminophen (TYLENOL) suppository 650 mg  650 mg Rectal Q6H PRN Pyreddy, Pavan, MD      . albuterol (PROVENTIL) (2.5 MG/3ML) 0.083% nebulizer solution 2.5 mg  2.5 mg Inhalation Q4H PRN Pyreddy, Vivien RotaPavan, MD      . atenolol (TENORMIN) tablet 50 mg  50 mg Oral Daily Pyreddy, Vivien RotaPavan, MD   50 mg at 05/24/17 0905  . cholecalciferol (VITAMIN D) tablet 5,000 Units  5,000 Units Oral Daily Ihor AustinPyreddy, Pavan, MD   5,000 Units at 05/24/17 0905  . donepezil (ARICEPT) tablet 5 mg  5 mg Oral QPM Pyreddy, Pavan, MD      . heparin ADULT infusion 100 units/mL (25000 units/24750mL sodium  chloride 0.45%)  900 Units/hr Intravenous Continuous Demetrius CharityJames, Teldrin D, RPH 9 mL/hr at 05/24/17 0906 900 Units/hr at 05/24/17 0906  . loratadine (CLARITIN) tablet 10 mg  10 mg Oral Daily PRN Pyreddy, Vivien RotaPavan, MD      . morphine 2 MG/ML injection 2 mg  2 mg Intravenous Q4H PRN Pyreddy, Pavan, MD      . omega-3 acid ethyl esters (LOVAZA) capsule 1 g  1 g Oral Daily Pyreddy, Vivien RotaPavan, MD   1 g at 05/24/17 0905  . ondansetron (ZOFRAN) tablet 4 mg  4 mg Oral Q6H PRN Pyreddy, Vivien RotaPavan, MD       Or  . ondansetron (ZOFRAN) injection 4 mg  4 mg Intravenous Q6H PRN Pyreddy, Vivien RotaPavan, MD      . oxyCODONE-acetaminophen (PERCOCET/ROXICET) 5-325 MG per tablet 1 tablet  1 tablet Oral Q4H PRN Pyreddy, Pavan, MD      . pneumococcal 23 valent vaccine (PNU-IMMUNE) injection 0.5 mL  0.5 mL Intramuscular Tomorrow-1000 Pyreddy, Pavan, MD      . rosuvastatin (CRESTOR) tablet 10 mg  10 mg Oral Daily Pyreddy, Pavan, MD      . senna-docusate (Senokot-S) tablet 1 tablet  1 tablet Oral QHS PRN Pyreddy, Pavan, MD      . sodium chloride flush (NS) 0.9 % injection 3 mL  3 mL Intravenous Q12H Pyreddy, Pavan,  MD   3 mL at 05/24/17 0029  . sodium chloride flush (NS) 0.9 % injection 3 mL  3 mL Intravenous PRN Pyreddy, Vivien RotaPavan, MD      . tiZANidine (ZANAFLEX) tablet 2 mg  2 mg Oral Q6H Pyreddy, Pavan, MD   2 mg at 05/24/17 1155  . vitamin B-12 (CYANOCOBALAMIN) tablet 1,000 mcg  1,000 mcg Oral Daily Pyreddy, Vivien RotaPavan, MD   1,000 mcg at 05/24/17 0905     Allergies:   Aspirin; Bee pollen; Pollen extract; and Rabbit epithelium   Social History:  The patient  reports that he has quit smoking. His smoking use included pipe. he has never used smokeless tobacco. He reports that he does not drink alcohol or use drugs.   Family History:   family history includes Arthritis in his father and mother; Depression in his father.    Review of Systems: ROS   PHYSICAL EXAM: VS:  There were no vitals taken for this visit. , BMI There is no height or weight  on file to calculate BMI. GEN: Well nourished, well developed, in no acute distress HEENT: normal Neck: no JVD, carotid bruits, or masses Cardiac: RRR; no murmurs, rubs, or gallops,no edema  Respiratory:  clear to auscultation bilaterally, normal work of breathing GI: soft, nontender, nondistended, + BS MS: no deformity or atrophy Skin: warm and dry, no rash Neuro:  Strength and sensation are intact Psych: euthymic mood, full affect    Recent Labs: 10/08/2016: TSH 1.25 05/23/2017: ALT 43 05/24/2017: BUN 17; Creatinine, Ser 1.10; Hemoglobin 13.0; Platelets 163; Potassium 3.7; Sodium 138    Lipid Panel Lab Results  Component Value Date   CHOL 164 03/24/2017   HDL 39 (L) 03/24/2017   LDLCALC 58 10/08/2016   TRIG 150 (H) 03/24/2017      Wt Readings from Last 3 Encounters:  05/23/17 150 lb (68 kg)  03/24/17 154 lb 8 oz (70.1 kg)  03/24/17 154 lb 8 oz (70.1 kg)       ASSESSMENT AND PLAN:  No diagnosis found.   Disposition:   F/U  6 months  No orders of the defined types were placed in this encounter.    Signed, Dossie Arbourim Apostolos Blagg, M.D., Ph.D. 05/24/2017  [facility] [place] [phone]

## 2017-05-24 NOTE — Progress Notes (Signed)
[facility] - [place] at [facility]   PATIENT NAME: [name] [name]    MR#:  [id number]  DATE OF BIRTH:  01-13-1928  SUBJECTIVE:    REVIEW OF SYSTEMS:   ROS Tolerating Diet: Tolerating PT:   DRUG ALLERGIES:   Allergies  Allergen Reactions  . Aspirin Anaphylaxis, Swelling and Shortness Of Breath    REACTION: terrible bp reaction/wheezing  . Bee Pollen Other (See Comments)    ragweed  . Pollen Extract     ragweed  . Rabbit Epithelium     rabbits    VITALS:  Blood pressure 123/64, pulse 64, temperature 98.1 F (36.7 C), temperature source Oral, resp. rate 18, height 5\' 3"  (1.6 m), weight 68 kg (150 lb), SpO2 94 %.  PHYSICAL EXAMINATION:   Physical Exam  GENERAL:  83 y.o.-year-old patient lying in the bed with no acute distress.  EYES: Pupils equal, round, reactive to light and accommodation. No scleral icterus. Extraocular muscles intact.  HEENT: Head atraumatic, normocephalic. Oropharynx and nasopharynx clear.  NECK:  Supple, no jugular venous distention. No thyroid enlargement, no tenderness.  LUNGS: Normal breath sounds bilaterally, no wheezing, rales, rhonchi. No use of accessory muscles of respiration.  CARDIOVASCULAR: S1, S2 normal. No murmurs, rubs, or gallops.  ABDOMEN: Soft, nontender, nondistended. Bowel sounds present. No organomegaly or mass.  EXTREMITIES: No cyanosis, clubbing or edema b/l.    NEUROLOGIC: Cranial nerves II through XII are intact. No focal Motor or sensory deficits b/l.   PSYCHIATRIC:  patient is alert and oriented x 3.  SKIN: No obvious rash, lesion, or ulcer.   LABORATORY PANEL:  CBC Recent Labs  Lab 05/24/17 0650  WBC 9.9  HGB 13.0  HCT 38.7*  PLT 163    Chemistries  Recent Labs  Lab 05/23/17 1917 05/24/17 0650  NA 138 138  K 3.8 3.7  CL 103 107  CO2 24 24  GLUCOSE 112* 101*  BUN 23* 17  CREATININE 1.15 1.10  CALCIUM 9.2 8.5*  AST 48*  --   ALT 43  --   ALKPHOS 86  --   BILITOT 0.9  --     Cardiac Enzymes No results for input(s): TROPONINI in the last 168 hours. RADIOLOGY:  Dg Pelvis 1-2 Views  Result Date: 05/23/2017 CLINICAL DATA:  Left leg pain after fall in shower 2 days ago. EXAM: PELVIS - 1-2 VIEW COMPARISON:  02/19/2010 FINDINGS: L4-5 and L5-S1 facet and degenerative disc disease. Intact bony pelvis with bilateral press fit arthroplasties of both hips. No prosthetic dislocation or evidence of hardware failure. The femoral components are cemented. Extensive bi-iliofemoral atherosclerosis. IMPRESSION: 1. Lower lumbar degenerative disc and facet arthropathy of the included lumbar spine. 2. No acute osseous abnormality of the bony pelvis. Electronically Signed   By: Tollie Ethavid  Kwon M.D.   On: 05/23/2017 18:15   Dg Tibia/fibula Left  Result Date: 05/23/2017 CLINICAL DATA:  Left leg pain after fall in shower 2 days ago. EXAM: LEFT TIBIA AND FIBULA - 2 VIEW COMPARISON:  None. FINDINGS: Tricompartmental osteoarthritis of the left knee. Intact ankle joint. No acute fracture, joint dislocation or suspicious osseous lesions. Extensive popliteal through tibial arteriosclerosis. No significant soft tissue tissue swelling. IMPRESSION: Tricompartmental osteoarthritis of the knee. No acute fracture of the tibia nor fibula. Electronically Signed   By: Tollie Ethavid  Kwon M.D.   On: 05/23/2017 18:16   Ct Angio Ao+bifem W & Or Wo Contrast  Result Date: 05/23/2017 CLINICAL DATA:  Decreased pulse in left leg. Sudden  onset of left leg pain. Patient reports recent fall in the shower. EXAM: CT ANGIOGRAPHY OF ABDOMINAL AORTA WITH ILIOFEMORAL RUNOFF TECHNIQUE: Multidetector CT imaging of the abdomen, pelvis and lower extremities was performed using the standard protocol during bolus administration of intravenous contrast. Multiplanar CT image reconstructions and MIPs were obtained to evaluate the vascular anatomy. CONTRAST:  ISOVUE-370 IOPAMIDOL (ISOVUE-370) INJECTION 76% COMPARISON:  None. FINDINGS:  VASCULAR Aorta: Moderate calcified and noncalcified atheromatous plaque without aneurysm, dissection or periaortic stranding. Scattered luminal stenosis of less than 25%. Celiac: Calcified plaque at the origin with less than 50% stenosis, mild poststenotic dilatation. SMA: Mild plaque at the origin without significant stenosis. Replaced hepatic artery is incidentally noted arising from the SMA. Renals: Plaque at the origin without significant stenosis. No evidence of vasculitis or fibromuscular dysplasia. IMA: Patent with plaque at the origin. RIGHT Lower Extremity Inflow: Common, internal and external iliac arteries are patent without evidence of aneurysm, dissection, vasculitis or significant stenosis. Mild calcified plaque. Outflow: Common, superficial and profunda femoral arteries and the popliteal artery are patent without evidence of aneurysm, dissection, vasculitis or significant stenosis. Mild calcified and noncalcified plaque in the proximal common femoral, and throughout the superficial femoral artery. Runoff: Moderate calcified plaque of the popliteal artery. Posterior tibial and peroneal arteries are patent to the mid lower leg, small in caliber. No flow in the distal peroneal artery. Densely calcified anterior tibial artery which may be occluded proximally, however reconstitutes distally at the ankle. LEFT Lower Extremity Inflow: Common, internal and external iliac arteries are patent without evidence of aneurysm, dissection, vasculitis or significant stenosis. Mild calcified plaque. Outflow: Common, superficial and profunda femoral arteries and the popliteal artery are patent without evidence of aneurysm, dissection, vasculitis or significant stenosis. Calcified and noncalcified plaques throughout the SFA, with plaque distally causing moderate stenosis. Abrupt occlusion of the distal SFA at the level of the mid distal femur extending to the popliteal artery. Curvilinear density in the periphery of the  distal most SFA may be trace reconstitution versus calcific plaque. Mild stranding about the distal SFA. Runoff: Reconstitution of the anterior tibial artery and tibioperoneal trunk with some flow to the ankle. Possible embolic disease with filling defects in the proximal posterior tibial and peroneal arteries and anterior tibial artery at the level of the ankle. Veins: No obvious venous abnormality within the limitations of this arterial phase study. Review of the MIP images confirms the above findings. NON-VASCULAR Lower chest: Linear atelectasis in the left lower lobe. The heart is normal in size. Hepatobiliary: No focal lesion allowing for arterial phase imaging. Possible small stones or sludge in the gallbladder without abnormal gallbladder distention. Pancreas: Motion artifact through the pancreas, no obvious ductal dilatation or inflammation. Spleen: Normal in size and arterial phase imaging. Splenule superiorly. Adrenals/Urinary Tract: No definite adrenal nodule. Mild thinning of renal parenchyma without hydronephrosis. Homogeneous renal enhancement. Urinary bladder is physiologically distended. Stomach/Bowel: Stomach is decompressed. No bowel dilatation or inflammation. Rather extensive colonic diverticulosis from the splenic flexure distally. No diverticulitis. Lymphatic: Small paraesophageal lymph nodes adjacent of the distal esophagus, of uncertain etiology. No additional enlarged lymph nodes in the abdomen or pelvis. Reproductive: Prostate gland obscured by streak artifact from bilateral hip prostheses. Other: No ascites.  No free air. Musculoskeletal: Bilateral hip arthroplasties. T11 compression fracture appears remote. Multilevel degenerative change in the lumbar spine. No acute osseous abnormalities in the lower extremities. IMPRESSION: VASCULAR 1. Total or near total occlusion of the distal left superficial femoral artery extending through  the popliteal. Distal reconstitution of the calf vessels  with intraluminal filling defects in the calf vessels suspicious for embolic disease. 2. Absent flow in the distal right peroneal artery, of uncertain acuity. 3. Calcified and noncalfied atheromatous plaque throughout the abdominal aorta and lower extremity vasculature, severe in bilateral trifurcations. NON-VASCULAR 1. No acute abnormality. 2. Colonic diverticulosis. Possible cholelithiasis or sludge in the gallbladder without complicating features. These results were called by telephone at the time of interpretation on 05/23/2017 at 9:22 pm to PA [facility] , who verbally acknowledged these results. Electronically Signed   By: Rubye Oaks M.D.   On: 05/23/2017 21:23   Dg Femur Min 2 Views Left  Result Date: 05/23/2017 CLINICAL DATA:  Left leg pain after fall in shower 2 days ago. EXAM: LEFT FEMUR 2 VIEWS COMPARISON:  None. FINDINGS: Tricompartmental osteoarthritis the knee. Press-Fit left hip arthroplasty with cemented femoral component. No acute fracture, suspicious osseous lesion nor joint dislocation. Slightly shallow appearing acetabular component that may predispose patient to joint dislocations. IMPRESSION: 1. No acute osseous abnormality of the left femur. 2. Tricompartmental osteoarthritis of the included left knee. 3. Intact left cemented Press-Fit arthroplasty of the hip. Electronically Signed   By: Tollie Eth M.D.   On: 05/23/2017 18:18   ASSESSMENT AND PLAN:  Francesco Provencal  is a 83 y.o. male with a known history of chronic kidney disease stage III, hyperlipidemia, hypertension, left leg DVT, myocardial infarction, pulmonary embolism, thoracic aortic atherosclerosis, osteoarthritis presented to the emergency room with left lower extremity pain. Patient lost balance and slide down in the bathtub from standing position today  1. Arterial occlusion of left superficial femoral artery extending up to popliteal artery -Patient presented with left leg severe pain along with fall and  gangrene over the left fifth toe along with cyanotic foot -CT angiogram results noted. -Continue IV heparin drip. -Awaiting vascular consultation and further recommendations  2. Hypertension -Continue atenolol.  3. Hyperlipidemia -On statin  4. Chronic kidney disease stage III    Case discussed with Care Management/Social Worker. Management plans discussed with the patient, family and they are in agreement.  CODE STATUS: Full  DVT Prophylaxis: Heparin drip  TOTAL TIME TAKING CARE OF THIS PATIENT: 25 minutes.  >50% time spent on counselling and coordination of care  POSSIBLE D/C IN *2-3 DAYS, DEPENDING ON CLINICAL CONDITION.  Note: This dictation was prepared with Dragon dictation along with smaller phrase technology. Any transcriptional errors that result from this process are unintentional.  Enedina Finner M.D on 05/24/2017 at 1:30 PM  Between 7am to 6pm - Pager - [phone]  After 6pm go to [email or web address] - Social research officer, government  [facility]  Office  [phone]  CC: Primary care physician; Kerman Passey, MD

## 2017-05-24 NOTE — Progress Notes (Signed)
ANTICOAGULATION CONSULT NOTE - Follow Up Consult  Pharmacy Consult for heparin Indication: Left and right femoral arterial occlusion  Allergies  Allergen Reactions  . Aspirin Anaphylaxis, Swelling and Shortness Of Breath    REACTION: terrible bp reaction/wheezing  . Bee Pollen Other (See Comments)    ragweed  . Pollen Extract     ragweed  . Rabbit Epithelium     rabbits    Patient Measurements: Height: 5\' 3"  (160 cm) Weight: 150 lb (68 kg) IBW/kg (Calculated) : 56.9 Heparin Dosing Weight: 68 kg  Vital Signs: Temp: 97.9 F (36.6 C) (11/24 1354) Temp Source: Oral (11/24 1354) BP: 108/52 (11/24 1354) Pulse Rate: 61 (11/24 1354)  Labs: Recent Labs    05/23/17 1917 05/23/17 1925 05/23/17 2316 05/24/17 0650 05/24/17 1722  HGB  --  14.1  --  13.0  --   HCT  --  42.8  --  38.7*  --   PLT  --  193  --  163  --   APTT  --   --  29  --   --   LABPROT  --   --  13.8  --   --   INR  --   --  1.07  --   --   HEPARINUNFRC  --   --   --  1.08* 0.42  CREATININE 1.15  --   --  1.10  --     Estimated Creatinine Clearance: 38.8 mL/min (by C-G formula based on SCr of 1.1 mg/dL).   Medical History: Past Medical History:  Diagnosis Date  . Arthritis   . CKD (chronic kidney disease) stage 3, GFR 30-59 ml/min (HCC) 12/29/2015  . Hyperlipidemia   . Hypertension   . Left leg DVT (HCC) 01/2014  . Myocardial infarction (HCC)   . Pulmonary embolism (HCC) 01/2014  . Thoracic aorta atherosclerosis (HCC) 10/08/2016    Medications:  Scheduled:  . atenolol  50 mg Oral Daily  . cholecalciferol  5,000 Units Oral Daily  . donepezil  5 mg Oral QPM  . omega-3 acid ethyl esters  1 g Oral Daily  . pneumococcal 23 valent vaccine  0.5 mL Intramuscular Tomorrow-1000  . rosuvastatin  10 mg Oral Daily  . sodium chloride flush  3 mL Intravenous Q12H  . tiZANidine  2 mg Oral Q6H  . vitamin B-12  1,000 mcg Oral Daily    Assessment: Patient admitted s/t to fall in bathtub resulting in painful  lower extremities and difficulty walking. Patient has a h/o of bilateral hip replacements and on CT shown to have a total or near total occlusion of the left femoral artery and absent flow in the right peroneal artery. Patient does not appear to be on any PTA anticoagulation. Heparin drip is being started for femoral occlusion  Goal of Therapy:  Heparin level 0.3-0.7 units/ml Monitor platelets by anticoagulation protocol: Yes   Plan:  Heparin level resulted @ 1.06. Will hold heparin gtt for 1 hour then will reduce heparin gtt rate to 900 unit/hr. Will then recheck Heparin level @ 1700.   11/24: HL @ 17:22 = 0.42 Will continue this pt on current rate and recheck HL in 8 hrs on 11/25 @ 0100.    Ronnetta Currington D   05/24/2017

## 2017-05-24 NOTE — Progress Notes (Signed)
    Subjective  -   Minimal change overnight   Physical Exam:  Left 5th toe with areas of ischemia and eschar. Decreased amount of erythema surrounding left 5th toe Palpable femoral pulses       Assessment/Plan:    Continue IV abx Not sure if heparin drip is adding anything, but would continue Discussed plans for angiogram with intervention on Monday  Wells Cinthya Bors 05/24/2017 4:15 PM --  Vitals:   05/24/17 0526 05/24/17 1354  BP: 123/64 (!) 108/52  Pulse: 64 61  Resp: 18 20  Temp: 98.1 F (36.7 C) 97.9 F (36.6 C)  SpO2: 94% 97%    Intake/Output Summary (Last 24 hours) at 05/24/2017 1615 Last data filed at 05/24/2017 1522 Gross per 24 hour  Intake 429 ml  Output 550 ml  Net -121 ml     Laboratory CBC    Component Value Date/Time   WBC 9.9 05/24/2017 0650   HGB 13.0 05/24/2017 0650   HGB 11.5 (L) 03/13/2015 0906   HCT 38.7 (L) 05/24/2017 0650   HCT 36.5 (L) 03/13/2015 0906   PLT 163 05/24/2017 0650   PLT 241 03/13/2015 0906    BMET    Component Value Date/Time   NA 138 05/24/2017 0650   NA 141 03/13/2015 0906   NA 137 02/07/2014 0402   K 3.7 05/24/2017 0650   K 4.3 02/07/2014 0402   CL 107 05/24/2017 0650   CL 103 02/07/2014 0402   CO2 24 05/24/2017 0650   CO2 22 02/07/2014 0402   GLUCOSE 101 (H) 05/24/2017 0650   GLUCOSE 104 (H) 02/06/2014 0620   BUN 17 05/24/2017 0650   BUN 27 03/13/2015 0906   BUN 20 (H) 02/06/2014 0620   CREATININE 1.10 05/24/2017 0650   CREATININE 1.37 (H) 03/24/2017 1112   CALCIUM 8.5 (L) 05/24/2017 0650   CALCIUM 8.1 (L) 02/06/2014 0620   GFRNONAA 59 (L) 05/24/2017 0650   GFRNONAA 46 (L) 03/24/2017 1112   GFRAA >60 05/24/2017 0650   GFRAA 54 (L) 03/24/2017 1112    COAG Lab Results  Component Value Date   INR 1.07 05/23/2017   INR 2.6 02/09/2014   INR 2.0 02/08/2014   No results found for: PTT  Antibiotics Anti-infectives (From admission, onward)   Start     Dose/Rate Route Frequency Ordered Stop    05/23/17 2300  piperacillin-tazobactam (ZOSYN) IVPB 3.375 g  Status:  Discontinued     3.375 g 12.5 mL/hr over 240 Minutes Intravenous Every 8 hours 05/23/17 2255 05/24/17 1530       V. Charlena CrossWells Reegan Mctighe IV, M.D. [facility] Office: [phone] Pager:  [phone]

## 2017-05-25 LAB — CBC
HCT: 42.3 % (ref 40.0–52.0)
HEMOGLOBIN: 14.1 g/dL (ref 13.0–18.0)
MCH: 30.7 pg (ref 26.0–34.0)
MCHC: 33.2 g/dL (ref 32.0–36.0)
MCV: 92.6 fL (ref 80.0–100.0)
Platelets: 174 10*3/uL (ref 150–440)
RBC: 4.57 MIL/uL (ref 4.40–5.90)
RDW: 14.4 % (ref 11.5–14.5)
WBC: 9.2 10*3/uL (ref 3.8–10.6)

## 2017-05-25 LAB — HEPARIN LEVEL (UNFRACTIONATED): Heparin Unfractionated: 0.41 [IU]/mL (ref 0.30–0.70)

## 2017-05-25 LAB — SURGICAL PCR SCREEN
MRSA, PCR: NEGATIVE
Staphylococcus aureus: NEGATIVE

## 2017-05-25 MED ORDER — OXYCODONE-ACETAMINOPHEN 5-325 MG PO TABS
1.0000 | ORAL_TABLET | ORAL | Status: DC | PRN
  Administered 2017-05-27: 1 via ORAL
  Administered 2017-05-27 – 2017-05-28 (×2): 2 via ORAL
  Filled 2017-05-25: qty 1
  Filled 2017-05-25 (×2): qty 2

## 2017-05-25 MED ORDER — AMOXICILLIN-POT CLAVULANATE 875-125 MG PO TABS
1.0000 | ORAL_TABLET | Freq: Two times a day (BID) | ORAL | Status: DC
  Administered 2017-05-25 – 2017-05-27 (×5): 1 via ORAL
  Filled 2017-05-25 (×5): qty 1

## 2017-05-25 NOTE — Progress Notes (Signed)
ANTICOAGULATION CONSULT NOTE - Follow Up Consult  Pharmacy Consult for heparin Indication: Left and right femoral arterial occlusion  Allergies  Allergen Reactions  . Aspirin Anaphylaxis, Swelling and Shortness Of Breath    REACTION: terrible bp reaction/wheezing  . Bee Pollen Other (See Comments)    ragweed  . Pollen Extract     ragweed  . Rabbit Epithelium     rabbits    Patient Measurements: Height: 5\' 3"  (160 cm) Weight: 150 lb (68 kg) IBW/kg (Calculated) : 56.9 Heparin Dosing Weight: 68 kg  Vital Signs: Temp: 98.3 F (36.8 C) (11/24 2058) Temp Source: Oral (11/24 40982058) BP: 152/57 (11/24 2058) Pulse Rate: 63 (11/24 2058)  Labs: Recent Labs    05/23/17 1917 05/23/17 1925 05/23/17 2316 05/24/17 0650 05/24/17 1722 05/25/17 0107  HGB  --  14.1  --  13.0  --   --   HCT  --  42.8  --  38.7*  --   --   PLT  --  193  --  163  --   --   APTT  --   --  29  --   --   --   LABPROT  --   --  13.8  --   --   --   INR  --   --  1.07  --   --   --   HEPARINUNFRC  --   --   --  1.08* 0.42 0.41  CREATININE 1.15  --   --  1.10  --   --     Estimated Creatinine Clearance: 38.8 mL/min (by C-G formula based on SCr of 1.1 mg/dL).   Medical History: Past Medical History:  Diagnosis Date  . Arthritis   . CKD (chronic kidney disease) stage 3, GFR 30-59 ml/min (HCC) 12/29/2015  . Hyperlipidemia   . Hypertension   . Left leg DVT (HCC) 01/2014  . Myocardial infarction (HCC)   . Pulmonary embolism (HCC) 01/2014  . Thoracic aorta atherosclerosis (HCC) 10/08/2016    Medications:  Scheduled:  . atenolol  50 mg Oral Daily  . cholecalciferol  5,000 Units Oral Daily  . donepezil  5 mg Oral QPM  . omega-3 acid ethyl esters  1 g Oral Daily  . pneumococcal 23 valent vaccine  0.5 mL Intramuscular Tomorrow-1000  . rosuvastatin  10 mg Oral Daily  . sodium chloride flush  3 mL Intravenous Q12H  . tiZANidine  2 mg Oral Q6H  . vitamin B-12  1,000 mcg Oral Daily     Assessment: Patient admitted s/t to fall in bathtub resulting in painful lower extremities and difficulty walking. Patient has a h/o of bilateral hip replacements and on CT shown to have a total or near total occlusion of the left femoral artery and absent flow in the right peroneal artery. Patient does not appear to be on any PTA anticoagulation. Heparin drip is being started for femoral occlusion  Goal of Therapy:  Heparin level 0.3-0.7 units/ml Monitor platelets by anticoagulation protocol: Yes   Plan:  Heparin level resulted @ 1.06. Will hold heparin gtt for 1 hour then will reduce heparin gtt rate to 900 unit/hr. Will then recheck Heparin level @ 1700.   11/24: HL @ 17:22 = 0.42 Will continue this pt on current rate and recheck HL in 8 hrs on 11/25 @ 0100.   11/24 @ 0100 HL 0.41 therapeutic. Will continue current rate and will recheck w/ am labs. Will check CBC w/ am labs.  Thomasene Rippleavid Derry Kassel, PharmD, BCPS Clinical Pharmacist 05/25/2017

## 2017-05-25 NOTE — Progress Notes (Signed)
[facility] - [facility] at [facility]   PATIENT NAME: [name] [name]    MR#:  [id number]  DATE OF BIRTH:  09-20-1927  SUBJECTIVE:  Left foot pain Poor appetite  REVIEW OF SYSTEMS:   Review of Systems  Constitutional: Negative for chills, fever and weight loss.  HENT: Negative for ear discharge, ear pain and nosebleeds.   Eyes: Negative for blurred vision, pain and discharge.  Respiratory: Negative for sputum production, shortness of breath, wheezing and stridor.   Cardiovascular: Negative for chest pain, palpitations, orthopnea and PND.  Gastrointestinal: Negative for abdominal pain, diarrhea, nausea and vomiting.  Genitourinary: Negative for frequency and urgency.  Musculoskeletal: Positive for joint pain. Negative for back pain.       Left foot pain  Neurological: Positive for weakness. Negative for sensory change, speech change and focal weakness.  Psychiatric/Behavioral: Negative for depression and hallucinations. The patient is not nervous/anxious.    Tolerating Diet:little Tolerating PT: pending  DRUG ALLERGIES:   Allergies  Allergen Reactions  . Aspirin Anaphylaxis, Swelling and Shortness Of Breath    REACTION: terrible bp reaction/wheezing  . Bee Pollen Other (See Comments)    ragweed  . Pollen Extract     ragweed  . Rabbit Epithelium     rabbits    VITALS:  Blood pressure (!) 154/65, pulse 64, temperature 97.7 F (36.5 C), temperature source Oral, resp. rate 18, height 5\' 3"  (1.6 m), weight 68 kg (150 lb), SpO2 100 %.  PHYSICAL EXAMINATION:   Physical Exam  GENERAL:  83 y.o.-year-old patient lying in the bed with no acute distress.  EYES: Pupils equal, round, reactive to light and accommodation. No scleral icterus. Extraocular muscles intact.  HEENT: Head atraumatic, normocephalic. Oropharynx and nasopharynx clear.  NECK:  Supple, no jugular venous distention. No thyroid enlargement, no tenderness.  LUNGS: Normal breath sounds  bilaterally, no wheezing, rales, rhonchi. No use of accessory muscles of respiration.  CARDIOVASCULAR: S1, S2 normal. No murmurs, rubs, or gallops.  ABDOMEN: Soft, nontender, nondistended. Bowel sounds present. No organomegaly or mass.  EXTREMITIES: No cyanosis, clubbing or edema b/l.   Left 5th toe gangrene NEUROLOGIC: Cranial nerves II through XII are intact. No focal Motor or sensory deficits b/l.   PSYCHIATRIC:  patient is alert and oriented x 3.  SKIN: No obvious rash, lesion, or ulcer.   LABORATORY PANEL:  CBC Recent Labs  Lab 05/25/17 0449  WBC 9.2  HGB 14.1  HCT 42.3  PLT 174    Chemistries  Recent Labs  Lab 05/23/17 1917 05/24/17 0650  NA 138 138  K 3.8 3.7  CL 103 107  CO2 24 24  GLUCOSE 112* 101*  BUN 23* 17  CREATININE 1.15 1.10  CALCIUM 9.2 8.5*  AST 48*  --   ALT 43  --   ALKPHOS 86  --   BILITOT 0.9  --    Cardiac Enzymes No results for input(s): TROPONINI in the last 168 hours. RADIOLOGY:  Dg Pelvis 1-2 Views  Result Date: 05/23/2017 CLINICAL DATA:  Left leg pain after fall in shower 2 days ago. EXAM: PELVIS - 1-2 VIEW COMPARISON:  02/19/2010 FINDINGS: L4-5 and L5-S1 facet and degenerative disc disease. Intact bony pelvis with bilateral press fit arthroplasties of both hips. No prosthetic dislocation or evidence of hardware failure. The femoral components are cemented. Extensive bi-iliofemoral atherosclerosis. IMPRESSION: 1. Lower lumbar degenerative disc and facet arthropathy of the included lumbar spine. 2. No acute osseous abnormality of the bony pelvis. Electronically  Signed   By: David  KwTollie Ethon M.D.   On: 05/23/2017 18:15   Dg Tibia/fibula Left  Result Date: 05/23/2017 CLINICAL DATA:  Left leg pain after fall in shower 2 days ago. EXAM: LEFT TIBIA AND FIBULA - 2 VIEW COMPARISON:  None. FINDINGS: Tricompartmental osteoarthritis of the left knee. Intact ankle joint. No acute fracture, joint dislocation or suspicious osseous lesions. Extensive popliteal  through tibial arteriosclerosis. No significant soft tissue tissue swelling. IMPRESSION: Tricompartmental osteoarthritis of the knee. No acute fracture of the tibia nor fibula. Electronically Signed   By: Tollie Ethavid  Kwon M.D.   On: 05/23/2017 18:16   Ct Angio Ao+bifem W & Or Wo Contrast  Result Date: 05/23/2017 CLINICAL DATA:  Decreased pulse in left leg. Sudden onset of left leg pain. Patient reports recent fall in the shower. EXAM: CT ANGIOGRAPHY OF ABDOMINAL AORTA WITH ILIOFEMORAL RUNOFF TECHNIQUE: Multidetector CT imaging of the abdomen, pelvis and lower extremities was performed using the standard protocol during bolus administration of intravenous contrast. Multiplanar CT image reconstructions and MIPs were obtained to evaluate the vascular anatomy. CONTRAST:  125mL ISOVUE-370 IOPAMIDOL (ISOVUE-370) INJECTION 76% COMPARISON:  None. FINDINGS: VASCULAR Aorta: Moderate calcified and noncalcified atheromatous plaque without aneurysm, dissection or periaortic stranding. Scattered luminal stenosis of less than 25%. Celiac: Calcified plaque at the origin with less than 50% stenosis, mild poststenotic dilatation. SMA: Mild plaque at the origin without significant stenosis. Replaced hepatic artery is incidentally noted arising from the SMA. Renals: Plaque at the origin without significant stenosis. No evidence of vasculitis or fibromuscular dysplasia. IMA: Patent with plaque at the origin. RIGHT Lower Extremity Inflow: Common, internal and external iliac arteries are patent without evidence of aneurysm, dissection, vasculitis or significant stenosis. Mild calcified plaque. Outflow: Common, superficial and profunda femoral arteries and the popliteal artery are patent without evidence of aneurysm, dissection, vasculitis or significant stenosis. Mild calcified and noncalcified plaque in the proximal common femoral, and throughout the superficial femoral artery. Runoff: Moderate calcified plaque of the popliteal artery.  Posterior tibial and peroneal arteries are patent to the mid lower leg, small in caliber. No flow in the distal peroneal artery. Densely calcified anterior tibial artery which may be occluded proximally, however reconstitutes distally at the ankle. LEFT Lower Extremity Inflow: Common, internal and external iliac arteries are patent without evidence of aneurysm, dissection, vasculitis or significant stenosis. Mild calcified plaque. Outflow: Common, superficial and profunda femoral arteries and the popliteal artery are patent without evidence of aneurysm, dissection, vasculitis or significant stenosis. Calcified and noncalcified plaques throughout the SFA, with plaque distally causing moderate stenosis. Abrupt occlusion of the distal SFA at the level of the mid distal femur extending to the popliteal artery. Curvilinear density in the periphery of the distal most SFA may be trace reconstitution versus calcific plaque. Mild stranding about the distal SFA. Runoff: Reconstitution of the anterior tibial artery and tibioperoneal trunk with some flow to the ankle. Possible embolic disease with filling defects in the proximal posterior tibial and peroneal arteries and anterior tibial artery at the level of the ankle. Veins: No obvious venous abnormality within the limitations of this arterial phase study. Review of the MIP images confirms the above findings. NON-VASCULAR Lower chest: Linear atelectasis in the left lower lobe. The heart is normal in size. Hepatobiliary: No focal lesion allowing for arterial phase imaging. Possible small stones or sludge in the gallbladder without abnormal gallbladder distention. Pancreas: Motion artifact through the pancreas, no obvious ductal dilatation or inflammation. Spleen: Normal in size and arterial  phase imaging. Splenule superiorly. Adrenals/Urinary Tract: No definite adrenal nodule. Mild thinning of renal parenchyma without hydronephrosis. Homogeneous renal enhancement. Urinary  bladder is physiologically distended. Stomach/Bowel: Stomach is decompressed. No bowel dilatation or inflammation. Rather extensive colonic diverticulosis from the splenic flexure distally. No diverticulitis. Lymphatic: Small paraesophageal lymph nodes adjacent of the distal esophagus, of uncertain etiology. No additional enlarged lymph nodes in the abdomen or pelvis. Reproductive: Prostate gland obscured by streak artifact from bilateral hip prostheses. Other: No ascites.  No free air. Musculoskeletal: Bilateral hip arthroplasties. T11 compression fracture appears remote. Multilevel degenerative change in the lumbar spine. No acute osseous abnormalities in the lower extremities. IMPRESSION: VASCULAR 1. Total or near total occlusion of the distal left superficial femoral artery extending through the popliteal. Distal reconstitution of the calf vessels with intraluminal filling defects in the calf vessels suspicious for embolic disease. 2. Absent flow in the distal right peroneal artery, of uncertain acuity. 3. Calcified and noncalfied atheromatous plaque throughout the abdominal aorta and lower extremity vasculature, severe in bilateral trifurcations. NON-VASCULAR 1. No acute abnormality. 2. Colonic diverticulosis. Possible cholelithiasis or sludge in the gallbladder without complicating features. These results were called by telephone at the time of interpretation on 05/23/2017 at 9:22 pm to PA [facility] , who verbally acknowledged these results. Electronically Signed   By: Rubye Oaks M.D.   On: 05/23/2017 21:23   Dg Femur Min 2 Views Left  Result Date: 05/23/2017 CLINICAL DATA:  Left leg pain after fall in shower 2 days ago. EXAM: LEFT FEMUR 2 VIEWS COMPARISON:  None. FINDINGS: Tricompartmental osteoarthritis the knee. Press-Fit left hip arthroplasty with cemented femoral component. No acute fracture, suspicious osseous lesion nor joint dislocation. Slightly shallow appearing acetabular component  that may predispose patient to joint dislocations. IMPRESSION: 1. No acute osseous abnormality of the left femur. 2. Tricompartmental osteoarthritis of the included left knee. 3. Intact left cemented Press-Fit arthroplasty of the hip. Electronically Signed   By: Tollie Eth M.D.   On: 05/23/2017 18:18   ASSESSMENT AND PLAN:  [name] [name]  is a 83 y.o. male with a known history of chronic kidney disease stage III, hyperlipidemia, hypertension, left leg DVT, myocardial infarction, pulmonary embolism, thoracic aortic atherosclerosis, osteoarthritis presented to the emergency room with left lower extremity pain. Patient lost balance and slide down in the bathtub from standing position today  1. 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 -Continue IV heparin drip. - vascular consultation appreciated. Recommends further w/u tomorrow  2. Hypertension -Continue atenolol.  3. Hyperlipidemia -On statin  4. Chronic kidney disease stage III    Case discussed with Care Management/Social Worker. Management plans discussed with the patient, family and they are in agreement.  CODE STATUS: Full  DVT Prophylaxis: Heparin drip  TOTAL TIME TAKING CARE OF THIS PATIENT: 25 minutes.  >50% time spent on counselling and coordination of care  POSSIBLE D/C IN *2-3 DAYS, DEPENDING ON CLINICAL CONDITION.  Note: This dictation was prepared with Dragon dictation along with smaller phrase technology. Any transcriptional errors that result from this process are unintentional.  Enedina Finner M.D on 05/25/2017 at 10:27 AM  Between 7am to 6pm - Pager - [phone]  After 6pm go to [email or web address] - Social research officer, government  [employer]  [phone]  CC: Primary care  physician; Kerman PasseyLada, Melinda P, MD

## 2017-05-26 ENCOUNTER — Ambulatory Visit: Payer: Commercial Managed Care - HMO | Admitting: Cardiovascular Disease

## 2017-05-26 ENCOUNTER — Encounter
Admission: EM | Disposition: A | Discharge status: Skilled Nursing Facility | Payer: Self-pay | Source: Home / Self Care | Attending: Internal Medicine

## 2017-05-26 ENCOUNTER — Encounter: Payer: Self-pay | Admitting: Emergency Medicine

## 2017-05-26 DIAGNOSIS — I70262 Atherosclerosis of native arteries of extremities with gangrene, left leg: Secondary | ICD-10-CM

## 2017-05-26 HISTORY — PX: LOWER EXTREMITY ANGIOGRAPHY: CATH118251

## 2017-05-26 LAB — HEPARIN LEVEL (UNFRACTIONATED)
Heparin Unfractionated: 0.29 IU/mL — ABNORMAL LOW (ref 0.30–0.70)
Heparin Unfractionated: 0.46 IU/mL (ref 0.30–0.70)

## 2017-05-26 LAB — CBC
HCT: 42.8 % (ref 40.0–52.0)
Hemoglobin: 14.4 g/dL (ref 13.0–18.0)
MCH: 31 pg (ref 26.0–34.0)
MCHC: 33.7 g/dL (ref 32.0–36.0)
MCV: 92 fL (ref 80.0–100.0)
Platelets: 175 10*3/uL (ref 150–440)
RBC: 4.65 MIL/uL (ref 4.40–5.90)
RDW: 14.2 % (ref 11.5–14.5)
WBC: 8.6 10*3/uL (ref 3.8–10.6)

## 2017-05-26 LAB — BASIC METABOLIC PANEL
ANION GAP: 14 (ref 5–15)
BUN: 18 mg/dL (ref 6–20)
CO2: 20 mmol/L — ABNORMAL LOW (ref 22–32)
Calcium: 9 mg/dL (ref 8.9–10.3)
Chloride: 101 mmol/L (ref 101–111)
Creatinine, Ser: 1.19 mg/dL (ref 0.61–1.24)
GFR calc Af Amer: >60 mL/min (ref >60)
GFR, EST NON AFRICAN AMERICAN: 53 mL/min — AB (ref >60)
Glucose, Bld: 76 mg/dL (ref 65–99)
POTASSIUM: 3.9 mmol/L (ref 3.5–5.1)
SODIUM: 135 mmol/L (ref 135–145)

## 2017-05-26 LAB — PROTIME-INR
INR: 1.03
Prothrombin Time: 13.4 seconds (ref 11.4–15.2)

## 2017-05-26 SURGERY — LOWER EXTREMITY ANGIOGRAPHY
Anesthesia: Moderate Sedation

## 2017-05-26 MED ORDER — HEPARIN SODIUM (PORCINE) 1000 UNIT/ML IJ SOLN
INTRAMUSCULAR | Status: AC
  Filled 2017-05-26: qty 1

## 2017-05-26 MED ORDER — ALTEPLASE 2 MG IJ SOLR
INTRAMUSCULAR | Status: AC
  Filled 2017-05-26: qty 6

## 2017-05-26 MED ORDER — DEXTROSE 5 % IV SOLN
1.5000 g | INTRAVENOUS | Status: DC
  Filled 2017-05-26: qty 1.5

## 2017-05-26 MED ORDER — MIDAZOLAM HCL 2 MG/2ML IJ SOLN
INTRAMUSCULAR | Status: DC | PRN
  Administered 2017-05-26: 1 mg via INTRAVENOUS
  Administered 2017-05-26: 2 mg via INTRAVENOUS

## 2017-05-26 MED ORDER — FENTANYL CITRATE (PF) 100 MCG/2ML IJ SOLN
INTRAMUSCULAR | Status: AC
  Filled 2017-05-26: qty 2

## 2017-05-26 MED ORDER — MIDAZOLAM HCL 5 MG/5ML IJ SOLN
INTRAMUSCULAR | Status: AC
  Filled 2017-05-26: qty 5

## 2017-05-26 MED ORDER — SODIUM CHLORIDE 0.9 % IV SOLN
INTRAVENOUS | Status: DC
  Administered 2017-05-26: 13:00:00 via INTRAVENOUS

## 2017-05-26 MED ORDER — DIPHENHYDRAMINE HCL 50 MG/ML IJ SOLN
INTRAMUSCULAR | Status: DC | PRN
  Administered 2017-05-26: 25 mg via INTRAVENOUS

## 2017-05-26 MED ORDER — FENTANYL CITRATE (PF) 100 MCG/2ML IJ SOLN
INTRAMUSCULAR | Status: DC | PRN
  Administered 2017-05-26 (×2): 25 ug via INTRAVENOUS
  Administered 2017-05-26: 50 ug via INTRAVENOUS

## 2017-05-26 MED ORDER — ALTEPLASE 2 MG IJ SOLR
INTRAMUSCULAR | Status: DC | PRN
  Administered 2017-05-26: 8 mg

## 2017-05-26 MED ORDER — ALTEPLASE 2 MG IJ SOLR
INTRAMUSCULAR | Status: AC
  Filled 2017-05-26: qty 2

## 2017-05-26 MED ORDER — LIDOCAINE-EPINEPHRINE (PF) 1 %-1:200000 IJ SOLN
INTRAMUSCULAR | Status: AC
  Filled 2017-05-26: qty 30

## 2017-05-26 MED ORDER — HEPARIN (PORCINE) IN NACL 2-0.9 UNIT/ML-% IJ SOLN
INTRAMUSCULAR | Status: AC
  Filled 2017-05-26: qty 1000

## 2017-05-26 MED ORDER — HEPARIN SODIUM (PORCINE) 1000 UNIT/ML IJ SOLN
INTRAMUSCULAR | Status: DC | PRN
  Administered 2017-05-26: 5000 [IU] via INTRAVENOUS

## 2017-05-26 MED ORDER — DIPHENHYDRAMINE HCL 50 MG/ML IJ SOLN
INTRAMUSCULAR | Status: AC
  Filled 2017-05-26: qty 1

## 2017-05-26 MED ORDER — IOPAMIDOL (ISOVUE-300) INJECTION 61%
INTRAVENOUS | Status: DC | PRN
  Administered 2017-05-26: 50 mL via INTRA_ARTERIAL

## 2017-05-26 MED ORDER — HEPARIN BOLUS VIA INFUSION
1000.0000 [IU] | Freq: Once | INTRAVENOUS | Status: AC
  Administered 2017-05-26: 1000 [IU] via INTRAVENOUS
  Filled 2017-05-26: qty 1000

## 2017-05-26 SURGICAL SUPPLY — 25 items
BALLN ULTRVRSE  5X150X130C (BALLOONS) ×2
BALLN ULTRVRSE 018 2.5X100X150 (BALLOONS) ×3
BALLN ULTRVRSE 3X300X150 (BALLOONS) ×2
BALLN ULTRVRSE 3X300X150 OTW (BALLOONS) ×1
BALLN ULTRVRSE 5X150X130C (BALLOONS) ×1
BALLOON ULTRVRSE 3X300X150 OTW (BALLOONS) ×1 IMPLANT
BALLOON ULTRVRSE 5X150X130C (BALLOONS) ×1 IMPLANT
BALLOON ULTRVS 018 2.5X100X150 (BALLOONS) ×1 IMPLANT
CANISTER PENUMBRA MAX (MISCELLANEOUS) ×2 IMPLANT
CATH CXI SUPP ANG 4FR 135 (MICROCATHETER) ×1 IMPLANT
CATH CXI SUPP ANG 4FR 135CM (MICROCATHETER) ×3
CATH INDIGO 6 ST-TIP 135CM (CATHETERS) ×3 IMPLANT
CATH PIG 70CM (CATHETERS) ×3 IMPLANT
CATH VERT 5FR 125CM (CATHETERS) ×3 IMPLANT
DEVICE PRESTO INFLATION (MISCELLANEOUS) ×3 IMPLANT
DEVICE STARCLOSE SE CLOSURE (Vascular Products) ×3 IMPLANT
GLIDEWIRE ADV .035X260CM (WIRE) ×6 IMPLANT
PACK ANGIOGRAPHY (CUSTOM PROCEDURE TRAY) ×3 IMPLANT
SHEATH BRITE TIP 5FRX11 (SHEATH) ×3 IMPLANT
SHEATH GUIDING CAROTID 6FRX90 (SHEATH) ×3 IMPLANT
STENT VIABAHN 6X150X120 (Permanent Stent) ×3 IMPLANT
TUBING ASPIRATION INDIGO (MISCELLANEOUS) ×2 IMPLANT
TUBING CONTRAST HIGH PRESS 72 (TUBING) ×3 IMPLANT
WIRE G V18X300CM (WIRE) ×3 IMPLANT
WIRE J 3MM .035X145CM (WIRE) ×3 IMPLANT

## 2017-05-26 NOTE — Progress Notes (Signed)
Found patient had pulled out IV and removed PAD to Right groin; No hematoma noted at this time; Conform roll/pressure dressing applied with foam tape, will continue to monitor; Patient A/O X4; stated that he just needed to get up; reinforced need to call for assistance and not to pull anything out;  IV replaced; Dr. Gilda CreaseSchnier, Vascular on-call notified; acknowledged; no new orders. Windy Carinaurner,Egan Berkheimer K, RN 10:10 PM; 05/26/2017

## 2017-05-26 NOTE — H&P (Signed)
[facility] History & Physical Update  The patient was interviewed and re-examined.  [name] previous History and Physical has been reviewed and is unchanged.  There is no change in the plan of care. We plan to proceed with the scheduled procedure.  Festus BarrenJason Dew, MD  05/26/2017, 1:47 PM

## 2017-05-26 NOTE — Care Management Important Message (Signed)
Important Message  Patient Details  Name: [name] [name] MRN: [id number] Date of Birth: Aug 10, 1927   Medicare Important Message Given:  Yes    Chapman FitchBOWEN, Keiton Cosma T, RN 05/26/2017, 4:26 PM

## 2017-05-26 NOTE — Progress Notes (Signed)
ANTICOAGULATION CONSULT NOTE - Follow Up Consult  Pharmacy Consult for heparin Indication: Left and right femoral arterial occlusion  Allergies  Allergen Reactions  . Aspirin Anaphylaxis, Swelling and Shortness Of Breath    REACTION: terrible bp reaction/wheezing  . Bee Pollen Other (See Comments)    ragweed  . Pollen Extract     ragweed  . Rabbit Epithelium     rabbits    Patient Measurements: Height: 5\' 3"  (160 cm) Weight: 150 lb (68 kg) IBW/kg (Calculated) : 56.9 Heparin Dosing Weight: 68 kg  Vital Signs: Temp: 98.6 F (37 C) (11/26 0441) Temp Source: Oral (11/26 0441) BP: 153/60 (11/26 0445) Pulse Rate: 68 (11/26 0445)  Labs: Recent Labs    05/23/17 1917  05/23/17 2316  05/24/17 0650 05/24/17 1722 05/25/17 0107 05/25/17 0449 05/26/17 0459  HGB  --    < >  --   --  13.0  --   --  14.1 14.4  HCT  --    < >  --   --  38.7*  --   --  42.3 42.8  PLT  --    < >  --   --  163  --   --  174 175  APTT  --   --  29  --   --   --   --   --   --   LABPROT  --   --  13.8  --   --   --   --   --  13.4  INR  --   --  1.07  --   --   --   --   --  1.03  HEPARINUNFRC  --   --   --    < > 1.08* 0.42 0.41  --  0.29*  CREATININE 1.15  --   --   --  1.10  --   --   --  1.19   < > = values in this interval not displayed.    Estimated Creatinine Clearance: 35.9 mL/min (by C-G formula based on SCr of 1.19 mg/dL).   Medical History: Past Medical History:  Diagnosis Date  . Arthritis   . CKD (chronic kidney disease) stage 3, GFR 30-59 ml/min (HCC) 12/29/2015  . Hyperlipidemia   . Hypertension   . Left leg DVT (HCC) 01/2014  . Myocardial infarction (HCC)   . Pulmonary embolism (HCC) 01/2014  . Thoracic aorta atherosclerosis (HCC) 10/08/2016    Medications:  Scheduled:  . amoxicillin-clavulanate  1 tablet Oral Q12H  . atenolol  50 mg Oral Daily  . cholecalciferol  5,000 Units Oral Daily  . donepezil  5 mg Oral QPM  . heparin  1,000 Units Intravenous Once  . omega-3 acid  ethyl esters  1 g Oral Daily  . rosuvastatin  10 mg Oral Daily  . sodium chloride flush  3 mL Intravenous Q12H  . tiZANidine  2 mg Oral Q6H  . vitamin B-12  1,000 mcg Oral Daily    Assessment: Patient admitted s/t to fall in bathtub resulting in painful lower extremities and difficulty walking. Patient has a h/o of bilateral hip replacements and on CT shown to have a total or near total occlusion of the left femoral artery and absent flow in the right peroneal artery. Patient does not appear to be on any PTA anticoagulation. Heparin drip is being started for femoral occlusion  Goal of Therapy:  Heparin level 0.3-0.7 units/ml Monitor platelets by anticoagulation protocol:  Yes   Plan:  Heparin level resulted @ 1.06. Will hold heparin gtt for 1 hour then will reduce heparin gtt rate to 900 unit/hr. Will then recheck Heparin level @ 1700.   11/24: HL @ 17:22 = 0.42 Will continue this pt on current rate and recheck HL in 8 hrs on 11/25 @ 0100.   11/24 @ 0100 HL 0.41 therapeutic. Will continue current rate and will recheck w/ am labs. Will check CBC w/ am labs.  11/26 @ 0500 HL 0.29 subtherapeutic. Will rebolus w/ heparin 1000 units IV x 1 and will increase rate to 1000 units/hr and will recheck anti-Xa @ 1300.   Thomasene Rippleavid Garvey Westcott, PharmD, BCPS Clinical Pharmacist 05/26/2017

## 2017-05-26 NOTE — Progress Notes (Signed)
[facility] - [place] [facility]   PATIENT NAME: [name] [name]    MR#:  [id number]  DATE OF BIRTH:  1928/05/19  SUBJECTIVE:  Left foot pain Poor appetite  REVIEW OF SYSTEMS:   Review of Systems  Constitutional: Negative for chills, fever and weight loss.  HENT: Negative for ear discharge, ear pain and nosebleeds.   Eyes: Negative for blurred vision, pain and discharge.  Respiratory: Negative for sputum production, shortness of breath, wheezing and stridor.   Cardiovascular: Negative for chest pain, palpitations, orthopnea and PND.  Gastrointestinal: Negative for abdominal pain, diarrhea, nausea and vomiting.  Genitourinary: Negative for frequency and urgency.  Musculoskeletal: Positive for joint pain. Negative for back pain.       Left foot pain  Neurological: Positive for weakness. Negative for sensory change, speech change and focal weakness.  Psychiatric/Behavioral: Negative for depression and hallucinations. The patient is not nervous/anxious.    Tolerating Diet:little Tolerating PT: pending  DRUG ALLERGIES:   Allergies  Allergen Reactions  . Aspirin Anaphylaxis, Swelling and Shortness Of Breath    REACTION: terrible bp reaction/wheezing  . Bee Pollen Other (See Comments)    ragweed  . Pollen Extract     ragweed  . Rabbit Epithelium     rabbits    VITALS:  Blood pressure (!) 153/60, pulse 68, temperature 98.6 F (37 C), temperature source Oral, resp. rate 20, height 5\' 3"  (1.6 m), weight 68 kg (150 lb), SpO2 98 %.  PHYSICAL EXAMINATION:   Physical Exam  GENERAL:  83 y.o.-year-old patient lying in the bed with no acute distress.  EYES: Pupils equal, round, reactive to light and accommodation. No scleral icterus. Extraocular muscles intact.  HEENT: Head atraumatic, normocephalic. Oropharynx and nasopharynx clear.  NECK:  Supple, no jugular venous distention. No thyroid enlargement, no tenderness.  LUNGS: Normal breath sounds  bilaterally, no wheezing, rales, rhonchi. No use of accessory muscles of respiration.  CARDIOVASCULAR: S1, S2 normal. No murmurs, rubs, or gallops.  ABDOMEN: Soft, nontender, nondistended. Bowel sounds present. No organomegaly or mass.  EXTREMITIES: No cyanosis, clubbing or edema b/l.   Left 5th toe gangrene NEUROLOGIC: Cranial nerves II through XII are intact. No focal Motor or sensory deficits b/l.   PSYCHIATRIC:  patient is alert and oriented x 3.  SKIN: No obvious rash, lesion, or ulcer.   LABORATORY PANEL:  CBC Recent Labs  Lab 05/26/17 0459  WBC 8.6  HGB 14.4  HCT 42.8  PLT 175    Chemistries  Recent Labs  Lab 05/23/17 1917  05/26/17 0459  NA 138   < > 135  K 3.8   < > 3.9  CL 103   < > 101  CO2 24   < > 20*  GLUCOSE 112*   < > 76  BUN 23*   < > 18  CREATININE 1.15   < > 1.19  CALCIUM 9.2   < > 9.0  AST 48*  --   --   ALT 43  --   --   ALKPHOS 86  --   --   BILITOT 0.9  --   --    < > = values in this interval not displayed.   Cardiac Enzymes No results for input(s): TROPONINI in the last 168 hours. RADIOLOGY:  No results found. ASSESSMENT AND PLAN:  [name] [name]  is a 83 y.o. male with a known history of chronic kidney disease stage III, hyperlipidemia, hypertension, left leg DVT, myocardial infarction, pulmonary  embolism, thoracic aortic atherosclerosis, osteoarthritis presented to the emergency room with left lower extremity pain. Patient lost balance and slide down in the bathtub from standing position today  1. 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 -Continue IV heparin drip. - vascular consultation appreciated. LE angiogram today by Dr dew  2. Hypertension -Continue atenolol.  3. Hyperlipidemia -On statin  4. Chronic  kidney disease stage III    Case discussed with Care Management/Social Worker. Management plans discussed with the patient, family and they are in agreement.  CODE STATUS: Full  DVT Prophylaxis: Heparin drip  TOTAL TIME TAKING CARE OF THIS PATIENT: 25 minutes.  >50% time spent on counselling and coordination of care  POSSIBLE D/C IN *1-2 DAYS, DEPENDING ON CLINICAL CONDITION.  Note: This dictation was prepared with Dragon dictation along with smaller phrase technology. Any transcriptional errors that result from this process are unintentional.  Enedina FinnerSona Nicolai Labonte M.D on 05/26/2017 at 10:32 AM  Between 7am to 6pm - Pager - [phone]  After 6pm go to [email or web address] - Social research officer, governmentpassword [employer] [facility]  [facility]  Office  [phone]  CC: Primary care physician; Kerman PasseyLada, Melinda P, MD

## 2017-05-26 NOTE — Progress Notes (Signed)
ANTICOAGULATION CONSULT NOTE - Follow Up Consult  Pharmacy Consult for heparin Indication: Left femoral occlusion  Allergies  Allergen Reactions  . Aspirin Anaphylaxis, Swelling and Shortness Of Breath    REACTION: terrible bp reaction/wheezing  . Bee Pollen Other (See Comments)    ragweed  . Pollen Extract     ragweed  . Rabbit Epithelium     rabbits    Patient Measurements: Height: 5\' 3"  (160 cm) Weight: 150 lb (68 kg) IBW/kg (Calculated) : 56.9 Heparin Dosing Weight: 68 kg  Vital Signs: Temp: 98.6 F (37 C) (11/26 0441) Temp Source: Oral (11/26 0441) BP: 155/70 (11/26 1319) Pulse Rate: 69 (11/26 1319)  Labs: Recent Labs    05/23/17 1917  05/23/17 2316 05/24/17 0650  05/25/17 0107 05/25/17 0449 05/26/17 0459 05/26/17 1301  HGB  --    < >  --  13.0  --   --  14.1 14.4  --   HCT  --    < >  --  38.7*  --   --  42.3 42.8  --   PLT  --    < >  --  163  --   --  174 175  --   APTT  --   --  29  --   --   --   --   --   --   LABPROT  --   --  13.8  --   --   --   --  13.4  --   INR  --   --  1.07  --   --   --   --  1.03  --   HEPARINUNFRC  --   --   --  1.08*   < > 0.41  --  0.29* 0.46  CREATININE 1.15  --   --  1.10  --   --   --  1.19  --    < > = values in this interval not displayed.    Estimated Creatinine Clearance: 35.9 mL/min (by C-G formula based on SCr of 1.19 mg/dL).   Medical History: Past Medical History:  Diagnosis Date  . Arthritis   . CKD (chronic kidney disease) stage 3, GFR 30-59 ml/min (HCC) 12/29/2015  . Hyperlipidemia   . Hypertension   . Left leg DVT (HCC) 01/2014  . Myocardial infarction (HCC)   . Pulmonary embolism (HCC) 01/2014  . Thoracic aorta atherosclerosis (HCC) 10/08/2016    Medications:  Scheduled:  . [MAR Hold] amoxicillin-clavulanate  1 tablet Oral Q12H  . [MAR Hold] atenolol  50 mg Oral Daily  . [MAR Hold] cholecalciferol  5,000 Units Oral Daily  . [MAR Hold] donepezil  5 mg Oral QPM  . [MAR Hold] omega-3 acid ethyl  esters  1 g Oral Daily  . [MAR Hold] rosuvastatin  10 mg Oral Daily  . [MAR Hold] sodium chloride flush  3 mL Intravenous Q12H  . [MAR Hold] tiZANidine  2 mg Oral Q6H  . [MAR Hold] vitamin B-12  1,000 mcg Oral Daily    Assessment: Patient admitted s/t to fall in bathtub resulting in painful lower extremities and difficulty walking. Patient has a h/o of bilateral hip replacements and on CT shown to have a total or near total occlusion of the left femoral artery and absent flow in the right peroneal artery. Patient does not appear to be on any PTA anticoagulation. Heparin drip is being started for femoral occlusion  11/26 1300 Heparin level resulted at  0.46 Current Heparin rate of 1000 units/hr  Goal of Therapy:  Heparin level 0.3-0.7 units/ml Monitor platelets by anticoagulation protocol: Yes   Plan:  Will continue with current rate of 1000 units/hr and check a confirmatory Heparin level in 8 hours. Daily CBC while on Heparin infusion.   Clovia CuffLisa Dannya Pitkin, PharmD, BCPS 05/26/2017 2:23 PM

## 2017-05-26 NOTE — Op Note (Signed)
[facility] Percutaneous Study/Intervention Procedural Note   Date of Surgery: 05/26/2017  Surgeon(s):DEW,JASON   Assistants:none  Pre-operative Diagnosis: PAD with gangrene left lower extremity, apparent acute on chronic disease  Post-operative diagnosis: Same  Procedure(s) Performed: 1. Ultrasound guidance for vascular access right femoral artery 2. Catheter placement into left posterior tibial and left peroneal artery from right femoral approach 3. Aortogram and selective left lower extremity angiogram 4. Catheter directed thrombolytic therapy to the left popliteal artery with 8 mg of TPA 5. Mechanical thrombectomy with penumbra cat 6 device to the left popliteal artery, tibioperoneal trunk, and posterior tibial arteries  6.  Viabahn covered stent placement to the left popliteal artery for residual thrombus and 2 areas of high-grade stenosis with a 6 mm diameter by 15 cm length covered stent  7.  Percutaneous transluminal angioplasty of the posterior tibial artery and tibioperoneal trunk with 3 mm diameter by 30 cm length angioplasty balloon  8.  Percutaneous transluminal angioplasty of the left peroneal artery with 2.5 mm diameter by 10 cm length angioplasty 9. StarClose closure device right femoral artery  EBL: 200 cc  Contrast: 50 cc  Fluoro Time: 10.3 minutes  Moderate Conscious Sedation Time: approximately 70 minutes using 3 mg of Versed and 100 Mcg of Fentanyl  Indications: Patient is a 83 y.o.male with gangrene of the left fifth toe and ischemic symptoms of the foot.  This had somewhat of an acute on chronic presentation and he was admitted to the hospital and started on heparin. The patient had a CT scan showing a left popliteal and tibial occlusion and the interpretation was worrisome for embolic disease although clearly native disease was present as  well. The patient is brought in for angiography for further evaluation and potential treatment. Risks and benefits are discussed and informed consent is obtained  Procedure: The patient was identified and appropriate procedural time out was performed. The patient was then placed supine on the table and prepped and draped in the usual sterile fashion.Moderate conscious sedation was administered during a face to face encounter with the patient throughout the procedure with my supervision of the RN administering medicines and monitoring the patient's vital signs, pulse oximetry, telemetry and mental status throughout from the start of the procedure until the patient was taken to the recovery room. Ultrasound was used to evaluate the right common femoral artery. It was patent . A digital ultrasound image was acquired. A Seldinger needle was used to access the right common femoral artery under direct ultrasound guidance and a permanent image was performed. A 0.035 J wire was advanced without resistance and a 5Fr sheath was placed. Pigtail catheter was placed into the aorta and an AP aortogram was performed. This demonstrated normal renal arteries and normal aorta and iliac segments without significant stenosis. I then crossed the aortic bifurcation and advanced to the left femoral head. Selective left lower extremity angiogram was then performed. This demonstrated the common femoral artery, profunda femoris artery, and the majority of the superficial femoral artery were basically normal with calcific plaque but no stenoses.  At [facility] canal, there was an abrupt occlusion.  Given his history of somewhat abrupt onset, consideration for embolic disease was present with his atrial fibrillation although this could also have been acute on chronic disease as the tibials were all very calcific.  There was reconstitution of all 3 tibial vessels distally. The patient was systemically heparinized and a 6 French 90 cm  sheath was then placed over the  Terumo Advantage wire. I then used a Kumpe catheter and the advantage wire to navigate through the occlusion and confirmed intraluminal flow in the distal posterior tibial artery.  I then advanced the sheath into the occlusion in the popliteal artery and instilled 8 mg of TPA allowing this to dwell for several minutes.  I then made and exchanged for a 0.018 wire and a CXI catheter and parked the wire and the foot through the posterior tibial artery.  I then used the penumbra cat 6 device and made multiple passes in the left popliteal artery, tibioperoneal trunk, and down into the proximal posterior tibial artery.  Approximately 200 cc of effluent returned with multiple chunks of thrombus with these passes.  Angiogram following this showed mild residual thrombosis of the popliteal artery with 2 areas of high-grade residual stenosis, one just above the knee and one in the proximal popliteal artery.  I elected to cover these areas with a covered stent and a 6 mm diameter by 15 cm length covered stent using a Viabahn stent was deployed in the popliteal artery from the proximal popliteal artery to the knee.  This was postdilated with a 5 mm balloon with excellent angiographic completion result and less than 10% residual stenosis.  At this point, the anterior tibial artery was now continuous with multiple areas of mild to moderate disease.  The posterior tibial artery and tibioperoneal trunk as well as the peroneal arteries remained occluded.  I then used a 3 mm diameter by 30 cm length angioplasty balloon inflated from the distal posterior tibial artery up through the tibioperoneal trunk.  This was taken to 12 atm for 1 minute.  On deflation, they remained disease which was likely chronic in the distal posterior tibial artery with greater than 50% residual stenosis, but the flow was significantly improved in the tibioperoneal trunk.  There was now a focal occlusion about 6 cm beyond the  origin of the peroneal artery and then this added a second runoff vessel distally.  I brought the wire back in with the CXI catheter and the V 18 wire was able to gain access to the peroneal artery and cross the occlusion without difficulty.  A 2.5 mm diameter by 10 cm length angioplasty balloon was then inflated to 10 atm for 1 minute and the left peroneal artery.  Completion angiogram following all of these interventions demonstrated the popliteal stent to be widely patent and the tibioperoneal trunk to now be widely patent with no significant residual stenosis.  The anterior tibial artery provided runoff to the foot and the peroneal artery provided a second runoff vessel distally with less than 30% residual stenosis in the areas treated.  There remained continued significant disease in the posterior tibial artery but with two-vessel runoff distally, I felt we had markedly improved his perfusion. I elected to terminate the procedure. The sheath was removed and StarClose closure device was deployed in the right femoral artery with excellent hemostatic result. The patient was taken to the recovery room in stable condition having tolerated the procedure well.  Findings:  Aortogram:No obvious stenosis within the renal arteries although the catheter position was slightly low.  Very steep aortic bifurcation with calcific aorta but no stenosis in the aorta or iliac segments. Left lower Extremity:Common femoral artery, profunda femoris artery, and the majority of the superficial femoral artery were basically normal with calcific plaque but no stenoses.  At [facility] canal, there was an abrupt occlusion.  Given his history of somewhat abrupt  onset, consideration for embolic disease was present with his atrial fibrillation although this could also have been acute on chronic disease as the tibials were all very calcific.  There was reconstitution of all 3 tibial vessels  distally.   Disposition: Patient was taken to the recovery room in stable condition having tolerated the procedure well.  Complications: None  Leotis Pain 05/26/2017 3:57 PM   This note was created with Dragon Medical transcription system. Any errors in dictation are purely unintentional.

## 2017-05-27 ENCOUNTER — Encounter: Payer: Self-pay | Admitting: Vascular Surgery

## 2017-05-27 LAB — CBC
HCT: 37.3 % — ABNORMAL LOW (ref 40.0–52.0)
Hemoglobin: 12.6 g/dL — ABNORMAL LOW (ref 13.0–18.0)
MCH: 30.7 pg (ref 26.0–34.0)
MCHC: 33.6 g/dL (ref 32.0–36.0)
MCV: 91.3 fL (ref 80.0–100.0)
PLATELETS: 183 10*3/uL (ref 150–440)
RBC: 4.09 MIL/uL — ABNORMAL LOW (ref 4.40–5.90)
RDW: 14.5 % (ref 11.5–14.5)
WBC: 10.3 10*3/uL (ref 3.8–10.6)

## 2017-05-27 LAB — HEPARIN LEVEL (UNFRACTIONATED)
Heparin Unfractionated: 0.36 IU/mL (ref 0.30–0.70)
Heparin Unfractionated: 0.33 IU/mL (ref 0.30–0.70)

## 2017-05-27 MED ORDER — APIXABAN 5 MG PO TABS
5.0000 mg | ORAL_TABLET | Freq: Once | ORAL | Status: AC
  Administered 2017-05-27: 5 mg via ORAL

## 2017-05-27 MED ORDER — APIXABAN 5 MG PO TABS
5.0000 mg | ORAL_TABLET | Freq: Two times a day (BID) | ORAL | Status: DC
  Administered 2017-05-27: 5 mg via ORAL
  Filled 2017-05-27: qty 1

## 2017-05-27 MED ORDER — APIXABAN 5 MG PO TABS
10.0000 mg | ORAL_TABLET | Freq: Two times a day (BID) | ORAL | Status: DC
  Administered 2017-05-27 – 2017-05-28 (×2): 10 mg via ORAL
  Filled 2017-05-27 (×2): qty 2

## 2017-05-27 MED ORDER — APIXABAN 5 MG PO TABS: 5.0000 mg | ORAL_TABLET | Freq: Two times a day (BID) | ORAL | Status: DC

## 2017-05-27 NOTE — Progress Notes (Signed)
[facility] [place] [facility]   PATIENT NAME: [name] [name]    MR#:  [id number]  DATE OF BIRTH:  05-Mar-1928  SUBJECTIVE:  Left foot pain s/p arteriogram, angioplasty and stent placement of left popliteal artery Poor appetite  REVIEW OF SYSTEMS:   Review of Systems  Constitutional: Negative for chills, fever and weight loss.  HENT: Negative for ear discharge, ear pain and nosebleeds.   Eyes: Negative for blurred vision, pain and discharge.  Respiratory: Negative for sputum production, shortness of breath, wheezing and stridor.   Cardiovascular: Negative for chest pain, palpitations, orthopnea and PND.  Gastrointestinal: Negative for abdominal pain, diarrhea, nausea and vomiting.  Genitourinary: Negative for frequency and urgency.  Musculoskeletal: Positive for joint pain. Negative for back pain.       Left foot pain  Neurological: Positive for weakness. Negative for sensory change, speech change and focal weakness.  Psychiatric/Behavioral: Negative for depression and hallucinations. The patient is not nervous/anxious.    Tolerating Diet:little Tolerating PT: pending  DRUG ALLERGIES:   Allergies  Allergen Reactions  . Aspirin Anaphylaxis, Swelling and Shortness Of Breath    REACTION: terrible bp reaction/wheezing  . Bee Pollen Other (See Comments)    ragweed  . Pollen Extract     ragweed  . Rabbit Epithelium     rabbits    VITALS:  Blood pressure (!) 117/54, pulse 70, temperature 98.3 F (36.8 C), temperature source Oral, resp. rate 16, height 5\' 3"  (1.6 m), weight 68 kg (150 lb), SpO2 97 %.  PHYSICAL EXAMINATION:   Physical Exam  GENERAL:  83 y.o.-year-old patient lying in the bed with no acute distress.  EYES: Pupils equal, round, reactive to light and accommodation. No scleral icterus. Extraocular muscles intact.  HEENT: Head atraumatic, normocephalic. Oropharynx and nasopharynx clear.  NECK:  Supple, no jugular venous distention.  No thyroid enlargement, no tenderness.  LUNGS: Normal breath sounds bilaterally, no wheezing, rales, rhonchi. No use of accessory muscles of respiration.  CARDIOVASCULAR: S1, S2 normal. No murmurs, rubs, or gallops.  ABDOMEN: Soft, nontender, nondistended. Bowel sounds present. No organomegaly or mass.  EXTREMITIES: No cyanosis, clubbing or edema b/l.   Left 5th toe gangrene NEUROLOGIC: Cranial nerves II through XII are intact. No focal Motor or sensory deficits b/l.   PSYCHIATRIC:  patient is alert and oriented x 3.  SKIN: No obvious rash, lesion, or ulcer.   LABORATORY PANEL:  CBC Recent Labs  Lab 05/27/17 0759  WBC 10.3  HGB 12.6*  HCT 37.3*  PLT 183    Chemistries  Recent Labs  Lab 05/23/17 1917  05/26/17 0459  NA 138   < > 135  K 3.8   < > 3.9  CL 103   < > 101  CO2 24   < > 20*  GLUCOSE 112*   < > 76  BUN 23*   < > 18  CREATININE 1.15   < > 1.19  CALCIUM 9.2   < > 9.0  AST 48*  --   --   ALT 43  --   --   ALKPHOS 86  --   --   BILITOT 0.9  --   --    < > = values in this interval not displayed.   Cardiac Enzymes No results for input(s): TROPONINI in the last 168 hours. RADIOLOGY:  No results found. ASSESSMENT AND PLAN:  [name] [name]  is a 83 y.o. male with a known history of chronic kidney disease  stage III, hyperlipidemia, hypertension, left leg DVT, myocardial infarction, pulmonary embolism, thoracic aortic atherosclerosis, osteoarthritis presented to the emergency room with left lower extremity pain. Patient lost balance and slide down in the bathtub from standing position today  1. 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 -Continue IV heparin drip.---Now transition to p.o. Eliquis - vascular consultation appreciated.  Dr. dew  performed Catheter directed thrombolytic therapy to the left popliteal artery with 8 mg of TPA. Mechanical thrombectomy with penumbra cat 6 device to the left popliteal artery, tibioperoneal trunk, and posterior tibial arteries. Viabahn covered stent placement to the left popliteal artery for residual thrombus and 2 areas of high-grade stenosis with a 6 mm diameter by 15 cm length covered stent. Percutaneous transluminal angioplasty of the posterior tibial artery and tibioperoneal trunk with 3 mm diameter by 30 cm length angioplasty balloon  Percutaneous transluminal angioplasty of the left peroneal artery with 2.5 mm diameter by 10 cm length angioplasty  2. Hypertension -Continue atenolol.  3. Hyperlipidemia -On statin  4. Chronic kidney disease stage III  Physical therapy recommending rehab. Social worker for discharge planning  Case discussed with Care Management/Social Worker. Management plans discussed with the patient, family and they are in agreement.  CODE STATUS: Full  DVT Prophylaxis: Eliquis TOTAL TIME TAKING CARE OF THIS PATIENT: 25 minutes.  >50% time spent on counselling and coordination of care  POSSIBLE D/C IN *1-2 DAYS, DEPENDING ON CLINICAL CONDITION.  Note: This dictation was prepared with Dragon dictation along with smaller phrase technology. Any transcriptional errors that result from this process are unintentional.  Enedina FinnerSona Kie Calvin M.D on 05/27/2017 at 3:35 PM  Between 7am to 6pm - Pager - [phone]  After 6pm go to [email or web address] - Social research officer, governmentpassword [email or web address]  [facility]  Office  [phone]  CC: Primary care physician; Kerman PasseyLada, Melinda P, MD

## 2017-05-27 NOTE — Clinical Social Work Note (Signed)
Clinical Social Work Assessment  Patient Details  Name: [name] [name] E Kleinfelter MRN: [id number] Date of Birth: 08/04/1927  Date of referral:  05/27/17               Reason for consult:  Discharge Planning                Permission sought to share information with:    Permission granted to share information::     Name::        Agency::     Relationship::     Contact Information:     Housing/Transportation Living arrangements for the past 2 months:  Single Family Home Source of Information:  Adult Children Patient Interpreter Needed:  None Criminal Activity/Legal Involvement Pertinent to Current Situation/Hospitalization:  No - Comment as needed Significant Relationships:  Adult Children, Spouse Lives with:  Spouse Do you feel safe going back to the place where you live?    Need for family participation in patient care:  Yes (Comment)  Care giving concerns:  Patient resides with his wife at home.    Social Worker assessment / plan:  CSW attempted to speak with patient but he was soundly sleeping. CSW called patient's daughter: Charna ElizabethSherry Mickelson: [phone]: [phone] and informed her of PT recommendation for STR. [name] stated that she would speak with her mother and then call me back. She called CSW back and stated that they would like for a bed search to be begun but that patient may end up not wanting to go. She stated that they cannot take patient home if he cannot walk and they are going to be speaking with patient. CSW called patient's daughter later in the afternoon and extended the bed offers. She chose [facility] and CSW sent PT note once it had been documented. [facility] aware patient to discharge more than likely tomorrow and that they are to begin prior auth with medicare humana immediately.  Employment status:  Retired Database administratornsurance information:  Managed Medicare PT Recommendations:  Skilled Nursing Facility Information / Referral to community resources:      Patient/Family's Response to care:  Patient's daughter expressed appreciation for CSW assistance. Patient/Family's Understanding of and Emotional Response to Diagnosis, Current Treatment, and Prognosis:  Patient's wife is going to speak with her husband regarding STR.  Emotional Assessment Appearance:  Appears stated age Attitude/Demeanor/Rapport:    Affect (typically observed):    Orientation:    Alcohol / Substance use:  Not Applicable Psych involvement (Current and /or in the community):  No (Comment)  Discharge Needs  Concerns to be addressed:  Care Coordination Readmission within the last 30 days:  No Current discharge risk:  None Barriers to Discharge:  No Barriers Identified   York SpanielMonica Jennamarie Goings, LCSW 05/27/2017, 3:59 PM

## 2017-05-27 NOTE — Evaluation (Signed)
Physical Therapy Evaluation Patient Details Name: [name] [name] E Whitener MRN: [id number] DOB: 06/14/1928 Today's Date: 05/27/2017   History of Present Illness  Pt is an 83 y.o. male presenting to hospital s/p fall 3 days prior in shower with leg pain; pt noted to have L foot pain, blackish discoloration of little toe L foot with decreased blood circulation.  Pt admitted with total or near total occlusion distal L superficial femoral artery extending through the politeal and s/p angio 05/26/17.  PMH includes htn, L LE DVT, MI, PE, B THR.  Clinical Impression  Prior to recent fall and hospital admission, pt was ambulatory with walker.  Pt lives with his wife in 1 level (2nd floor) apartment with elevator access.  Currently pt is CGA supine to sit (significant increased time and effort to perform with heavy use of bed rails); mod to max assist to stand with RW (pt pushing B LE's against bed in order to stabilize in standing); and min to mod assist stand pivot bed to recliner (heavy use of UE's on furniture in room for stability with increased time and effort to perform).  Unable to ambulate with RW (pt able to advance L LE with R LE braced against bed but pt unable to advance R LE d/t L foot pain and L LE buckling).  Pt would benefit from skilled PT to address noted impairments and functional limitations (see below for any additional details).  Upon hospital discharge, recommend pt discharge to STR.    Follow Up Recommendations SNF    Equipment Recommendations  Rolling walker with 5" wheels    Recommendations for Other Services OT consult     Precautions / Restrictions Precautions Precautions: Fall Restrictions Weight Bearing Restrictions: No      Mobility  Bed Mobility Overal bed mobility: Needs Assistance Bed Mobility: Supine to Sit     Supine to sit: Min guard;HOB elevated     General bed mobility comments: significantly increased time and effort for pt to perform on own; significant  use of bed rails noted  Transfers Overall transfer level: Needs assistance Equipment used: Rolling walker (2 wheeled) Transfers: Sit to/from [employer]Stand;Stand Pivot Transfers Sit to Stand: Mod assist;Max assist Stand pivot transfers: Min assist;Mod assist       General transfer comment: trialed standing x3 trials from edge of bed with RW but each attempt pt requiring assist to initiate stand and come to full upright position (pt attempting to perform on own and with own technique; also trialed with PT cueing); pt pushing LE's against bed each trial standing and unable (with assist and vc's) to get pt to not push LE's against bed for stability; pt preferring his method of transfers and grabbing onto furniture in room in order to stabilize in standing and eventually take a few small steps to pivot to chair with assist (with significant increased time and effort by pt)  Ambulation/Gait             General Gait Details: pt unable to take steps with RW with assist and multiple trials with cueing (only able to advance L LE, not R LE d/t L foot pain); pt bracing R>L LE against bed for stability  Stairs            Wheelchair Mobility    Modified Rankin (Stroke Patients Only)       Balance Overall balance assessment: Needs assistance Sitting-balance support: Bilateral upper extremity supported;Feet supported Sitting balance-Leahy Scale: Poor Sitting balance - Comments: requires UE support  for static sitting balance   Standing balance support: Bilateral upper extremity supported(on RW) Standing balance-Leahy Scale: Poor Standing balance comment: pt bracing LE's against bed in order to maintain stability in standing using RW                             Pertinent Vitals/Pain Pain Assessment: Faces Faces Pain Scale: Hurts even more Pain Location: L foot with functional mobility Pain Descriptors / Indicators: Grimacing;Tender;Discomfort Pain Intervention(s): Limited  activity within patient's tolerance;Monitored during session;Premedicated before session;Repositioned  Vitals (HR and O2 on room air) stable and WFL throughout treatment session.    Home Living Family/patient expects to be discharged to:: Private residence Living Arrangements: Spouse/significant other Available Help at Discharge: Family Type of Home: Apartment(2nd floor apt with elevator access) Home Access: Elevator     Home Layout: One level Home Equipment: Grab bars - toilet;Grab bars - tub/shower;Cane - single point;Walker - 4 wheels      Prior Function Level of Independence: Independent with assistive device(s)         Comments: Pt was using SPC but most recently ambulating with 4ww.     Hand Dominance        Extremity/Trunk Assessment   Upper Extremity Assessment Upper Extremity Assessment: Generalized weakness    Lower Extremity Assessment Lower Extremity Assessment: Generalized weakness    Cervical / Trunk Assessment Cervical / Trunk Assessment: Normal  Communication   Communication: HOH  Cognition Arousal/Alertness: Awake/alert Behavior During Therapy: WFL for tasks assessed/performed Overall Cognitive Status: Within Functional Limits for tasks assessed                                        General Comments General comments (skin integrity, edema, etc.): Pt resting in bed upon PT entry.  Nursing cleared pt for participation in physical therapy.  Pt agreeable to PT session.    Exercises  Transfer training.   Assessment/Plan    PT Assessment Patient needs continued PT services  PT Problem List Decreased strength;Decreased activity tolerance;Decreased balance;Decreased mobility;Decreased knowledge of use of DME;Decreased knowledge of precautions;Pain       PT Treatment Interventions DME instruction;Gait training;Functional mobility training;Therapeutic activities;Therapeutic exercise;Balance training;Patient/family education    PT  Goals (Current goals can be found in the Care Plan section)  Acute Rehab PT Goals Patient Stated Goal: to go home PT Goal Formulation: With patient Time For Goal Achievement: 06/10/17 Potential to Achieve Goals: Fair    Frequency Min 2X/week   Barriers to discharge Decreased caregiver support      Co-evaluation               AM-PAC PT "6 Clicks" Daily Activity  Outcome Measure Difficulty turning over in bed (including adjusting bedclothes, sheets and blankets)?: A Lot Difficulty moving from lying on back to sitting on the side of the bed? : A Lot Difficulty sitting down on and standing up from a chair with arms (e.g., wheelchair, bedside commode, etc,.)?: Unable Help needed moving to and from a bed to chair (including a wheelchair)?: A Lot Help needed walking in hospital room?: Total Help needed climbing 3-5 steps with a railing? : Total 6 Click Score: 9    End of Session Equipment Utilized During Treatment: Gait belt Activity Tolerance: Patient limited by pain Patient left: in chair;with call bell/phone within reach;with chair alarm set Nurse  Communication: Mobility status;Precautions;Other (comment)(Chair pad and alarm in place (no cord present to hook into call light system)) PT Visit Diagnosis: Other abnormalities of gait and mobility (R26.89);Muscle weakness (generalized) (M62.81);Pain Pain - Right/Left: Left Pain - part of body: Ankle and joints of foot    Time: 1125-1203 PT Time Calculation (min) (ACUTE ONLY): 38 min   Charges:   PT Evaluation $PT Eval Low Complexity: 1 Low PT Treatments $Therapeutic Activity: 23-37 mins   PT G Codes:   PT G-Codes **NOT FOR INPATIENT CLASS** Functional Assessment Tool Used: AM-PAC 6 Clicks Basic Mobility Functional Limitation: Mobility: Walking and moving around Mobility: Walking and Moving Around Current Status ([id number]([id number]): At least 60 percent but less than 80 percent impaired, limited or restricted Mobility: Walking and  Moving Around Goal Status [phone](G8979): At least 1 percent but less than 20 percent impaired, limited or restricted    Hendricks Limesmily Karishma Unrein, PT 05/27/17, 3:36 PM [phone]

## 2017-05-27 NOTE — Progress Notes (Signed)
Found patient hanging off edge of bed, alarm on, but not alarming; assisted patient back to bed and toileted; strongly encouraged to call for assistance; phone, urinal within reach; alarm set on 2nd level; patient voiced understanding, stated that he was sorry, and knew he was supposed to call; Windy Carinaurner,Leesha Veno K, RN 1:52 AM 05/27/2017

## 2017-05-27 NOTE — NC FL2 (Signed)
[place] MEDICAID FL2 LEVEL OF CARE SCREENING TOOL     IDENTIFICATION  Patient Name: [name] [name] Birthdate: 1927/11/25 Sex: male Admission Date (Current Location): 05/23/2017  [place]   Facility and Address:  [facility], [address], [place], [place] [address]      Provider Number: [id number]  Attending Physician Name and Address:  Enedina FinnerPatel, Sona, MD  Relative Name and Phone Number:       Current Level of Care: Hospital Recommended Level of Care: Skilled Nursing Facility Prior Approval Number:    Date Approved/Denied:   PASRR Number:    Discharge Plan: SNF    Current Diagnoses: Patient Active Problem List   Diagnosis Date Noted  . CAD (coronary artery disease), native coronary artery 05/24/2017  . Arterial occlusion 05/23/2017  . Dermatitis 10/08/2016  . Thoracic aorta atherosclerosis (HCC) 10/08/2016  . CKD (chronic kidney disease) stage 3, GFR 30-59 ml/min (HCC) 12/29/2015  . Anemia 12/29/2015  . Medication monitoring encounter 12/29/2015  . Presbycusis 12/29/2015  . Cough 07/05/2015  . History of anticoagulant therapy 01/16/2015  . History of pulmonary embolus (PE) 01/16/2015  . Glaucoma 01/16/2015  . H/O malignant neoplasm of skin 01/16/2015  . Hyperlipidemia LDL goal <70 01/16/2015  . Hypertension goal BP (blood pressure) < 140/90 01/16/2015  . 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 Calculus of kidney 01/16/2015  . Personal history of DVT (deep vein thrombosis) 01/16/2015  . Presence of stent in coronary artery 01/16/2015  . Pruritic dermatitis 01/16/2015    Orientation RESPIRATION BLADDER Height & Weight     Self, Time, Situation, Place  Normal Continent Weight: 150 lb (68 kg) Height:  5\' 3"  (160 cm)  BEHAVIORAL SYMPTOMS/MOOD NEUROLOGICAL BOWEL NUTRITION STATUS  (none) (none) Continent Diet(cardiac )  AMBULATORY STATUS COMMUNICATION OF  NEEDS Skin   Extensive Assist Verbally Normal                       Personal Care Assistance Level of Assistance  Bathing, Dressing Bathing Assistance: Maximum assistance   Dressing Assistance: Maximum assistance     Functional Limitations Info  Hearing   Hearing Info: Impaired      SPECIAL CARE FACTORS FREQUENCY  PT (By licensed PT)                    Contractures Contractures Info: Not present    Additional Factors Info  Code Status, Allergies Code Status Info: full Allergies Info: asa, bee pollen, rabbit skin cells           Current Medications (05/27/2017):  This is the current hospital active medication list Current Facility-Administered Medications  Medication Dose Route Frequency Provider Last Rate Last Dose  . 0.9 %  sodium chloride infusion  250 mL Intravenous PRN Pyreddy, Vivien RotaPavan, MD      . acetaminophen (TYLENOL) tablet 650 mg  650 mg Oral Q6H PRN Pyreddy, Vivien RotaPavan, MD       Or  . acetaminophen (TYLENOL) suppository 650 mg  650 mg Rectal Q6H PRN Pyreddy, Pavan, MD      . albuterol (PROVENTIL) (2.5 MG/3ML) 0.083% nebulizer solution 2.5 mg  2.5 mg Inhalation Q4H PRN Ihor AustinPyreddy, Pavan, MD      . amoxicillin-clavulanate (AUGMENTIN) 875-125 MG per tablet 1 tablet  1 tablet Oral Q12H Enedina FinnerPatel, Sona, MD   1 tablet at 05/27/17 0904  . apixaban (ELIQUIS) tablet 10 mg  10 mg Oral BID Enedina FinnerPatel, Sona, MD       Followed by  . [START ON 06/03/2017] apixaban (ELIQUIS) tablet 5 mg  5 mg Oral BID Enedina FinnerPatel, Sona, MD      . atenolol (TENORMIN) tablet 50 mg  50 mg Oral Daily Ihor AustinPyreddy, Pavan, MD   50 mg at 05/27/17 0903  . cholecalciferol (VITAMIN D) tablet 5,000 Units  5,000 Units Oral Daily Ihor AustinPyreddy, Pavan, MD   5,000 Units at 05/27/17 0904  . donepezil (ARICEPT) tablet 5 mg  5 mg Oral QPM Pyreddy, Vivien RotaPavan, MD   5 mg at 05/26/17 1959  . loratadine (CLARITIN) tablet 10 mg  10 mg Oral Daily PRN Pyreddy, Vivien RotaPavan, MD      . morphine 2 MG/ML injection 2 mg  2 mg Intravenous Q4H PRN Ihor AustinPyreddy,  Pavan, MD   2 mg at 05/26/17 1934  . omega-3 acid ethyl esters (LOVAZA) capsule 1 g  1 g Oral Daily Pyreddy, Vivien RotaPavan, MD   1 g at 05/27/17 0903  . ondansetron (ZOFRAN) tablet 4 mg  4 mg Oral Q6H PRN Pyreddy, Vivien RotaPavan, MD       Or  . ondansetron (ZOFRAN) injection 4 mg  4 mg Intravenous Q6H PRN Pyreddy, Vivien RotaPavan, MD      . oxyCODONE-acetaminophen (PERCOCET/ROXICET) 5-325 MG per tablet 1-2 tablet  1-2 tablet Oral Q4H PRN Enedina FinnerPatel, Sona, MD   1 tablet at 05/27/17 0555  . rosuvastatin (CRESTOR) tablet 10 mg  10 mg Oral Daily Pyreddy, Vivien RotaPavan, MD   10 mg at 05/26/17 1959  . senna-docusate (Senokot-S) tablet 1 tablet  1 tablet Oral QHS PRN Pyreddy, Vivien RotaPavan, MD      . sodium chloride flush (NS) 0.9 % injection 3 mL  3 mL Intravenous Q12H Pyreddy, Vivien RotaPavan, MD   3 mL at 05/27/17 0906  . sodium chloride flush (NS) 0.9 % injection 3 mL  3 mL Intravenous PRN Pyreddy, Vivien RotaPavan, MD      . tiZANidine (ZANAFLEX) tablet 2 mg  2 mg Oral Q6H Pyreddy, Pavan, MD   2 mg at 05/27/17 0551  . vitamin B-12 (CYANOCOBALAMIN) tablet 1,000 mcg  1,000 mcg Oral Daily Ihor AustinPyreddy, Pavan, MD   1,000 mcg at 05/27/17 [id number]     Discharge Medications: Please see discharge summary for a list of discharge medications.  Relevant Imaging Results:  Relevant Lab Results:   Additional Information ss: [id number]  York SpanielMonica Inocencia Murtaugh, LCSW

## 2017-05-27 NOTE — Progress Notes (Signed)
ANTICOAGULATION CONSULT NOTE - Follow Up Consult  Pharmacy Consult for heparin Indication: Left femoral occlusion  Allergies  Allergen Reactions  . Aspirin Anaphylaxis, Swelling and Shortness Of Breath    REACTION: terrible bp reaction/wheezing  . Bee Pollen Other (See Comments)    ragweed  . Pollen Extract     ragweed  . Rabbit Epithelium     rabbits    Patient Measurements: Height: 5\' 3"  (160 cm) Weight: 150 lb (68 kg) IBW/kg (Calculated) : 56.9 Heparin Dosing Weight: 68 kg  Vital Signs: Temp: 97.5 F (36.4 C) (11/26 1932) Temp Source: Oral (11/26 1932) BP: 150/69 (11/26 1932) Pulse Rate: 72 (11/26 1932)  Labs: Recent Labs    05/24/17 0650  05/25/17 0449 05/26/17 0459 05/26/17 1301 05/27/17 0027  HGB 13.0  --  14.1 14.4  --   --   HCT 38.7*  --  42.3 42.8  --   --   PLT 163  --  174 175  --   --   LABPROT  --   --   --  13.4  --   --   INR  --   --   --  1.03  --   --   HEPARINUNFRC 1.08*   < >  --  0.29* 0.46 0.36  CREATININE 1.10  --   --  1.19  --   --    < > = values in this interval not displayed.    Estimated Creatinine Clearance: 35.9 mL/min (by C-G formula based on SCr of 1.19 mg/dL).   Medical History: Past Medical History:  Diagnosis Date  . Arthritis   . CKD (chronic kidney disease) stage 3, GFR 30-59 ml/min (HCC) 12/29/2015  . Hyperlipidemia   . Hypertension   . Left leg DVT (HCC) 01/2014  . Myocardial infarction (HCC)   . Pulmonary embolism (HCC) 01/2014  . Thoracic aorta atherosclerosis (HCC) 10/08/2016    Medications:  Scheduled:  . amoxicillin-clavulanate  1 tablet Oral Q12H  . atenolol  50 mg Oral Daily  . cholecalciferol  5,000 Units Oral Daily  . donepezil  5 mg Oral QPM  . omega-3 acid ethyl esters  1 g Oral Daily  . rosuvastatin  10 mg Oral Daily  . sodium chloride flush  3 mL Intravenous Q12H  . tiZANidine  2 mg Oral Q6H  . vitamin B-12  1,000 mcg Oral Daily    Assessment: Patient admitted s/t to fall in bathtub  resulting in painful lower extremities and difficulty walking. Patient has a h/o of bilateral hip replacements and on CT shown to have a total or near total occlusion of the left femoral artery and absent flow in the right peroneal artery. Patient does not appear to be on any PTA anticoagulation. Heparin drip is being started for femoral occlusion  11/26 1300 Heparin level resulted at 0.46 Current Heparin rate of 1000 units/hr  Goal of Therapy:  Heparin level 0.3-0.7 units/ml Monitor platelets by anticoagulation protocol: Yes   Plan:  Will continue with current rate of 1000 units/hr and check a confirmatory Heparin level in 8 hours. Daily CBC while on Heparin infusion.   11/27 0030 heparin level 0.36. Patient pulled out IV that was running heparin earlier tonight. Out for ~30 minutes per RN. Will recheck confirmatory heparin level and CBC in 8 hours.    Fulton ReekMatt Racheal Mathurin, PharmD, BCPS  05/27/17 1:39 AM

## 2017-05-27 NOTE — Progress Notes (Signed)
ANTICOAGULATION CONSULT NOTE - Follow Up Consult  Pharmacy Consult for apixaban Indication: DVT  Allergies  Allergen Reactions  . Aspirin Anaphylaxis, Swelling and Shortness Of Breath    REACTION: terrible bp reaction/wheezing  . Bee Pollen Other (See Comments)    ragweed  . Pollen Extract     ragweed  . Rabbit Epithelium     rabbits    Patient Measurements: Height: 5\' 3"  (160 cm) Weight: 150 lb (68 kg) IBW/kg (Calculated) : 56.9 Heparin Dosing Weight:    Vital Signs: Temp: 98 F (36.7 C) (11/27 0426) Temp Source: Oral (11/27 0426) BP: 133/52 (11/27 0426) Pulse Rate: 77 (11/27 0426)  Labs: Recent Labs    05/25/17 0449 05/26/17 0459 05/26/17 1301 05/27/17 0027 05/27/17 0759  HGB 14.1 14.4  --   --  12.6*  HCT 42.3 42.8  --   --  37.3*  PLT 174 175  --   --  183  LABPROT  --  13.4  --   --   --   INR  --  1.03  --   --   --   HEPARINUNFRC  --  0.29* 0.46 0.36 0.33  CREATININE  --  1.19  --   --   --     Estimated Creatinine Clearance: 35.9 mL/min (by C-G formula based on SCr of 1.19 mg/dL).  Assessment: Patient is a 83yo male admitted for arterial occlusion. Initially started on Heparin drip. Had angiogram and TPA administration on 11/26 for occluded femoral artery. To be transitioned to apixaban for discharge.   Plan:  Will discontinue Heparin infusion and initiate patient on Apixaban 10mg  PO bid for 7 days followed by 5mg  PO bid thereafter.  Clovia CuffLisa Samiel Peel, PharmD, BCPS 05/27/2017 10:58 AM

## 2017-05-28 ENCOUNTER — Telehealth: Payer: Self-pay | Admitting: Family Medicine

## 2017-05-28 DIAGNOSIS — I10 Essential (primary) hypertension: Secondary | ICD-10-CM | POA: Diagnosis not present

## 2017-05-28 DIAGNOSIS — I7090 Unspecified atherosclerosis: Secondary | ICD-10-CM | POA: Diagnosis not present

## 2017-05-28 DIAGNOSIS — G9009 Other idiopathic peripheral autonomic neuropathy: Secondary | ICD-10-CM | POA: Diagnosis not present

## 2017-05-28 DIAGNOSIS — I96 Gangrene, not elsewhere classified: Secondary | ICD-10-CM | POA: Diagnosis not present

## 2017-05-28 DIAGNOSIS — Z87891 Personal history of nicotine dependence: Secondary | ICD-10-CM | POA: Diagnosis not present

## 2017-05-28 DIAGNOSIS — Z7401 Bed confinement status: Secondary | ICD-10-CM | POA: Diagnosis not present

## 2017-05-28 DIAGNOSIS — F0390 Unspecified dementia without behavioral disturbance: Secondary | ICD-10-CM | POA: Diagnosis not present

## 2017-05-28 DIAGNOSIS — R262 Difficulty in walking, not elsewhere classified: Secondary | ICD-10-CM | POA: Diagnosis not present

## 2017-05-28 DIAGNOSIS — R05 Cough: Secondary | ICD-10-CM | POA: Diagnosis not present

## 2017-05-28 DIAGNOSIS — M6281 Muscle weakness (generalized): Secondary | ICD-10-CM | POA: Diagnosis not present

## 2017-05-28 DIAGNOSIS — Z95820 Peripheral vascular angioplasty status with implants and grafts: Secondary | ICD-10-CM | POA: Diagnosis not present

## 2017-05-28 DIAGNOSIS — I1310 Hypertensive heart and chronic kidney disease without heart failure, with stage 1 through stage 4 chronic kidney disease, or unspecified chronic kidney disease: Secondary | ICD-10-CM | POA: Diagnosis not present

## 2017-05-28 DIAGNOSIS — Z23 Encounter for immunization: Secondary | ICD-10-CM | POA: Diagnosis not present

## 2017-05-28 DIAGNOSIS — I739 Peripheral vascular disease, unspecified: Secondary | ICD-10-CM | POA: Diagnosis not present

## 2017-05-28 DIAGNOSIS — I743 Embolism and thrombosis of arteries of the lower extremities: Secondary | ICD-10-CM | POA: Diagnosis not present

## 2017-05-28 DIAGNOSIS — I82402 Acute embolism and thrombosis of unspecified deep veins of left lower extremity: Secondary | ICD-10-CM | POA: Diagnosis not present

## 2017-05-28 DIAGNOSIS — R918 Other nonspecific abnormal finding of lung field: Secondary | ICD-10-CM | POA: Diagnosis not present

## 2017-05-28 DIAGNOSIS — R062 Wheezing: Secondary | ICD-10-CM | POA: Diagnosis not present

## 2017-05-28 DIAGNOSIS — I70262 Atherosclerosis of native arteries of extremities with gangrene, left leg: Secondary | ICD-10-CM | POA: Diagnosis not present

## 2017-05-28 DIAGNOSIS — R296 Repeated falls: Secondary | ICD-10-CM | POA: Diagnosis not present

## 2017-05-28 DIAGNOSIS — E785 Hyperlipidemia, unspecified: Secondary | ICD-10-CM | POA: Diagnosis not present

## 2017-05-28 DIAGNOSIS — I82432 Acute embolism and thrombosis of left popliteal vein: Secondary | ICD-10-CM | POA: Diagnosis not present

## 2017-05-28 DIAGNOSIS — N183 Chronic kidney disease, stage 3 (moderate): Secondary | ICD-10-CM | POA: Diagnosis not present

## 2017-05-28 DIAGNOSIS — M79662 Pain in left lower leg: Secondary | ICD-10-CM | POA: Diagnosis not present

## 2017-05-28 MED ORDER — APIXABAN 5 MG PO TABS: 10.0000 mg | ORAL_TABLET | Freq: Two times a day (BID) | ORAL | 0 refills | Status: DC

## 2017-05-28 MED ORDER — OXYCODONE-ACETAMINOPHEN 5-325 MG PO TABS: 1.0000 | ORAL_TABLET | ORAL | 0 refills | Status: DC | PRN

## 2017-05-28 MED ORDER — SENNOSIDES-DOCUSATE SODIUM 8.6-50 MG PO TABS: 1.0000 | ORAL_TABLET | Freq: Every evening | ORAL | 0 refills | Status: DC | PRN

## 2017-05-28 NOTE — Progress Notes (Signed)
Patient cleared for discharge. Called report to [facility] and EMS for non-emergent transport. Notified patients family. Explained discharge paperwork to patient. AVS and prescriptions in packet. Patient belongings gathered. IV removed. Patient awaiting pickup.

## 2017-05-28 NOTE — Telephone Encounter (Signed)
I called patient, left message; no need to call me back; I will reach out to him again later today ------------------------------------------------------------------ Transition Care Management Follow-up phone call Date discharged:  May 28, 2017 How have you been since you were released from the hospital?   Any concerns?  No  Items reviewed Medicines:  Yes Allergies:  Yes Dietary changes: No Referrals:  Yes  Functional questionnaire I = independent D = dependent  ADLs Personal hygiene:   Dressing:   Eating:   Maintaining continence:   Transfers:   IADLs Basic communication skills:   Transportation:   Meal preparation:   Shopping:   Housework:   Managing medicines:   Managing personal finances:    Confirmed importance and date/time of follow-up visit(s):   Provider appointment booked with PCP:  Yes, Jun 11, 2017; day 14 after hospitalization  Confirmed with patient that if condition begins to worsen, call PCP or go to the ER. Patient was given the office number [phone]) and encouraged to call back with questions or concerns:

## 2017-05-28 NOTE — Clinical Social Work Placement (Signed)
   CLINICAL SOCIAL WORK PLACEMENT  NOTE  Date:  05/28/2017  Patient Details  Name: [name] [name] MRN: [id number] Date of Birth: 1928-02-26  Clinical Social Work is seeking post-discharge placement for this patient at the Skilled  Nursing Facility level of care (*CSW will initial, date and re-position this form in  chart as items are completed):  Yes   Patient/family provided with [facility] Clinical Social Work Department's list of facilities offering this level of care within the geographic area requested by the patient (or if unable, by the patient's family).  Yes   Patient/family informed of their freedom to choose among providers that offer the needed level of care, that participate in Medicare, Medicaid or managed care program needed by the patient, have an available bed and are willing to accept the patient.  Yes   Patient/family informed of [facility]'s ownership interest in [facility] and [facility], as well as of the fact that they are under no obligation to receive care at these facilities.  PASRR submitted to EDS on       PASRR number received on       Existing PASRR number confirmed on 05/27/17     FL2 transmitted to all facilities in geographic area requested by pt/family on 05/27/17     FL2 transmitted to all facilities within larger geographic area on       Patient informed that his/her managed care company has contracts with or will negotiate with certain facilities, including the following:        Yes   Patient/family informed of bed offers received.  Patient chooses bed at [facility])     Physician recommends and patient chooses bed at [facility](SNF)    Patient to be transferred to [facility]) on 05/28/17.  Patient to be transferred to facility by (EMS)     Patient family notified on 05/28/17 of transfer.  Name of family member notified:  (wife)     PHYSICIAN       Additional Comment:     _______________________________________________ York SpanielMonica Derral Colucci, LCSW 05/28/2017, 1:23 PM

## 2017-05-28 NOTE — Discharge Summary (Signed)
[facility] - [place] [facility]   PATIENT NAME: [name] [name]    MR#:  [id number]  DATE OF BIRTH:  04/28/1928  DATE OF ADMISSION:  05/23/2017 ADMITTING PHYSICIAN: [name] AustinPavan Pyreddy, MD  DATE OF DISCHARGE: 05/28/2017  PRIMARY CARE PHYSICIAN: [name] PasseyLada, Melinda P, MD    ADMISSION DIAGNOSIS:  Arterial obstructive disease [I70.90] Left leg pain [M79.605]  DISCHARGE DIAGNOSIS:  1.Acute Left Superficial femoral and left popliteal artery occlusioon [name]/[name] thrombolytic rx with stent on left PA 2. Dry gangrene left 5 th toe 3.HTN 4.h/o Tobacco abuse SECONDARY DIAGNOSIS:   Past Medical History:  Diagnosis Date  . Arthritis   . CKD (chronic kidney disease) stage 3, GFR 30-59 ml/min (HCC) 12/29/2015  . Hyperlipidemia   . Hypertension   . Left leg DVT (HCC) 01/2014  . Myocardial infarction (HCC)   . Pulmonary embolism (HCC) 01/2014  . Thoracic aorta atherosclerosis (HCC) 10/08/2016    HOSPITAL COURSE:   [name] a known history of chronic kidney disease stage III, hyperlipidemia, hypertension, left leg DVT, myocardial infarction, pulmonary embolism, thoracic aortic atherosclerosis, osteoarthritis presented to the emergency room with left lower extremity pain. Patient lost balance and slide down in the bathtub from standing position today  1.Arterial occlusion of left superficial femoral artery extending up to popliteal artery with left 5 th toe gangrene(dry) -Patient presented with left leg severe pain along with fall and gangrene over the left fifth toe along with cyanotic foot -CT angiogram results noted. -Continue IV heparin drip.---Now transitioned to [name].o. Eliquis - vascular consultation appreciated.  [name] [name] performed Catheter directed thrombolytic therapy to the left popliteal artery with 8 mg of TPA. Mechanical thrombectomy with penumbra cat 6 device to the left popliteal artery, tibioperoneal trunk, and posterior tibial arteries.  Viabahn covered stent placement to the left popliteal artery for residual thrombus and 2 areas of high-grade stenosis with a 6 mm diameter by 15 cm length covered stent.Percutaneous transluminal angioplasty of the posterior tibial artery and tibioperoneal trunk with 3 mm diameter by 30 cm length angioplasty balloon Percutaneous transluminal angioplasty of the left peroneal artery with 2.5 mm diameter by 10 cm length angioplasty  2.Hypertension -Continue atenolol.  3.Hyperlipidemia -On statin  4.Chronic kidney disease stage III  Physical therapy recommending rehab. Social worker for discharge planning--awaiting insurance approval    CONSULTS OBTAINED:  Treatment Team:  [name] [name], [name] S, MD  DRUG ALLERGIES:   Allergies  Allergen Reactions  . Aspirin Anaphylaxis, Swelling and Shortness Of Breath    REACTION: terrible bp reaction/wheezing  . Bee Pollen Other (See Comments)    ragweed  . Pollen Extract     ragweed  . Rabbit Epithelium     rabbits    DISCHARGE MEDICATIONS:   Current Discharge Medication List    START taking these medications   Details  apixaban (ELIQUIS) 5 MG TABS tablet Take 2 tablets (10 mg total) by mouth 2 (two) times daily. Till 06/02/17 And then 5 mg bid from 06/03/17 Qty: 60 tablet, Refills: 0    oxyCODONE-acetaminophen (PERCOCET/ROXICET) 5-325 MG tablet Take 1-2 tablets by mouth every 4 (four) hours as needed for moderate pain. Qty: 20 tablet, Refills: 0    senna-docusate (SENOKOT-[name]) 8.6-50 MG tablet Take 1 tablet by mouth at bedtime as needed for mild constipation. Qty: 30 tablet, Refills: 0      CONTINUE these medications which have NOT CHANGED   Details  albuterol (PROVENTIL HFA;VENTOLIN HFA) 108 (90 Base) MCG/ACT inhaler Inhale 2 puffs into  the lungs every 4 (four) hours as needed for wheezing or shortness of breath. Qty: 1 Inhaler, Refills: 1    atenolol (TENORMIN) 50 MG tablet TAKE 1 TABLET (50 MG TOTAL) BY MOUTH DAILY. Qty: 90  tablet, Refills: 1   Associated Diagnoses: Hypertension, goal below 140/90    Cholecalciferol (VITAMIN D-1000 MAX ST) 1000 units tablet Take 5 tablets by mouth daily.    donepezil (ARICEPT) 5 MG tablet Take 1 tablet by mouth every evening. Refills: 6    loratadine (CLARITIN) 10 MG tablet Take 1 tablet (10 mg total) by mouth daily as needed for allergies.    Omega-3 Fatty Acids (FISH OIL PO) Take 1 capsule by mouth 2 (two) times daily.    rosuvastatin (CRESTOR) 10 MG tablet Take 1 tablet (10 mg total) by mouth daily. Qty: 90 tablet, Refills: 3    tiZANidine (ZANAFLEX) 2 MG tablet Take 2 mg by mouth every 6 (six) hours. Refills: 1    vitamin B-12 (CYANOCOBALAMIN) 1000 MCG tablet Take 1 tablet by mouth daily.        If you experience worsening of your admission symptoms, develop shortness of breath, life threatening emergency, suicidal or homicidal thoughts you must seek medical attention immediately by calling 911 or calling your MD immediately  if symptoms less severe.  You Must read complete instructions/literature along with all the possible adverse reactions/side effects for all the Medicines you take and that have been prescribed to you. Take any new Medicines after you have completely understood and accept all the possible adverse reactions/side effects.   Please note  You were cared for by a hospitalist during your hospital stay. If you have any questions about your discharge medications or the care you received while you were in the hospital after you are discharged, you can call the unit and asked to speak with the hospitalist on call if the hospitalist that took care of you is not available. Once you are discharged, your primary care physician will handle any further medical issues. Please note that NO REFILLS for any discharge medications will be authorized once you are discharged, as it is imperative that you return to your primary care physician (or establish a relationship  with a primary care physician if you do not have one) for your aftercare needs so that they can reassess your need for medications and monitor your lab values. Today   SUBJECTIVE   Left foot pain  VITAL SIGNS:  Blood pressure (!) 127/52, pulse 65, temperature 98.3 F (36.8 C), temperature source Oral, resp. rate 20, height 5\' 3"  (1.6 m), weight 68 kg (150 lb), SpO2 95 %.  I/O:    Intake/Output Summary (Last 24 hours) at 05/28/2017 0840 Last data filed at 05/28/2017 0500 Gross per 24 hour  Intake 600 ml  Output 250 ml  Net 350 ml    PHYSICAL EXAMINATION:  GENERAL:  83 y.o.-year-old patient lying in the bed with no acute distress.  EYES: Pupils equal, round, reactive to light and accommodation. No scleral icterus. Extraocular muscles intact.  HEENT: Head atraumatic, normocephalic. Oropharynx and nasopharynx clear.  NECK:  Supple, no jugular venous distention. No thyroid enlargement, no tenderness.  LUNGS: Normal breath sounds bilaterally, no wheezing, rales,rhonchi or crepitation. No use of accessory muscles of respiration.  CARDIOVASCULAR: S1, S2 normal. No murmurs, rubs, or gallops.  ABDOMEN: Soft, non-tender, non-distended. Bowel sounds present. No organomegaly or mass.  EXTREMITIES:.Left 5th toe gangrene  NEUROLOGIC: Cranial nerves II through XII are intact. Muscle strength  5/5 in all extremities. Sensation intact. Gait not checked.  PSYCHIATRIC: The patient is alert and oriented x 3.  SKIN: No obvious rash, lesion, or ulcer.   DATA REVIEW:   CBC  Recent Labs  Lab 05/27/17 0759  WBC 10.3  HGB 12.6*  HCT 37.3*  PLT 183    Chemistries  Recent Labs  Lab 05/23/17 1917  05/26/17 0459  NA 138   < > 135  K 3.8   < > 3.9  CL 103   < > 101  CO2 24   < > 20*  GLUCOSE 112*   < > 76  BUN 23*   < > 18  CREATININE 1.15   < > 1.19  CALCIUM 9.2   < > 9.0  AST 48*  --   --   ALT 43  --   --   ALKPHOS 86  --   --   BILITOT 0.9  --   --    < > = values in this interval  not displayed.    Microbiology Results   Recent Results (from the past 240 hour([name]))  Surgical PCR screen     Status: None   Collection Time: 05/25/17  7:32 PM  Result Value Ref Range Status   MRSA, PCR NEGATIVE NEGATIVE Final   Staphylococcus aureus NEGATIVE NEGATIVE Final    Comment: (NOTE) The Xpert SA Assay (FDA approved for NASAL specimens in patients 81 years of age and older), is one component of a comprehensive surveillance program. It is not intended to diagnose infection nor to guide or monitor treatment.     RADIOLOGY:  No results found.   Management plans discussed with the patient, family and they are in agreement.  CODE STATUS:     Code Status Orders  (From admission, onward)        Start     Ordered   05/24/17 0016  Full code  Continuous     05/24/17 0015    Code Status History    Date Active Date Inactive Code Status Order ID Comments User Context   This patient has a current code status but no historical code status.    Advance Directive Documentation     Most Recent Value  Type of Advance Directive  Healthcare Power of Attorney, Living will  Pre-existing out of facility DNR order (yellow form or pink MOST form)  No data  "MOST" Form in Place?  No data      TOTAL TIME TAKING CARE OF THIS PATIENT: *40* minutes.    Enedina FinnerSona Elide Stalzer M.D on 05/28/2017 at 8:40 AM  Between 7am to 6pm - Pager - [phone] After 6pm go to [email or web address] - Social research officer, governmentpassword [email or web address]  [facility]  Office  [phone]  CC: Primary care physician; [name] PasseyLada, Melinda P, MD

## 2017-05-28 NOTE — Telephone Encounter (Signed)
I spoke with patient's wife He is going to rehab; he will be going to a skilled nursing facility, ambulance will transport him I'll be praying for him We'll be glad to see him after his trip to rehab  Okay to cancel appt with me on Dec 12th; we'll see him when needed after his stay in rehab

## 2017-05-28 NOTE — Clinical Social Work Note (Signed)
CSW received message from [place] at [facility] that they have received authorization from [employer] and patient can come to them this afternoon. Patient's nurse to call report and EMS. York SpanielMonica Trusten Hume MSW,LCSW [phone]

## 2017-06-01 ENCOUNTER — Other Ambulatory Visit: Payer: Self-pay | Admitting: Family Medicine

## 2017-06-01 DIAGNOSIS — I10 Essential (primary) hypertension: Secondary | ICD-10-CM

## 2017-06-01 MED ORDER — ROSUVASTATIN CALCIUM 10 MG PO TABS: 10.0000 mg | ORAL_TABLET | Freq: Every day | ORAL | 3 refills | Status: DC

## 2017-06-01 NOTE — Telephone Encounter (Signed)
He needs to be on 10 mg crestor, not 5 New Rx sent in

## 2017-06-11 ENCOUNTER — Ambulatory Visit: Payer: Medicare HMO | Admitting: Family Medicine

## 2017-06-13 ENCOUNTER — Ambulatory Visit (INDEPENDENT_AMBULATORY_CARE_PROVIDER_SITE_OTHER): Payer: Medicare HMO | Admitting: Vascular Surgery

## 2017-06-13 ENCOUNTER — Encounter (INDEPENDENT_AMBULATORY_CARE_PROVIDER_SITE_OTHER): Payer: Self-pay | Admitting: Vascular Surgery

## 2017-06-13 VITALS — BP 140/70 | HR 80 | Resp 18 | Ht 60.0 in | Wt 145.0 lb

## 2017-06-13 DIAGNOSIS — I82402 Acute embolism and thrombosis of unspecified deep veins of left lower extremity: Secondary | ICD-10-CM | POA: Diagnosis not present

## 2017-06-13 DIAGNOSIS — N183 Chronic kidney disease, stage 3 unspecified: Secondary | ICD-10-CM

## 2017-06-13 DIAGNOSIS — I739 Peripheral vascular disease, unspecified: Secondary | ICD-10-CM | POA: Diagnosis not present

## 2017-06-13 DIAGNOSIS — I1310 Hypertensive heart and chronic kidney disease without heart failure, with stage 1 through stage 4 chronic kidney disease, or unspecified chronic kidney disease: Secondary | ICD-10-CM | POA: Diagnosis not present

## 2017-06-13 DIAGNOSIS — I70262 Atherosclerosis of native arteries of extremities with gangrene, left leg: Secondary | ICD-10-CM | POA: Diagnosis not present

## 2017-06-13 DIAGNOSIS — E785 Hyperlipidemia, unspecified: Secondary | ICD-10-CM

## 2017-06-13 DIAGNOSIS — I10 Essential (primary) hypertension: Secondary | ICD-10-CM | POA: Diagnosis not present

## 2017-06-13 DIAGNOSIS — I70269 Atherosclerosis of native arteries of extremities with gangrene, unspecified extremity: Secondary | ICD-10-CM | POA: Insufficient documentation

## 2017-06-13 DIAGNOSIS — I96 Gangrene, not elsewhere classified: Secondary | ICD-10-CM | POA: Diagnosis not present

## 2017-06-13 NOTE — Assessment & Plan Note (Signed)
No apparent deterioration after procedure

## 2017-06-13 NOTE — Progress Notes (Signed)
MRN : [id number]  [name] [name] is a 83 y.o. (05/09/1928) male who presents with chief complaint of  Chief Complaint  Patient presents with  . Follow-up    2 weeks [facility]  .  History of Present Illness: Patient returns today in follow up of PAD with gangrene.  His foot looks much better and no longer appears profoundly ischemic.  His left fifth toe is still cyanotic with a large ulceration.  There is a smaller ulceration on the left great toe.  The foot is warm.  He had no periprocedural complications.  He is being discharged home from rehab tomorrow  Current Outpatient Medications  Medication Sig Dispense Refill  . albuterol (PROVENTIL HFA;VENTOLIN HFA) 108 (90 Base) MCG/ACT inhaler Inhale 2 puffs into the lungs every 4 (four) hours as needed for wheezing or shortness of breath. 1 Inhaler 1  . apixaban (ELIQUIS) 5 MG TABS tablet Take 2 tablets (10 mg total) by mouth 2 (two) times daily. Till 06/02/17 And then 5 mg bid from 06/03/17 60 tablet 0  . atenolol (TENORMIN) 50 MG tablet TAKE 1 TABLET (50 MG TOTAL) BY MOUTH DAILY. 90 tablet 1  . atorvastatin (LIPITOR) 10 MG tablet Take by mouth.    . Cholecalciferol (VITAMIN D-1000 MAX ST) 1000 units tablet Take 5 tablets by mouth daily.    Marland Kitchen donepezil (ARICEPT) 5 MG tablet Take 1 tablet by mouth every evening.  6  . loratadine (CLARITIN) 10 MG tablet Take 1 tablet (10 mg total) by mouth daily as needed for allergies.    . Omega-3 Fatty Acids (FISH OIL PO) Take 1 capsule by mouth 2 (two) times daily.    Marland Kitchen oxyCODONE-acetaminophen (PERCOCET/ROXICET) 5-325 MG tablet Take 1-2 tablets by mouth every 4 (four) hours as needed for moderate pain. 20 tablet 0  . rosuvastatin (CRESTOR) 10 MG tablet Take 1 tablet (10 mg total) by mouth daily. 90 tablet 3  . senna-docusate (SENOKOT-S) 8.6-50 MG tablet Take 1 tablet by mouth at bedtime as needed for mild constipation. 30 tablet 0  . tiZANidine (ZANAFLEX) 2 MG tablet Take 2 mg by mouth every 6 (six) hours.  1    . vitamin B-12 (CYANOCOBALAMIN) 1000 MCG tablet Take 1 tablet by mouth daily.     No current facility-administered medications for this visit.     Past Medical History:  Diagnosis Date  . Arthritis   . CKD (chronic kidney disease) stage 3, GFR 30-59 ml/min (HCC) 12/29/2015  . Hyperlipidemia   . Hypertension   . Left leg DVT ([place]) 01/2014  . Myocardial infarction ([place])   . Pulmonary embolism ([place]) 01/2014  . Thoracic aorta atherosclerosis ([place]) 10/08/2016    Past Surgical History:  Procedure Laterality Date  . CORONARY STENT PLACEMENT  2006  . JOINT REPLACEMENT  2011 & 2012   both hips  . len replaced Left 2015  . LOWER EXTREMITY ANGIOGRAPHY N/A 05/26/2017   Procedure: Lower Extremity Angiography;  Surgeon: Algernon Huxley, MD;  Location: [facility];  Service: Cardiovascular;  Laterality: N/A;  . MELANOMA EXCISION     40 years ago    Social History Social History   Tobacco Use  . Smoking status: Former Smoker    Types: Pipe  . Smokeless tobacco: Never Used  Substance Use Topics  . Alcohol use: No    Alcohol/week: 0.0 oz  . Drug use: No      Family History Family History  Problem Relation Age of Onset  .  Arthritis Mother   . Arthritis Father   . Depression Father      Allergies  Allergen Reactions  . Aspirin Anaphylaxis, Swelling and Shortness Of Breath    REACTION: terrible bp reaction/wheezing  . Bee Pollen Other (See Comments)    ragweed  . Pollen Extract     ragweed  . Rabbit Epithelium     rabbits     REVIEW OF SYSTEMS (Negative unless checked)  Constitutional: _0 Weight loss  _1 Fever  _2 Chills Cardiac: _3 Chest pain   _4 Chest pressure   _5 Palpitations   _6 Shortness of breath when laying flat   _7 Shortness of breath at rest   _8 Shortness of breath with exertion. Vascular:  _9 Pain in legs with walking   _10 Pain in legs at rest   _11 Pain in legs when laying flat   _12 Claudication   _13 Pain in feet when walking  _14 Pain in feet at rest  _15 Pain in  feet when laying flat   _16 History of DVT   _17 Phlebitis   _18 Swelling in legs   _19 Varicose veins   _20 Non-healing ulcers Pulmonary:   _21 Uses home oxygen   _22 Productive cough   _23 Hemoptysis   _24 Wheeze  _25 COPD   _26 Asthma Neurologic:  _27 Dizziness  _28 Blackouts   _29 Seizures   _30 History of stroke   _31 History of TIA  _32 Aphasia   _33 Temporary blindness   _34 Dysphagia   _35 Weakness or numbness in arms   _36 Weakness or numbness in legs Musculoskeletal:  _37 Arthritis   _38 Joint swelling   _39 Joint pain   _40 Low back pain Hematologic:  _41 Easy bruising  _42 Easy bleeding   _43 Hypercoagulable state   _44 Anemic   Gastrointestinal:  _45 Blood in stool   _46 Vomiting blood  _47 Gastroesophageal reflux/heartburn   _48 Abdominal pain Genitourinary:  _49 Chronic kidney disease   _50 Difficult urination  _51 Frequent urination  _52 Burning with urination   _53 Hematuria Skin:  _54 Rashes   _55 Ulcers   _56 Wounds Psychological:  _57 History of anxiety   _58  History of major depression.  Physical Examination  BP 140/70 (BP Location: Left Arm, Patient Position: Sitting)   Pulse 80   Resp 18   Ht 5' (1.524 m)   Wt 65.8 kg (145 lb)   BMI 28.32 kg/m  Gen: Elderly and somewhat frail appearing, NAD Head: [place]/AT, No temporalis wasting. Ear/Nose/Throat: Hearing grossly intact, nares w/o erythema or drainage, trachea midline Eyes: Conjunctiva clear. Sclera non-icteric Neck: Supple.  No JVD.  Pulmonary:  Good air movement, no use of accessory muscles.  Cardiac: Irregular Vascular:  Vessel Right Left  Radial Palpable Palpable                          PT  1+ palpable  trace palpable  DP  1+ palpable  2+ palpable    Musculoskeletal: M/S 5/5 throughout.  No deformity or atrophy.  No significant edema.  Left foot as described above with ulcerations on toes 1 and 5 but overall reasonably warm with good capillary refill except for the tip of the first toe and the fifth toe. Neurologic: Sensation grossly intact in extremities.  Symmetrical.  Speech is  fluent.  Psychiatric: Judgment and insight seem fair at best Dermatologic: Left foot wound as above    Labs Recent Results (from the past 2160 hour(s))  CBC with Differential/Platelet     Status: None   Collection Time: 03/24/17 11:12 AM  Result Value Ref Range   WBC 8.0 3.8 - 10.8 Thousand/uL   RBC 5.21 4.20 - 5.80 Million/uL   Hemoglobin 15.5 13.2 -  17.1 g/dL   HCT 46.9 38.5 - 50.0 %   MCV 90.0 80.0 - 100.0 fL   MCH 29.8 27.0 - 33.0 pg   MCHC 33.0 32.0 - 36.0 g/dL   RDW 13.7 11.0 - 15.0 %   Platelets 171 140 - 400 Thousand/uL   MPV 9.3 7.5 - 12.5 fL   Neutro Abs 5,632 1,500 - 7,800 cells/uL   Lymphs Abs 1,440 850 - 3,900 cells/uL   WBC mixed population 688 200 - 950 cells/uL   Eosinophils Absolute 200 15 - 500 cells/uL   Basophils Absolute 40 0 - 200 cells/uL   Neutrophils Relative % 70.4 %   Total Lymphocyte 18.0 %   Monocytes Relative 8.6 %   Eosinophils Relative 2.5 %   Basophils Relative 0.5 %  COMPLETE METABOLIC PANEL WITH GFR     Status: Abnormal   Collection Time: 03/24/17 11:12 AM  Result Value Ref Range   Glucose, Bld 95 65 - 99 mg/dL    Comment: .            Fasting reference interval .    BUN 19 7 - 25 mg/dL   Creat 1.37 (H) 0.70 - 1.11 mg/dL    Comment: For patients >67 years of age, the reference limit for Creatinine is approximately 13% higher for people identified as African-American. .    GFR, Est Non African American 46 (L) > OR = 60 mL/min/1.4m   GFR, Est African American 54 (L) > OR = 60 mL/min/1.716m  BUN/Creatinine Ratio 14 6 - 22 (calc)   Sodium 141 135 - 146 mmol/L   Potassium 4.8 3.5 - 5.3 mmol/L   Chloride 104 98 - 110 mmol/L   CO2 29 20 - 32 mmol/L   Calcium 9.6 8.6 - 10.3 mg/dL   Total Protein 7.2 6.1 - 8.1 g/dL   Albumin 4.1 3.6 - 5.1 g/dL   Globulin 3.1 1.9 - 3.7 g/dL (calc)   AG Ratio 1.3 1.0 - 2.5 (calc)   Total Bilirubin 0.6 0.2 - 1.2 mg/dL   Alkaline phosphatase (APISO) 89 40 - 115 U/L   AST 12 10 - 35 U/L   ALT 11 9 -  46 U/L  Lipid panel     Status: Abnormal   Collection Time: 03/24/17 11:12 AM  Result Value Ref Range   Cholesterol 164 <200 mg/dL   HDL 39 (L) >40 mg/dL   Triglycerides 150 (H) <150 mg/dL   LDL Cholesterol (Calc) 100 (H) mg/dL (calc)    Comment: Reference range: <100 . Desirable range <100 mg/dL for primary prevention;   <70 mg/dL for patients with CHD or diabetic patients  with > or = 2 CHD risk factors. . Marland KitchenDL-C is now calculated using the Martin-Hopkins  calculation, which is a validated novel method providing  better accuracy than the Friedewald equation in the  estimation of LDL-C.  MaCresenciano Genret al. JAAnnamaria [id number]  (http://education.[employer].com/faq/FAQ164)    Total CHOL/HDL Ratio 4.2 <5.0 (calc)   Non-HDL Cholesterol (Calc) 125 <130 mg/dL (calc)    Comment: For patients with diabetes plus 1 major ASCVD risk  factor, treating to a non-HDL-C goal of <100 mg/dL  (LDL-C of <70 mg/dL) is considered a therapeutic  option.   Comprehensive metabolic panel     Status: Abnormal   Collection Time: 05/23/17  7:17 PM  Result Value Ref Range   Sodium 138 135 - 145 mmol/L   Potassium 3.8 3.5 - 5.1 mmol/L   Chloride 103  101 - 111 mmol/L   CO2 24 22 - 32 mmol/L   Glucose, Bld 112 (H) 65 - 99 mg/dL   BUN 23 (H) 6 - 20 mg/dL   Creatinine, Ser 1.15 0.61 - 1.24 mg/dL   Calcium 9.2 8.9 - 10.3 mg/dL   Total Protein 7.7 6.5 - 8.1 g/dL   Albumin 3.5 3.5 - 5.0 g/dL   AST 48 (H) 15 - 41 U/L   ALT 43 17 - 63 U/L   Alkaline Phosphatase 86 38 - 126 U/L   Total Bilirubin 0.9 0.3 - 1.2 mg/dL   GFR calc non Af Amer 56 (L) >60 mL/min   GFR calc Af Amer >60 >60 mL/min    Comment: (NOTE) The eGFR has been calculated using the CKD EPI equation. This calculation has not been validated in all clinical situations. eGFR's persistently <60 mL/min signify possible Chronic Kidney Disease.    Anion gap 11 5 - 15  CBC     Status: Abnormal   Collection Time: 05/23/17  7:25 PM    Result Value Ref Range   WBC 14.0 (H) 3.8 - 10.6 K/uL   RBC 4.63 4.40 - 5.90 MIL/uL   Hemoglobin 14.1 13.0 - 18.0 g/dL   HCT 42.8 40.0 - 52.0 %   MCV 92.4 80.0 - 100.0 fL   MCH 30.5 26.0 - 34.0 pg   MCHC 33.0 32.0 - 36.0 g/dL   RDW 14.4 11.5 - 14.5 %   Platelets 193 150 - 440 K/uL  Protime-INR     Status: None   Collection Time: 05/23/17 11:16 PM  Result Value Ref Range   Prothrombin Time 13.8 11.4 - 15.2 seconds   INR 1.07   APTT     Status: None   Collection Time: 05/23/17 11:16 PM  Result Value Ref Range   aPTT 29 24 - 36 seconds  Heparin level (unfractionated)     Status: Abnormal   Collection Time: 05/24/17  6:50 AM  Result Value Ref Range   Heparin Unfractionated 1.08 (H) 0.30 - 0.70 IU/mL    Comment:        IF HEPARIN RESULTS ARE BELOW EXPECTED VALUES, AND PATIENT DOSAGE HAS BEEN CONFIRMED, SUGGEST FOLLOW UP TESTING OF ANTITHROMBIN III LEVELS.   Basic metabolic panel     Status: Abnormal   Collection Time: 05/24/17  6:50 AM  Result Value Ref Range   Sodium 138 135 - 145 mmol/L   Potassium 3.7 3.5 - 5.1 mmol/L   Chloride 107 101 - 111 mmol/L   CO2 24 22 - 32 mmol/L   Glucose, Bld 101 (H) 65 - 99 mg/dL   BUN 17 6 - 20 mg/dL   Creatinine, Ser 1.10 0.61 - 1.24 mg/dL   Calcium 8.5 (L) 8.9 - 10.3 mg/dL   GFR calc non Af Amer 59 (L) >60 mL/min   GFR calc Af Amer >60 >60 mL/min    Comment: (NOTE) The eGFR has been calculated using the CKD EPI equation. This calculation has not been validated in all clinical situations. eGFR's persistently <60 mL/min signify possible Chronic Kidney Disease.    Anion gap 7 5 - 15  CBC     Status: Abnormal   Collection Time: 05/24/17  6:50 AM  Result Value Ref Range   WBC 9.9 3.8 - 10.6 K/uL   RBC 4.23 (L) 4.40 - 5.90 MIL/uL   Hemoglobin 13.0 13.0 - 18.0 g/dL   HCT 38.7 (L) 40.0 - 52.0 %   MCV 91.6  80.0 - 100.0 fL   MCH 30.8 26.0 - 34.0 pg   MCHC 33.6 32.0 - 36.0 g/dL   RDW 14.3 11.5 - 14.5 %   Platelets 163 150 - 440 K/uL   Heparin level (unfractionated)     Status: None   Collection Time: 05/24/17  5:22 PM  Result Value Ref Range   Heparin Unfractionated 0.42 0.30 - 0.70 IU/mL    Comment:        IF HEPARIN RESULTS ARE BELOW EXPECTED VALUES, AND PATIENT DOSAGE HAS BEEN CONFIRMED, SUGGEST FOLLOW UP TESTING OF ANTITHROMBIN III LEVELS.   Heparin level (unfractionated)     Status: None   Collection Time: 05/25/17  1:07 AM  Result Value Ref Range   Heparin Unfractionated 0.41 0.30 - 0.70 IU/mL    Comment:        IF HEPARIN RESULTS ARE BELOW EXPECTED VALUES, AND PATIENT DOSAGE HAS BEEN CONFIRMED, SUGGEST FOLLOW UP TESTING OF ANTITHROMBIN III LEVELS.   CBC     Status: None   Collection Time: 05/25/17  4:49 AM  Result Value Ref Range   WBC 9.2 3.8 - 10.6 K/uL   RBC 4.57 4.40 - 5.90 MIL/uL   Hemoglobin 14.1 13.0 - 18.0 g/dL   HCT 42.3 40.0 - 52.0 %   MCV 92.6 80.0 - 100.0 fL   MCH 30.7 26.0 - 34.0 pg   MCHC 33.2 32.0 - 36.0 g/dL   RDW 14.4 11.5 - 14.5 %   Platelets 174 150 - 440 K/uL  Surgical PCR screen     Status: None   Collection Time: 05/25/17  7:32 PM  Result Value Ref Range   MRSA, PCR NEGATIVE NEGATIVE   Staphylococcus aureus NEGATIVE NEGATIVE    Comment: (NOTE) The Xpert SA Assay (FDA approved for NASAL specimens in patients 13 years of age and older), is one component of a comprehensive surveillance program. It is not intended to diagnose infection nor to guide or monitor treatment.   Heparin level (unfractionated)     Status: Abnormal   Collection Time: 05/26/17  4:59 AM  Result Value Ref Range   Heparin Unfractionated 0.29 (L) 0.30 - 0.70 IU/mL    Comment:        IF HEPARIN RESULTS ARE BELOW EXPECTED VALUES, AND PATIENT DOSAGE HAS BEEN CONFIRMED, SUGGEST FOLLOW UP TESTING OF ANTITHROMBIN III LEVELS.   CBC     Status: None   Collection Time: 05/26/17  4:59 AM  Result Value Ref Range   WBC 8.6 3.8 - 10.6 K/uL   RBC 4.65 4.40 - 5.90 MIL/uL   Hemoglobin 14.4 13.0 - 18.0 g/dL    HCT 42.8 40.0 - 52.0 %   MCV 92.0 80.0 - 100.0 fL   MCH 31.0 26.0 - 34.0 pg   MCHC 33.7 32.0 - 36.0 g/dL   RDW 14.2 11.5 - 14.5 %   Platelets 175 150 - 440 K/uL  Protime-INR     Status: None   Collection Time: 05/26/17  4:59 AM  Result Value Ref Range   Prothrombin Time 13.4 11.4 - 15.2 seconds   INR 2.11   Basic metabolic panel     Status: Abnormal   Collection Time: 05/26/17  4:59 AM  Result Value Ref Range   Sodium 135 135 - 145 mmol/L   Potassium 3.9 3.5 - 5.1 mmol/L   Chloride 101 101 - 111 mmol/L   CO2 20 (L) 22 - 32 mmol/L   Glucose, Bld 76 65 - 99 mg/dL  BUN 18 6 - 20 mg/dL   Creatinine, Ser 1.19 0.61 - 1.24 mg/dL   Calcium 9.0 8.9 - 10.3 mg/dL   GFR calc non Af Amer 53 (L) >60 mL/min   GFR calc Af Amer >60 >60 mL/min    Comment: (NOTE) The eGFR has been calculated using the CKD EPI equation. This calculation has not been validated in all clinical situations. eGFR's persistently <60 mL/min signify possible Chronic Kidney Disease.    Anion gap 14 5 - 15  Heparin level (unfractionated)     Status: None   Collection Time: 05/26/17  1:01 PM  Result Value Ref Range   Heparin Unfractionated 0.46 0.30 - 0.70 IU/mL    Comment:        IF HEPARIN RESULTS ARE BELOW EXPECTED VALUES, AND PATIENT DOSAGE HAS BEEN CONFIRMED, SUGGEST FOLLOW UP TESTING OF ANTITHROMBIN III LEVELS.   Heparin level (unfractionated)     Status: None   Collection Time: 05/27/17 12:27 AM  Result Value Ref Range   Heparin Unfractionated 0.36 0.30 - 0.70 IU/mL    Comment:        IF HEPARIN RESULTS ARE BELOW EXPECTED VALUES, AND PATIENT DOSAGE HAS BEEN CONFIRMED, SUGGEST FOLLOW UP TESTING OF ANTITHROMBIN III LEVELS.   Heparin level (unfractionated)     Status: None   Collection Time: 05/27/17  7:59 AM  Result Value Ref Range   Heparin Unfractionated 0.33 0.30 - 0.70 IU/mL    Comment:        IF HEPARIN RESULTS ARE BELOW EXPECTED VALUES, AND PATIENT DOSAGE HAS BEEN CONFIRMED, SUGGEST  FOLLOW UP TESTING OF ANTITHROMBIN III LEVELS.   CBC     Status: Abnormal   Collection Time: 05/27/17  7:59 AM  Result Value Ref Range   WBC 10.3 3.8 - 10.6 K/uL   RBC 4.09 (L) 4.40 - 5.90 MIL/uL   Hemoglobin 12.6 (L) 13.0 - 18.0 g/dL   HCT 37.3 (L) 40.0 - 52.0 %   MCV 91.3 80.0 - 100.0 fL   MCH 30.7 26.0 - 34.0 pg   MCHC 33.6 32.0 - 36.0 g/dL   RDW 14.5 11.5 - 14.5 %   Platelets 183 150 - 440 K/uL    Radiology Dg Pelvis 1-2 Views  Result Date: 05/23/2017 CLINICAL DATA:  Left leg pain after fall in shower 2 days ago. EXAM: PELVIS - 1-2 VIEW COMPARISON:  02/19/2010 FINDINGS: L4-5 and L5-S1 facet and degenerative disc disease. Intact bony pelvis with bilateral press fit arthroplasties of both hips. No prosthetic dislocation or evidence of hardware failure. The femoral components are cemented. Extensive bi-iliofemoral atherosclerosis. IMPRESSION: 1. Lower lumbar degenerative disc and facet arthropathy of the included lumbar spine. 2. No acute osseous abnormality of the bony pelvis. Electronically Signed   By: Ashley Royalty M.D.   On: 05/23/2017 18:15   Dg Tibia/fibula Left  Result Date: 05/23/2017 CLINICAL DATA:  Left leg pain after fall in shower 2 days ago. EXAM: LEFT TIBIA AND FIBULA - 2 VIEW COMPARISON:  None. FINDINGS: Tricompartmental osteoarthritis of the left knee. Intact ankle joint. No acute fracture, joint dislocation or suspicious osseous lesions. Extensive popliteal through tibial arteriosclerosis. No significant soft tissue tissue swelling. IMPRESSION: Tricompartmental osteoarthritis of the knee. No acute fracture of the tibia nor fibula. Electronically Signed   By: Ashley Royalty M.D.   On: 05/23/2017 18:16   Ct Angio Ao+bifem W & Or Wo Contrast  Result Date: 05/23/2017 CLINICAL DATA:  Decreased pulse in left leg. Sudden onset of left  leg pain. Patient reports recent fall in the shower. EXAM: CT ANGIOGRAPHY OF ABDOMINAL AORTA WITH ILIOFEMORAL RUNOFF TECHNIQUE: Multidetector CT  imaging of the abdomen, pelvis and lower extremities was performed using the standard protocol during bolus administration of intravenous contrast. Multiplanar CT image reconstructions and MIPs were obtained to evaluate the vascular anatomy. CONTRAST:  129m ISOVUE-370 IOPAMIDOL (ISOVUE-370) INJECTION 76% COMPARISON:  None. FINDINGS: VASCULAR Aorta: Moderate calcified and noncalcified atheromatous plaque without aneurysm, dissection or periaortic stranding. Scattered luminal stenosis of less than 25%. Celiac: Calcified plaque at the origin with less than 50% stenosis, mild poststenotic dilatation. SMA: Mild plaque at the origin without significant stenosis. Replaced hepatic artery is incidentally noted arising from the SMA. Renals: Plaque at the origin without significant stenosis. No evidence of vasculitis or fibromuscular dysplasia. IMA: Patent with plaque at the origin. RIGHT Lower Extremity Inflow: Common, internal and external iliac arteries are patent without evidence of aneurysm, dissection, vasculitis or significant stenosis. Mild calcified plaque. Outflow: Common, superficial and profunda femoral arteries and the popliteal artery are patent without evidence of aneurysm, dissection, vasculitis or significant stenosis. Mild calcified and noncalcified plaque in the proximal common femoral, and throughout the superficial femoral artery. Runoff: Moderate calcified plaque of the popliteal artery. Posterior tibial and peroneal arteries are patent to the mid lower leg, small in caliber. No flow in the distal peroneal artery. Densely calcified anterior tibial artery which may be occluded proximally, however reconstitutes distally at the ankle. LEFT Lower Extremity Inflow: Common, internal and external iliac arteries are patent without evidence of aneurysm, dissection, vasculitis or significant stenosis. Mild calcified plaque. Outflow: Common, superficial and profunda femoral arteries and the popliteal artery are  patent without evidence of aneurysm, dissection, vasculitis or significant stenosis. Calcified and noncalcified plaques throughout the SFA, with plaque distally causing moderate stenosis. Abrupt occlusion of the distal SFA at the level of the mid distal femur extending to the popliteal artery. Curvilinear density in the periphery of the distal most SFA may be trace reconstitution versus calcific plaque. Mild stranding about the distal SFA. Runoff: Reconstitution of the anterior tibial artery and tibioperoneal trunk with some flow to the ankle. Possible embolic disease with filling defects in the proximal posterior tibial and peroneal arteries and anterior tibial artery at the level of the ankle. Veins: No obvious venous abnormality within the limitations of this arterial phase study. Review of the MIP images confirms the above findings. NON-VASCULAR Lower chest: Linear atelectasis in the left lower lobe. The heart is normal in size. Hepatobiliary: No focal lesion allowing for arterial phase imaging. Possible small stones or sludge in the gallbladder without abnormal gallbladder distention. Pancreas: Motion artifact through the pancreas, no obvious ductal dilatation or inflammation. Spleen: Normal in size and arterial phase imaging. Splenule superiorly. Adrenals/Urinary Tract: No definite adrenal nodule. Mild thinning of renal parenchyma without hydronephrosis. Homogeneous renal enhancement. Urinary bladder is physiologically distended. Stomach/Bowel: Stomach is decompressed. No bowel dilatation or inflammation. Rather extensive colonic diverticulosis from the splenic flexure distally. No diverticulitis. Lymphatic: Small paraesophageal lymph nodes adjacent of the distal esophagus, of uncertain etiology. No additional enlarged lymph nodes in the abdomen or pelvis. Reproductive: Prostate gland obscured by streak artifact from bilateral hip prostheses. Other: No ascites.  No free air. Musculoskeletal: Bilateral hip  arthroplasties. T11 compression fracture appears remote. Multilevel degenerative change in the lumbar spine. No acute osseous abnormalities in the lower extremities. IMPRESSION: VASCULAR 1. Total or near total occlusion of the distal left superficial femoral artery extending through the popliteal.  Distal reconstitution of the calf vessels with intraluminal filling defects in the calf vessels suspicious for embolic disease. 2. Absent flow in the distal right peroneal artery, of uncertain acuity. 3. Calcified and noncalfied atheromatous plaque throughout the abdominal aorta and lower extremity vasculature, severe in bilateral trifurcations. NON-VASCULAR 1. No acute abnormality. 2. Colonic diverticulosis. Possible cholelithiasis or sludge in the gallbladder without complicating features. These results were called by telephone at the time of interpretation on 05/23/2017 at 9:22 pm to [place] , who verbally acknowledged these results. Electronically Signed   By: Jeb Levering M.D.   On: 05/23/2017 21:23   Dg Femur Min 2 Views Left  Result Date: 05/23/2017 CLINICAL DATA:  Left leg pain after fall in shower 2 days ago. EXAM: LEFT FEMUR 2 VIEWS COMPARISON:  None. FINDINGS: Tricompartmental osteoarthritis the knee. Press-Fit left hip arthroplasty with cemented femoral component. No acute fracture, suspicious osseous lesion nor joint dislocation. Slightly shallow appearing acetabular component that may predispose patient to joint dislocations. IMPRESSION: 1. No acute osseous abnormality of the left femur. 2. Tricompartmental osteoarthritis of the included left knee. 3. Intact left cemented Press-Fit arthroplasty of the hip. Electronically Signed   By: Ashley Royalty M.D.   On: 05/23/2017 18:18    Assessment/Plan  Hypertension goal BP (blood pressure) < 140/90 blood pressure control important in reducing the progression of atherosclerotic disease. On appropriate oral medications.   CKD (chronic kidney  disease) stage 3, GFR 30-59 ml/min No apparent deterioration after procedure  Hyperlipidemia LDL goal <70 lipid control important in reducing the progression of atherosclerotic disease. Continue statin therapy   Atherosclerosis of native arteries of the extremities with gangrene [facility]) The patient has been revascularized and now has a strongly palpable pulse and marked improvement in his foot.  I think he is still going to lose his fifth toe and I have recommended he follow back up with his podiatrist.  I will see him back in about 2-3 months with noninvasive studies.  He will contact our office with any problems in the interim.  Continue current medical regimen including Eliquis and statin agent    Leotis Pain, MD  06/13/2017 3:20 PM    This note was created with Dragon medical transcription system.  Any errors from dictation are purely unintentional

## 2017-06-13 NOTE — Patient Instructions (Signed)

## 2017-06-13 NOTE — Assessment & Plan Note (Signed)
lipid control important in reducing the progression of atherosclerotic disease. Continue statin therapy  

## 2017-06-13 NOTE — Assessment & Plan Note (Signed)
The patient has been revascularized and now has a strongly palpable pulse and marked improvement in his foot.  I think he is still going to lose his fifth toe and I have recommended he follow back up with his podiatrist.  I will see him back in about 2-3 months with noninvasive studies.  He will contact our office with any problems in the interim.  Continue current medical regimen including Eliquis and statin agent

## 2017-06-13 NOTE — Assessment & Plan Note (Signed)
blood pressure control important in reducing the progression of atherosclerotic disease. On appropriate oral medications.  

## 2017-06-14 ENCOUNTER — Ambulatory Visit
Admission: RE | Admit: 2017-06-14 | Discharge: 2017-06-14 | Disposition: A | Discharge status: Home / Self Care | Payer: Medicare HMO | Source: Ambulatory Visit | Attending: Family Medicine | Admitting: Family Medicine

## 2017-06-14 ENCOUNTER — Telehealth: Payer: Self-pay | Admitting: Family Medicine

## 2017-06-14 ENCOUNTER — Ambulatory Visit: Payer: Medicare HMO | Admitting: Family Medicine

## 2017-06-14 ENCOUNTER — Encounter: Payer: Self-pay | Admitting: Family Medicine

## 2017-06-14 ENCOUNTER — Other Ambulatory Visit
Admission: RE | Admit: 2017-06-14 | Discharge: 2017-06-14 | Disposition: A | Discharge status: Home / Self Care | Payer: Medicare HMO | Source: Ambulatory Visit | Attending: Family Medicine | Admitting: Family Medicine

## 2017-06-14 VITALS — BP 130/68 | HR 87 | Temp 98.3°F

## 2017-06-14 DIAGNOSIS — Z87891 Personal history of nicotine dependence: Secondary | ICD-10-CM | POA: Diagnosis not present

## 2017-06-14 DIAGNOSIS — R05 Cough: Secondary | ICD-10-CM

## 2017-06-14 DIAGNOSIS — I70262 Atherosclerosis of native arteries of extremities with gangrene, left leg: Secondary | ICD-10-CM

## 2017-06-14 DIAGNOSIS — R918 Other nonspecific abnormal finding of lung field: Secondary | ICD-10-CM | POA: Diagnosis not present

## 2017-06-14 DIAGNOSIS — R059 Cough, unspecified: Secondary | ICD-10-CM

## 2017-06-14 DIAGNOSIS — J159 Unspecified bacterial pneumonia: Secondary | ICD-10-CM

## 2017-06-14 LAB — CBC WITH DIFFERENTIAL/PLATELET
BASOS PCT: 0 %
Basophils Absolute: 0 10*3/uL (ref 0–0.1)
EOS ABS: 0 10*3/uL (ref 0–0.7)
EOS PCT: 0 %
HCT: 38.9 % — ABNORMAL LOW (ref 40.0–52.0)
HEMOGLOBIN: 12.8 g/dL — AB (ref 13.0–18.0)
LYMPHS ABS: 0.5 10*3/uL — AB (ref 1.0–3.6)
Lymphocytes Relative: 4 %
MCH: 30.1 pg (ref 26.0–34.0)
MCHC: 33 g/dL (ref 32.0–36.0)
MCV: 91.1 fL (ref 80.0–100.0)
Monocytes Absolute: 0.3 10*3/uL (ref 0.2–1.0)
Monocytes Relative: 3 %
NEUTROS PCT: 93 %
Neutro Abs: 11.7 10*3/uL — ABNORMAL HIGH (ref 1.4–6.5)
PLATELETS: 336 10*3/uL (ref 150–440)
RBC: 4.27 MIL/uL — AB (ref 4.40–5.90)
RDW: 14.1 % (ref 11.5–14.5)
WBC: 12.5 10*3/uL — AB (ref 3.8–10.6)

## 2017-06-14 LAB — COMPREHENSIVE METABOLIC PANEL
ALBUMIN: 2.6 g/dL — AB (ref 3.5–5.0)
ALT: 358 U/L — AB (ref 17–63)
ANION GAP: 12 (ref 5–15)
AST: 820 U/L — ABNORMAL HIGH (ref 15–41)
Alkaline Phosphatase: 184 U/L — ABNORMAL HIGH (ref 38–126)
BUN: 22 mg/dL — ABNORMAL HIGH (ref 6–20)
CHLORIDE: 96 mmol/L — AB (ref 101–111)
CO2: 24 mmol/L (ref 22–32)
Calcium: 9.2 mg/dL (ref 8.9–10.3)
Creatinine, Ser: 1.18 mg/dL (ref 0.61–1.24)
GFR calc non Af Amer: 54 mL/min — ABNORMAL LOW (ref >60)
GLUCOSE: 159 mg/dL — AB (ref 65–99)
Potassium: 4.3 mmol/L (ref 3.5–5.1)
SODIUM: 132 mmol/L — AB (ref 135–145)
Total Bilirubin: 1 mg/dL (ref 0.3–1.2)
Total Protein: 8 g/dL (ref 6.5–8.1)

## 2017-06-14 MED ORDER — LEVOFLOXACIN 500 MG PO TABS: 500.0000 mg | ORAL_TABLET | Freq: Every day | ORAL | 0 refills | Status: DC

## 2017-06-14 MED ORDER — BENZONATATE 100 MG PO CAPS: 100.0000 mg | ORAL_CAPSULE | Freq: Three times a day (TID) | ORAL | 0 refills | Status: DC | PRN

## 2017-06-14 NOTE — Patient Instructions (Addendum)
Cough, Adult A cough helps to clear your throat and lungs. A cough may last only 2-3 weeks (acute), or it may last longer than 8 weeks (chronic). Many different things can cause a cough. A cough may be a sign of an illness or another medical condition. Follow these instructions at home:  Pay attention to any changes in your cough.  Take medicines only as told by your doctor. ? If you were prescribed an antibiotic medicine, take it as told by your doctor. Do not stop taking it even if you start to feel better. ? Talk with your doctor before you try using a cough medicine.  Drink enough fluid to keep your pee (urine) clear or pale yellow.  If the air is dry, use a cold steam vaporizer or humidifier in your home.  Stay away from things that make you cough at work or at home.  If your cough is worse at night, try using extra pillows to raise your head up higher while you sleep.  Do not smoke, and try not to be around smoke. If you need help quitting, ask your doctor.  Do not have caffeine.  Do not drink alcohol.  Rest as needed. Contact a doctor if:  You have new problems (symptoms).  You cough up yellow fluid (pus).  Your cough does not get better after 2-3 weeks, or your cough gets worse.  Medicine does not help your cough and you are not sleeping well.  You have pain that gets worse or pain that is not helped with medicine.  You have a fever.  You are losing weight and you do not know why.  You have night sweats. Get help right away if:  You cough up blood.  You have trouble breathing.  Your heartbeat is very fast. This information is not intended to replace advice given to you by your health care provider. Make sure you discuss any questions you have with your health care provider. Document Released: 02/28/2011 Document Revised: 11/23/2015 Document Reviewed: 08/24/2014 Elsevier Interactive Patient Education  2018 [employer]  Gangrene Gangrene is a medical  condition caused by a lack of blood supply to an area of your body. Oxygen travels through your blood, so less blood means less oxygen. Without oxygen, your body tissue will start to die. Gangrene can affect many parts of your body. Gangrene is most common on the skin (external gangrene), but it can also affect internal parts of the body (internal gangrene). Gangrene requires emergency treatment. What are the causes? Any condition that cuts off blood supply can cause gangrene. Common causes include:  Injuries.  Blood vessel disease.  Diabetes.  Infections.  What increases the risk? You may be at increased risk for gangrene if you have:  A serious injury.  Recent surgery.  Diabetes.  A weak body defense system (immune system).  Narrowing of your arteries (arteriosclerosis).  An immune system disease that causes your arteries to constrict (Raynaud disease).  A history of IV drug use.  What are the signs or symptoms? The signs and symptoms depend on what part of your body is affected. If you have external gangrene, you may have:  Severe pain followed by loss of feeling.  Redness and swelling.  Crackling sounds when you press on a swollen area of skin.  A wound with bad-smelling drainage.  Changes in the color of your skin. It may turn red, blue, black, or white.  Fever and chills.  If you have internal gangrene, you  may have:  Fever and chills.  Confusion.  Dizziness.  Severe pain.  Rapid heartbeat and breathing.  Loss of appetite.  Nausea or vomiting.  How is this diagnosed? To diagnose gangrene, your health care provider will take your medical history and do a physical exam. Tests may also be done to help in making the diagnosis. These may include:  X-rays of your blood vessels after injection of a dye (arteriogram).  Blood tests to look for an infection in your blood.  Imaging studies such as a CT scan or MRI.  Taking a swab from a draining wound  to look for bacteria in the laboratory (culture).  Taking a piece of tissue (biopsy) to look for cell death in the laboratory.  A surgical procedure to look for gangrene inside your body.  How is this treated? Treatment for gangrene depends on the cause and the area of your body that is affected. Gangrene is usually treated in the hospital. It is very important to start treatment early before gangrene spreads. Treatment may include the following:  Medicine. You may get high doses of antibiotic medicine for gangrene caused by infection. The antibiotics may be given directly into a vein through an IV tube.  Surgery. Surgical options may include: ? Debridement. This is surgery to remove dead tissue. You may need to have debridement several times. ? Bypass or angioplasty. This surgery improves the blood supply to an affected area. ? Amputation. Sometimes it is necessary to remove a body part.  Oxygen therapy. This involves treatment in a chamber designed to provide high levels of oxygen (hyperbaric oxygen therapy). It can improve the oxygen supply to an affected area.  Supportive care. This may include: ? IV fluids and nutrients. ? Pain medicines. ? Blood thinners to prevent blood clots.  Follow these instructions at home:  Take medicines only as directed by your health care provider.  If you were prescribed an antibiotic medicine, finish it all even if you start to feel better.  Clean any cuts or scratches with an antiseptic.  Cover any open wounds.  Check wounds for signs of infection. Watch for redness or swelling.  Do not use any tobacco products, including cigarettes, chewing tobacco, or electronic cigarettes. If you need help quitting, ask your health care provider.  Limit alcohol intake to no more than 1 drink per day for nonpregnant women and 2 drinks per day for men. One drink equals 12 ounces of beer, 5 ounces of wine, or 1 ounces of hard liquor.  Eat a healthy diet and  maintain a healthy weight.  Get some exercise on most days of the week. Ask your health care provider to suggest activities that are safe for you.  If you have diabetes or another blood vessel disease, check your feet often for signs of injury or infection.  After any surgery you have, follow your health care provider's instructions carefully.  Keep all follow-up visits as directed by your health care provider. This is important. Contact a health care provider if:  You have a wound or sore that is not healing.  You have color changes (white, red, blue, or black) in an area of your skin.  You have a wound or sore with smelly drainage. Get help right away if:  You have rapidly worsening pain at the site of a skin infection or wound.  You lose sensation at the site of a skin infection or wound.  You have unexplained: ? Fever. ? Chills. ? Confusion. ?  Fainting. This information is not intended to replace advice given to you by your health care provider. Make sure you discuss any questions you have with your health care provider. Document Released: 04/18/2004 Document Revised: 11/23/2015 Document Reviewed: 08/11/2013 Elsevier Interactive Patient Education  [employer]2018 [employer]

## 2017-06-14 NOTE — Telephone Encounter (Signed)
WBC elevated, discussed patient case with Dr. Carlynn PurlSowles, and is in agreement that we will treat empirically for PNA. Follow up with Dr. Sherie DonLada in 5 days. Kidney function from 05/26/2017 reviewed and we will start pt on Levaquin. CMP and CXR final reading are still pending.  LMOM to call back - unable to leave detailed message due to no DPR signed. Levaquin Rx sent to [facility] [place] [address].

## 2017-06-14 NOTE — Progress Notes (Signed)
Name: [name] [name]   MRN: [id number]    DOB: [birth year]   Date:06/14/2017       Progress Note  Subjective  Chief Complaint  Chief Complaint  Patient presents with  . Cough    HPI  - Cough: Pt presents with daughter who also contributes to history.  Pt has had ongoing non-productive for several years - slightly worse over the last few weeks. Daughter is concerned that since he has been staying in rehab for about 3 weeks that he may have developed pneumonia. Hx tobacco pipe smoking - about 25 years of smoking; quit 40 years ago.  Endorses some chronic nasal congestion and fatigue. Denies body aches, night sweats, rash, chest pain, or palpitations.  -Podiatry Referral Request: Pt was at [facility] rehab s/p LE angiography (sees Dr. Lucky Cowboy - had f/u yesterday) - Per Dr. Bunnie Domino note, LEFT foot shows marked improvement in revasularization, but still believes he will lose his 5th toe due to gangrene - Dr. Lucky Cowboy recommends follow up with podiatrist, pt does not have established podiatrist, so we will provide this referral today.  PT to continue Eliquis and Crestor.  Patient Active Problem List   Diagnosis Date Noted  . Atherosclerosis of native arteries of the extremities with gangrene ([place]) 06/13/2017  . CAD (coronary artery disease), native coronary artery 05/24/2017  . Arterial occlusion 05/23/2017  . Osteoarthritis of knee 05/09/2017  . Mild cognitive impairment 11/27/2016  . Mood disorder ([place]) 11/27/2016  . Vitamin B12 deficiency 11/27/2016  . Dermatitis 10/08/2016  . Thoracic aorta atherosclerosis ([place]) 10/08/2016  . CKD (chronic kidney disease) stage 3, GFR 30-59 ml/min (HCC) 12/29/2015  . Anemia 12/29/2015  . Medication monitoring encounter 12/29/2015  . Presbycusis 12/29/2015  . Cough 07/05/2015  . History of anticoagulant therapy 01/16/2015  . History of pulmonary embolus (PE) 01/16/2015  . Glaucoma 01/16/2015  . H/O malignant neoplasm of skin 01/16/2015  .  Hyperlipidemia LDL goal <70 01/16/2015  . Hypertension goal BP (blood pressure) < 140/90 01/16/2015  . Hypertensive heart/kidney disease without HF and with CKD stage III ([place]) 01/16/2015  . Calculus of kidney 01/16/2015  . Personal history of DVT (deep vein thrombosis) 01/16/2015  . Presence of stent in coronary artery 01/16/2015  . Pruritic dermatitis 01/16/2015    Social History   Tobacco Use  . Smoking status: Former Smoker    Types: Pipe  . Smokeless tobacco: Never Used  Substance Use Topics  . Alcohol use: No    Alcohol/week: 0.0 oz     Current Outpatient Medications:  .  albuterol (PROVENTIL HFA;VENTOLIN HFA) 108 (90 Base) MCG/ACT inhaler, Inhale 2 puffs into the lungs every 4 (four) hours as needed for wheezing or shortness of breath., Disp: 1 Inhaler, Rfl: 1 .  apixaban (ELIQUIS) 5 MG TABS tablet, Take 2 tablets (10 mg total) by mouth 2 (two) times daily. Till 06/02/17 And then 5 mg bid from 06/03/17, Disp: 60 tablet, Rfl: 0 .  atenolol (TENORMIN) 50 MG tablet, TAKE 1 TABLET (50 MG TOTAL) BY MOUTH DAILY., Disp: 90 tablet, Rfl: 1 .  Cholecalciferol (VITAMIN D-1000 MAX ST) 1000 units tablet, Take 5 tablets by mouth daily., Disp: , Rfl:  .  donepezil (ARICEPT) 5 MG tablet, Take 1 tablet by mouth every evening., Disp: , Rfl: 6 .  loratadine (CLARITIN) 10 MG tablet, Take 1 tablet (10 mg total) by mouth daily as needed for allergies., Disp: , Rfl:  .  Omega-3 Fatty Acids (FISH OIL PO), Take  1 capsule by mouth 2 (two) times daily., Disp: , Rfl:  .  oxyCODONE-acetaminophen (PERCOCET/ROXICET) 5-325 MG tablet, Take 1-2 tablets by mouth every 4 (four) hours as needed for moderate pain., Disp: 20 tablet, Rfl: 0 .  rosuvastatin (CRESTOR) 10 MG tablet, Take 1 tablet (10 mg total) by mouth daily., Disp: 90 tablet, Rfl: 3 .  senna-docusate (SENOKOT-S) 8.6-50 MG tablet, Take 1 tablet by mouth at bedtime as needed for mild constipation., Disp: 30 tablet, Rfl: 0 .  tiZANidine (ZANAFLEX) 2 MG  tablet, Take 2 mg by mouth every 6 (six) hours., Disp: , Rfl: 1 .  vitamin B-12 (CYANOCOBALAMIN) 1000 MCG tablet, Take 1 tablet by mouth daily., Disp: , Rfl:   Allergies  Allergen Reactions  . Aspirin Anaphylaxis, Swelling and Shortness Of Breath    REACTION: terrible bp reaction/wheezing  . Bee Pollen Other (See Comments)    ragweed  . Pollen Extract     ragweed  . Rabbit Epithelium     rabbits    ROS  Constitutional: Negative for fever or weight change.  Respiratory: Positive for cough and chronic shortness of breath (never uses inhaler that is prescribed).   Cardiovascular: Negative for chest pain or palpitations.  Gastrointestinal: Negative for abdominal pain. Skin: Negative for rash .  Neurological: Endorses some dizziness in the mornings that goes away after sitting for a moment. Denies headache.  No other specific complaints in a complete review of systems (except as listed in HPI above).  Objective  Vitals:   06/14/17 1137  BP: 130/68  Pulse: 87  Temp: 98.3 F (36.8 C)  TempSrc: Oral  SpO2: 93%   There is no height or weight on file to calculate BMI.  Nursing Note and Vital Signs reviewed.  Physical Exam Constitutional: Patient appears well-developed and well-nourished. Thin. No distress.  HEENT: head atraumatic, normocephalic, no maxillary or frontal sinus pain on palpation, neck supple without lymphadenopathy, oropharynx pink and moist without exudate Cardiovascular: Normal rate, regular rhythm, S1/S2 present.  No murmur or rub heard. No BLE edema.   Peripheral Vascular: LEFT pedal pulses is strong, lateral aspect of LEFT food is erythematous, gangrenous area to LEFT fifth toe present. Pulmonary/Chest: Effort normal and breath sounds with mild BUL rhonchi inititally, clears with cough. No respiratory distress or retractions. Psychiatric: Patient has a normal mood and affect. behavior is normal. Judgment and thought content normal.  Recent Results (from the  past 2160 hour(s))  CBC with Differential/Platelet     Status: None   Collection Time: 03/24/17 11:12 AM  Result Value Ref Range   WBC 8.0 3.8 - 10.8 Thousand/uL   RBC 5.21 4.20 - 5.80 Million/uL   Hemoglobin 15.5 13.2 - 17.1 g/dL   HCT 46.9 38.5 - 50.0 %   MCV 90.0 80.0 - 100.0 fL   MCH 29.8 27.0 - 33.0 pg   MCHC 33.0 32.0 - 36.0 g/dL   RDW 13.7 11.0 - 15.0 %   Platelets 171 140 - 400 Thousand/uL   MPV 9.3 7.5 - 12.5 fL   Neutro Abs 5,632 1,500 - 7,800 cells/uL   Lymphs Abs 1,440 850 - 3,900 cells/uL   WBC mixed population 688 200 - 950 cells/uL   Eosinophils Absolute 200 15 - 500 cells/uL   Basophils Absolute 40 0 - 200 cells/uL   Neutrophils Relative % 70.4 %   Total Lymphocyte 18.0 %   Monocytes Relative 8.6 %   Eosinophils Relative 2.5 %   Basophils Relative 0.5 %  COMPLETE  METABOLIC PANEL WITH GFR     Status: Abnormal   Collection Time: 03/24/17 11:12 AM  Result Value Ref Range   Glucose, Bld 95 65 - 99 mg/dL    Comment: .            Fasting reference interval .    BUN 19 7 - 25 mg/dL   Creat 1.37 (H) 0.70 - 1.11 mg/dL    Comment: For patients >66 years of age, the reference limit for Creatinine is approximately 13% higher for people identified as African-American. .    GFR, Est Non African American 46 (L) > OR = 60 mL/min/1.20m   GFR, Est African American 54 (L) > OR = 60 mL/min/1.765m  BUN/Creatinine Ratio 14 6 - 22 (calc)   Sodium 141 135 - 146 mmol/L   Potassium 4.8 3.5 - 5.3 mmol/L   Chloride 104 98 - 110 mmol/L   CO2 29 20 - 32 mmol/L   Calcium 9.6 8.6 - 10.3 mg/dL   Total Protein 7.2 6.1 - 8.1 g/dL   Albumin 4.1 3.6 - 5.1 g/dL   Globulin 3.1 1.9 - 3.7 g/dL (calc)   AG Ratio 1.3 1.0 - 2.5 (calc)   Total Bilirubin 0.6 0.2 - 1.2 mg/dL   Alkaline phosphatase (APISO) 89 40 - 115 U/L   AST 12 10 - 35 U/L   ALT 11 9 - 46 U/L  Lipid panel     Status: Abnormal   Collection Time: 03/24/17 11:12 AM  Result Value Ref Range   Cholesterol 164 <200 mg/dL    HDL 39 (L) >40 mg/dL   Triglycerides 150 (H) <150 mg/dL   LDL Cholesterol (Calc) 100 (H) mg/dL (calc)    Comment: Reference range: <100 . Desirable range <100 mg/dL for primary prevention;   <70 mg/dL for patients with CHD or diabetic patients  with > or = 2 CHD risk factors. . Marland KitchenDL-C is now calculated using the Martin-Hopkins  calculation, which is a validated novel method providing  better accuracy than the Friedewald equation in the  estimation of LDL-C.  MaCresenciano Genret al. JAAnnamaria Helling201610;960([id number]  (http://education.[employer].com/faq/FAQ164)    Total CHOL/HDL Ratio 4.2 <5.0 (calc)   Non-HDL Cholesterol (Calc) 125 <130 mg/dL (calc)    Comment: For patients with diabetes plus 1 major ASCVD risk  factor, treating to a non-HDL-C goal of <100 mg/dL  (LDL-C of <70 mg/dL) is considered a therapeutic  option.   Comprehensive metabolic panel     Status: Abnormal   Collection Time: 05/23/17  7:17 PM  Result Value Ref Range   Sodium 138 135 - 145 mmol/L   Potassium 3.8 3.5 - 5.1 mmol/L   Chloride 103 101 - 111 mmol/L   CO2 24 22 - 32 mmol/L   Glucose, Bld 112 (H) 65 - 99 mg/dL   BUN 23 (H) 6 - 20 mg/dL   Creatinine, Ser 1.15 0.61 - 1.24 mg/dL   Calcium 9.2 8.9 - 10.3 mg/dL   Total Protein 7.7 6.5 - 8.1 g/dL   Albumin 3.5 3.5 - 5.0 g/dL   AST 48 (H) 15 - 41 U/L   ALT 43 17 - 63 U/L   Alkaline Phosphatase 86 38 - 126 U/L   Total Bilirubin 0.9 0.3 - 1.2 mg/dL   GFR calc non Af Amer 56 (L) >60 mL/min   GFR calc Af Amer >60 >60 mL/min    Comment: (NOTE) The eGFR has been calculated using the CKD EPI equation. This calculation  has not been validated in all clinical situations. eGFR's persistently <60 mL/min signify possible Chronic Kidney Disease.    Anion gap 11 5 - 15  CBC     Status: Abnormal   Collection Time: 05/23/17  7:25 PM  Result Value Ref Range   WBC 14.0 (H) 3.8 - 10.6 K/uL   RBC 4.63 4.40 - 5.90 MIL/uL   Hemoglobin 14.1 13.0 - 18.0 g/dL   HCT 42.8 40.0  - 52.0 %   MCV 92.4 80.0 - 100.0 fL   MCH 30.5 26.0 - 34.0 pg   MCHC 33.0 32.0 - 36.0 g/dL   RDW 14.4 11.5 - 14.5 %   Platelets 193 150 - 440 K/uL  Protime-INR     Status: None   Collection Time: 05/23/17 11:16 PM  Result Value Ref Range   Prothrombin Time 13.8 11.4 - 15.2 seconds   INR 1.07   APTT     Status: None   Collection Time: 05/23/17 11:16 PM  Result Value Ref Range   aPTT 29 24 - 36 seconds  Heparin level (unfractionated)     Status: Abnormal   Collection Time: 05/24/17  6:50 AM  Result Value Ref Range   Heparin Unfractionated 1.08 (H) 0.30 - 0.70 IU/mL    Comment:        IF HEPARIN RESULTS ARE BELOW EXPECTED VALUES, AND PATIENT DOSAGE HAS BEEN CONFIRMED, SUGGEST FOLLOW UP TESTING OF ANTITHROMBIN III LEVELS.   Basic metabolic panel     Status: Abnormal   Collection Time: 05/24/17  6:50 AM  Result Value Ref Range   Sodium 138 135 - 145 mmol/L   Potassium 3.7 3.5 - 5.1 mmol/L   Chloride 107 101 - 111 mmol/L   CO2 24 22 - 32 mmol/L   Glucose, Bld 101 (H) 65 - 99 mg/dL   BUN 17 6 - 20 mg/dL   Creatinine, Ser 1.10 0.61 - 1.24 mg/dL   Calcium 8.5 (L) 8.9 - 10.3 mg/dL   GFR calc non Af Amer 59 (L) >60 mL/min   GFR calc Af Amer >60 >60 mL/min    Comment: (NOTE) The eGFR has been calculated using the CKD EPI equation. This calculation has not been validated in all clinical situations. eGFR's persistently <60 mL/min signify possible Chronic Kidney Disease.    Anion gap 7 5 - 15  CBC     Status: Abnormal   Collection Time: 05/24/17  6:50 AM  Result Value Ref Range   WBC 9.9 3.8 - 10.6 K/uL   RBC 4.23 (L) 4.40 - 5.90 MIL/uL   Hemoglobin 13.0 13.0 - 18.0 g/dL   HCT 38.7 (L) 40.0 - 52.0 %   MCV 91.6 80.0 - 100.0 fL   MCH 30.8 26.0 - 34.0 pg   MCHC 33.6 32.0 - 36.0 g/dL   RDW 14.3 11.5 - 14.5 %   Platelets 163 150 - 440 K/uL  Heparin level (unfractionated)     Status: None   Collection Time: 05/24/17  5:22 PM  Result Value Ref Range   Heparin Unfractionated  0.42 0.30 - 0.70 IU/mL    Comment:        IF HEPARIN RESULTS ARE BELOW EXPECTED VALUES, AND PATIENT DOSAGE HAS BEEN CONFIRMED, SUGGEST FOLLOW UP TESTING OF ANTITHROMBIN III LEVELS.   Heparin level (unfractionated)     Status: None   Collection Time: 05/25/17  1:07 AM  Result Value Ref Range   Heparin Unfractionated 0.41 0.30 - 0.70 IU/mL    Comment:  IF HEPARIN RESULTS ARE BELOW EXPECTED VALUES, AND PATIENT DOSAGE HAS BEEN CONFIRMED, SUGGEST FOLLOW UP TESTING OF ANTITHROMBIN III LEVELS.   CBC     Status: None   Collection Time: 05/25/17  4:49 AM  Result Value Ref Range   WBC 9.2 3.8 - 10.6 K/uL   RBC 4.57 4.40 - 5.90 MIL/uL   Hemoglobin 14.1 13.0 - 18.0 g/dL   HCT 42.3 40.0 - 52.0 %   MCV 92.6 80.0 - 100.0 fL   MCH 30.7 26.0 - 34.0 pg   MCHC 33.2 32.0 - 36.0 g/dL   RDW 14.4 11.5 - 14.5 %   Platelets 174 150 - 440 K/uL  Surgical PCR screen     Status: None   Collection Time: 05/25/17  7:32 PM  Result Value Ref Range   MRSA, PCR NEGATIVE NEGATIVE   Staphylococcus aureus NEGATIVE NEGATIVE    Comment: (NOTE) The Xpert SA Assay (FDA approved for NASAL specimens in patients 68 years of age and older), is one component of a comprehensive surveillance program. It is not intended to diagnose infection nor to guide or monitor treatment.   Heparin level (unfractionated)     Status: Abnormal   Collection Time: 05/26/17  4:59 AM  Result Value Ref Range   Heparin Unfractionated 0.29 (L) 0.30 - 0.70 IU/mL    Comment:        IF HEPARIN RESULTS ARE BELOW EXPECTED VALUES, AND PATIENT DOSAGE HAS BEEN CONFIRMED, SUGGEST FOLLOW UP TESTING OF ANTITHROMBIN III LEVELS.   CBC     Status: None   Collection Time: 05/26/17  4:59 AM  Result Value Ref Range   WBC 8.6 3.8 - 10.6 K/uL   RBC 4.65 4.40 - 5.90 MIL/uL   Hemoglobin 14.4 13.0 - 18.0 g/dL   HCT 42.8 40.0 - 52.0 %   MCV 92.0 80.0 - 100.0 fL   MCH 31.0 26.0 - 34.0 pg   MCHC 33.7 32.0 - 36.0 g/dL   RDW 14.2 11.5 - 14.5 %    Platelets 175 150 - 440 K/uL  Protime-INR     Status: None   Collection Time: 05/26/17  4:59 AM  Result Value Ref Range   Prothrombin Time 13.4 11.4 - 15.2 seconds   INR 8.29   Basic metabolic panel     Status: Abnormal   Collection Time: 05/26/17  4:59 AM  Result Value Ref Range   Sodium 135 135 - 145 mmol/L   Potassium 3.9 3.5 - 5.1 mmol/L   Chloride 101 101 - 111 mmol/L   CO2 20 (L) 22 - 32 mmol/L   Glucose, Bld 76 65 - 99 mg/dL   BUN 18 6 - 20 mg/dL   Creatinine, Ser 1.19 0.61 - 1.24 mg/dL   Calcium 9.0 8.9 - 10.3 mg/dL   GFR calc non Af Amer 53 (L) >60 mL/min   GFR calc Af Amer >60 >60 mL/min    Comment: (NOTE) The eGFR has been calculated using the CKD EPI equation. This calculation has not been validated in all clinical situations. eGFR's persistently <60 mL/min signify possible Chronic Kidney Disease.    Anion gap 14 5 - 15  Heparin level (unfractionated)     Status: None   Collection Time: 05/26/17  1:01 PM  Result Value Ref Range   Heparin Unfractionated 0.46 0.30 - 0.70 IU/mL    Comment:        IF HEPARIN RESULTS ARE BELOW EXPECTED VALUES, AND PATIENT DOSAGE HAS BEEN CONFIRMED, SUGGEST FOLLOW  UP TESTING OF ANTITHROMBIN III LEVELS.   Heparin level (unfractionated)     Status: None   Collection Time: 05/27/17 12:27 AM  Result Value Ref Range   Heparin Unfractionated 0.36 0.30 - 0.70 IU/mL    Comment:        IF HEPARIN RESULTS ARE BELOW EXPECTED VALUES, AND PATIENT DOSAGE HAS BEEN CONFIRMED, SUGGEST FOLLOW UP TESTING OF ANTITHROMBIN III LEVELS.   Heparin level (unfractionated)     Status: None   Collection Time: 05/27/17  7:59 AM  Result Value Ref Range   Heparin Unfractionated 0.33 0.30 - 0.70 IU/mL    Comment:        IF HEPARIN RESULTS ARE BELOW EXPECTED VALUES, AND PATIENT DOSAGE HAS BEEN CONFIRMED, SUGGEST FOLLOW UP TESTING OF ANTITHROMBIN III LEVELS.   CBC     Status: Abnormal   Collection Time: 05/27/17  7:59 AM  Result Value Ref Range    WBC 10.3 3.8 - 10.6 K/uL   RBC 4.09 (L) 4.40 - 5.90 MIL/uL   Hemoglobin 12.6 (L) 13.0 - 18.0 g/dL   HCT 37.3 (L) 40.0 - 52.0 %   MCV 91.3 80.0 - 100.0 fL   MCH 30.7 26.0 - 34.0 pg   MCHC 33.6 32.0 - 36.0 g/dL   RDW 14.5 11.5 - 14.5 %   Platelets 183 150 - 440 K/uL     Assessment & Plan  1. Cough - DG Chest 2 View; Future - benzonatate (TESSALON) 100 MG capsule; Take 1 capsule (100 mg total) by mouth 3 (three) times daily as needed for cough.  Dispense: 30 capsule; Refill: 0 - CBC w/Diff/Platelet - Comprehensive metabolic panel - Advised that because this is chronic cough with stable vital signs and hx smoking, we will check CXR prior to empiric treatment.  - Pt has close follow up with Dr. Sanda Klein in 5 days scheduled. - Encouraged pt to use inhaler as needed for cough and shortness of breath.  2. Atherosclerosis of native artery of left lower extremity with gangrene [facility]) - Ambulatory referral to Podiatry - Maintain follow up with Dr. Lucky Cowboy.  3. History of tobacco use - DG Chest 2 View; Future - Discussed option to add low dose lung CT for lung cancer screening, but pt declines at this time.  - Return in about 5 days (around 06/19/2017) for As planned with Dr. Sanda Klein.. -Red flags and when to present for emergency care or RTC including fever >101.59F, chest pain, shortness of breath, new/worsening/un-resolving symptoms, reviewed with patient at time of visit. Follow up and care instructions discussed and provided in AVS.

## 2017-06-16 ENCOUNTER — Ambulatory Visit: Payer: Medicare HMO | Admitting: Family Medicine

## 2017-06-16 ENCOUNTER — Encounter: Payer: Self-pay | Admitting: Family Medicine

## 2017-06-16 VITALS — BP 112/58 | HR 76 | Temp 97.4°F | Resp 16

## 2017-06-16 DIAGNOSIS — B179 Acute viral hepatitis, unspecified: Secondary | ICD-10-CM | POA: Diagnosis not present

## 2017-06-16 DIAGNOSIS — R740 Nonspecific elevation of levels of transaminase and lactic acid dehydrogenase [LDH]: Secondary | ICD-10-CM | POA: Diagnosis not present

## 2017-06-16 DIAGNOSIS — I96 Gangrene, not elsewhere classified: Secondary | ICD-10-CM | POA: Diagnosis not present

## 2017-06-16 DIAGNOSIS — Z86711 Personal history of pulmonary embolism: Secondary | ICD-10-CM

## 2017-06-16 DIAGNOSIS — J181 Lobar pneumonia, unspecified organism: Secondary | ICD-10-CM

## 2017-06-16 DIAGNOSIS — E785 Hyperlipidemia, unspecified: Secondary | ICD-10-CM | POA: Diagnosis not present

## 2017-06-16 DIAGNOSIS — R7401 Elevation of levels of liver transaminase levels: Secondary | ICD-10-CM

## 2017-06-16 DIAGNOSIS — R748 Abnormal levels of other serum enzymes: Secondary | ICD-10-CM | POA: Diagnosis not present

## 2017-06-16 DIAGNOSIS — J189 Pneumonia, unspecified organism: Secondary | ICD-10-CM

## 2017-06-16 HISTORY — DX: Gangrene, not elsewhere classified: I96

## 2017-06-16 NOTE — Assessment & Plan Note (Signed)
On Eliquis; no bleeding reported by patient

## 2017-06-16 NOTE — Progress Notes (Signed)
BP (!) 112/58   Pulse 76   Temp (!) 97.4 F (36.3 C) (Oral)   Resp 16   SpO2 97%    Subjective:    Patient ID: [name] [name], male    DOB: 27-Jul-1927, 83 y.o.   MRN: [id number]  HPI: [name] Haihomas E Magouirk is a 83 y.o. male  Chief Complaint  Patient presents with  . Follow-up    labs    HPI Patient is here with his daughter at my request following elevated LFTs noted in blood drawn Saturday; he was seen in Saturday, diagnosed with pneumonia and treated with levaquin He was at a nursing home after hospital stay, had vascular stent, dry gangrene; daughter says they may still need to amputate the left pinky toe No trouble breathing; he is coughing; bringing up mucous; cloudy No fevers Appetite is okay; he ate okay yesterday; daughter is helping, preparing meal No abdominal pain No nausea No blood products No alcohol Not taking much tylenol, just once in a while He is on gabapentin; brand new No blood in stool or urine, on Eliquis Also on statin, Crestor  CLINICAL DATA:  Productive cough for over 1 week  EXAM: CHEST  2 VIEW  COMPARISON:  10/08/2016  FINDINGS: Heart is normal size. Increasing opacity in the right upper lobe concerning for pneumonia. Underlying chronic lung disease with scarring/ fibrosis throughout the lungs, most pronounced in the left lung base. No effusions. Heart is normal size.  IMPRESSION: New right upper lobe airspace opacity concerning for right upper lobe pneumonia superimposed on chronic lung disease.   Electronically Signed   By: Charlett NoseKevin  Dover M.D.   On: 06/14/2017 15:01  [facility] 06/16/2017 03/24/2017 11/19/2016 10/08/2016 12/29/2015  Decreased Interest 0 0 0 0 0  Down, Depressed, Hopeless 0 0 0 0 0  PHQ - 2 Score 0 0 0 0 0    Relevant past medical, surgical, family and social history reviewed Past Medical History:  Diagnosis Date  . Arthritis   . CKD (chronic kidney disease) stage 3, GFR 30-59 ml/min (HCC)  12/29/2015  . Dry gangrene (HCC) 06/16/2017   Great toe and 5th toe LEFT foot; Dr. Wyn Quakerew  . Hyperlipidemia   . Hypertension   . Left leg DVT (HCC) 01/2014  . Myocardial infarction (HCC)   . Pulmonary embolism (HCC) 01/2014  . Thoracic aorta atherosclerosis (HCC) 10/08/2016   Past Surgical History:  Procedure Laterality Date  . CORONARY STENT PLACEMENT  2006  . JOINT REPLACEMENT  2011 & 2012   both hips  . len replaced Left 2015  . LOWER EXTREMITY ANGIOGRAPHY N/A 05/26/2017   Procedure: Lower Extremity Angiography;  Surgeon: Annice Needyew, Jason S, MD;  Location: [facility] INVASIVE CV LAB;  Service: Cardiovascular;  Laterality: N/A;  . MELANOMA EXCISION     40 years ago   Family History  Problem Relation Age of Onset  . Arthritis Mother   . Arthritis Father   . Depression Father    Social History   Tobacco Use  . Smoking status: Former Smoker    Types: Pipe  . Smokeless tobacco: Never Used  Substance Use Topics  . Alcohol use: No    Alcohol/week: 0.0 oz  . Drug use: No    Interim medical history since last visit reviewed. Allergies and medications reviewed  Review of Systems Per HPI unless specifically indicated above     Objective:    BP (!) 112/58   Pulse 76   Temp (!)  97.4 F (36.3 C) (Oral)   Resp 16   SpO2 97%   Wt Readings from Last 3 Encounters:  06/13/17 145 lb (65.8 kg)  05/26/17 150 lb (68 kg)  03/24/17 154 lb 8 oz (70.1 kg)    Physical Exam  Constitutional: He appears well-developed and well-nourished. No distress.  Elderly frail-appearing male, seated in wheelchair; gait not assessed; occasional cough; nontoxic  Eyes: No scleral icterus.  Cardiovascular: Normal rate and regular rhythm.  Pulses:      Dorsalis pedis pulses are 2+ on the right side, and 2+ on the left side.  black necrosis/dry gangrene of tip of LEFT great toe, LEFT pinky toe; no active drainage; no proximal erythema; no induration; no odor  Pulmonary/Chest: Effort normal. No accessory muscle  usage. No respiratory distress. He has no decreased breath sounds. He has no wheezes. He has rhonchi (RUL).  Abdominal: Soft. Normal appearance and bowel sounds are normal. There is no hepatosplenomegaly. There is no tenderness.  Neurological: He is alert.  Skin: He is not diaphoretic. No pallor.  No jaundice  Psychiatric: He has a normal mood and affect. His mood appears not anxious. He does not exhibit a depressed mood.   Results for orders placed or performed during the hospital encounter of 06/14/17  CBC with Differential/Platelet  Result Value Ref Range   WBC 12.5 (H) 3.8 - 10.6 K/uL   RBC 4.27 (L) 4.40 - 5.90 MIL/uL   Hemoglobin 12.8 (L) 13.0 - 18.0 g/dL   HCT 40.938.9 (L) 81.140.0 - 91.452.0 %   MCV 91.1 80.0 - 100.0 fL   MCH 30.1 26.0 - 34.0 pg   MCHC 33.0 32.0 - 36.0 g/dL   RDW 78.214.1 95.611.5 - 21.314.5 %   Platelets 336 150 - 440 K/uL   Neutrophils Relative % 93 %   Neutro Abs 11.7 (H) 1.4 - 6.5 K/uL   Lymphocytes Relative 4 %   Lymphs Abs 0.5 (L) 1.0 - 3.6 K/uL   Monocytes Relative 3 %   Monocytes Absolute 0.3 0.2 - 1.0 K/uL   Eosinophils Relative 0 %   Eosinophils Absolute 0.0 0 - 0.7 K/uL   Basophils Relative 0 %   Basophils Absolute 0.0 0 - 0.1 K/uL  Comprehensive metabolic panel  Result Value Ref Range   Sodium 132 (L) 135 - 145 mmol/L   Potassium 4.3 3.5 - 5.1 mmol/L   Chloride 96 (L) 101 - 111 mmol/L   CO2 24 22 - 32 mmol/L   Glucose, Bld 159 (H) 65 - 99 mg/dL   BUN 22 (H) 6 - 20 mg/dL   Creatinine, Ser 0.861.18 0.61 - 1.24 mg/dL   Calcium 9.2 8.9 - 57.810.3 mg/dL   Total Protein 8.0 6.5 - 8.1 g/dL   Albumin 2.6 (L) 3.5 - 5.0 g/dL   AST 469820 (H) 15 - 41 U/L   ALT 358 (H) 17 - 63 U/L   Alkaline Phosphatase 184 (H) 38 - 126 U/L   Total Bilirubin 1.0 0.3 - 1.2 mg/dL   GFR calc non Af Amer 54 (L) >60 mL/min   GFR calc Af Amer >60 >60 mL/min   Anion gap 12 5 - 15      Assessment & Plan:   Problem List Items Addressed This Visit      Other   Hyperlipidemia LDL goal <70    STOP  statin for now until liver enzymes return to normal      History of pulmonary embolus (PE)  On Eliquis; no bleeding reported by patient      Dry gangrene [facility])    Follow-up with vascular doctor       Other Visit Diagnoses    Acute hepatitis    -  Primary   explained lab results, brand new finding; check labs today to see if trending up or down; he appears well, asx from a liver standpoint; stop gabapentin & statin   Relevant Orders   COMPLETE METABOLIC PANEL WITH GFR   Hepatitis panel, acute   ANA,IFA RA Diag Pnl w/rflx Tit/Patn   SMOOTH MUSC W/REFL   Elevated alkaline phosphatase level       check lab today with others   Relevant Orders   COMPLETE METABOLIC PANEL WITH GFR   Hepatitis panel, acute   ANA,IFA RA Diag Pnl w/rflx Tit/Patn   SMOOTH MUSC W/REFL   Elevated ALT measurement       recheck labs today to see if trending up or down; stop gabapentin, statin; avoid alcohol, tylenol in meantime   Relevant Orders   COMPLETE METABOLIC PANEL WITH GFR   Hepatitis panel, acute   Pneumonia of right upper lobe due to infectious organism (HCC)       continue levaquin; GFR >50; plan to get f/u CXR in 2-3 weeks to follow and exclude malignancy       Follow up plan: Return in about 3 days (around 06/19/2017).  An after-visit summary was printed and given to the patient at check-out.  Please see the patient instructions which may contain other information and recommendations beyond what is mentioned above in the assessment and plan.  No orders of the defined types were placed in this encounter.   Orders Placed This Encounter  Procedures  . COMPLETE METABOLIC PANEL WITH GFR  . Hepatitis panel, acute  . ANA,IFA RA Diag Pnl w/rflx Tit/Patn  . SMOOTH MUSC W/REFL

## 2017-06-16 NOTE — Assessment & Plan Note (Signed)
STOP statin for now until liver enzymes return to normal

## 2017-06-16 NOTE — Assessment & Plan Note (Signed)
Follow up with vascular doctor

## 2017-06-16 NOTE — Patient Instructions (Addendum)
STOP the gabapentin STOP the rosuvastatin (Crestor) for now  We'll get labs today Avoid all Tylenol or acetaminophen for now completely If you develop abdominal pain, nausea, vomiting, confusion, fevers, or other concerning symptoms, please call us right away or go to the emergency room

## 2017-06-17 ENCOUNTER — Ambulatory Visit: Payer: Medicare HMO | Admitting: Family Medicine

## 2017-06-17 ENCOUNTER — Other Ambulatory Visit: Payer: Self-pay

## 2017-06-17 ENCOUNTER — Telehealth: Payer: Self-pay | Admitting: Family Medicine

## 2017-06-17 ENCOUNTER — Telehealth: Payer: Self-pay | Admitting: Gastroenterology

## 2017-06-17 DIAGNOSIS — I82432 Acute embolism and thrombosis of left popliteal vein: Secondary | ICD-10-CM | POA: Diagnosis not present

## 2017-06-17 DIAGNOSIS — F0390 Unspecified dementia without behavioral disturbance: Secondary | ICD-10-CM | POA: Diagnosis not present

## 2017-06-17 DIAGNOSIS — I129 Hypertensive chronic kidney disease with stage 1 through stage 4 chronic kidney disease, or unspecified chronic kidney disease: Secondary | ICD-10-CM | POA: Diagnosis not present

## 2017-06-17 DIAGNOSIS — I252 Old myocardial infarction: Secondary | ICD-10-CM | POA: Diagnosis not present

## 2017-06-17 DIAGNOSIS — I70262 Atherosclerosis of native arteries of extremities with gangrene, left leg: Secondary | ICD-10-CM | POA: Diagnosis not present

## 2017-06-17 DIAGNOSIS — R7401 Elevation of levels of liver transaminase levels: Secondary | ICD-10-CM

## 2017-06-17 DIAGNOSIS — M1712 Unilateral primary osteoarthritis, left knee: Secondary | ICD-10-CM | POA: Diagnosis not present

## 2017-06-17 DIAGNOSIS — N183 Chronic kidney disease, stage 3 (moderate): Secondary | ICD-10-CM | POA: Diagnosis not present

## 2017-06-17 DIAGNOSIS — R748 Abnormal levels of other serum enzymes: Secondary | ICD-10-CM

## 2017-06-17 DIAGNOSIS — R740 Nonspecific elevation of levels of transaminase and lactic acid dehydrogenase [LDH]: Principal | ICD-10-CM

## 2017-06-17 DIAGNOSIS — M5136 Other intervertebral disc degeneration, lumbar region: Secondary | ICD-10-CM | POA: Diagnosis not present

## 2017-06-17 DIAGNOSIS — L89620 Pressure ulcer of left heel, unstageable: Secondary | ICD-10-CM | POA: Diagnosis not present

## 2017-06-17 NOTE — Telephone Encounter (Signed)
That's fine

## 2017-06-17 NOTE — Telephone Encounter (Signed)
Left voice message for patient to call and set up an appt with Dr. Tobi BastosAnna on 06/18/17 for abnormal LFT's.

## 2017-06-17 NOTE — Telephone Encounter (Signed)
[facility] requesting verbal order for Skilled nursing to look at left great toe, left pinky toe, and heel. Nurse stated was all scabbed over and unable to stage. Also requesting home health RN, physical therapy and occupational therapy for assistance of daily living. Can be reached at [phone] can leave message.

## 2017-06-18 ENCOUNTER — Other Ambulatory Visit: Payer: Self-pay

## 2017-06-18 ENCOUNTER — Encounter: Payer: Self-pay | Admitting: Gastroenterology

## 2017-06-18 ENCOUNTER — Ambulatory Visit: Payer: Medicare HMO | Admitting: Gastroenterology

## 2017-06-18 ENCOUNTER — Telehealth: Payer: Self-pay | Admitting: Family Medicine

## 2017-06-18 VITALS — BP 124/71 | HR 73 | Temp 97.5°F | Ht 60.0 in | Wt 149.4 lb

## 2017-06-18 DIAGNOSIS — R768 Other specified abnormal immunological findings in serum: Secondary | ICD-10-CM | POA: Insufficient documentation

## 2017-06-18 DIAGNOSIS — R945 Abnormal results of liver function studies: Secondary | ICD-10-CM | POA: Diagnosis not present

## 2017-06-18 DIAGNOSIS — R7989 Other specified abnormal findings of blood chemistry: Secondary | ICD-10-CM

## 2017-06-18 NOTE — Progress Notes (Signed)
Wyline Mood MD, MRCP(U.K) [address]  [address]  [place], [place] [address]  Main: [phone]  Fax: [phone]   Gastroenterology Consultation  Referring Provider:     Kerman Passey, MD Primary Care Physician:  Kerman Passey, MD Primary Gastroenterologist:  Dr. Wyline Mood  Reason for Consultation:     Abnormal LFT's        HPI:   [name] [name] is a 83 y.o. y/o male referred for consultation & management  by Dr. Sherie Don, Janit Bern, MD.     He has been urgently referred by Dr Sherie Don for abnormal LFT's  First noted abnormality in LFT's in 06/14/17  .  Alcohol use none   Drug use : none  Over the counter herbal supplements none  New medications none  Abdominal pain none  Tattoos none  Military service : yes  Prior blood transfusion no  Incarceration no  History of travel no  Family history of liver disease no  Recent change in weight : none   Labs 12/171/18 -acute hepatitis Panel negative,ANA negative, Rheumatoid factor and CCP ab is positive.  Recently on 05/26/17 he underwent vascular surgicalprocedures for PAD   CMP Latest Ref Rng & Units 06/16/2017 06/14/2017 05/26/2017  Glucose 65 - 99 mg/dL 130(Q) 657(Q) 76  BUN 7 - 25 mg/dL 46(N) 62(X) 18  Creatinine 0.70 - 1.11 mg/dL 5.28(U) 1.32 4.40  Sodium 135 - 146 mmol/L 140 132(L) 135  Potassium 3.5 - 5.3 mmol/L 4.2 4.3 3.9  Chloride 98 - 110 mmol/L 101 96(L) 101  CO2 20 - 32 mmol/L 27 24 20(L)  Calcium 8.6 - 10.3 mg/dL 9.5 9.2 9.0  Total Protein 6.1 - 8.1 g/dL 7.0 8.0 -  Total Bilirubin 0.2 - 1.2 mg/dL 0.4 1.0 -  Alkaline Phos 38 - 126 U/L - 184(H) -  AST 10 - 35 U/L 566(H) 820(H) -  ALT 9 - 46 U/L 565(H) 358(H) -          Past Medical History:  Diagnosis Date  . Arthritis   . CKD (chronic kidney disease) stage 3, GFR 30-59 ml/min (HCC) 12/29/2015  . Dry gangrene (HCC) 06/16/2017   Great toe and 5th toe LEFT foot; Dr. Wyn Quaker  . Hyperlipidemia   . Hypertension   . Left leg DVT (HCC) 01/2014    . Myocardial infarction (HCC)   . Pulmonary embolism (HCC) 01/2014  . Thoracic aorta atherosclerosis (HCC) 10/08/2016    Past Surgical History:  Procedure Laterality Date  . CORONARY STENT PLACEMENT  2006  . JOINT REPLACEMENT  2011 & 2012   both hips  . len replaced Left 2015  . LOWER EXTREMITY ANGIOGRAPHY N/A 05/26/2017   Procedure: Lower Extremity Angiography;  Surgeon: Annice Needy, MD;  Location: [facility] INVASIVE CV LAB;  Service: Cardiovascular;  Laterality: N/A;  . MELANOMA EXCISION     40 years ago    Prior to Admission medications   Medication Sig Start Date End Date Taking? Authorizing Provider  albuterol (PROVENTIL HFA;VENTOLIN HFA) 108 (90 Base) MCG/ACT inhaler Inhale 2 puffs into the lungs every 4 (four) hours as needed for wheezing or shortness of breath. 11/19/16   Lada, Janit Bern, MD  apixaban (ELIQUIS) 5 MG TABS tablet Take 2 tablets (10 mg total) by mouth 2 (two) times daily. Till 06/02/17 And then 5 mg bid from 06/03/17 05/28/17   Enedina Finner, MD  atenolol (TENORMIN) 50 MG tablet TAKE 1 TABLET (50 MG TOTAL) BY MOUTH DAILY. 06/01/17  Kerman Passey, MD  benzonatate (TESSALON) 100 MG capsule Take 1 capsule (100 mg total) by mouth 3 (three) times daily as needed for cough. 06/14/17   Doren Custard, FNP  Cholecalciferol (VITAMIN D-1000 MAX ST) 1000 units tablet Take 5 tablets by mouth daily.    [provider]  donepezil (ARICEPT) 5 MG tablet Take 1 tablet by mouth every evening. 04/27/17   [provider]  levofloxacin (LEVAQUIN) 500 MG tablet Take 1 tablet (500 mg total) by mouth daily. 06/14/17   Doren Custard, FNP  loratadine (CLARITIN) 10 MG tablet Take 1 tablet (10 mg total) by mouth daily as needed for allergies. 11/19/16   Kerman Passey, MD  Omega-3 Fatty Acids (FISH OIL PO) Take 1 capsule by mouth 2 (two) times daily.    [provider]  oxyCODONE-acetaminophen (PERCOCET/ROXICET) 5-325 MG tablet Take 1-2 tablets by mouth every 4 (four)  hours as needed for moderate pain. 05/28/17   Enedina Finner, MD  rosuvastatin (CRESTOR) 10 MG tablet Take 10 mg by mouth daily. 06/14/17   [provider]  senna-docusate (SENOKOT-S) 8.6-50 MG tablet Take 1 tablet by mouth at bedtime as needed for mild constipation. 05/28/17   Enedina Finner, MD  tiZANidine (ZANAFLEX) 2 MG tablet Take 2 mg by mouth every 6 (six) hours. 05/12/17   [provider]  vitamin B-12 (CYANOCOBALAMIN) 1000 MCG tablet Take 1 tablet by mouth daily.    [provider]    Family History  Problem Relation Age of Onset  . Arthritis Mother   . Arthritis Father   . Depression Father      Social History   Tobacco Use  . Smoking status: Former Smoker    Types: Pipe  . Smokeless tobacco: Never Used  Substance Use Topics  . Alcohol use: No    Alcohol/week: 0.0 oz  . Drug use: No    Allergies as of 06/18/2017 - Review Complete 06/16/2017  Allergen Reaction Noted  . Aspirin Anaphylaxis, Swelling, and Shortness Of Breath   . Bee pollen Other (See Comments) 01/16/2015  . Pollen extract  01/16/2015  . Rabbit epithelium  01/16/2015    Review of Systems:    All systems reviewed and negative except where noted in HPI.   Physical Exam:  There were no vitals taken for this visit. No LMP for male patient. Psych:  Alert and cooperative. Normal mood and affect. General:   Alert,  Well-developed, well-nourished, pleasant and cooperative in NAD Head:  Normocephalic and atraumatic. Eyes:  Sclera clear, no icterus.   Conjunctiva pink. Ears:  Normal auditory acuity. Nose:  No deformity, discharge, or lesions. Mouth:  No deformity or lesions,oropharynx pink & moist. Neck:  Supple; no masses or thyromegaly. Lungs:  Respirations even and unlabored.  Clear throughout to auscultation.   No wheezes, crackles, or rhonchi. No acute distress. Heart:  Regular rate and rhythm; no murmurs, clicks, rubs, or gallops. Abdomen:  Normal bowel sounds.  No bruits.   Soft, non-tender and non-distended without masses, hepatosplenomegaly or hernias noted.  No guarding or rebound tenderness.    Msk:  Symmetrical without gross deformities. Good, equal movement & strength bilaterally. Pulses:  Normal pulses noted. Extremities:  No clubbing or edema.  No cyanosis. Neurologic:  Alert and oriented x3;  grossly normal neurologically. Skin:  Intact without significant lesions or rashes. No jaundice. Lymph Nodes:  No significant cervical adenopathy. Psych:  Alert and cooperative. Normal mood and affect.  Imaging Studies: Dg Chest  2 View  Result Date: 06/14/2017 CLINICAL DATA:  Productive cough for over 1 week EXAM: CHEST  2 VIEW COMPARISON:  10/08/2016 FINDINGS: Heart is normal size. Increasing opacity in the right upper lobe concerning for pneumonia. Underlying chronic lung disease with scarring/ fibrosis throughout the lungs, most pronounced in the left lung base. No effusions. Heart is normal size. IMPRESSION: New right upper lobe airspace opacity concerning for right upper lobe pneumonia superimposed on chronic lung disease. Electronically Signed   By: Charlett Nose M.D.   On: 06/14/2017 15:01   Dg Pelvis 1-2 Views  Result Date: 05/23/2017 CLINICAL DATA:  Left leg pain after fall in shower 2 days ago. EXAM: PELVIS - 1-2 VIEW COMPARISON:  02/19/2010 FINDINGS: L4-5 and L5-S1 facet and degenerative disc disease. Intact bony pelvis with bilateral press fit arthroplasties of both hips. No prosthetic dislocation or evidence of hardware failure. The femoral components are cemented. Extensive bi-iliofemoral atherosclerosis. IMPRESSION: 1. Lower lumbar degenerative disc and facet arthropathy of the included lumbar spine. 2. No acute osseous abnormality of the bony pelvis. Electronically Signed   By: Tollie Eth M.D.   On: 05/23/2017 18:15   Dg Tibia/fibula Left  Result Date: 05/23/2017 CLINICAL DATA:  Left leg pain after fall in shower 2 days ago. EXAM: LEFT TIBIA AND  FIBULA - 2 VIEW COMPARISON:  None. FINDINGS: Tricompartmental osteoarthritis of the left knee. Intact ankle joint. No acute fracture, joint dislocation or suspicious osseous lesions. Extensive popliteal through tibial arteriosclerosis. No significant soft tissue tissue swelling. IMPRESSION: Tricompartmental osteoarthritis of the knee. No acute fracture of the tibia nor fibula. Electronically Signed   By: Tollie Eth M.D.   On: 05/23/2017 18:16   Ct Angio Ao+bifem W & Or Wo Contrast  Result Date: 05/23/2017 CLINICAL DATA:  Decreased pulse in left leg. Sudden onset of left leg pain. Patient reports recent fall in the shower. EXAM: CT ANGIOGRAPHY OF ABDOMINAL AORTA WITH ILIOFEMORAL RUNOFF TECHNIQUE: Multidetector CT imaging of the abdomen, pelvis and lower extremities was performed using the standard protocol during bolus administration of intravenous contrast. Multiplanar CT image reconstructions and MIPs were obtained to evaluate the vascular anatomy. CONTRAST:  ISOVUE-370 IOPAMIDOL (ISOVUE-370) INJECTION 76% COMPARISON:  None. FINDINGS: VASCULAR Aorta: Moderate calcified and noncalcified atheromatous plaque without aneurysm, dissection or periaortic stranding. Scattered luminal stenosis of less than 25%. Celiac: Calcified plaque at the origin with less than 50% stenosis, mild poststenotic dilatation. SMA: Mild plaque at the origin without significant stenosis. Replaced hepatic artery is incidentally noted arising from the SMA. Renals: Plaque at the origin without significant stenosis. No evidence of vasculitis or fibromuscular dysplasia. IMA: Patent with plaque at the origin. RIGHT Lower Extremity Inflow: Common, internal and external iliac arteries are patent without evidence of aneurysm, dissection, vasculitis or significant stenosis. Mild calcified plaque. Outflow: Common, superficial and profunda femoral arteries and the popliteal artery are patent without evidence of aneurysm, dissection, vasculitis  or significant stenosis. Mild calcified and noncalcified plaque in the proximal common femoral, and throughout the superficial femoral artery. Runoff: Moderate calcified plaque of the popliteal artery. Posterior tibial and peroneal arteries are patent to the mid lower leg, small in caliber. No flow in the distal peroneal artery. Densely calcified anterior tibial artery which may be occluded proximally, however reconstitutes distally at the ankle. LEFT Lower Extremity Inflow: Common, internal and external iliac arteries are patent without evidence of aneurysm, dissection, vasculitis or significant stenosis. Mild calcified plaque. Outflow: Common, superficial and profunda femoral arteries and the popliteal  artery are patent without evidence of aneurysm, dissection, vasculitis or significant stenosis. Calcified and noncalcified plaques throughout the SFA, with plaque distally causing moderate stenosis. Abrupt occlusion of the distal SFA at the level of the mid distal femur extending to the popliteal artery. Curvilinear density in the periphery of the distal most SFA may be trace reconstitution versus calcific plaque. Mild stranding about the distal SFA. Runoff: Reconstitution of the anterior tibial artery and tibioperoneal trunk with some flow to the ankle. Possible embolic disease with filling defects in the proximal posterior tibial and peroneal arteries and anterior tibial artery at the level of the ankle. Veins: No obvious venous abnormality within the limitations of this arterial phase study. Review of the MIP images confirms the above findings. NON-VASCULAR Lower chest: Linear atelectasis in the left lower lobe. The heart is normal in size. Hepatobiliary: No focal lesion allowing for arterial phase imaging. Possible small stones or sludge in the gallbladder without abnormal gallbladder distention. Pancreas: Motion artifact through the pancreas, no obvious ductal dilatation or inflammation. Spleen: Normal in size  and arterial phase imaging. Splenule superiorly. Adrenals/Urinary Tract: No definite adrenal nodule. Mild thinning of renal parenchyma without hydronephrosis. Homogeneous renal enhancement. Urinary bladder is physiologically distended. Stomach/Bowel: Stomach is decompressed. No bowel dilatation or inflammation. Rather extensive colonic diverticulosis from the splenic flexure distally. No diverticulitis. Lymphatic: Small paraesophageal lymph nodes adjacent of the distal esophagus, of uncertain etiology. No additional enlarged lymph nodes in the abdomen or pelvis. Reproductive: Prostate gland obscured by streak artifact from bilateral hip prostheses. Other: No ascites.  No free air. Musculoskeletal: Bilateral hip arthroplasties. T11 compression fracture appears remote. Multilevel degenerative change in the lumbar spine. No acute osseous abnormalities in the lower extremities. IMPRESSION: VASCULAR 1. Total or near total occlusion of the distal left superficial femoral artery extending through the popliteal. Distal reconstitution of the calf vessels with intraluminal filling defects in the calf vessels suspicious for embolic disease. 2. Absent flow in the distal right peroneal artery, of uncertain acuity. 3. Calcified and noncalfied atheromatous plaque throughout the abdominal aorta and lower extremity vasculature, severe in bilateral trifurcations. NON-VASCULAR 1. No acute abnormality. 2. Colonic diverticulosis. Possible cholelithiasis or sludge in the gallbladder without complicating features. These results were called by telephone at the time of interpretation on 05/23/2017 at 9:22 pm to PA [facility] GAINES , who verbally acknowledged these results. Electronically Signed   By: Rubye OaksMelanie  Ehinger M.D.   On: 05/23/2017 21:23   Dg Femur Min 2 Views Left  Result Date: 05/23/2017 CLINICAL DATA:  Left leg pain after fall in shower 2 days ago. EXAM: LEFT FEMUR 2 VIEWS COMPARISON:  None. FINDINGS: Tricompartmental  osteoarthritis the knee. Press-Fit left hip arthroplasty with cemented femoral component. No acute fracture, suspicious osseous lesion nor joint dislocation. Slightly shallow appearing acetabular component that may predispose patient to joint dislocations. IMPRESSION: 1. No acute osseous abnormality of the left femur. 2. Tricompartmental osteoarthritis of the included left knee. 3. Intact left cemented Press-Fit arthroplasty of the hip. Electronically Signed   By: Tollie Ethavid  Kwon M.D.   On: 05/23/2017 18:18    Assessment and Plan:   Nedra Haihomas E Rini is a 83 y.o. y/o male has been referred for  elevated liver function tests most likely     The fact that they are rapidly improving suggests an acute hepatitis - the pattern of liver tests abnormalities of elevated transaminases usually seen in acute viral hepatitis or ischemic hepatitis( recent vascular procedure-the sedation could have caused brief hypotension  and ischemia to the liver ) or a drug reaction . ANA is negative but his RF and CCP antibody is positive. I do not think that indicates he has an autoimmune hepatitis as these antibodies are not used in the diagnosis of autoimmune hepatitis but could be related to another autoimmune condition as such as rheumatoid arthritis. He could be referred to rheumatology to evaluate further. I expect his transaminases to resolve rapidly and soon . I will closely follow him up . Apixaban is associated with abnormal LFT's but in <1 % . I do note his statin has been held.    Plan  1. Liver USG doppler to r/o PVT 2. Check EBV,HSV,CMV,EZV serology  3. LKM ab,ceruloplasmin, CK,GGT, INR, Immunoglobulins , celiac serology ,TSH,  Iron studies  4. Repeat LFT's in 10 days    Follow up in 2 weeks  Dr Wyline MoodKiran Bette Brienza MD,MRCP(U.K)

## 2017-06-18 NOTE — Telephone Encounter (Signed)
Patients daughter is aware

## 2017-06-18 NOTE — Telephone Encounter (Signed)
Left voicemail, giving verbal order

## 2017-06-18 NOTE — Assessment & Plan Note (Signed)
Refer to rheumatologist 

## 2017-06-18 NOTE — Assessment & Plan Note (Signed)
Refer to rheum 

## 2017-06-18 NOTE — Telephone Encounter (Signed)
I'm so glad patient saw the liver specialist today He recommended having patient see a rheumatologist so I've entered that referral Stay OFF of the cholesterol medicine for now until his liver returns to normal and we tell him it's okay to start back Thank you

## 2017-06-18 NOTE — Progress Notes (Signed)
Us/

## 2017-06-19 ENCOUNTER — Encounter: Payer: Self-pay | Admitting: Family Medicine

## 2017-06-19 ENCOUNTER — Ambulatory Visit: Payer: Medicare HMO | Admitting: Family Medicine

## 2017-06-19 VITALS — BP 124/68 | HR 66 | Temp 97.6°F

## 2017-06-19 DIAGNOSIS — I82432 Acute embolism and thrombosis of left popliteal vein: Secondary | ICD-10-CM | POA: Diagnosis not present

## 2017-06-19 DIAGNOSIS — R7989 Other specified abnormal findings of blood chemistry: Secondary | ICD-10-CM | POA: Diagnosis not present

## 2017-06-19 DIAGNOSIS — F0390 Unspecified dementia without behavioral disturbance: Secondary | ICD-10-CM | POA: Diagnosis not present

## 2017-06-19 DIAGNOSIS — M1712 Unilateral primary osteoarthritis, left knee: Secondary | ICD-10-CM | POA: Diagnosis not present

## 2017-06-19 DIAGNOSIS — R768 Other specified abnormal immunological findings in serum: Secondary | ICD-10-CM | POA: Diagnosis not present

## 2017-06-19 DIAGNOSIS — J181 Lobar pneumonia, unspecified organism: Secondary | ICD-10-CM | POA: Diagnosis not present

## 2017-06-19 DIAGNOSIS — J189 Pneumonia, unspecified organism: Secondary | ICD-10-CM

## 2017-06-19 DIAGNOSIS — K759 Inflammatory liver disease, unspecified: Secondary | ICD-10-CM | POA: Diagnosis not present

## 2017-06-19 DIAGNOSIS — L89620 Pressure ulcer of left heel, unstageable: Secondary | ICD-10-CM | POA: Diagnosis not present

## 2017-06-19 DIAGNOSIS — I129 Hypertensive chronic kidney disease with stage 1 through stage 4 chronic kidney disease, or unspecified chronic kidney disease: Secondary | ICD-10-CM | POA: Diagnosis not present

## 2017-06-19 DIAGNOSIS — I70262 Atherosclerosis of native arteries of extremities with gangrene, left leg: Secondary | ICD-10-CM | POA: Diagnosis not present

## 2017-06-19 DIAGNOSIS — I96 Gangrene, not elsewhere classified: Secondary | ICD-10-CM

## 2017-06-19 DIAGNOSIS — I252 Old myocardial infarction: Secondary | ICD-10-CM | POA: Diagnosis not present

## 2017-06-19 DIAGNOSIS — N183 Chronic kidney disease, stage 3 (moderate): Secondary | ICD-10-CM | POA: Diagnosis not present

## 2017-06-19 DIAGNOSIS — R82998 Other abnormal findings in urine: Secondary | ICD-10-CM

## 2017-06-19 DIAGNOSIS — M5136 Other intervertebral disc degeneration, lumbar region: Secondary | ICD-10-CM | POA: Diagnosis not present

## 2017-06-19 LAB — COMPLETE METABOLIC PANEL WITH GFR
AG RATIO: 0.8 (calc) — AB (ref 1.0–2.5)
ALBUMIN MSPROF: 3.1 g/dL — AB (ref 3.6–5.1)
ALKALINE PHOSPHATASE (APISO): 177 U/L — AB (ref 40–115)
ALT: 565 U/L — ABNORMAL HIGH (ref 9–46)
AST: 566 U/L — AB (ref 10–35)
BUN / CREAT RATIO: 26 (calc) — AB (ref 6–22)
BUN: 30 mg/dL — ABNORMAL HIGH (ref 7–25)
CO2: 27 mmol/L (ref 20–32)
Calcium: 9.5 mg/dL (ref 8.6–10.3)
Chloride: 101 mmol/L (ref 98–110)
Creat: 1.16 mg/dL — ABNORMAL HIGH (ref 0.70–1.11)
GFR, EST NON AFRICAN AMERICAN: 56 mL/min/{1.73_m2} — AB (ref >=60)
GFR, Est African American: 65 mL/min/{1.73_m2} (ref >=60)
GLOBULIN: 3.9 g/dL — AB (ref 1.9–3.7)
Glucose, Bld: 127 mg/dL — ABNORMAL HIGH (ref 65–99)
POTASSIUM: 4.2 mmol/L (ref 3.5–5.3)
SODIUM: 140 mmol/L (ref 135–146)
Total Bilirubin: 0.4 mg/dL (ref 0.2–1.2)
Total Protein: 7 g/dL (ref 6.1–8.1)

## 2017-06-19 LAB — ANA,IFA RA DIAG PNL W/RFLX TIT/PATN
ANA: NEGATIVE
Rhuematoid fact SerPl-aCnc: 15 IU/mL — ABNORMAL HIGH (ref <14)

## 2017-06-19 LAB — HEPATITIS PANEL, ACUTE
HEP A IGM: NONREACTIVE
HEP B S AG: NONREACTIVE
Hep B C IgM: NONREACTIVE
Hepatitis C Ab: NONREACTIVE
SIGNAL TO CUT-OFF: 0.13 (ref <1.00)

## 2017-06-19 LAB — SMOOTH MUSC W/REFL: SMOOTH MUSCLE AB SCREEN: NEGATIVE

## 2017-06-19 NOTE — Patient Instructions (Addendum)
Keep a close eye on the gangrene and ulcers and please go right to the emergency room if getting worse Please do call Dr. Wyn Quakerew and let him know about the ulcer on the heel and the discoloration around the other gangrenous ulcers on both toes I'll suggest a sooner appointment with Dr. Wyn Quakerew for this Do keep the appointment with the foot doctor Goal LDL is less than 70 Try to limit saturated fats in your diet (bologna, hot dogs, barbeque, cheeseburgers, hamburgers, steak, bacon, sausage, cheese, etc.) and get more fresh fruits, vegetables, and whole grains Do see the rheumatologist, but that is less pressing right now

## 2017-06-19 NOTE — Progress Notes (Signed)
BP 124/68 (BP Location: Left Arm, Patient Position: Sitting, Cuff Size: Large)   Pulse 66   Temp 97.6 F (36.4 C) (Oral)   SpO2 98%    Subjective:    Patient ID: [name] [name], male    DOB: 04-Jan-1928, 83 y.o.   MRN: [id number]  HPI: [name] Haihomas E Baehr is a 83 y.o. male  Chief Complaint  Patient presents with  . Follow-up    Wife concerns that he may have a UTI or be dehydrated due to dark urine color, wife has also noticed bruise on the back of left leg      HPI Patient is here with daughter Concerned about gangrene on the left foot; ulcers heel and great toe and pinky toe; they do hurt, about the same as before; more discoloration around the sites; wife using vaseline to keep it from sticking to the sock He has seen Dr. Wyn Quakerew this week about the gangrene, so he has looked at them No fevers No abdominal pain whatsoever Never really ate well, not a big eater, never was Seeing Dr. Tobi BastosAnna and goes for scan and labs tomorrow He is getting more liver testing tomorrow Not taking the gabapentin or cholesterol No known fam hx of RA; positive CCP Rheum referral was entered He has pneumonia, on levafloxacin; CXR from 06/14/17 reviewed; RUL opacity; seldom coughs now; nonproductive  Depression screen [facility] 2/9 06/19/2017 06/16/2017 03/24/2017 11/19/2016 10/08/2016  Decreased Interest 0 0 0 0 0  Down, Depressed, Hopeless 0 0 0 0 0  PHQ - 2 Score 0 0 0 0 0    Relevant past medical, surgical, family and social history reviewed Past Medical History:  Diagnosis Date  . Arthritis   . CKD (chronic kidney disease) stage 3, GFR 30-59 ml/min (HCC) 12/29/2015  . Dry gangrene (HCC) 06/16/2017   Great toe and 5th toe LEFT foot; Dr. Wyn Quakerew  . Hyperlipidemia   . Hypertension   . Left leg DVT (HCC) 01/2014  . Myocardial infarction (HCC)   . Pulmonary embolism (HCC) 01/2014  . Thoracic aorta atherosclerosis (HCC) 10/08/2016   Past Surgical History:  Procedure Laterality Date  . CORONARY STENT  PLACEMENT  2006  . JOINT REPLACEMENT  2011 & 2012   both hips  . len replaced Left 2015  . LOWER EXTREMITY ANGIOGRAPHY N/A 05/26/2017   Procedure: Lower Extremity Angiography;  Surgeon: Annice Needyew, Jason S, MD;  Location: [facility] INVASIVE CV LAB;  Service: Cardiovascular;  Laterality: N/A;  . MELANOMA EXCISION     40 years ago   Family History  Problem Relation Age of Onset  . Arthritis Mother   . Arthritis Father   . Depression Father    Social History   Tobacco Use  . Smoking status: Former Smoker    Types: Pipe  . Smokeless tobacco: Never Used  Substance Use Topics  . Alcohol use: No    Alcohol/week: 0.0 oz  . Drug use: No    Interim medical history since last visit reviewed. Allergies and medications reviewed  Review of Systems Per HPI unless specifically indicated above     Objective:    BP 124/68 (BP Location: Left Arm, Patient Position: Sitting, Cuff Size: Large)   Pulse 66   Temp 97.6 F (36.4 C) (Oral)   SpO2 98%   Wt Readings from Last 3 Encounters:  06/18/17 149 lb 6.4 oz (67.8 kg)  06/13/17 145 lb (65.8 kg)  05/26/17 150 lb (68 kg)    Physical Exam  Constitutional:  He appears well-developed and well-nourished. No distress.  Elderly frail-appearing male, seated in wheelchair; gait not assessed; not coughing today; nontoxic  Eyes: No scleral icterus.  Cardiovascular: Normal rate and regular rhythm.  Pulses:      Dorsalis pedis pulses are 2+ on the right side, and 2+ on the left side.  black necrosis/dry gangrene of tip of LEFT great toe, LEFT pinky toe; surrounding the pinky toe, there is extensive erythema; no active drainage; no proximal erythema; no induration; no odor; there is also erythema and ulcer of the LEFT heel that is either new or not noted on previous exam  Pulmonary/Chest: Effort normal. No accessory muscle usage. No respiratory distress. He has no decreased breath sounds. He has no wheezes. He has no rhonchi.  Abdominal: Soft. Normal appearance  and bowel sounds are normal. There is no hepatosplenomegaly. There is no tenderness.  Musculoskeletal:  No erythema or enlargement or ulnar deviation of the MCPs  Neurological: He is alert.  Skin: He is not diaphoretic. No pallor.  No jaundice  Psychiatric: He has a normal mood and affect. His mood appears not anxious. He does not exhibit a depressed mood.      Assessment & Plan:   Problem List Items Addressed This Visit      Other   Rheumatoid factor positive    Referred to rheumatologist      Dry gangrene (HCC) - Primary    I believe the great toe and pinky toe areas have worsened; my staff got on the phone and tried to explain findings and get podiatrist to see him sooner; they were not able to work him in; discussed in detail with patient and his daughter that I want his vascular doctor to be aware of this worsening and for patient to go back to the hospital if this gets worse; I am concerned he could lose his toe, if not his foot; they agree      Cyclic citrullinated peptide (CCP) antibody positive    Explained lab results with patient and daughter; GI does not believe this is a false positive related to GI process, so will have him see rheumatologist       Other Visit Diagnoses    Dark urine       Relevant Orders   Urinalysis w microscopic + reflex cultur   Pneumonia of right upper lobe due to infectious organism (HCC)       sounds to be resolving clinically   Hepatitis       not sure if due to gabapentin or ischemic; seeing GI; avoid hepatotoxic agents       Follow up plan: No Follow-up on file.  An after-visit summary was printed and given to the patient at check-out.  Please see the patient instructions which may contain other information and recommendations beyond what is mentioned above in the assessment and plan.  No orders of the defined types were placed in this encounter.   Orders Placed This Encounter  Procedures  . Urinalysis w microscopic + reflex  cultur

## 2017-06-20 ENCOUNTER — Ambulatory Visit
Admission: RE | Admit: 2017-06-20 | Discharge: 2017-06-20 | Disposition: A | Discharge status: Home / Self Care | Payer: Medicare HMO | Source: Ambulatory Visit | Attending: Family Medicine | Admitting: Family Medicine

## 2017-06-20 ENCOUNTER — Telehealth: Payer: Self-pay

## 2017-06-20 ENCOUNTER — Other Ambulatory Visit
Admission: RE | Admit: 2017-06-20 | Discharge: 2017-06-20 | Disposition: A | Discharge status: Home / Self Care | Payer: Medicare HMO | Source: Ambulatory Visit | Attending: Gastroenterology | Admitting: Gastroenterology

## 2017-06-20 ENCOUNTER — Ambulatory Visit: Payer: Medicare HMO

## 2017-06-20 DIAGNOSIS — R945 Abnormal results of liver function studies: Secondary | ICD-10-CM

## 2017-06-20 DIAGNOSIS — R7989 Other specified abnormal findings of blood chemistry: Secondary | ICD-10-CM

## 2017-06-20 DIAGNOSIS — R82998 Other abnormal findings in urine: Secondary | ICD-10-CM | POA: Diagnosis not present

## 2017-06-20 DIAGNOSIS — R748 Abnormal levels of other serum enzymes: Secondary | ICD-10-CM

## 2017-06-20 DIAGNOSIS — R7401 Elevation of levels of liver transaminase levels: Secondary | ICD-10-CM

## 2017-06-20 DIAGNOSIS — R740 Nonspecific elevation of levels of transaminase and lactic acid dehydrogenase [LDH]: Principal | ICD-10-CM

## 2017-06-20 LAB — IRON AND TIBC
IRON: 93 ug/dL (ref 45–182)
SATURATION RATIOS: 24 % (ref 17.9–39.5)
TIBC: 392 ug/dL (ref 250–450)
UIBC: 299 ug/dL

## 2017-06-20 LAB — RAPID HIV SCREEN (HIV 1/2 AB+AG)
HIV 1/2 Antibodies: NONREACTIVE
HIV-1 P24 ANTIGEN - HIV24: NONREACTIVE

## 2017-06-20 LAB — URINALYSIS, ROUTINE W REFLEX MICROSCOPIC
BILIRUBIN URINE: NEGATIVE
GLUCOSE, UA: NEGATIVE mg/dL
HGB URINE DIPSTICK: NEGATIVE
KETONES UR: NEGATIVE mg/dL
Leukocytes, UA: NEGATIVE
NITRITE: NEGATIVE
PH: 5 (ref 5.0–8.0)
Protein, ur: NEGATIVE mg/dL
SPECIFIC GRAVITY, URINE: 1.024 (ref 1.005–1.030)

## 2017-06-20 LAB — CKMB (ARMC ONLY): CK, MB: 1.3 ng/mL (ref 0.5–5.0)

## 2017-06-20 LAB — PROTIME-INR
INR: 1.31
Prothrombin Time: 16.2 seconds — ABNORMAL HIGH (ref 11.4–15.2)

## 2017-06-20 LAB — FERRITIN: FERRITIN: 116 ng/mL (ref 24–336)

## 2017-06-20 LAB — TSH: TSH: 3.047 u[IU]/mL (ref 0.350–4.500)

## 2017-06-20 NOTE — Telephone Encounter (Signed)
That is fine.   Thank you

## 2017-06-20 NOTE — Telephone Encounter (Signed)
Left voicemail with verbal order to Aldrin   Copied from CRM [id number]. Topic: General - Other >> Jun 20, 2017  8:29 AM Percival SpanishKennedy, Cheryl W wrote:  [facility] ask for verbal orders for PT 2 times a week for 4 weeks   [phone]

## 2017-06-23 ENCOUNTER — Other Ambulatory Visit
Admission: RE | Admit: 2017-06-23 | Discharge: 2017-06-23 | Disposition: A | Discharge status: Home / Self Care | Payer: Medicare HMO | Source: Ambulatory Visit | Attending: Family Medicine | Admitting: Family Medicine

## 2017-06-23 ENCOUNTER — Ambulatory Visit
Admission: RE | Admit: 2017-06-23 | Discharge: 2017-06-23 | Disposition: A | Discharge status: Home / Self Care | Payer: Medicare HMO | Source: Ambulatory Visit | Attending: Family Medicine | Admitting: Family Medicine

## 2017-06-23 ENCOUNTER — Other Ambulatory Visit
Admission: RE | Admit: 2017-06-23 | Discharge: 2017-06-23 | Disposition: A | Discharge status: Home / Self Care | Payer: Medicare HMO | Source: Ambulatory Visit | Attending: Gastroenterology | Admitting: Gastroenterology

## 2017-06-23 ENCOUNTER — Telehealth: Payer: Self-pay

## 2017-06-23 DIAGNOSIS — R7989 Other specified abnormal findings of blood chemistry: Secondary | ICD-10-CM | POA: Diagnosis not present

## 2017-06-23 DIAGNOSIS — R748 Abnormal levels of other serum enzymes: Secondary | ICD-10-CM | POA: Insufficient documentation

## 2017-06-23 DIAGNOSIS — R740 Nonspecific elevation of levels of transaminase and lactic acid dehydrogenase [LDH]: Secondary | ICD-10-CM | POA: Diagnosis not present

## 2017-06-23 DIAGNOSIS — K8020 Calculus of gallbladder without cholecystitis without obstruction: Secondary | ICD-10-CM | POA: Diagnosis not present

## 2017-06-23 NOTE — Assessment & Plan Note (Signed)
Explained lab results with patient and daughter; GI does not believe this is a false positive related to GI process, so will have him see rheumatologist

## 2017-06-23 NOTE — Assessment & Plan Note (Signed)
I believe the great toe and pinky toe areas have worsened; my staff got on the phone and tried to explain findings and get podiatrist to see him sooner; they were not able to work him in; discussed in detail with patient and his daughter that I want his vascular doctor to be aware of this worsening and for patient to go back to the hospital if this gets worse; I am concerned he could lose his toe, if not his foot; they agree

## 2017-06-23 NOTE — Assessment & Plan Note (Signed)
Referred to rheumatologist

## 2017-06-23 NOTE — Telephone Encounter (Signed)
Called pt's daughter (caregiver) no answer. LM for her informing her of information below. To call back for questions or concerns. CRM created. [facility]

## 2017-06-23 NOTE — Telephone Encounter (Signed)
-----   Message from Kerman PasseyMelinda P Lada, MD sent at 06/23/2017 11:27 AM EST ----- Please let patient know that his ultrasound shows small gallstones and sludge in his gallbladder; there is no active infection, though The US does not show any worrisome masses; it does show either fatty liver, cirrhosis, or another process, so we'll have him continue to see the gastroenterologist

## 2017-06-25 ENCOUNTER — Telehealth: Payer: Self-pay

## 2017-06-25 DIAGNOSIS — M1712 Unilateral primary osteoarthritis, left knee: Secondary | ICD-10-CM | POA: Diagnosis not present

## 2017-06-25 DIAGNOSIS — I82432 Acute embolism and thrombosis of left popliteal vein: Secondary | ICD-10-CM | POA: Diagnosis not present

## 2017-06-25 DIAGNOSIS — L89620 Pressure ulcer of left heel, unstageable: Secondary | ICD-10-CM | POA: Diagnosis not present

## 2017-06-25 DIAGNOSIS — I252 Old myocardial infarction: Secondary | ICD-10-CM | POA: Diagnosis not present

## 2017-06-25 DIAGNOSIS — I70262 Atherosclerosis of native arteries of extremities with gangrene, left leg: Secondary | ICD-10-CM | POA: Diagnosis not present

## 2017-06-25 DIAGNOSIS — M5136 Other intervertebral disc degeneration, lumbar region: Secondary | ICD-10-CM | POA: Diagnosis not present

## 2017-06-25 DIAGNOSIS — N183 Chronic kidney disease, stage 3 (moderate): Secondary | ICD-10-CM | POA: Diagnosis not present

## 2017-06-25 DIAGNOSIS — F0390 Unspecified dementia without behavioral disturbance: Secondary | ICD-10-CM | POA: Diagnosis not present

## 2017-06-25 DIAGNOSIS — I129 Hypertensive chronic kidney disease with stage 1 through stage 4 chronic kidney disease, or unspecified chronic kidney disease: Secondary | ICD-10-CM | POA: Diagnosis not present

## 2017-06-25 NOTE — Telephone Encounter (Signed)
-----   Message from Wyline MoodKiran Anna, MD sent at 06/23/2017 12:04 PM EST ----- Marcelino DusterMichelle please inform doppler results are normal   C/c Lada, Janit BernMelinda P, MD

## 2017-06-25 NOTE — Telephone Encounter (Signed)
Pts daughter [name] has been informed her fathers doppler is normal. She said she will try again to have his labs done.  Thanks [employer]le

## 2017-06-27 ENCOUNTER — Ambulatory Visit: Payer: Medicare HMO | Admitting: Podiatry

## 2017-06-27 DIAGNOSIS — N183 Chronic kidney disease, stage 3 (moderate): Secondary | ICD-10-CM | POA: Diagnosis not present

## 2017-06-27 DIAGNOSIS — I129 Hypertensive chronic kidney disease with stage 1 through stage 4 chronic kidney disease, or unspecified chronic kidney disease: Secondary | ICD-10-CM | POA: Diagnosis not present

## 2017-06-27 DIAGNOSIS — I82432 Acute embolism and thrombosis of left popliteal vein: Secondary | ICD-10-CM | POA: Diagnosis not present

## 2017-06-27 DIAGNOSIS — F0390 Unspecified dementia without behavioral disturbance: Secondary | ICD-10-CM | POA: Diagnosis not present

## 2017-06-27 DIAGNOSIS — I252 Old myocardial infarction: Secondary | ICD-10-CM | POA: Diagnosis not present

## 2017-06-27 DIAGNOSIS — M5136 Other intervertebral disc degeneration, lumbar region: Secondary | ICD-10-CM | POA: Diagnosis not present

## 2017-06-27 DIAGNOSIS — I70262 Atherosclerosis of native arteries of extremities with gangrene, left leg: Secondary | ICD-10-CM | POA: Diagnosis not present

## 2017-06-27 DIAGNOSIS — L89620 Pressure ulcer of left heel, unstageable: Secondary | ICD-10-CM | POA: Diagnosis not present

## 2017-06-27 DIAGNOSIS — M1712 Unilateral primary osteoarthritis, left knee: Secondary | ICD-10-CM | POA: Diagnosis not present

## 2017-07-02 DIAGNOSIS — M1712 Unilateral primary osteoarthritis, left knee: Secondary | ICD-10-CM | POA: Diagnosis not present

## 2017-07-02 DIAGNOSIS — I70262 Atherosclerosis of native arteries of extremities with gangrene, left leg: Secondary | ICD-10-CM | POA: Diagnosis not present

## 2017-07-02 DIAGNOSIS — I252 Old myocardial infarction: Secondary | ICD-10-CM | POA: Diagnosis not present

## 2017-07-02 DIAGNOSIS — L89620 Pressure ulcer of left heel, unstageable: Secondary | ICD-10-CM | POA: Diagnosis not present

## 2017-07-02 DIAGNOSIS — I129 Hypertensive chronic kidney disease with stage 1 through stage 4 chronic kidney disease, or unspecified chronic kidney disease: Secondary | ICD-10-CM | POA: Diagnosis not present

## 2017-07-02 DIAGNOSIS — N183 Chronic kidney disease, stage 3 (moderate): Secondary | ICD-10-CM | POA: Diagnosis not present

## 2017-07-02 DIAGNOSIS — F0390 Unspecified dementia without behavioral disturbance: Secondary | ICD-10-CM | POA: Diagnosis not present

## 2017-07-02 DIAGNOSIS — M5136 Other intervertebral disc degeneration, lumbar region: Secondary | ICD-10-CM | POA: Diagnosis not present

## 2017-07-02 DIAGNOSIS — I82432 Acute embolism and thrombosis of left popliteal vein: Secondary | ICD-10-CM | POA: Diagnosis not present

## 2017-07-03 ENCOUNTER — Other Ambulatory Visit (INDEPENDENT_AMBULATORY_CARE_PROVIDER_SITE_OTHER): Payer: Self-pay

## 2017-07-03 DIAGNOSIS — I82432 Acute embolism and thrombosis of left popliteal vein: Secondary | ICD-10-CM | POA: Diagnosis not present

## 2017-07-03 DIAGNOSIS — M5136 Other intervertebral disc degeneration, lumbar region: Secondary | ICD-10-CM | POA: Diagnosis not present

## 2017-07-03 DIAGNOSIS — I70262 Atherosclerosis of native arteries of extremities with gangrene, left leg: Secondary | ICD-10-CM | POA: Diagnosis not present

## 2017-07-03 DIAGNOSIS — L89620 Pressure ulcer of left heel, unstageable: Secondary | ICD-10-CM | POA: Diagnosis not present

## 2017-07-03 DIAGNOSIS — N183 Chronic kidney disease, stage 3 (moderate): Secondary | ICD-10-CM | POA: Diagnosis not present

## 2017-07-03 DIAGNOSIS — I252 Old myocardial infarction: Secondary | ICD-10-CM | POA: Diagnosis not present

## 2017-07-03 DIAGNOSIS — I129 Hypertensive chronic kidney disease with stage 1 through stage 4 chronic kidney disease, or unspecified chronic kidney disease: Secondary | ICD-10-CM | POA: Diagnosis not present

## 2017-07-03 DIAGNOSIS — F0390 Unspecified dementia without behavioral disturbance: Secondary | ICD-10-CM | POA: Diagnosis not present

## 2017-07-03 DIAGNOSIS — M1712 Unilateral primary osteoarthritis, left knee: Secondary | ICD-10-CM | POA: Diagnosis not present

## 2017-07-03 MED ORDER — APIXABAN 5 MG PO TABS: 10.0000 mg | ORAL_TABLET | Freq: Two times a day (BID) | ORAL | 5 refills | Status: DC

## 2017-07-04 DIAGNOSIS — I129 Hypertensive chronic kidney disease with stage 1 through stage 4 chronic kidney disease, or unspecified chronic kidney disease: Secondary | ICD-10-CM | POA: Diagnosis not present

## 2017-07-04 DIAGNOSIS — I70262 Atherosclerosis of native arteries of extremities with gangrene, left leg: Secondary | ICD-10-CM | POA: Diagnosis not present

## 2017-07-04 DIAGNOSIS — M1712 Unilateral primary osteoarthritis, left knee: Secondary | ICD-10-CM | POA: Diagnosis not present

## 2017-07-04 DIAGNOSIS — N183 Chronic kidney disease, stage 3 (moderate): Secondary | ICD-10-CM | POA: Diagnosis not present

## 2017-07-04 DIAGNOSIS — M5136 Other intervertebral disc degeneration, lumbar region: Secondary | ICD-10-CM | POA: Diagnosis not present

## 2017-07-04 DIAGNOSIS — I82432 Acute embolism and thrombosis of left popliteal vein: Secondary | ICD-10-CM | POA: Diagnosis not present

## 2017-07-04 DIAGNOSIS — L89620 Pressure ulcer of left heel, unstageable: Secondary | ICD-10-CM | POA: Diagnosis not present

## 2017-07-04 DIAGNOSIS — I252 Old myocardial infarction: Secondary | ICD-10-CM | POA: Diagnosis not present

## 2017-07-04 DIAGNOSIS — F0390 Unspecified dementia without behavioral disturbance: Secondary | ICD-10-CM | POA: Diagnosis not present

## 2017-07-07 ENCOUNTER — Telehealth: Payer: Self-pay | Admitting: Gastroenterology

## 2017-07-07 ENCOUNTER — Other Ambulatory Visit: Payer: Self-pay

## 2017-07-07 ENCOUNTER — Ambulatory Visit: Payer: Medicare HMO | Admitting: Gastroenterology

## 2017-07-07 DIAGNOSIS — R945 Abnormal results of liver function studies: Secondary | ICD-10-CM

## 2017-07-07 DIAGNOSIS — R7989 Other specified abnormal findings of blood chemistry: Secondary | ICD-10-CM

## 2017-07-07 NOTE — Telephone Encounter (Signed)
Left voice message at home and cell for patient to cancel appt today and per Panya go to the hospital for more labs. After to please call office to reschedule a day or two after.

## 2017-07-08 ENCOUNTER — Encounter: Payer: Self-pay | Admitting: Podiatry

## 2017-07-08 ENCOUNTER — Other Ambulatory Visit: Payer: Self-pay | Admitting: Podiatry

## 2017-07-08 ENCOUNTER — Ambulatory Visit (INDEPENDENT_AMBULATORY_CARE_PROVIDER_SITE_OTHER): Payer: Medicare HMO

## 2017-07-08 ENCOUNTER — Ambulatory Visit: Payer: Medicare HMO | Admitting: Podiatry

## 2017-07-08 DIAGNOSIS — I70262 Atherosclerosis of native arteries of extremities with gangrene, left leg: Secondary | ICD-10-CM | POA: Diagnosis not present

## 2017-07-08 DIAGNOSIS — L97522 Non-pressure chronic ulcer of other part of left foot with fat layer exposed: Secondary | ICD-10-CM

## 2017-07-08 DIAGNOSIS — L97529 Non-pressure chronic ulcer of other part of left foot with unspecified severity: Secondary | ICD-10-CM

## 2017-07-08 MED ORDER — GENTAMICIN SULFATE 0.1 % EX CREA: 1.0000 "application " | TOPICAL_CREAM | Freq: Three times a day (TID) | CUTANEOUS | 1 refills | Status: DC

## 2017-07-09 ENCOUNTER — Other Ambulatory Visit
Admission: RE | Admit: 2017-07-09 | Discharge: 2017-07-09 | Disposition: A | Discharge status: Home / Self Care | Payer: Medicare HMO | Source: Ambulatory Visit | Attending: Gastroenterology | Admitting: Gastroenterology

## 2017-07-09 DIAGNOSIS — R945 Abnormal results of liver function studies: Secondary | ICD-10-CM | POA: Insufficient documentation

## 2017-07-09 DIAGNOSIS — I252 Old myocardial infarction: Secondary | ICD-10-CM | POA: Diagnosis not present

## 2017-07-09 DIAGNOSIS — I129 Hypertensive chronic kidney disease with stage 1 through stage 4 chronic kidney disease, or unspecified chronic kidney disease: Secondary | ICD-10-CM | POA: Diagnosis not present

## 2017-07-09 DIAGNOSIS — N183 Chronic kidney disease, stage 3 (moderate): Secondary | ICD-10-CM | POA: Diagnosis not present

## 2017-07-09 DIAGNOSIS — M5136 Other intervertebral disc degeneration, lumbar region: Secondary | ICD-10-CM | POA: Diagnosis not present

## 2017-07-09 DIAGNOSIS — M1712 Unilateral primary osteoarthritis, left knee: Secondary | ICD-10-CM | POA: Diagnosis not present

## 2017-07-09 DIAGNOSIS — L89620 Pressure ulcer of left heel, unstageable: Secondary | ICD-10-CM | POA: Diagnosis not present

## 2017-07-09 DIAGNOSIS — F0390 Unspecified dementia without behavioral disturbance: Secondary | ICD-10-CM | POA: Diagnosis not present

## 2017-07-09 DIAGNOSIS — I70262 Atherosclerosis of native arteries of extremities with gangrene, left leg: Secondary | ICD-10-CM | POA: Diagnosis not present

## 2017-07-09 DIAGNOSIS — I82432 Acute embolism and thrombosis of left popliteal vein: Secondary | ICD-10-CM | POA: Diagnosis not present

## 2017-07-09 LAB — COMPREHENSIVE METABOLIC PANEL
ALT: 21 U/L (ref 17–63)
AST: 30 U/L (ref 15–41)
Albumin: 3.3 g/dL — ABNORMAL LOW (ref 3.5–5.0)
Alkaline Phosphatase: 97 U/L (ref 38–126)
Anion gap: 9 (ref 5–15)
BILIRUBIN TOTAL: 0.5 mg/dL (ref 0.3–1.2)
BUN: 20 mg/dL (ref 6–20)
CO2: 26 mmol/L (ref 22–32)
Calcium: 9.3 mg/dL (ref 8.9–10.3)
Chloride: 101 mmol/L (ref 101–111)
Creatinine, Ser: 1.27 mg/dL — ABNORMAL HIGH (ref 0.61–1.24)
GFR calc Af Amer: 57 mL/min — ABNORMAL LOW (ref >60)
GFR, EST NON AFRICAN AMERICAN: 49 mL/min — AB (ref >60)
Glucose, Bld: 105 mg/dL — ABNORMAL HIGH (ref 65–99)
POTASSIUM: 4 mmol/L (ref 3.5–5.1)
Sodium: 136 mmol/L (ref 135–145)
TOTAL PROTEIN: 7.6 g/dL (ref 6.5–8.1)

## 2017-07-10 NOTE — Progress Notes (Signed)
   Subjective: 84 year old male presenting today as a new patient with a chief complaint of skin lesions to the left great and fifth toes that have been present for several weeks. He states he was seen at the ED in late November and was diagnosed with dry gangrene of the left fifth toe. He is also being treated by vascular for blood flow problems of the BLE. Patient is here for further evaluation and treatment.    Past Medical History:  Diagnosis Date  . Arthritis   . CKD (chronic kidney disease) stage 3, GFR 30-59 ml/min (HCC) 12/29/2015  . Dry gangrene (HCC) 06/16/2017   Great toe and 5th toe LEFT foot; Dr. Wyn Quakerew  . Hyperlipidemia   . Hypertension   . Left leg DVT (HCC) 01/2014  . Myocardial infarction (HCC)   . Pulmonary embolism (HCC) 01/2014  . Thoracic aorta atherosclerosis (HCC) 10/08/2016     Objective/Physical Exam General: The patient is alert and oriented x3 in no acute distress.  Dermatology:  Wound #1 noted to the left fifth toe measuring 1.0 x 0.8 x 0.1 cm (LxWxD).   To the noted ulceration(s), there is no eschar. There is a moderate amount of slough, fibrin, and necrotic tissue noted. Granulation tissue and wound base is red. There is a minimal amount of serosanguineous drainage noted. There is no exposed bone muscle-tendon ligament or joint. There is no malodor. Periwound integrity is intact. Skin is warm, dry and supple bilateral lower extremities.  Vascular: Palpable pedal pulses bilaterally. Mild edema noted. Capillary refill within normal limits. Varicosities noted bilateral lower extremities.   Neurological: Epicritic and protective threshold absent bilaterally.   Musculoskeletal Exam: Range of motion within normal limits to all pedal and ankle joints bilateral. Muscle strength 5/5 in all groups bilateral.   Radiographic Exam:  Normal osseous mineralization. Joint spaces preserved. No fracture/dislocation/boney destruction.   Assessment: #1 ulceration of the left  fifth toe secondary to venous insufficiency #2 PVD BLE  Plan of Care:  #1 Patient was evaluated. X-Rays reviewed.  #2 medically necessary excisional debridement including subcutaneous tissue was performed using a tissue nipper and a chisel blade. Excisional debridement of all the necrotic nonviable tissue down to healthy bleeding viable tissue was performed with post-debridement measurements same as pre-. #3 the wound was cleansed with normal saline and dressed with a dry sterile dressing. #4 Prescription for gentamicin cream for daily used with a Band-Aid provided to patient.  #5 patient is to return to clinic in 3 weeks.  Daughter's name is [name].  Felecia ShellingBrent M. Shenia Alan, DPM [facility]  Dr. Felecia ShellingBrent M. Bodey Frizell, DPM    [address]                                        [place], [place] [address]                Office [phone]  Fax [phone]

## 2017-07-11 DIAGNOSIS — M1712 Unilateral primary osteoarthritis, left knee: Secondary | ICD-10-CM | POA: Diagnosis not present

## 2017-07-11 DIAGNOSIS — I70262 Atherosclerosis of native arteries of extremities with gangrene, left leg: Secondary | ICD-10-CM | POA: Diagnosis not present

## 2017-07-11 DIAGNOSIS — I252 Old myocardial infarction: Secondary | ICD-10-CM | POA: Diagnosis not present

## 2017-07-11 DIAGNOSIS — F0390 Unspecified dementia without behavioral disturbance: Secondary | ICD-10-CM | POA: Diagnosis not present

## 2017-07-11 DIAGNOSIS — M5136 Other intervertebral disc degeneration, lumbar region: Secondary | ICD-10-CM | POA: Diagnosis not present

## 2017-07-11 DIAGNOSIS — I82432 Acute embolism and thrombosis of left popliteal vein: Secondary | ICD-10-CM | POA: Diagnosis not present

## 2017-07-11 DIAGNOSIS — I129 Hypertensive chronic kidney disease with stage 1 through stage 4 chronic kidney disease, or unspecified chronic kidney disease: Secondary | ICD-10-CM | POA: Diagnosis not present

## 2017-07-11 DIAGNOSIS — N183 Chronic kidney disease, stage 3 (moderate): Secondary | ICD-10-CM | POA: Diagnosis not present

## 2017-07-11 DIAGNOSIS — L89620 Pressure ulcer of left heel, unstageable: Secondary | ICD-10-CM | POA: Diagnosis not present

## 2017-07-14 DIAGNOSIS — I70262 Atherosclerosis of native arteries of extremities with gangrene, left leg: Secondary | ICD-10-CM | POA: Diagnosis not present

## 2017-07-14 DIAGNOSIS — M5136 Other intervertebral disc degeneration, lumbar region: Secondary | ICD-10-CM | POA: Diagnosis not present

## 2017-07-14 DIAGNOSIS — I129 Hypertensive chronic kidney disease with stage 1 through stage 4 chronic kidney disease, or unspecified chronic kidney disease: Secondary | ICD-10-CM | POA: Diagnosis not present

## 2017-07-14 DIAGNOSIS — I252 Old myocardial infarction: Secondary | ICD-10-CM | POA: Diagnosis not present

## 2017-07-14 DIAGNOSIS — L89620 Pressure ulcer of left heel, unstageable: Secondary | ICD-10-CM | POA: Diagnosis not present

## 2017-07-14 DIAGNOSIS — M1712 Unilateral primary osteoarthritis, left knee: Secondary | ICD-10-CM | POA: Diagnosis not present

## 2017-07-14 DIAGNOSIS — F0390 Unspecified dementia without behavioral disturbance: Secondary | ICD-10-CM | POA: Diagnosis not present

## 2017-07-14 DIAGNOSIS — N183 Chronic kidney disease, stage 3 (moderate): Secondary | ICD-10-CM | POA: Diagnosis not present

## 2017-07-14 DIAGNOSIS — I82432 Acute embolism and thrombosis of left popliteal vein: Secondary | ICD-10-CM | POA: Diagnosis not present

## 2017-07-17 ENCOUNTER — Encounter: Payer: Self-pay | Admitting: Gastroenterology

## 2017-07-17 ENCOUNTER — Ambulatory Visit: Payer: Medicare HMO | Admitting: Gastroenterology

## 2017-07-17 VITALS — BP 143/82 | HR 87 | Temp 97.8°F | Ht 60.0 in | Wt 149.2 lb

## 2017-07-17 DIAGNOSIS — M5136 Other intervertebral disc degeneration, lumbar region: Secondary | ICD-10-CM | POA: Diagnosis not present

## 2017-07-17 DIAGNOSIS — R7989 Other specified abnormal findings of blood chemistry: Secondary | ICD-10-CM

## 2017-07-17 DIAGNOSIS — L89620 Pressure ulcer of left heel, unstageable: Secondary | ICD-10-CM | POA: Diagnosis not present

## 2017-07-17 DIAGNOSIS — F0390 Unspecified dementia without behavioral disturbance: Secondary | ICD-10-CM | POA: Diagnosis not present

## 2017-07-17 DIAGNOSIS — M1712 Unilateral primary osteoarthritis, left knee: Secondary | ICD-10-CM | POA: Diagnosis not present

## 2017-07-17 DIAGNOSIS — R945 Abnormal results of liver function studies: Secondary | ICD-10-CM | POA: Diagnosis not present

## 2017-07-17 DIAGNOSIS — I70262 Atherosclerosis of native arteries of extremities with gangrene, left leg: Secondary | ICD-10-CM | POA: Diagnosis not present

## 2017-07-17 DIAGNOSIS — N183 Chronic kidney disease, stage 3 (moderate): Secondary | ICD-10-CM | POA: Diagnosis not present

## 2017-07-17 DIAGNOSIS — I129 Hypertensive chronic kidney disease with stage 1 through stage 4 chronic kidney disease, or unspecified chronic kidney disease: Secondary | ICD-10-CM | POA: Diagnosis not present

## 2017-07-17 DIAGNOSIS — I82432 Acute embolism and thrombosis of left popliteal vein: Secondary | ICD-10-CM | POA: Diagnosis not present

## 2017-07-17 DIAGNOSIS — I252 Old myocardial infarction: Secondary | ICD-10-CM | POA: Diagnosis not present

## 2017-07-17 NOTE — Progress Notes (Signed)
Jonathon Bellows MD, MRCP(U.K) [place]  [place], [place] [address]  Main: [phone]  Fax: [phone]   Primary Care Physician: Arnetha Courser, MD  Primary Gastroenterologist:  Dr. Jonathon Bellows   No chief complaint on file.   HPI: [name] [name] is a 84 y.o. male   Summary of history :  He is here today to follow up for abnormal LFTs which he was initially seen and evaluated on 06/18/17. First noted abnormal labs 06/14/17 .Labs 12/171/18 -acute hepatitis Panel negative,ANA negative, Rheumatoid factor and CCP ab is positive.At that time the LFT's were improving and my impression was that the  pattern of liver tests abnormalities of elevated transaminases usually seen in acute viral hepatitis or ischemic hepatitis( recent vascular procedure-the sedation could have caused brief hypotension and ischemia to the liver ) or a drug reaction .Apixaban is associated with abnormal LFT's but in <1 % .  Interval history   06/18/2017-  07/17/2016    USG doppler was normal with no thrombosis. Chololithiasis and small amount of sludge seen in the gall bladder.  Labs 06/20/17- HIV, INR, iron studies, TSH-normal    Hepatic Function Latest Ref Rng & Units 07/09/2017 06/16/2017 06/14/2017  Total Protein 6.5 - 8.1 g/dL 7.6 7.0 8.0  Albumin 3.5 - 5.0 g/dL 3.3(L) - 2.6(L)  AST 15 - 41 U/L 30 566(H) 820(H)  ALT 17 - 63 U/L 21 565(H) 358(H)  Alk Phosphatase 38 - 126 U/L 97 - 184(H)  Total Bilirubin 0.3 - 1.2 mg/dL 0.5 0.4 1.0  Bilirubin, Direct 0.0 - 0.3 mg/dL - - -    Current Outpatient Medications  Medication Sig Dispense Refill  . albuterol (PROVENTIL HFA;VENTOLIN HFA) 108 (90 Base) MCG/ACT inhaler Inhale 2 puffs into the lungs every 4 (four) hours as needed for wheezing or shortness of breath. 1 Inhaler 1  . apixaban (ELIQUIS) 5 MG TABS tablet Take 2 tablets (10 mg total) by mouth 2 (two) times daily. Till 06/02/17 And then 5 mg bid from 06/03/17 60 tablet 5  . atenolol  (TENORMIN) 50 MG tablet TAKE 1 TABLET (50 MG TOTAL) BY MOUTH DAILY. 90 tablet 1  . benzonatate (TESSALON) 100 MG capsule Take 1 capsule (100 mg total) by mouth 3 (three) times daily as needed for cough. 30 capsule 0  . Cholecalciferol (VITAMIN D-1000 MAX ST) 1000 units tablet Take 5 tablets by mouth daily.    Marland Kitchen donepezil (ARICEPT) 5 MG tablet Take 1 tablet by mouth every evening.  6  . Ferrous Sulfate 5 MG/20ML LIQD     . gentamicin cream (GARAMYCIN) 0.1 % Apply 1 application topically 3 (three) times daily. 30 g 1  . levofloxacin (LEVAQUIN) 500 MG tablet Take 1 tablet (500 mg total) by mouth daily. 7 tablet 0  . loratadine (CLARITIN) 10 MG tablet Take 1 tablet (10 mg total) by mouth daily as needed for allergies.    . Omega-3 Fatty Acids (FISH OIL PO) Take 1 capsule by mouth 2 (two) times daily.    Marland Kitchen oxyCODONE-acetaminophen (PERCOCET/ROXICET) 5-325 MG tablet Take 1-2 tablets by mouth every 4 (four) hours as needed for moderate pain. 20 tablet 0  . senna-docusate (SENOKOT-S) 8.6-50 MG tablet Take 1 tablet by mouth at bedtime as needed for mild constipation. 30 tablet 0  . tiZANidine (ZANAFLEX) 2 MG tablet Take 2 mg by mouth every 6 (six) hours.  1  . vitamin B-12 (CYANOCOBALAMIN) 1000 MCG tablet Take 1 tablet by mouth daily.  No current facility-administered medications for this visit.     Allergies as of 07/17/2017 - Review Complete 07/08/2017  Allergen Reaction Noted  . Aspirin Anaphylaxis, Swelling, and Shortness Of Breath   . Bee pollen Other (See Comments) 01/16/2015  . Pollen extract  01/16/2015  . Rabbit epithelium  01/16/2015    ROS:  General: Negative for anorexia, weight loss, fever, chills, fatigue, weakness. ENT: Negative for hoarseness, difficulty swallowing , nasal congestion. CV: Negative for chest pain, angina, palpitations, dyspnea on exertion, peripheral edema.  Respiratory: Negative for dyspnea at rest, dyspnea on exertion, cough, sputum, wheezing.  GI: See  history of present illness. GU:  Negative for dysuria, hematuria, urinary incontinence, urinary frequency, nocturnal urination.  Endo: Negative for unusual weight change.    Physical Examination:   There were no vitals taken for this visit.  General: Well-nourished, well-developed in no acute distress.  Eyes: No icterus. Conjunctivae pink. Mouth: Oropharyngeal mucosa moist and pink , no lesions erythema or exudate. Lungs: Clear to auscultation bilaterally. Non-labored. Heart: Regular rate and rhythm, no murmurs rubs or gallops.  Abdomen: Bowel sounds are normal, nontender, nondistended, no hepatosplenomegaly or masses, no abdominal bruits or hernia , no rebound or guarding.   Extremities: No lower extremity edema. No clubbing or deformities. Neuro: Alert and oriented x 3.  Grossly intact. Skin: Warm and dry, no jaundice.   Psych: Alert and cooperative, normal mood and affect.   Imaging Studies: Dg Foot Complete Left  Result Date: 07/08/2017 Please see detailed radiograph report in office note.  US Liver Doppler  Result Date: 06/23/2017 CLINICAL DATA:  ABNORMAL LFTs EXAM: DUPLEX ULTRASOUND OF LIVER TECHNIQUE: Color and duplex Doppler ultrasound was performed to evaluate the hepatic in-flow and out-flow vessels. COMPARISON:  06/23/2017 FINDINGS: Portal Vein Velocities Main:  46 cm/sec Right:  19 cm/sec Left:  26 cm/sec Hepatic Vein Velocities Right:  46 cm/sec Middle:  36 cm/sec Left:  16 cm/sec Hepatic Artery Velocity:  139 cm/sec Splenic Vein Velocity:  18 cm/sec Varices: Not visualized Ascites: None. Portal vein diameter is 6 mm. Portal, hepatic and splenic veins are all patent with normal directional flow. Negative for portal vein occlusion or thrombus. IMPRESSION: Normal hepatic venous Doppler. Electronically Signed   By: Jerilynn Mages.  Shick M.D.   On: 06/23/2017 10:37   US Abdomen Limited Ruq  Result Date: 06/23/2017 CLINICAL DATA:  Elevated SGPT, SGOT, and alkaline phosphatase EXAM:  ULTRASOUND ABDOMEN LIMITED RIGHT UPPER QUADRANT COMPARISON:  CTA abdomen and pelvis 05/23/2017 FINDINGS: Gallbladder: Small dependent shadowing calculi in gallbladder. Probable small amount of associated gallbladder sludge. No gallbladder wall thickening, pericholecystic fluid, or sonographic Murphy sign. Common bile duct: Diameter: 3 mm diameter , normal Liver: Suboptimal visualization of LEFT lobe due to bowel gas. Hepatic echogenicity is probably mildly increased, with probably mildly increased echogenicity of the RIGHT kidney as well. Increased hepatic echogenicity can be seen with fatty infiltration, cirrhosis and certain infiltrative disorders. No discrete hepatic mass or nodularity. Portal vein is patent on color Doppler imaging with normal direction of blood flow towards the liver. No RIGHT upper quadrant free fluid. IMPRESSION: Cholelithiasis and probable small amount of sludge within gallbladder. Question mild fatty infiltration of liver as above. Electronically Signed   By: Lavonia Dana M.D.   On: 06/23/2017 10:35    Assessment and Plan:   IRMA DELANCEY is a 84 y.o. y/o male here to follow up for acute hepatitis. LFT's returned back to normal . My impression is that he had an  episode of ischemic hypotension probably perioperatively with possible episode of hypotension.   Plan  1. F/u with Dr Sanda Klein- recheck LFT's in a few months to ensure are normal  2. Can restart statin with close monitoring of LFT's 3. Per my prior note had suggested if warranted rheumatology evaluation for positive RF and CCP ab    Dr Jonathon Bellows  MD,MRCP [facility]) Follow up PRN

## 2017-07-18 ENCOUNTER — Telehealth: Payer: Self-pay | Admitting: Family Medicine

## 2017-07-18 NOTE — Telephone Encounter (Signed)
They stated to report you had to fill out form that they will be faxing

## 2017-07-18 NOTE — Telephone Encounter (Signed)
Patient's daughter is concerned that patient's memory is worse; he is getting lost while driving, thought the year was [id number] Encouraged [facility], need for medical evaluation by his neurologist

## 2017-07-18 NOTE — Telephone Encounter (Signed)
Thank you :)

## 2017-07-21 ENCOUNTER — Telehealth: Payer: Self-pay | Admitting: Family Medicine

## 2017-07-21 ENCOUNTER — Encounter: Payer: Self-pay | Admitting: Family Medicine

## 2017-07-21 DIAGNOSIS — N183 Chronic kidney disease, stage 3 (moderate): Secondary | ICD-10-CM | POA: Diagnosis not present

## 2017-07-21 DIAGNOSIS — M5136 Other intervertebral disc degeneration, lumbar region: Secondary | ICD-10-CM | POA: Diagnosis not present

## 2017-07-21 DIAGNOSIS — R4189 Other symptoms and signs involving cognitive functions and awareness: Secondary | ICD-10-CM | POA: Insufficient documentation

## 2017-07-21 DIAGNOSIS — I252 Old myocardial infarction: Secondary | ICD-10-CM | POA: Diagnosis not present

## 2017-07-21 DIAGNOSIS — I82432 Acute embolism and thrombosis of left popliteal vein: Secondary | ICD-10-CM | POA: Diagnosis not present

## 2017-07-21 DIAGNOSIS — F0390 Unspecified dementia without behavioral disturbance: Secondary | ICD-10-CM | POA: Diagnosis not present

## 2017-07-21 DIAGNOSIS — I129 Hypertensive chronic kidney disease with stage 1 through stage 4 chronic kidney disease, or unspecified chronic kidney disease: Secondary | ICD-10-CM | POA: Diagnosis not present

## 2017-07-21 DIAGNOSIS — I70262 Atherosclerosis of native arteries of extremities with gangrene, left leg: Secondary | ICD-10-CM | POA: Diagnosis not present

## 2017-07-21 DIAGNOSIS — R413 Other amnesia: Secondary | ICD-10-CM | POA: Insufficient documentation

## 2017-07-21 DIAGNOSIS — M1712 Unilateral primary osteoarthritis, left knee: Secondary | ICD-10-CM | POA: Diagnosis not present

## 2017-07-21 DIAGNOSIS — L89620 Pressure ulcer of left heel, unstageable: Secondary | ICD-10-CM | POA: Diagnosis not present

## 2017-07-21 NOTE — Telephone Encounter (Signed)
Verbal given if only by that date

## 2017-07-21 NOTE — Telephone Encounter (Signed)
Copied from CRM #[id number]. Topic: Quick Communication - See Telephone Encounter >> Jul 21, 2017  1:18 PM Waymon AmatoBurton, Donna F wrote: CRM for notification. See Telephone encounter for: [facility] called to get verbal orders for home health 2x 4 weeks   Best number [phone]  07/21/17.

## 2017-07-21 NOTE — Telephone Encounter (Signed)
That's fine as long as they're going by the original orders from 06/16/17; it's been more than 30 days since I have seen him, so diagnosis needs to match prior home health orders

## 2017-07-22 DIAGNOSIS — I82432 Acute embolism and thrombosis of left popliteal vein: Secondary | ICD-10-CM | POA: Diagnosis not present

## 2017-07-22 DIAGNOSIS — N183 Chronic kidney disease, stage 3 (moderate): Secondary | ICD-10-CM | POA: Diagnosis not present

## 2017-07-22 DIAGNOSIS — F0390 Unspecified dementia without behavioral disturbance: Secondary | ICD-10-CM | POA: Diagnosis not present

## 2017-07-22 DIAGNOSIS — I252 Old myocardial infarction: Secondary | ICD-10-CM | POA: Diagnosis not present

## 2017-07-22 DIAGNOSIS — M5136 Other intervertebral disc degeneration, lumbar region: Secondary | ICD-10-CM | POA: Diagnosis not present

## 2017-07-22 DIAGNOSIS — I129 Hypertensive chronic kidney disease with stage 1 through stage 4 chronic kidney disease, or unspecified chronic kidney disease: Secondary | ICD-10-CM | POA: Diagnosis not present

## 2017-07-22 DIAGNOSIS — L89620 Pressure ulcer of left heel, unstageable: Secondary | ICD-10-CM | POA: Diagnosis not present

## 2017-07-22 DIAGNOSIS — I70262 Atherosclerosis of native arteries of extremities with gangrene, left leg: Secondary | ICD-10-CM | POA: Diagnosis not present

## 2017-07-22 DIAGNOSIS — M1712 Unilateral primary osteoarthritis, left knee: Secondary | ICD-10-CM | POA: Diagnosis not present

## 2017-07-23 DIAGNOSIS — M1712 Unilateral primary osteoarthritis, left knee: Secondary | ICD-10-CM | POA: Diagnosis not present

## 2017-07-23 DIAGNOSIS — L89620 Pressure ulcer of left heel, unstageable: Secondary | ICD-10-CM | POA: Diagnosis not present

## 2017-07-23 DIAGNOSIS — F0390 Unspecified dementia without behavioral disturbance: Secondary | ICD-10-CM | POA: Diagnosis not present

## 2017-07-23 DIAGNOSIS — N183 Chronic kidney disease, stage 3 (moderate): Secondary | ICD-10-CM | POA: Diagnosis not present

## 2017-07-23 DIAGNOSIS — M5136 Other intervertebral disc degeneration, lumbar region: Secondary | ICD-10-CM | POA: Diagnosis not present

## 2017-07-23 DIAGNOSIS — I82432 Acute embolism and thrombosis of left popliteal vein: Secondary | ICD-10-CM | POA: Diagnosis not present

## 2017-07-23 DIAGNOSIS — I252 Old myocardial infarction: Secondary | ICD-10-CM | POA: Diagnosis not present

## 2017-07-23 DIAGNOSIS — I70262 Atherosclerosis of native arteries of extremities with gangrene, left leg: Secondary | ICD-10-CM | POA: Diagnosis not present

## 2017-07-23 DIAGNOSIS — I129 Hypertensive chronic kidney disease with stage 1 through stage 4 chronic kidney disease, or unspecified chronic kidney disease: Secondary | ICD-10-CM | POA: Diagnosis not present

## 2017-07-28 DIAGNOSIS — F0390 Unspecified dementia without behavioral disturbance: Secondary | ICD-10-CM | POA: Diagnosis not present

## 2017-07-28 DIAGNOSIS — M1712 Unilateral primary osteoarthritis, left knee: Secondary | ICD-10-CM | POA: Diagnosis not present

## 2017-07-28 DIAGNOSIS — I70262 Atherosclerosis of native arteries of extremities with gangrene, left leg: Secondary | ICD-10-CM | POA: Diagnosis not present

## 2017-07-28 DIAGNOSIS — I129 Hypertensive chronic kidney disease with stage 1 through stage 4 chronic kidney disease, or unspecified chronic kidney disease: Secondary | ICD-10-CM | POA: Diagnosis not present

## 2017-07-28 DIAGNOSIS — M5136 Other intervertebral disc degeneration, lumbar region: Secondary | ICD-10-CM | POA: Diagnosis not present

## 2017-07-28 DIAGNOSIS — L89620 Pressure ulcer of left heel, unstageable: Secondary | ICD-10-CM | POA: Diagnosis not present

## 2017-07-28 DIAGNOSIS — I82432 Acute embolism and thrombosis of left popliteal vein: Secondary | ICD-10-CM | POA: Diagnosis not present

## 2017-07-28 DIAGNOSIS — N183 Chronic kidney disease, stage 3 (moderate): Secondary | ICD-10-CM | POA: Diagnosis not present

## 2017-07-28 DIAGNOSIS — I252 Old myocardial infarction: Secondary | ICD-10-CM | POA: Diagnosis not present

## 2017-07-29 ENCOUNTER — Ambulatory Visit: Payer: Medicare HMO | Admitting: Podiatry

## 2017-07-29 DIAGNOSIS — I70262 Atherosclerosis of native arteries of extremities with gangrene, left leg: Secondary | ICD-10-CM | POA: Diagnosis not present

## 2017-07-29 DIAGNOSIS — N183 Chronic kidney disease, stage 3 (moderate): Secondary | ICD-10-CM | POA: Diagnosis not present

## 2017-07-29 DIAGNOSIS — F0390 Unspecified dementia without behavioral disturbance: Secondary | ICD-10-CM | POA: Diagnosis not present

## 2017-07-29 DIAGNOSIS — I129 Hypertensive chronic kidney disease with stage 1 through stage 4 chronic kidney disease, or unspecified chronic kidney disease: Secondary | ICD-10-CM | POA: Diagnosis not present

## 2017-07-29 DIAGNOSIS — L89620 Pressure ulcer of left heel, unstageable: Secondary | ICD-10-CM | POA: Diagnosis not present

## 2017-07-29 DIAGNOSIS — I82432 Acute embolism and thrombosis of left popliteal vein: Secondary | ICD-10-CM | POA: Diagnosis not present

## 2017-07-29 DIAGNOSIS — I252 Old myocardial infarction: Secondary | ICD-10-CM | POA: Diagnosis not present

## 2017-07-29 DIAGNOSIS — M1712 Unilateral primary osteoarthritis, left knee: Secondary | ICD-10-CM | POA: Diagnosis not present

## 2017-07-29 DIAGNOSIS — M5136 Other intervertebral disc degeneration, lumbar region: Secondary | ICD-10-CM | POA: Diagnosis not present

## 2017-08-05 DIAGNOSIS — F0390 Unspecified dementia without behavioral disturbance: Secondary | ICD-10-CM | POA: Diagnosis not present

## 2017-08-05 DIAGNOSIS — L89620 Pressure ulcer of left heel, unstageable: Secondary | ICD-10-CM | POA: Diagnosis not present

## 2017-08-05 DIAGNOSIS — M5136 Other intervertebral disc degeneration, lumbar region: Secondary | ICD-10-CM | POA: Diagnosis not present

## 2017-08-05 DIAGNOSIS — I70262 Atherosclerosis of native arteries of extremities with gangrene, left leg: Secondary | ICD-10-CM | POA: Diagnosis not present

## 2017-08-05 DIAGNOSIS — I129 Hypertensive chronic kidney disease with stage 1 through stage 4 chronic kidney disease, or unspecified chronic kidney disease: Secondary | ICD-10-CM | POA: Diagnosis not present

## 2017-08-05 DIAGNOSIS — M1712 Unilateral primary osteoarthritis, left knee: Secondary | ICD-10-CM | POA: Diagnosis not present

## 2017-08-05 DIAGNOSIS — N183 Chronic kidney disease, stage 3 (moderate): Secondary | ICD-10-CM | POA: Diagnosis not present

## 2017-08-05 DIAGNOSIS — I252 Old myocardial infarction: Secondary | ICD-10-CM | POA: Diagnosis not present

## 2017-08-05 DIAGNOSIS — I82432 Acute embolism and thrombosis of left popliteal vein: Secondary | ICD-10-CM | POA: Diagnosis not present

## 2017-08-06 DIAGNOSIS — E538 Deficiency of other specified B group vitamins: Secondary | ICD-10-CM | POA: Diagnosis not present

## 2017-08-06 DIAGNOSIS — G3184 Mild cognitive impairment, so stated: Secondary | ICD-10-CM | POA: Diagnosis not present

## 2017-08-06 DIAGNOSIS — F03A0 Unspecified dementia, mild, without behavioral disturbance, psychotic disturbance, mood disturbance, and anxiety: Secondary | ICD-10-CM | POA: Insufficient documentation

## 2017-08-06 DIAGNOSIS — F0390 Unspecified dementia without behavioral disturbance: Secondary | ICD-10-CM | POA: Diagnosis not present

## 2017-08-06 DIAGNOSIS — F39 Unspecified mood [affective] disorder: Secondary | ICD-10-CM | POA: Diagnosis not present

## 2017-08-07 DIAGNOSIS — I129 Hypertensive chronic kidney disease with stage 1 through stage 4 chronic kidney disease, or unspecified chronic kidney disease: Secondary | ICD-10-CM | POA: Diagnosis not present

## 2017-08-07 DIAGNOSIS — I82432 Acute embolism and thrombosis of left popliteal vein: Secondary | ICD-10-CM | POA: Diagnosis not present

## 2017-08-07 DIAGNOSIS — I252 Old myocardial infarction: Secondary | ICD-10-CM | POA: Diagnosis not present

## 2017-08-07 DIAGNOSIS — M1712 Unilateral primary osteoarthritis, left knee: Secondary | ICD-10-CM | POA: Diagnosis not present

## 2017-08-07 DIAGNOSIS — N183 Chronic kidney disease, stage 3 (moderate): Secondary | ICD-10-CM | POA: Diagnosis not present

## 2017-08-07 DIAGNOSIS — L89620 Pressure ulcer of left heel, unstageable: Secondary | ICD-10-CM | POA: Diagnosis not present

## 2017-08-07 DIAGNOSIS — M5136 Other intervertebral disc degeneration, lumbar region: Secondary | ICD-10-CM | POA: Diagnosis not present

## 2017-08-07 DIAGNOSIS — F0390 Unspecified dementia without behavioral disturbance: Secondary | ICD-10-CM | POA: Diagnosis not present

## 2017-08-07 DIAGNOSIS — I70262 Atherosclerosis of native arteries of extremities with gangrene, left leg: Secondary | ICD-10-CM | POA: Diagnosis not present

## 2017-08-08 ENCOUNTER — Encounter: Payer: Self-pay | Admitting: Podiatry

## 2017-08-08 ENCOUNTER — Ambulatory Visit: Payer: Medicare HMO | Admitting: Podiatry

## 2017-08-08 DIAGNOSIS — I252 Old myocardial infarction: Secondary | ICD-10-CM | POA: Diagnosis not present

## 2017-08-08 DIAGNOSIS — I83025 Varicose veins of left lower extremity with ulcer other part of foot: Secondary | ICD-10-CM

## 2017-08-08 DIAGNOSIS — M1712 Unilateral primary osteoarthritis, left knee: Secondary | ICD-10-CM | POA: Diagnosis not present

## 2017-08-08 DIAGNOSIS — I129 Hypertensive chronic kidney disease with stage 1 through stage 4 chronic kidney disease, or unspecified chronic kidney disease: Secondary | ICD-10-CM | POA: Diagnosis not present

## 2017-08-08 DIAGNOSIS — M5136 Other intervertebral disc degeneration, lumbar region: Secondary | ICD-10-CM | POA: Diagnosis not present

## 2017-08-08 DIAGNOSIS — F0390 Unspecified dementia without behavioral disturbance: Secondary | ICD-10-CM | POA: Diagnosis not present

## 2017-08-08 DIAGNOSIS — I82432 Acute embolism and thrombosis of left popliteal vein: Secondary | ICD-10-CM | POA: Diagnosis not present

## 2017-08-08 DIAGNOSIS — L97522 Non-pressure chronic ulcer of other part of left foot with fat layer exposed: Secondary | ICD-10-CM

## 2017-08-08 DIAGNOSIS — N183 Chronic kidney disease, stage 3 (moderate): Secondary | ICD-10-CM | POA: Diagnosis not present

## 2017-08-08 DIAGNOSIS — L89620 Pressure ulcer of left heel, unstageable: Secondary | ICD-10-CM | POA: Diagnosis not present

## 2017-08-08 DIAGNOSIS — I70262 Atherosclerosis of native arteries of extremities with gangrene, left leg: Secondary | ICD-10-CM | POA: Diagnosis not present

## 2017-08-08 DIAGNOSIS — L97529 Non-pressure chronic ulcer of other part of left foot with unspecified severity: Secondary | ICD-10-CM

## 2017-08-11 DIAGNOSIS — N183 Chronic kidney disease, stage 3 (moderate): Secondary | ICD-10-CM | POA: Diagnosis not present

## 2017-08-11 DIAGNOSIS — I82432 Acute embolism and thrombosis of left popliteal vein: Secondary | ICD-10-CM | POA: Diagnosis not present

## 2017-08-11 DIAGNOSIS — L89620 Pressure ulcer of left heel, unstageable: Secondary | ICD-10-CM | POA: Diagnosis not present

## 2017-08-11 DIAGNOSIS — M5136 Other intervertebral disc degeneration, lumbar region: Secondary | ICD-10-CM | POA: Diagnosis not present

## 2017-08-11 DIAGNOSIS — M1712 Unilateral primary osteoarthritis, left knee: Secondary | ICD-10-CM | POA: Diagnosis not present

## 2017-08-11 DIAGNOSIS — I70262 Atherosclerosis of native arteries of extremities with gangrene, left leg: Secondary | ICD-10-CM | POA: Diagnosis not present

## 2017-08-11 DIAGNOSIS — I129 Hypertensive chronic kidney disease with stage 1 through stage 4 chronic kidney disease, or unspecified chronic kidney disease: Secondary | ICD-10-CM | POA: Diagnosis not present

## 2017-08-11 DIAGNOSIS — F0390 Unspecified dementia without behavioral disturbance: Secondary | ICD-10-CM | POA: Diagnosis not present

## 2017-08-11 DIAGNOSIS — I252 Old myocardial infarction: Secondary | ICD-10-CM | POA: Diagnosis not present

## 2017-08-11 NOTE — Progress Notes (Signed)
   Subjective: 84 year old male presenting today for follow up evaluation of an ulceration of the left fifth toe. He states the area seems to have improved some. He reports continued erythema of the area but states it is mild. He has been applying gentamicin cream as directed. He also expresses concern about discoloration of the right lower leg that has been ongoing for the past 3-6 months. He has been evaluated by dermatology for this but is requesting another opinion. Patient is here for further evaluation and treatment.    Past Medical History:  Diagnosis Date  . Arthritis   . CKD (chronic kidney disease) stage 3, GFR 30-59 ml/min (HCC) 12/29/2015  . Dry gangrene (HCC) 06/16/2017   Great toe and 5th toe LEFT foot; Dr. Wyn Quakerew  . Hyperlipidemia   . Hypertension   . Left leg DVT (HCC) 01/2014  . Myocardial infarction (HCC)   . Pulmonary embolism (HCC) 01/2014  . Thoracic aorta atherosclerosis (HCC) 10/08/2016     Objective/Physical Exam General: The patient is alert and oriented x3 in no acute distress.  Dermatology:  Wound #1 noted to the left fifth toe measuring 0.5 x 0.5 x 0.2 cm (LxWxD).   To the noted ulceration(s), there is no eschar. There is a moderate amount of slough, fibrin, and necrotic tissue noted. Granulation tissue and wound base is red. There is a minimal amount of serosanguineous drainage noted. There is no exposed bone muscle-tendon ligament or joint. There is no malodor. Periwound integrity is intact. Skin is warm, dry and supple bilateral lower extremities.  Vascular: Palpable pedal pulses bilaterally. Mild edema noted. Capillary refill within normal limits. Varicosities noted bilateral lower extremities.   Neurological: Epicritic and protective threshold absent bilaterally.   Musculoskeletal Exam: Range of motion within normal limits to all pedal and ankle joints bilateral. Muscle strength 5/5 in all groups bilateral.   Assessment: #1 ulceration of the left fifth toe  secondary to venous insufficiency #2 PVD BLE  Plan of Care:  #1 Patient was evaluated.  #2 medically necessary excisional debridement including subcutaneous tissue was performed using a tissue nipper and a chisel blade. Excisional debridement of all the necrotic nonviable tissue down to healthy bleeding viable tissue was performed with post-debridement measurements same as pre-. #3 the wound was cleansed with normal saline and dressed with a dry sterile dressing. #4 Continue using gentamicin cream daily with a Band-Aid.  #5 Recommended wide fitting shoe gear.  #6 Return to clinic in 3 weeks.   Daughter's name is [name].  Felecia ShellingBrent M. Evans, DPM [facility]  Dr. Felecia ShellingBrent M. Evans, DPM    [address]                                        [place], [place] [address]                Office [phone]  Fax [phone]) [phone]

## 2017-08-12 NOTE — Progress Notes (Signed)
New Outpatient Visit Date: 08/13/2017  Referring Provider: Kerman Passey, MD [address] [place], [place] [address]  Chief Complaint: Shortness of breath and history of coronary artery disease  HPI:  [name] [name] is a 84 y.o. male who is being seen today for the evaluation of coronary artery disease at the request of Dr. Sherie Don. He has a history of coronary artery disease with chronic total occlusion of the LAD and inferior STEMI s/p PCI to the distal RCA, atrial fibrillation, hypertension, and hyperlipidemia.  [name] [name] was previously followed in our office by Dr. Shirlee Latch, having last been seen in 2011.  He presents today to reestablish care.  Recently, he has been plagued by lower extremity wounds and peripheral vascular disease managed by the foot center and vascular surgery.  Today, [name] [name] is most concerned about poor circulation in his toes.  He continues to struggle with poor wound healing and gangrene on the left foot.  He reports doing well from a heart standpoint, denying chest pain and shortness of breath.  However, his daughter, who accompanies [name] [name] today, points out that her father frequently appears short of breath with mild activity.  He notes chronic swelling of the left calf.  He denies orthopnea and PND.  He currently takes apixaban prescribed by Dr. Wyn Quaker following implantation of covered stent.  He fell once in November while trying to adjust the curtains and is walking with a rolling walker.  --------------------------------------------------------------------------------------------------  Cardiovascular History & Procedures: Cardiovascular Problems:  Coronary artery disease with chronic total occlusion of the LAD  Peripheral vascular disease  Risk Factors:  Coronary and peripheral vascular disease, hypertension, hyperlipidemia, male gender, and age greater than 68  Cath/PCI:  LHC (09/24/02): LMCA with 20% distal stenosis.  LAD is a large vessel  giving off a single diagonal branch after which the LAD is chronically occluded.  Diagonal has 50% sequential lesions.  LCx is a large vessel with 30% proximal stenosis.  OM1 has a 40% stenosis.  RCA is a large vessel with a widely patent stent in its distal segment.  There is a 40% lesion proximal to the stent.  LVEF 60% with mild inferior hypokinesis.  CV Surgery:  None  EP Procedures and Devices:  None  Non-Invasive Evaluation(s):  None available  Recent CV Pertinent Labs: Lab Results  Component Value Date   CHOL 164 03/24/2017   CHOL 179 03/13/2015   HDL 39 (L) 03/24/2017   HDL 26 (L) 03/13/2015   LDLCALC 58 10/08/2016   LDLCALC 91 03/13/2015   LDLDIRECT 85.4 02/08/2009   TRIG 150 (H) 03/24/2017   CHOLHDL 4.2 03/24/2017   INR 1.31 06/20/2017   INR 2.6 02/09/2014   BNP 9,539 (H) 02/05/2014   K 4.0 07/09/2017   K 4.3 02/07/2014   BUN 20 07/09/2017   BUN 27 03/13/2015   BUN 20 (H) 02/06/2014   CREATININE 1.27 (H) 07/09/2017   CREATININE 1.16 (H) 06/16/2017    --------------------------------------------------------------------------------------------------  Past Medical History:  Diagnosis Date  . Arthritis   . CKD (chronic kidney disease) stage 3, GFR 30-59 ml/min (HCC) 12/29/2015  . Dry gangrene (HCC) 06/16/2017   Great toe and 5th toe LEFT foot; Dr. Wyn Quaker  . Hyperlipidemia   . Hypertension   . Left leg DVT (HCC) 01/2014  . Myocardial infarction (HCC)   . Pulmonary embolism (HCC) 01/2014  . Thoracic aorta atherosclerosis (HCC) 10/08/2016    Past Surgical History:  Procedure Laterality Date  . CORONARY  STENT PLACEMENT  2006  . JOINT REPLACEMENT  2011 & 2012   both hips  . len replaced Left 2015  . LOWER EXTREMITY ANGIOGRAPHY N/A 05/26/2017   Procedure: Lower Extremity Angiography;  Surgeon: Annice Needyew, Jason S, MD;  Location: [facility] INVASIVE CV LAB;  Service: Cardiovascular;  Laterality: N/A;  . MELANOMA EXCISION     40 years ago    Current Meds  Medication Sig    . albuterol (PROVENTIL HFA;VENTOLIN HFA) 108 (90 Base) MCG/ACT inhaler Inhale 2 puffs into the lungs every 4 (four) hours as needed for wheezing or shortness of breath.  Marland Kitchen. apixaban (ELIQUIS) 5 MG TABS tablet Take 2 tablets (10 mg total) by mouth 2 (two) times daily. Till 06/02/17 And then 5 mg bid from 06/03/17  . atenolol (TENORMIN) 50 MG tablet TAKE 1 TABLET (50 MG TOTAL) BY MOUTH DAILY.  . benzonatate (TESSALON) 100 MG capsule Take 1 capsule (100 mg total) by mouth 3 (three) times daily as needed for cough.  . Cholecalciferol (VITAMIN D-1000 MAX ST) 1000 units tablet Take 5 tablets by mouth daily.  Marland Kitchen. donepezil (ARICEPT) 5 MG tablet Take 1 tablet by mouth every evening.  Marland Kitchen. gentamicin cream (GARAMYCIN) 0.1 % Apply 1 application topically 3 (three) times daily.  Marland Kitchen. loratadine (CLARITIN) 10 MG tablet Take 1 tablet (10 mg total) by mouth daily as needed for allergies.  . Omega-3 Fatty Acids (FISH OIL PO) Take 1 capsule by mouth 2 (two) times daily.  . vitamin B-12 (CYANOCOBALAMIN) 1000 MCG tablet Take 1 tablet by mouth daily.    Allergies: Aspirin; Bee pollen; Pollen extract; and Rabbit epithelium  Social History   Socioeconomic History  . Marital status: Married    Spouse name: Not on file  . Number of children: Not on file  . Years of education: Not on file  . Highest education level: Not on file  Social Needs  . Financial resource strain: Not on file  . Food insecurity - worry: Not on file  . Food insecurity - inability: Not on file  . Transportation needs - medical: Not on file  . Transportation needs - non-medical: Not on file  Occupational History    Employer: RETIRED  Tobacco Use  . Smoking status: Former Smoker    Types: Pipe  . Smokeless tobacco: Never Used  Substance and Sexual Activity  . Alcohol use: No    Alcohol/week: 0.0 oz  . Drug use: No  . Sexual activity: No  Other Topics Concern  . Not on file  Social History Narrative  . Not on file    Family History   Problem Relation Age of Onset  . Arthritis Mother   . Arthritis Father   . Depression Father     Review of Systems: A 12-system review of systems was performed and was negative except as noted in the HPI.  --------------------------------------------------------------------------------------------------  Physical Exam: BP 104/64 (BP Location: Left Arm, Patient Position: Sitting, Cuff Size: Normal)   Pulse 64   Ht 5\' 2"  (1.575 m)   Wt 149 lb (67.6 kg)   BMI 27.25 kg/m   General: Overweight, elderly man, seated comfortably in the exam room.  He is accompanied by his daughter. HEENT: No conjunctival pallor or scleral icterus. Moist mucous membranes. OP clear. Neck: Supple without lymphadenopathy, thyromegaly, JVD, or HJR. No carotid bruit. Lungs: Normal work of breathing. Clear to auscultation bilaterally without wheezes or crackles. Heart: Regular rate and rhythm without murmurs, rubs, or gallops. Non-displaced PMI. Abd: Bowel  sounds present. Soft, NT/ND without hepatosplenomegaly Ext: Trace to 1+ calf edema.  No right lower extremity edema.  Trace pedal pulses. Skin: Warm and dry. Neuro: CNIII-XII intact. Strength and fine-touch sensation intact in upper and lower extremities bilaterally. Psych: Normal mood and affect.  EKG: Baseline artifact.  No significant abnormalities.  Lab Results  Component Value Date   WBC 12.5 (H) 06/14/2017   HGB 12.8 (L) 06/14/2017   HCT 38.9 (L) 06/14/2017   MCV 91.1 06/14/2017   PLT 336 06/14/2017    Lab Results  Component Value Date   NA 136 07/09/2017   K 4.0 07/09/2017   CL 101 07/09/2017   CO2 26 07/09/2017   BUN 20 07/09/2017   CREATININE 1.27 (H) 07/09/2017   GLUCOSE 105 (H) 07/09/2017   ALT 21 07/09/2017    Lab Results  Component Value Date   CHOL 164 03/24/2017   HDL 39 (L) 03/24/2017   LDLCALC 58 10/08/2016   LDLDIRECT 85.4 02/08/2009   TRIG 150 (H) 03/24/2017   CHOLHDL 4.2 03/24/2017    --------------------------------------------------------------------------------------------------  ASSESSMENT AND PLAN: Coronary artery disease and shortness of breath [name] [name] reports no symptoms, though his daughter points out that her father is frequently short of breath with mild activity.  EKG today is normal.  There is only mild left leg edema on exam, which is chronic.  We have agreed to begin with a transthoracic echocardiogram.  If there are no significant abnormalities, we will defer ischemia evaluation for now given the patient's age and limited symptoms.  Peripheral vascular disease Poorly healing left wounds.  Continue apixaban and ongoing management per Dr. Wyn Quaker.  Hypertension Blood pressure low normal today.  No medication changes at this time.  Hyperlipidemia Most recent LDL in 09/2016 was quite good at 58.  However, it does not appear that [name] [name] is on a statin at this time, which is standard of care given history of CAD and PAD.  I recommend that at least low to moderate-intensity statin be considered.  I will defer this to Drs. Lada and Dew.  Follow-up: Return to clinic in 6 months, sooner if echocardiogram shows significant abnormalities.  Yvonne Kendall, MD 08/13/2017 2:23 PM

## 2017-08-13 ENCOUNTER — Encounter: Payer: Self-pay | Admitting: Internal Medicine

## 2017-08-13 ENCOUNTER — Ambulatory Visit: Payer: Medicare HMO | Admitting: Internal Medicine

## 2017-08-13 VITALS — BP 104/64 | HR 64 | Ht 62.0 in | Wt 149.0 lb

## 2017-08-13 DIAGNOSIS — I2510 Atherosclerotic heart disease of native coronary artery without angina pectoris: Secondary | ICD-10-CM | POA: Diagnosis not present

## 2017-08-13 DIAGNOSIS — E782 Mixed hyperlipidemia: Secondary | ICD-10-CM

## 2017-08-13 DIAGNOSIS — I739 Peripheral vascular disease, unspecified: Secondary | ICD-10-CM

## 2017-08-13 DIAGNOSIS — I10 Essential (primary) hypertension: Secondary | ICD-10-CM | POA: Diagnosis not present

## 2017-08-13 DIAGNOSIS — R0602 Shortness of breath: Secondary | ICD-10-CM | POA: Diagnosis not present

## 2017-08-13 NOTE — Patient Instructions (Signed)
Medication Instructions:  Your physician recommends that you continue on your current medications as directed. Please refer to the Current Medication list given to you today.   Labwork: none  Testing/Procedures: Your physician has requested that you have an echocardiogram. Echocardiography is a painless test that uses sound waves to create images of your heart. It provides your doctor with information about the size and shape of your heart and how well your heart's chambers and valves are working. This procedure takes approximately one hour. There are no restrictions for this procedure.    Follow-Up: Your physician wants you to follow-up in: 6 MONTHS WITH DR END. You will receive a reminder letter in the mail two months in advance. If you don't receive a letter, please call our office to schedule the follow-up appointment.  If you need a refill on your cardiac medications before your next appointment, please call your pharmacy.    Echocardiogram An echocardiogram, or echocardiography, uses sound waves (ultrasound) to produce an image of your heart. The echocardiogram is simple, painless, obtained within a short period of time, and offers valuable information to your health care provider. The images from an echocardiogram can provide information such as:  Evidence of coronary artery disease (CAD).  Heart size.  Heart muscle function.  Heart valve function.  Aneurysm detection.  Evidence of a past heart attack.  Fluid buildup around the heart.  Heart muscle thickening.  Assess heart valve function.  Tell a health care provider about:  Any allergies you have.  All medicines you are taking, including vitamins, herbs, eye drops, creams, and over-the-counter medicines.  Any problems you or family members have had with anesthetic medicines.  Any blood disorders you have.  Any surgeries you have had.  Any medical conditions you have.  Whether you are pregnant or may be  pregnant. What happens before the procedure? No special preparation is needed. Eat and drink normally. What happens during the procedure?  In order to produce an image of your heart, gel will be applied to your chest and a wand-like tool (transducer) will be moved over your chest. The gel will help transmit the sound waves from the transducer. The sound waves will harmlessly bounce off your heart to allow the heart images to be captured in real-time motion. These images will then be recorded.  You may need an IV to receive a medicine that improves the quality of the pictures. What happens after the procedure? You may return to your normal schedule including diet, activities, and medicines, unless your health care provider tells you otherwise. This information is not intended to replace advice given to you by your health care provider. Make sure you discuss any questions you have with your health care provider. Document Released: 06/14/2000 Document Revised: 02/03/2016 Document Reviewed: 02/22/2013 Elsevier Interactive Patient Education  2017 Elsevier Inc.  

## 2017-08-14 ENCOUNTER — Encounter: Payer: Self-pay | Admitting: Internal Medicine

## 2017-08-14 DIAGNOSIS — M19071 Primary osteoarthritis, right ankle and foot: Secondary | ICD-10-CM | POA: Diagnosis not present

## 2017-08-14 DIAGNOSIS — I739 Peripheral vascular disease, unspecified: Secondary | ICD-10-CM | POA: Insufficient documentation

## 2017-08-14 DIAGNOSIS — M19041 Primary osteoarthritis, right hand: Secondary | ICD-10-CM | POA: Diagnosis not present

## 2017-08-14 DIAGNOSIS — M25562 Pain in left knee: Secondary | ICD-10-CM | POA: Diagnosis not present

## 2017-08-14 DIAGNOSIS — R768 Other specified abnormal immunological findings in serum: Secondary | ICD-10-CM | POA: Diagnosis not present

## 2017-08-14 DIAGNOSIS — M19042 Primary osteoarthritis, left hand: Secondary | ICD-10-CM | POA: Diagnosis not present

## 2017-08-14 DIAGNOSIS — M179 Osteoarthritis of knee, unspecified: Secondary | ICD-10-CM | POA: Diagnosis not present

## 2017-08-15 ENCOUNTER — Telehealth: Payer: Self-pay | Admitting: Family Medicine

## 2017-08-15 MED ORDER — ATORVASTATIN CALCIUM 10 MG PO TABS: 10.0000 mg | ORAL_TABLET | Freq: Every day | ORAL | 1 refills | Status: DC

## 2017-08-15 NOTE — Telephone Encounter (Signed)
Please contact patient I read the note from his cardiologist, and the cardiologist would like to put patient on a statin, even though his LDL is excellent, it may benefit his coronary artery disease and peripheral arterial disease I'll send in the Rx; if he develops muscle aches or any problems, cut it back to just two days a week and see how that goes Call with any issues Thank you

## 2017-08-15 NOTE — Telephone Encounter (Signed)
Left detailed voicemail

## 2017-08-28 ENCOUNTER — Other Ambulatory Visit (INDEPENDENT_AMBULATORY_CARE_PROVIDER_SITE_OTHER): Payer: Self-pay | Admitting: Vascular Surgery

## 2017-08-28 DIAGNOSIS — I739 Peripheral vascular disease, unspecified: Secondary | ICD-10-CM

## 2017-08-29 ENCOUNTER — Ambulatory Visit: Payer: Medicare HMO | Admitting: Podiatry

## 2017-08-29 DIAGNOSIS — L97529 Non-pressure chronic ulcer of other part of left foot with unspecified severity: Secondary | ICD-10-CM | POA: Diagnosis not present

## 2017-08-29 DIAGNOSIS — I83025 Varicose veins of left lower extremity with ulcer other part of foot: Secondary | ICD-10-CM

## 2017-08-29 DIAGNOSIS — L97522 Non-pressure chronic ulcer of other part of left foot with fat layer exposed: Secondary | ICD-10-CM

## 2017-09-01 NOTE — Progress Notes (Signed)
   Subjective: 84 year old male presenting today for follow up evaluation of an ulceration of the left fifth toe. He states the wound has improved significantly. He has been applying gentamicin cream as directed. He denies any new complaints at this time. Patient is here for further evaluation and treatment.    Past Medical History:  Diagnosis Date  . Arthritis   . CKD (chronic kidney disease) stage 3, GFR 30-59 ml/min (HCC) 12/29/2015  . Dry gangrene (HCC) 06/16/2017   Great toe and 5th toe LEFT foot; Dr. Wyn Quakerew  . Hyperlipidemia   . Hypertension   . Left leg DVT (HCC) 01/2014  . Myocardial infarction (HCC)   . Pulmonary embolism (HCC) 01/2014  . Thoracic aorta atherosclerosis (HCC) 10/08/2016     Objective/Physical Exam General: The patient is alert and oriented x3 in no acute distress.  Dermatology:  Wound noted to the left fifth toe has healed. Complete re-epithelialization has occurred. No drainage noted. Skin is warm, dry and supple bilateral lower extremities. Negative for open lesions or macerations.  Vascular: Palpable pedal pulses bilaterally. Mild edema noted. Capillary refill within normal limits. Varicosities noted bilateral lower extremities.   Neurological: Epicritic and protective threshold absent bilaterally.   Musculoskeletal Exam: Range of motion within normal limits to all pedal and ankle joints bilateral. Muscle strength 5/5 in all groups bilateral.   Assessment: #1 ulceration of the left fifth toe secondary to venous insufficiency - resolved #2 PVD BLE  Plan of Care:  #1 Patient was evaluated.  #2 recommended wide fitting shoes. #3 Silicone toe caps dispensed.  #4 Return to clinic as needed.   Daughter's name is [name].  Felecia ShellingBrent M. Lamarco Gudiel, DPM [facility]  Dr. Felecia ShellingBrent M. Lichelle Viets, DPM    [address]                                        [place], [place] [address]                Office [phone]  Fax [phone]) [phone]

## 2017-09-02 ENCOUNTER — Ambulatory Visit (INDEPENDENT_AMBULATORY_CARE_PROVIDER_SITE_OTHER): Payer: Medicare HMO | Admitting: Vascular Surgery

## 2017-09-02 ENCOUNTER — Encounter (INDEPENDENT_AMBULATORY_CARE_PROVIDER_SITE_OTHER): Payer: Self-pay | Admitting: Vascular Surgery

## 2017-09-02 ENCOUNTER — Ambulatory Visit (INDEPENDENT_AMBULATORY_CARE_PROVIDER_SITE_OTHER): Payer: Medicare HMO

## 2017-09-02 VITALS — BP 165/65 | HR 60 | Resp 16 | Wt 151.6 lb

## 2017-09-02 DIAGNOSIS — I10 Essential (primary) hypertension: Secondary | ICD-10-CM

## 2017-09-02 DIAGNOSIS — N183 Chronic kidney disease, stage 3 unspecified: Secondary | ICD-10-CM

## 2017-09-02 DIAGNOSIS — I739 Peripheral vascular disease, unspecified: Secondary | ICD-10-CM | POA: Diagnosis not present

## 2017-09-02 DIAGNOSIS — I1310 Hypertensive heart and chronic kidney disease without heart failure, with stage 1 through stage 4 chronic kidney disease, or unspecified chronic kidney disease: Secondary | ICD-10-CM

## 2017-09-02 NOTE — Assessment & Plan Note (Signed)
His noninvasive studies today demonstrate noncompressible ABIs but good triphasic waveforms bilaterally with normal digital pressures of 105 on the right and 100 on the left with good pulsatile deflections. Clinically he is doing well with no worrisome, limb threatening symptoms.  We will stretch out his follow-up to 6 months at this point.  Continue current medical regimen.

## 2017-09-02 NOTE — Patient Instructions (Signed)

## 2017-09-02 NOTE — Progress Notes (Signed)
MRN : [id number]  [name] [name] is a 84 y.o. (Sep 17, 1927) male who presents with chief complaint of  Chief Complaint  Patient presents with  . Follow-up    2-80monthabi  .  History of Present Illness: Patient returns today in follow up of PAD.  He is doing well today.  His previous left foot ulcerations have all completely healed at this point.  He does not have any significant rest pain, signs of infection, or new ulcerations.  His noninvasive studies today demonstrate noncompressible ABIs but good triphasic waveforms bilaterally with normal digital pressures of 105 on the right and 100 on the left with good pulsatile deflections.  Current Outpatient Medications  Medication Sig Dispense Refill  . albuterol (PROVENTIL HFA;VENTOLIN HFA) 108 (90 Base) MCG/ACT inhaler Inhale 2 puffs into the lungs every 4 (four) hours as needed for wheezing or shortness of breath. 1 Inhaler 1  . apixaban (ELIQUIS) 5 MG TABS tablet Take 2 tablets (10 mg total) by mouth 2 (two) times daily. Till 06/02/17 And then 5 mg bid from 06/03/17 60 tablet 5  . atenolol (TENORMIN) 50 MG tablet TAKE 1 TABLET (50 MG TOTAL) BY MOUTH DAILY. 90 tablet 1  . atorvastatin (LIPITOR) 10 MG tablet Take 1 tablet (10 mg total) by mouth at bedtime. 30 tablet 1  . benzonatate (TESSALON) 100 MG capsule Take 1 capsule (100 mg total) by mouth 3 (three) times daily as needed for cough. 30 capsule 0  . Cholecalciferol (VITAMIN D-1000 MAX ST) 1000 units tablet Take 5 tablets by mouth daily.    .Marland Kitchendonepezil (ARICEPT) 5 MG tablet Take 1 tablet by mouth every evening.  6  . gentamicin cream (GARAMYCIN) 0.1 % Apply 1 application topically 3 (three) times daily. 30 g 1  . loratadine (CLARITIN) 10 MG tablet Take 1 tablet (10 mg total) by mouth daily as needed for allergies.    . Omega-3 Fatty Acids (FISH OIL PO) Take 1 capsule by mouth 2 (two) times daily.    . vitamin B-12 (CYANOCOBALAMIN) 1000 MCG tablet Take 1 tablet by mouth daily.     No  current facility-administered medications for this visit.     Past Medical History:  Diagnosis Date  . Arthritis   . CKD (chronic kidney disease) stage 3, GFR 30-59 ml/min (HCC) 12/29/2015  . [place] ([facility] 06/16/2017   Great toe and 5th toe LEFT foot; Dr. DLucky Cowboy . Hyperlipidemia   . Hypertension   . [place] leg DVT ([facility] 01/2014  . Myocardial infarction ([facility]   . [place]ism ([facility] 01/2014  . [place] atherosclerosis ([facility] 10/08/2016    Past Surgical History:  Procedure Laterality Date  . CORONARY STENT PLACEMENT  2006  . JOINT REPLACEMENT  2011 & 2012   both hips  . len replaced Left 2015  . LOWER EXTREMITY ANGIOGRAPHY N/A 05/26/2017   Procedure: Lower Extremity Angiography;  Surgeon: DAlgernon Huxley MD;  [facility];  Service: Cardiovascular;  Laterality: N/A;  . MELANOMA EXCISION     40 years ago    Social History Social History        Tobacco Use  . Smoking status: Former Smoker    Types: Pipe  . Smokeless tobacco: Never Used  Substance Use Topics  . Alcohol use: No    Alcohol/week: 0.0 oz  . Drug use: No      Family History      Family History  Problem Relation Age of  Onset  . Arthritis Mother   . Arthritis Father   . Depression Father           Allergies  Allergen Reactions  . Aspirin Anaphylaxis, Swelling and Shortness Of Breath    REACTION: terrible bp reaction/wheezing  . Bee Pollen Other (See Comments)    ragweed  . Pollen Extract     ragweed  . Rabbit Epithelium     rabbits     REVIEW OF SYSTEMS (Negative unless checked)  Constitutional: '[]'$ Weight loss  '[]'$ Fever  '[]'$ Chills Cardiac: '[]'$ Chest pain   '[]'$ Chest pressure   '[]'$ Palpitations   '[]'$ Shortness of breath when laying flat   '[]'$ Shortness of breath at rest   '[]'$ Shortness of breath with exertion. Vascular:  '[]'$ Pain in legs with walking   '[]'$ Pain in legs at rest   '[]'$ Pain in legs when laying flat   '[]'$ Claudication   '[x]'$ Pain in feet when walking   '[x]'$ Pain in feet at rest  '[]'$ Pain in feet when laying flat   '[]'$ History of DVT   '[]'$ Phlebitis   '[]'$ Swelling in legs   '[]'$ Varicose veins   '[x]'$ Non-healing ulcers Pulmonary:   '[]'$ Uses home oxygen   '[]'$ Productive cough   '[]'$ Hemoptysis   '[]'$ Wheeze  '[]'$ COPD   '[]'$ Asthma Neurologic:  '[]'$ Dizziness  '[]'$ Blackouts   '[]'$ Seizures   '[]'$ History of stroke   '[]'$ History of TIA  '[]'$ Aphasia   '[]'$ Temporary blindness   '[]'$ Dysphagia   '[]'$ Weakness or numbness in arms   '[x]'$ Weakness or numbness in legs Musculoskeletal:  '[]'$ Arthritis   '[]'$ Joint swelling   '[]'$ Joint pain   '[]'$ Low back pain Hematologic:  '[]'$ Easy bruising  '[]'$ Easy bleeding   '[]'$ Hypercoagulable state   '[]'$ Anemic   Gastrointestinal:  '[]'$ Blood in stool   '[]'$ Vomiting blood  '[]'$ Gastroesophageal reflux/heartburn   '[]'$ Abdominal pain Genitourinary:  '[]'$ Chronic kidney disease   '[]'$ Difficult urination  '[]'$ Frequent urination  '[]'$ Burning with urination   '[]'$ Hematuria Skin:  '[]'$ Rashes   '[x]'$ Ulcers   '[x]'$ Wounds Psychological:  '[]'$ History of anxiety   '[]'$  History of major depression.      Physical Examination  BP (!) 165/65 (BP Location: Right Arm)   Pulse 60   Resp 16   Wt 68.8 kg (151 lb 9.6 oz)   BMI 27.73 kg/m  Gen:  WD/WN, NAD. Appears younger than stated age. Head: [place]/AT, No temporalis wasting. Ear/Nose/Throat: Hearing grossly intact, nares w/o erythema or drainage, trachea midline Eyes: Conjunctiva clear. Sclera non-icteric Neck: Supple.  No JVD.  Pulmonary:  Good air movement, no use of accessory muscles.  Cardiac: RRR, normal S1, S2 Vascular:  Vessel Right Left  Radial Palpable Palpable                          PT Palpable Palpable  DP Palpable 1+ Palpable     Musculoskeletal: M/S 5/5 throughout.  No deformity or atrophy. Walks with a cane. No edema. Neurologic: Sensation grossly intact in extremities.  Symmetrical.  Speech is fluent.  Psychiatric: Judgment intact, Mood & affect appropriate for pt's clinical situation. Dermatologic: No rashes or ulcers noted.  No cellulitis or open  wounds.      Labs Recent Results (from the past 2160 hour(s))  CBC with Differential/Platelet     Status: Abnormal   Collection Time: 06/14/17  1:10 PM  Result Value Ref Range   WBC 12.5 (H) 3.8 - 10.6 K/uL   RBC 4.27 (L) 4.40 - 5.90 MIL/uL   Hemoglobin 12.8 (L) 13.0 - 18.0 g/dL   HCT 38.9 (L) 40.0 - 52.0 %  MCV 91.1 80.0 - 100.0 fL   MCH 30.1 26.0 - 34.0 pg   MCHC 33.0 32.0 - 36.0 g/dL   RDW 14.1 11.5 - 14.5 %   Platelets 336 150 - 440 K/uL   Neutrophils Relative % 93 %   Neutro Abs 11.7 (H) 1.4 - 6.5 K/uL   Lymphocytes Relative 4 %   Lymphs Abs 0.5 (L) 1.0 - 3.6 K/uL   Monocytes Relative 3 %   Monocytes Absolute 0.3 0.2 - 1.0 K/uL   Eosinophils Relative 0 %   Eosinophils Absolute 0.0 0 - 0.7 K/uL   Basophils Relative 0 %   Basophils Absolute 0.0 0 - 0.1 K/uL  Comprehensive metabolic panel     Status: Abnormal   Collection Time: 06/14/17  1:10 PM  Result Value Ref Range   Sodium 132 (L) 135 - 145 mmol/L   Potassium 4.3 3.5 - 5.1 mmol/L   Chloride 96 (L) 101 - 111 mmol/L   CO2 24 22 - 32 mmol/L   Glucose, Bld 159 (H) 65 - 99 mg/dL   BUN 22 (H) 6 - 20 mg/dL   Creatinine, Ser 1.18 0.61 - 1.24 mg/dL   Calcium 9.2 8.9 - 10.3 mg/dL   Total Protein 8.0 6.5 - 8.1 g/dL   Albumin 2.6 (L) 3.5 - 5.0 g/dL   AST 820 (H) 15 - 41 U/L   ALT 358 (H) 17 - 63 U/L   Alkaline Phosphatase 184 (H) 38 - 126 U/L   Total Bilirubin 1.0 0.3 - 1.2 mg/dL   GFR calc non Af Amer 54 (L) >60 mL/min   GFR calc Af Amer >60 >60 mL/min    Comment: (NOTE) The eGFR has been calculated using the CKD EPI equation. This calculation has not been validated in all clinical situations. eGFR's persistently <60 mL/min signify possible Chronic Kidney Disease.    Anion gap 12 5 - 15  COMPLETE METABOLIC PANEL WITH GFR     Status: Abnormal   Collection Time: 06/16/17 11:54 AM  Result Value Ref Range   Glucose, Bld 127 (H) 65 - 99 mg/dL    Comment: .            Fasting reference interval . For someone  without known diabetes, a glucose value >125 mg/dL indicates that they may have diabetes and this should be confirmed with a follow-up test. .    BUN 30 (H) 7 - 25 mg/dL   Creat 1.16 (H) 0.70 - 1.11 mg/dL    Comment: For patients >32 years of age, the reference limit for Creatinine is approximately 13% higher for people identified as African-American. .    GFR, Est Non African American 56 (L) > OR = 60 mL/min/1.55m   GFR, Est African American 65 > OR = 60 mL/min/1.718m  BUN/Creatinine Ratio 26 (H) 6 - 22 (calc)   Sodium 140 135 - 146 mmol/L   Potassium 4.2 3.5 - 5.3 mmol/L   Chloride 101 98 - 110 mmol/L   CO2 27 20 - 32 mmol/L   Calcium 9.5 8.6 - 10.3 mg/dL   Total Protein 7.0 6.1 - 8.1 g/dL   Albumin 3.1 (L) 3.6 - 5.1 g/dL   Globulin 3.9 (H) 1.9 - 3.7 g/dL (calc)   AG Ratio 0.8 (L) 1.0 - 2.5 (calc)   Total Bilirubin 0.4 0.2 - 1.2 mg/dL   Alkaline phosphatase (APISO) 177 (H) 40 - 115 U/L   AST 566 (H) 10 - 35 U/L   ALT 565 (  H) 9 - 46 U/L    Comment: Verified by repeat analysis. .   Hepatitis panel, acute     Status: None   Collection Time: 06/16/17 11:54 AM  Result Value Ref Range   Hep A IgM NON-REACTIVE NON-REACTI   Hepatitis B Surface Ag NON-REACTIVE NON-REACTI   Hep B C IgM NON-REACTIVE NON-REACTI   Hepatitis C Ab NON-REACTIVE NON-REACTI   SIGNAL TO CUT-OFF 0.13 <1.00  ANA,IFA RA Diag Pnl w/rflx Tit/Patn     Status: Abnormal   Collection Time: 06/16/17 11:54 AM  Result Value Ref Range   Anit Nuclear Antibody(ANA) NEGATIVE NEGATIVE    Comment: . Although the specimen was negative for anti- nuclear antibodies (ANA), the presence of cytoplasmic fluorescence was noted on the HEp-2 slide.  Other reactivities (e.g., anti- mitochondrial antibodies or anti-smooth muscle antibodies) may be responsible for this fluorescence. ANA IFA is a first line screen for detecting the presence of up to approximately 150 autoantibodies in various autoimmune diseases. A negative  ANA IFA result suggests ANA-associated autoimmune diseases are not present at this time. . Visit Physician FAQs for interpretation of all antibodies in the [place], prevalence, and association with diseases at http://education.[employer].com/ OAC/ZYS063 .    Rhuematoid fact SerPl-aCnc 15 (H) <01 IU/mL   Cyclic Citrullin Peptide Ab >250 (H) UNITS    Comment: Reference Range Negative:            <20 Weak Positive:       20-39 Moderate Positive:   40-59 Strong Positive:     >59 .    INTERPRETATION      Comment: . There is serologic evidence for rheumatoid arthritis. Taken together, the RF and CCP assays have a specificity for established rheumatoid arthritis of  approximately 90-95%. .   SMOOTH MUSC W/REFL     Status: None   Collection Time: 06/16/17 11:54 AM  Result Value Ref Range   SMOOTH MUSCLE AB SCREEN NEGATIVE NEGATIVE  Urinalysis, Routine w reflex microscopic     Status: Abnormal   Collection Time: 06/20/17 12:30 PM  Result Value Ref Range   Color, Urine YELLOW (A) YELLOW   APPearance CLEAR (A) CLEAR   Specific Gravity, Urine 1.024 1.005 - 1.030   pH 5.0 5.0 - 8.0   Glucose, UA NEGATIVE NEGATIVE mg/dL   Hgb urine dipstick NEGATIVE NEGATIVE   Bilirubin Urine NEGATIVE NEGATIVE   Ketones, ur NEGATIVE NEGATIVE mg/dL   Protein, ur NEGATIVE NEGATIVE mg/dL   Nitrite NEGATIVE NEGATIVE   Leukocytes, UA NEGATIVE NEGATIVE    Comment: Performed at [facility], [address] [place], [place] [address]  Protime-INR     Status: Abnormal   Collection Time: 06/20/17  1:16 PM  Result Value Ref Range   Prothrombin Time 16.2 (H) 11.4 - 15.2 seconds   INR 1.31     Comment: Performed at [facility], [address] [place], [place] [address]  TSH     Status: None   Collection Time: 06/20/17  1:16 PM  Result Value Ref Range   TSH 3.047 0.350 - 4.500 uIU/mL    Comment: Performed by a 3rd Generation assay with a functional sensitivity of <=0.01  uIU/mL. Performed at [facility], [address] [place], [place] [address]   CKMB([facility] only)     Status: None   Collection Time: 06/20/17  1:16 PM  Result Value Ref Range   CK, MB 1.3 0.5 - 5.0 ng/mL    Comment: Performed at [facility], [place], [place] [address]  Iron and TIBC     Status: None   Collection Time: 06/20/17  1:16 PM  Result Value Ref Range   Iron 93 45 - 182 ug/dL   TIBC 392 250 - 450 ug/dL   Saturation Ratios 24 17.9 - 39.5 %   UIBC 299 ug/dL    Comment: Performed at [facility], [address] [place], [place] [address]  Ferritin     Status: None   Collection Time: 06/20/17  1:16 PM  Result Value Ref Range   Ferritin 116 24 - 336 ng/mL    Comment: Performed at [facility], [place], [place] [address]  Rapid HIV screen (HIV 1/2 Ab+Ag)     Status: None   Collection Time: 06/20/17  1:16 PM  Result Value Ref Range   HIV-1 P24 Antigen - HIV24 NON REACTIVE NON REACTIVE   HIV 1/2 Antibodies NON REACTIVE NON REACTIVE   Interpretation (HIV Ag Ab)      A non reactive test result means that HIV 1 or HIV 2 antibodies and HIV 1 p24 antigen were not detected in the specimen.    Comment: Performed at [facility], [address] [place], [place] [address]  Comprehensive metabolic panel     Status: Abnormal   Collection Time: 07/09/17  9:49 AM  Result Value Ref Range   Sodium 136 135 - 145 mmol/L   Potassium 4.0 3.5 - 5.1 mmol/L   Chloride 101 101 - 111 mmol/L   CO2 26 22 - 32 mmol/L   Glucose, Bld 105 (H) 65 - 99 mg/dL   BUN 20 6 - 20 mg/dL   Creatinine, Ser 1.27 (H) 0.61 - 1.24 mg/dL   Calcium 9.3 8.9 - 10.3 mg/dL   Total Protein 7.6 6.5 - 8.1 g/dL   Albumin 3.3 (L) 3.5 - 5.0 g/dL   AST 30 15 - 41 U/L   ALT 21 17 - 63 U/L   Alkaline Phosphatase 97 38 - 126 U/L   Total Bilirubin 0.5 0.3 - 1.2 mg/dL   GFR calc non Af Amer 49 (L) >60 mL/min   GFR calc Af Amer 57 (L) >60 mL/min     Comment: (NOTE) The eGFR has been calculated using the CKD EPI equation. This calculation has not been validated in all clinical situations. eGFR's persistently <60 mL/min signify possible Chronic Kidney Disease.    Anion gap 9 5 - 15    Comment: Performed at [facility], [address], [place], [place] [address]    Radiology No results found.  Assessment/Plan  Hypertension goal BP (blood pressure) < 140/90 blood pressure control important in reducing the progression of atherosclerotic disease. On appropriate oral medications.   CKD (chronic kidney disease) stage 3, GFR 30-59 ml/min No apparent deterioration after procedure  Hyperlipidemia LDL goal <70 lipid control important in reducing the progression of atherosclerotic disease. Continue statin therapy   Peripheral vascular disease ([place]) His noninvasive studies today demonstrate noncompressible ABIs but good triphasic waveforms bilaterally with normal digital pressures of 105 on the right and 100 on the left with good pulsatile deflections. Clinically he is doing well with no worrisome, limb threatening symptoms.  We will stretch out his follow-up to 6 months at this point.  Continue current medical regimen.    Leotis Pain, MD  09/02/2017 2:36 PM    This note was created with Dragon medical transcription system.  Any errors from  dictation are purely unintentional

## 2017-09-04 ENCOUNTER — Ambulatory Visit (INDEPENDENT_AMBULATORY_CARE_PROVIDER_SITE_OTHER): Payer: Medicare HMO

## 2017-09-04 ENCOUNTER — Other Ambulatory Visit: Payer: Self-pay

## 2017-09-04 DIAGNOSIS — R0602 Shortness of breath: Secondary | ICD-10-CM

## 2017-09-04 DIAGNOSIS — I2510 Atherosclerotic heart disease of native coronary artery without angina pectoris: Secondary | ICD-10-CM | POA: Diagnosis not present

## 2017-09-19 ENCOUNTER — Ambulatory Visit: Payer: Medicare HMO | Admitting: Family Medicine

## 2017-09-19 ENCOUNTER — Encounter: Payer: Self-pay | Admitting: Family Medicine

## 2017-09-19 VITALS — BP 134/74 | HR 74 | Temp 98.4°F | Ht 62.0 in | Wt 151.1 lb

## 2017-09-19 DIAGNOSIS — I10 Essential (primary) hypertension: Secondary | ICD-10-CM | POA: Diagnosis not present

## 2017-09-19 DIAGNOSIS — N183 Chronic kidney disease, stage 3 unspecified: Secondary | ICD-10-CM

## 2017-09-19 DIAGNOSIS — R82998 Other abnormal findings in urine: Secondary | ICD-10-CM

## 2017-09-19 DIAGNOSIS — D649 Anemia, unspecified: Secondary | ICD-10-CM | POA: Diagnosis not present

## 2017-09-19 DIAGNOSIS — E782 Mixed hyperlipidemia: Secondary | ICD-10-CM | POA: Diagnosis not present

## 2017-09-19 DIAGNOSIS — E538 Deficiency of other specified B group vitamins: Secondary | ICD-10-CM

## 2017-09-19 DIAGNOSIS — Z5181 Encounter for therapeutic drug level monitoring: Secondary | ICD-10-CM

## 2017-09-19 DIAGNOSIS — K759 Inflammatory liver disease, unspecified: Secondary | ICD-10-CM | POA: Diagnosis not present

## 2017-09-19 DIAGNOSIS — I739 Peripheral vascular disease, unspecified: Secondary | ICD-10-CM | POA: Diagnosis not present

## 2017-09-19 DIAGNOSIS — R350 Frequency of micturition: Secondary | ICD-10-CM

## 2017-09-19 LAB — POCT URINALYSIS DIPSTICK
APPEARANCE: ABNORMAL
Bilirubin, UA: NEGATIVE
Glucose, UA: NEGATIVE
Ketones, UA: NEGATIVE
NITRITE UA: POSITIVE
Spec Grav, UA: 1.025 (ref 1.010–1.025)
Urobilinogen, UA: 0.2 E.U./dL
pH, UA: 5 (ref 5.0–8.0)

## 2017-09-19 MED ORDER — SULFAMETHOXAZOLE-TRIMETHOPRIM 800-160 MG PO TABS: 1.0000 | ORAL_TABLET | Freq: Two times a day (BID) | ORAL | 0 refills | Status: DC

## 2017-09-19 NOTE — Assessment & Plan Note (Signed)
Check B12 

## 2017-09-19 NOTE — Assessment & Plan Note (Signed)
Reviewed note by vascular doctor; goal LDL less than 70

## 2017-09-19 NOTE — Assessment & Plan Note (Signed)
Monitor; avoid NSAIDs 

## 2017-09-19 NOTE — Assessment & Plan Note (Signed)
Check lipids today 

## 2017-09-19 NOTE — Patient Instructions (Signed)
We'll get labs today If you have not heard anything from my staff in a week about any orders/referrals/studies from today, please contact us here to follow-up [phone]  

## 2017-09-19 NOTE — Assessment & Plan Note (Signed)
Check liver and kidneys 

## 2017-09-19 NOTE — Assessment & Plan Note (Signed)
Controlled today; limit salt 

## 2017-09-19 NOTE — Progress Notes (Signed)
BP 134/74 (BP Location: Left Arm, Patient Position: Sitting, Cuff Size: Normal)   Pulse 74   Temp 98.4 F (36.9 C) (Oral)   Ht 5\' 2"  (1.575 m)   Wt 151 lb 1.6 oz (68.5 kg)   SpO2 96%   BMI 27.64 kg/m    Subjective:    Patient ID: [name] [name], male    DOB: 1927-08-15, 84 y.o.   MRN: [id number]  HPI: [name] [name] is a 84 y.o. male  Chief Complaint  Patient presents with  . Follow-up    HPI Patient is here for f/u Just saw cardiologist and vein doctor Dr. Wyn Quaker saw him on 09/02/17; note reviewed; peripheral artery disease; feet do not get cold; no new ulcers; ABIs done CKD stable per Dr. Driscilla Grammes note, did look like Cr went up a bit between Dec 17th and Jan 9th Goal LDL cholesterol less than 70; out of statin just a day Lab Results  Component Value Date   CHOL 164 03/24/2017   HDL 39 (L) 03/24/2017   LDLCALC 100 (H) 03/24/2017   LDLDIRECT 85.4 02/08/2009   TRIG 150 (H) 03/24/2017   CHOLHDL 4.2 03/24/2017   Doesn't have to go back to the foot doctor He has problems with urination; getting up 1-2 x a night, but it was up to 3 x a night; urine does not smell strong; urgency; no dysuria One of his doctors wants a B12 checked Hx of hepatitis; saw GI doctor; they think it was due to the surgery, not his cholesterol medicine; daughter says GI doctor turned him loose  Depression screen [facility] 2/9 09/19/2017 06/19/2017 06/16/2017 03/24/2017 11/19/2016  Decreased Interest 0 0 0 0 0  Down, Depressed, Hopeless 0 0 0 0 0  PHQ - 2 Score 0 0 0 0 0    Relevant past medical, surgical, family and social history reviewed Past Medical History:  Diagnosis Date  . Arthritis   . CKD (chronic kidney disease) stage 3, GFR 30-59 ml/min (HCC) 12/29/2015  . Dry gangrene (HCC) 06/16/2017   Great toe and 5th toe LEFT foot; Dr. Wyn Quaker  . Hyperlipidemia   . Hypertension   . Left leg DVT (HCC) 01/2014  . Myocardial infarction (HCC)   . Pulmonary embolism (HCC) 01/2014  . Thoracic aorta  atherosclerosis (HCC) 10/08/2016   Past Surgical History:  Procedure Laterality Date  . CORONARY STENT PLACEMENT  2006  . JOINT REPLACEMENT  2011 & 2012   both hips  . len replaced Left 2015  . LOWER EXTREMITY ANGIOGRAPHY N/A 05/26/2017   Procedure: Lower Extremity Angiography;  Surgeon: Annice Needy, MD;  Location: [facility];  Service: Cardiovascular;  Laterality: N/A;  . MELANOMA EXCISION     40 years ago   Family History  Problem Relation Age of Onset  . Arthritis Mother   . Arthritis Father   . Depression Father   . Hypertension Daughter   . Pulmonary Hypertension Daughter    Social History   Tobacco Use  . Smoking status: Former Smoker    Types: Pipe    Last attempt to quit: 1970    Years since quitting: 49.2  . Smokeless tobacco: Never Used  Substance Use Topics  . Alcohol use: No    Alcohol/week: 0.0 oz  . Drug use: No    Interim medical history since last visit reviewed. Allergies and medications reviewed  Review of Systems Per HPI unless specifically indicated above     Objective:  BP 134/74 (BP Location: Left Arm, Patient Position: Sitting, Cuff Size: Normal)   Pulse 74   Temp 98.4 F (36.9 C) (Oral)   Ht 5\' 2"  (1.575 m)   Wt 151 lb 1.6 oz (68.5 kg)   SpO2 96%   BMI 27.64 kg/m   Wt Readings from Last 3 Encounters:  09/19/17 151 lb 1.6 oz (68.5 kg)  09/02/17 151 lb 9.6 oz (68.8 kg)  08/13/17 149 lb (67.6 kg)    Physical Exam  Constitutional: He appears well-developed and well-nourished. No distress.  Pleasant and jovial elderly man; no acute distress  HENT:  Head: Normocephalic and atraumatic.  Eyes: EOM are normal. No scleral icterus.  Neck: No thyromegaly present.  Cardiovascular: Normal rate and regular rhythm.  Pulmonary/Chest: Effort normal and breath sounds normal.  Abdominal: Soft. Bowel sounds are normal. He exhibits no distension.  Musculoskeletal: He exhibits no edema.  Neurological: Coordination normal.  Skin: Skin  is warm and dry. No pallor.  Psychiatric: He has a normal mood and affect.    Results for orders placed or performed in visit on 09/19/17  POCT Urinalysis Dipstick  Result Value Ref Range   Color, UA yellow    Clarity, UA cloudy    Glucose, UA neg    Bilirubin, UA neg    Ketones, UA neg    Spec Grav, UA 1.025 1.010 - 1.025   Blood, UA TRACE    pH, UA 5.0 5.0 - 8.0   Protein, UA TRACE    Urobilinogen, UA 0.2 0.2 or 1.0 E.U./dL   Nitrite, UA POSITIVE    Leukocytes, UA Moderate (2+) (A) Negative   Appearance Abnormal    Odor NONE       Assessment & Plan:   Problem List Items Addressed This Visit      Cardiovascular and Mediastinum   Peripheral vascular disease (HCC)    Reviewed note by vascular doctor; goal LDL less than 70      Relevant Orders   Lipid panel   Essential hypertension - Primary    Controlled today; limit salt        Genitourinary   CKD (chronic kidney disease) stage 3, GFR 30-59 ml/min (HCC)    Monitor; avoid NSAIDs        Other   Anemia   Relevant Orders   CBC with Differential/Platelet   Vitamin B12 deficiency    Check B12      Relevant Orders   Vitamin B12   Mixed hyperlipidemia    Check lipids today      Relevant Orders   Lipid panel   Medication monitoring encounter    Check liver and kidneys      Relevant Orders   COMPLETE METABOLIC PANEL WITH GFR    Other Visit Diagnoses    Hepatitis       saw GI specialist; will check LFTs today; daughter says GI turned him loose   Relevant Orders   COMPLETE METABOLIC PANEL WITH GFR   Urinary frequency       Relevant Orders   POCT Urinalysis Dipstick (Completed)   Urine Culture   Leukocytes in urine       Relevant Orders   Urine Culture       Follow up plan: Return in about 4 months (around 01/19/2018) for follow-up visit with Dr. Sherie Don.  An after-visit summary was printed and given to the patient at check-out.  Please see the patient instructions which may contain other  information and recommendations  beyond what is mentioned above in the assessment and plan.  Meds ordered this encounter  Medications  . sulfamethoxazole-trimethoprim (BACTRIM DS,SEPTRA DS) 800-160 MG tablet    Sig: Take 1 tablet by mouth 2 (two) times daily.    Dispense:  14 tablet    Refill:  0    Orders Placed This Encounter  Procedures  . Urine Culture  . Vitamin B12  . CBC with Differential/Platelet  . COMPLETE METABOLIC PANEL WITH GFR  . Lipid panel  . POCT Urinalysis Dipstick

## 2017-09-20 LAB — COMPLETE METABOLIC PANEL WITH GFR
AG Ratio: 1.2 (calc) (ref 1.0–2.5)
ALT: 10 U/L (ref 9–46)
AST: 16 U/L (ref 10–35)
Albumin: 4.1 g/dL (ref 3.6–5.1)
Alkaline phosphatase (APISO): 75 U/L (ref 40–115)
BILIRUBIN TOTAL: 0.4 mg/dL (ref 0.2–1.2)
BUN/Creatinine Ratio: 16 (calc) (ref 6–22)
BUN: 20 mg/dL (ref 7–25)
CALCIUM: 9.7 mg/dL (ref 8.6–10.3)
CHLORIDE: 103 mmol/L (ref 98–110)
CO2: 29 mmol/L (ref 20–32)
Creat: 1.26 mg/dL — ABNORMAL HIGH (ref 0.70–1.11)
GFR, EST AFRICAN AMERICAN: 59 mL/min/{1.73_m2} — AB (ref >=60)
GFR, EST NON AFRICAN AMERICAN: 51 mL/min/{1.73_m2} — AB (ref >=60)
GLUCOSE: 121 mg/dL (ref 65–139)
Globulin: 3.3 g/dL (calc) (ref 1.9–3.7)
Potassium: 4.3 mmol/L (ref 3.5–5.3)
Sodium: 138 mmol/L (ref 135–146)
TOTAL PROTEIN: 7.4 g/dL (ref 6.1–8.1)

## 2017-09-20 LAB — LIPID PANEL
CHOL/HDL RATIO: 3.2 (calc) (ref <5.0)
Cholesterol: 116 mg/dL (ref <200)
HDL: 36 mg/dL — ABNORMAL LOW (ref >40)
LDL CHOLESTEROL (CALC): 61 mg/dL
NON-HDL CHOLESTEROL (CALC): 80 mg/dL (ref <130)
TRIGLYCERIDES: 105 mg/dL (ref <150)

## 2017-09-20 LAB — VITAMIN B12: Vitamin B-12: 1222 pg/mL — ABNORMAL HIGH (ref 200–1100)

## 2017-09-20 LAB — CBC WITH DIFFERENTIAL/PLATELET
BASOS PCT: 0.4 %
Basophils Absolute: 36 cells/uL (ref 0–200)
EOS ABS: 374 {cells}/uL (ref 15–500)
Eosinophils Relative: 4.2 %
HCT: 41.3 % (ref 38.5–50.0)
Hemoglobin: 13 g/dL — ABNORMAL LOW (ref 13.2–17.1)
Lymphs Abs: 1558 cells/uL (ref 850–3900)
MCH: 26.6 pg — AB (ref 27.0–33.0)
MCHC: 31.5 g/dL — ABNORMAL LOW (ref 32.0–36.0)
MCV: 84.5 fL (ref 80.0–100.0)
MPV: 9.6 fL (ref 7.5–12.5)
Monocytes Relative: 7.2 %
NEUTROS ABS: 6292 {cells}/uL (ref 1500–7800)
Neutrophils Relative %: 70.7 %
PLATELETS: 233 10*3/uL (ref 140–400)
RBC: 4.89 10*6/uL (ref 4.20–5.80)
RDW: 13.8 % (ref 11.0–15.0)
Total Lymphocyte: 17.5 %
WBC: 8.9 10*3/uL (ref 3.8–10.8)
WBCMIX: 641 {cells}/uL (ref 200–950)

## 2017-09-22 ENCOUNTER — Other Ambulatory Visit: Payer: Self-pay | Admitting: Family Medicine

## 2017-09-22 DIAGNOSIS — N3000 Acute cystitis without hematuria: Secondary | ICD-10-CM

## 2017-09-22 DIAGNOSIS — A4902 Methicillin resistant Staphylococcus aureus infection, unspecified site: Secondary | ICD-10-CM

## 2017-09-22 LAB — URINE CULTURE
MICRO NUMBER: 90365277
SPECIMEN QUALITY:: ADEQUATE

## 2017-09-22 NOTE — Progress Notes (Signed)
Continue Rx for MRSA bladder infection See urologist I spoke with daughter about the urine culture, then we were disconnected; I called back, left detailed message about the rest of the labs She can check her messages on her phone CRM created

## 2017-10-06 NOTE — Progress Notes (Signed)
10/07/2017 10:43 AM   [name] [name] [birth year] [id number]  Referring provider: Kerman [name], [name] P, MD [address] [place], [place] [id number]  No chief complaint on file.   HPI: Patient is a 84 year old Caucasian male who is referred to us by Dr. Sherie [name] for MRSA UTI with his daughter, [name] [name].    He does not have a history of recurrent UTI'[name].    His symptoms with a urinary tract infection consist of increase of nocturia.  He denies dysuria, gross hematuria, suprapubic pain, back pain, abdominal pain or flank pain associated with UTI'[name].  He has not had any recent fevers, chills, nausea or vomiting associated with UTI'[name].   He is currently experiencing frequency, mild urgency and nocturia x3  for the last 6 months.  Patient denies any gross hematuria, dysuria or suprapubic/flank pain.  Patient denies any fevers, chills, nausea or vomiting.   He does not have a history of nephrolithiasis, GU surgery or GU trauma.   He denies constipation and/or diarrhea.   He does not engage in good perineal hygiene.  He does not take tub baths.   He does not have incontinence.    CT angiogram in 05/2017 noted no definite adrenal nodule. Mild thinning of renal parenchyma without hydronephrosis. Homogeneous renal enhancement. Urinary bladder is physiologically distended.     PMH: Past Medical History:  Diagnosis Date  . Arthritis   . CKD (chronic kidney disease) stage 3, GFR 30-59 ml/min (HCC) 12/29/2015  . Dry gangrene (HCC) 06/16/2017   Great toe and 5th toe LEFT foot; Dr. Wyn [name]  . Hyperlipidemia   . Hypertension   . Left leg DVT (HCC) 01/2014  . Myocardial infarction (HCC)   . Pulmonary embolism (HCC) 01/2014  . Thoracic aorta atherosclerosis (HCC) 10/08/2016    Surgical History: Past Surgical History:  Procedure Laterality Date  . CORONARY STENT PLACEMENT  2006  . JOINT REPLACEMENT  2011 & 2012   both hips  . len replaced Left 2015  . LOWER EXTREMITY ANGIOGRAPHY N/A  05/26/2017   Procedure: Lower Extremity Angiography;  Surgeon: [name] [name], [name] S, MD;  Location: [facility];  Service: Cardiovascular;  Laterality: N/A;  . MELANOMA EXCISION     40 years ago    Home Medications:  Allergies as of 10/07/2017      Reactions   Aspirin Anaphylaxis, Swelling, Shortness Of Breath   REACTION: terrible bp reaction/wheezing   Bee Pollen Other (See Comments)   ragweed   Pollen Extract    ragweed   Rabbit Epithelium    rabbits      Medication List        Accurate as of 10/07/17 10:43 AM. Always use your most recent med list.          albuterol 108 (90 Base) MCG/ACT inhaler Commonly known as:  PROVENTIL HFA;VENTOLIN HFA Inhale 2 puffs into the lungs every 4 (four) hours as needed for wheezing or shortness of breath.   apixaban 5 MG Tabs tablet Commonly known as:  ELIQUIS Take 2 tablets (10 mg total) by mouth 2 (two) times daily. Till 06/02/17 And then 5 mg bid from 06/03/17   atenolol 50 MG tablet Commonly known as:  TENORMIN TAKE 1 TABLET (50 MG TOTAL) BY MOUTH DAILY.   atorvastatin 10 MG tablet Commonly known as:  LIPITOR Take 1 tablet (10 mg total) by mouth at bedtime.   chlorhexidine 4 % external liquid Commonly known as:  HIBICLENS Apply topically daily as needed.  CLARITIN 10 MG tablet Generic drug:  loratadine Take 1 tablet (10 mg total) by mouth daily as needed for allergies.   donepezil 5 MG tablet Commonly known as:  ARICEPT Take 1 tablet by mouth every evening.   FISH OIL PO Take 1 capsule by mouth 2 (two) times daily.   gentamicin cream 0.1 % Commonly known as:  GARAMYCIN Apply 1 application topically 3 (three) times daily.   sulfamethoxazole-trimethoprim 800-160 MG tablet Commonly known as:  BACTRIM DS,SEPTRA DS Take 1 tablet by mouth 2 (two) times daily.   vitamin B-12 1000 MCG tablet Commonly known as:  CYANOCOBALAMIN Take 1 tablet by mouth daily.   VITAMIN D-1000 MAX ST 1000 units tablet Generic drug:   Cholecalciferol Take 5 tablets by mouth daily.       Allergies:  Allergies  Allergen Reactions  . Aspirin Anaphylaxis, Swelling and Shortness Of Breath    REACTION: terrible bp reaction/wheezing  . Bee Pollen Other (See Comments)    ragweed  . Pollen Extract     ragweed  . Rabbit Epithelium     rabbits    Family History: Family History  Problem Relation Age of Onset  . Arthritis Mother   . Arthritis Father   . Depression Father   . Hypertension Daughter   . Pulmonary Hypertension Daughter     Social History:  reports that he quit smoking about 49 years ago. His smoking use included pipe. He has never used smokeless tobacco. He reports that he does not drink alcohol or use drugs.  ROS: UROLOGY Frequent Urination?: Yes Hard to postpone urination?: Yes Burning/pain with urination?: Yes Get up at night to urinate?: Yes Leakage of urine?: No Urine stream starts and stops?: No Trouble starting stream?: No Do you have to strain to urinate?: No Blood in urine?: No Urinary tract infection?: Yes Sexually transmitted disease?: No Injury to kidneys or bladder?: No Painful intercourse?: No Weak stream?: No Erection problems?: No Penile pain?: No  Gastrointestinal Nausea?: No Vomiting?: No Indigestion/heartburn?: Yes Diarrhea?: No Constipation?: No  Constitutional Fever: No Night sweats?: Yes Weight loss?: Yes Fatigue?: Yes  Skin Skin rash/lesions?: No Itching?: No  Eyes Blurred vision?: No Double vision?: No  Ears/Nose/Throat Sore throat?: No Sinus problems?: No  Hematologic/Lymphatic Swollen glands?: No Easy bruising?: No  Cardiovascular Leg swelling?: Yes Chest pain?: No  Respiratory Cough?: No Shortness of breath?: Yes  Endocrine Excessive thirst?: No  Musculoskeletal Back pain?: No Joint pain?: Yes  Neurological Headaches?: Yes Dizziness?: Yes  Psychologic Depression?: Yes Anxiety?: No  Physical Exam: BP (!) 148/77   Pulse  62   Resp 16   Ht 5\' 3"  (1.6 m)   Wt 151 lb (68.5 kg)   SpO2 98%   BMI 26.75 kg/m   Constitutional: Well nourished. Alert and oriented, No acute distress. HEENT:  AT, moist mucus membranes. Trachea midline, no masses. Cardiovascular: No clubbing, cyanosis, or edema. Respiratory: Normal respiratory effort, no increased work of breathing. GI: Abdomen is soft, non tender, non distended, no abdominal masses. Liver and spleen not palpable.  No hernias appreciated.  Stool sample for occult testing is not indicated.   GU: No CVA tenderness.  No bladder fullness or masses.  Patient with uncircumcised phallus.   Foreskin easily retracted Urethral meatus is patent.  No penile discharge. No penile lesions or rashes. Scrotum without lesions, cysts, rashes and/or edema.  Testicles are located scrotally bilaterally. No masses are appreciated in the testicles. Left and right epididymis are normal. Rectal:  Patient with  normal sphincter tone. Anus and perineum without scarring or rashes. No rectal masses are appreciated. Prostate is approximately 60 grams, no nodules are appreciated. Seminal vesicles are normal.  Scant amount of frank blood on glove after an exam.   Skin: No rashes, bruises or suspicious lesions. Lymph: No cervical or inguinal adenopathy. Neurologic: Grossly intact, no focal deficits, moving all 4 extremities. Psychiatric: Normal mood and affect.  Laboratory Data: Lab Results  Component Value Date   WBC 8.9 09/19/2017   HGB 13.0 (L) 09/19/2017   HCT 41.3 09/19/2017   MCV 84.5 09/19/2017   PLT 233 09/19/2017    Lab Results  Component Value Date   CREATININE 1.26 (H) 09/19/2017    No results found for: PSA  No results found for: TESTOSTERONE  No results found for: HGBA1C  Lab Results  Component Value Date   TSH 3.047 06/20/2017       Component Value Date/Time   CHOL 116 09/19/2017 1010   CHOL 179 03/13/2015 0906   HDL 36 (L) 09/19/2017 1010   HDL 26 (L) 03/13/2015  0906   CHOLHDL 3.2 09/19/2017 1010   VLDL 51 (H) 10/08/2016 1131   LDLCALC 61 09/19/2017 1010    Lab Results  Component Value Date   AST 16 09/19/2017   Lab Results  Component Value Date   ALT 10 09/19/2017   No components found for: ALKALINEPHOPHATASE No components found for: BILIRUBINTOTAL  No results found for: ESTRADIOL  Urinalysis See HPI and Epic.   I have reviewed the labs.   Pertinent Imaging: CLINICAL DATA:  Decreased pulse in left leg. Sudden onset of left leg pain. Patient reports recent fall in the shower.  EXAM: CT ANGIOGRAPHY OF ABDOMINAL AORTA WITH ILIOFEMORAL RUNOFF  TECHNIQUE: Multidetector CT imaging of the abdomen, pelvis and lower extremities was performed using the standard protocol during bolus administration of intravenous contrast. Multiplanar CT image reconstructions and MIPs were obtained to evaluate the vascular anatomy.  CONTRAST:  ISOVUE-370 IOPAMIDOL (ISOVUE-370) INJECTION 76%  COMPARISON:  None.  FINDINGS: VASCULAR  Aorta: Moderate calcified and noncalcified atheromatous plaque without aneurysm, dissection or periaortic stranding. Scattered luminal stenosis of less than 25%.  Celiac: Calcified plaque at the origin with less than 50% stenosis, mild poststenotic dilatation.  SMA: Mild plaque at the origin without significant stenosis. Replaced hepatic artery is incidentally noted arising from the SMA.  Renals: Plaque at the origin without significant stenosis. No evidence of vasculitis or fibromuscular dysplasia.  IMA: Patent with plaque at the origin.  RIGHT Lower Extremity  Inflow: Common, internal and external iliac arteries are patent without evidence of aneurysm, dissection, vasculitis or significant stenosis. Mild calcified plaque.  Outflow: Common, superficial and profunda femoral arteries and the popliteal artery are patent without evidence of aneurysm, dissection, vasculitis or significant  stenosis. Mild calcified and noncalcified plaque in the proximal common femoral, and throughout the superficial femoral artery.  Runoff: Moderate calcified plaque of the popliteal artery. Posterior tibial and peroneal arteries are patent to the mid lower leg, small in caliber. No flow in the distal peroneal artery. Densely calcified anterior tibial artery which may be occluded proximally, however reconstitutes distally at the ankle.  LEFT Lower Extremity  Inflow: Common, internal and external iliac arteries are patent without evidence of aneurysm, dissection, vasculitis or significant stenosis. Mild calcified plaque.  Outflow: Common, superficial and profunda femoral arteries and the popliteal artery are patent without evidence of aneurysm, dissection, vasculitis or significant stenosis. Calcified and noncalcified  plaques throughout the SFA, with plaque distally causing moderate stenosis. Abrupt occlusion of the distal SFA at the level of the mid distal femur extending to the popliteal artery. Curvilinear density in the periphery of the distal most SFA may be trace reconstitution versus calcific plaque. Mild stranding about the distal SFA.  Runoff: Reconstitution of the anterior tibial artery and tibioperoneal trunk with some flow to the ankle. Possible embolic disease with filling defects in the proximal posterior tibial and peroneal arteries and anterior tibial artery at the level of the ankle.  Veins: No obvious venous abnormality within the limitations of this arterial phase study.  Review of the MIP images confirms the above findings.  NON-VASCULAR  Lower chest: Linear atelectasis in the left lower lobe. The heart is normal in size.  Hepatobiliary: No focal lesion allowing for arterial phase imaging. Possible small stones or sludge in the gallbladder without abnormal gallbladder distention.  Pancreas: Motion artifact through the pancreas, no obvious  ductal dilatation or inflammation.  Spleen: Normal in size and arterial phase imaging. Splenule superiorly.  Adrenals/Urinary Tract: No definite adrenal nodule. Mild thinning of renal parenchyma without hydronephrosis. Homogeneous renal enhancement. Urinary bladder is physiologically distended.  Stomach/Bowel: Stomach is decompressed. No bowel dilatation or inflammation. Rather extensive colonic diverticulosis from the splenic flexure distally. No diverticulitis.  Lymphatic: Small paraesophageal lymph nodes adjacent of the distal esophagus, of uncertain etiology. No additional enlarged lymph nodes in the abdomen or pelvis.  Reproductive: Prostate gland obscured by streak artifact from bilateral hip prostheses.  Other: No ascites.  No free air.  Musculoskeletal: Bilateral hip arthroplasties. T11 compression fracture appears remote. Multilevel degenerative change in the lumbar spine. No acute osseous abnormalities in the lower extremities.  IMPRESSION: VASCULAR  1. Total or near total occlusion of the distal left superficial femoral artery extending through the popliteal. Distal reconstitution of the calf vessels with intraluminal filling defects in the calf vessels suspicious for embolic disease. 2. Absent flow in the distal right peroneal artery, of uncertain acuity. 3. Calcified and noncalfied atheromatous plaque throughout the abdominal aorta and lower extremity vasculature, severe in bilateral trifurcations.  NON-VASCULAR  1. No acute abnormality. 2. Colonic diverticulosis. Possible cholelithiasis or sludge in the gallbladder without complicating features. These results were called by telephone at the time of interpretation on 05/23/2017 at 9:22 pm to PA [facility] , who verbally acknowledged these results.   Electronically Signed   By: Rubye Oaks M.D.   On: 05/23/2017 21:23 I have independently reviewed the films.    Assessment & Plan:     1. MRSA UTI CATH UA is negative and patient is asymptomatic at today'[name] visit Obtain a RUS to rule out nidus for infection Advised patient and his daughter to pull back the foreskin and clean the penis with Hibiclens twice daily   2. Blood from rectum Encourage the patient and his daughter to notify his PCP regarding blood found rectal exam as this may be a sign of colon cancer  3. Nocturia I explained to the patient that nocturia is often multi-factorial and difficult to treat.  Sleeping disorders, heart conditions, peripheral vascular disease, diabetes, an enlarged prostate for men, an urethral stricture causing bladder outlet obstruction and/or certain medications can contribute to nocturia. I have suggested that the patient avoid caffeine after noon and alcohol in the evening.  He or she may also benefit from fluid restrictions after 6:00 in the evening and voiding just prior to bedtime. I have explained that research studies have  showed that over 84% of patients with sleep apnea reported frequent nighttime urination.   With sleep apnea, oxygen decreases, carbon dioxide increases, the blood become more acidic, the heart rate drops and blood vessels in the lung constrict.  The body is then alerted that something is very wrong. The sleeper must wake enough to reopen the airway. By this time, the heart is racing and experiences a false signal of fluid overload. The heart excretes a hormone-like protein that tells the body to get rid of sodium and water, resulting in nocturia. The patient may benefit from a discussion with his or her primary care physician to see if he or she has risk factors for sleep apnea or other sleep disturbances and obtaining a sleep study.  Return for RUS report .  These notes generated with voice recognition software. I apologize for typographical errors.  Michiel Cowboy, PA-C  [facility] [address], [address] [place], [place]  [address] [phone]

## 2017-10-07 ENCOUNTER — Ambulatory Visit: Payer: Medicare HMO | Admitting: Urology

## 2017-10-07 ENCOUNTER — Encounter: Payer: Self-pay | Admitting: Urology

## 2017-10-07 VITALS — BP 148/77 | HR 62 | Resp 16 | Ht 63.0 in | Wt 151.0 lb

## 2017-10-07 DIAGNOSIS — K625 Hemorrhage of anus and rectum: Secondary | ICD-10-CM

## 2017-10-07 DIAGNOSIS — M170 Bilateral primary osteoarthritis of knee: Secondary | ICD-10-CM | POA: Diagnosis not present

## 2017-10-07 DIAGNOSIS — Z22322 Carrier or suspected carrier of Methicillin resistant Staphylococcus aureus: Secondary | ICD-10-CM

## 2017-10-07 DIAGNOSIS — R351 Nocturia: Secondary | ICD-10-CM | POA: Diagnosis not present

## 2017-10-07 LAB — URINALYSIS, COMPLETE
BILIRUBIN UA: NEGATIVE
Glucose, UA: NEGATIVE
Ketones, UA: NEGATIVE
LEUKOCYTES UA: NEGATIVE
Nitrite, UA: NEGATIVE
Protein, UA: NEGATIVE
RBC UA: NEGATIVE
SPEC GRAV UA: 1.015 (ref 1.005–1.030)
UUROB: 0.2 mg/dL (ref 0.2–1.0)
pH, UA: 5.5 (ref 5.0–7.5)

## 2017-10-07 LAB — MICROSCOPIC EXAMINATION
EPITHELIAL CELLS (NON RENAL): NONE SEEN /HPF (ref 0–10)
RBC MICROSCOPIC, UA: NONE SEEN /HPF (ref 0–2)
WBC, UA: NONE SEEN /hpf (ref 0–5)

## 2017-10-07 MED ORDER — CHLORHEXIDINE GLUCONATE 4 % EX LIQD: Freq: Every day | CUTANEOUS | 0 refills | Status: DC | PRN

## 2017-10-07 NOTE — Progress Notes (Signed)
In and Out Catheterization  Patient is present today for a I & O catheterization due to mrsa infection of urine. Patient was cleaned and prepped in a sterile fashion with betadine and Lidocaine 2% jelly was instilled into the urethra.  A 14 FR cath was inserted no complications were noted , 80ml of urine return was noted, urine was yellow in color. A clean urine sample was collected for UA. Bladder was drained  And catheter was removed with out difficulty.    Preformed by: Nydia Bouton, CMA

## 2017-10-07 NOTE — Patient Instructions (Signed)

## 2017-10-10 ENCOUNTER — Other Ambulatory Visit: Payer: Self-pay | Admitting: Family Medicine

## 2017-10-10 NOTE — Telephone Encounter (Signed)
Liver enzymes back to normal rx approved

## 2017-11-02 ENCOUNTER — Telehealth: Payer: Self-pay | Admitting: Family Medicine

## 2017-11-02 DIAGNOSIS — K625 Hemorrhage of anus and rectum: Secondary | ICD-10-CM

## 2017-11-02 NOTE — Telephone Encounter (Signed)
I read [name] [name]'s note about frank blood in the rectum and her concern about possible colon cancer. Please contact patient/daughter, confirm they agree with seeing someone about this, then refer patient to general surgeon of choice for evaluation of bright red blood on rectal exam

## 2017-11-03 ENCOUNTER — Ambulatory Visit
Admission: RE | Admit: 2017-11-03 | Discharge: 2017-11-03 | Disposition: A | Discharge status: Home / Self Care | Payer: Medicare HMO | Source: Ambulatory Visit | Attending: Urology | Admitting: Urology

## 2017-11-03 DIAGNOSIS — N390 Urinary tract infection, site not specified: Secondary | ICD-10-CM | POA: Insufficient documentation

## 2017-11-03 DIAGNOSIS — Z22322 Carrier or suspected carrier of Methicillin resistant Staphylococcus aureus: Secondary | ICD-10-CM

## 2017-11-03 NOTE — Telephone Encounter (Signed)
Left detailed voicemail

## 2017-11-03 NOTE — Telephone Encounter (Signed)
Pt daughter sherry would like to proceed with general surgeon there is no preference

## 2017-11-03 NOTE — Addendum Note (Signed)
Addended by: Davene Costain on: 11/03/2017 10:41 AM   Modules accepted: Orders

## 2017-11-04 ENCOUNTER — Ambulatory Visit (INDEPENDENT_AMBULATORY_CARE_PROVIDER_SITE_OTHER): Payer: Medicare HMO | Admitting: Vascular Surgery

## 2017-11-04 ENCOUNTER — Encounter (INDEPENDENT_AMBULATORY_CARE_PROVIDER_SITE_OTHER): Payer: Self-pay | Admitting: Vascular Surgery

## 2017-11-04 ENCOUNTER — Other Ambulatory Visit (INDEPENDENT_AMBULATORY_CARE_PROVIDER_SITE_OTHER): Payer: Self-pay | Admitting: Vascular Surgery

## 2017-11-04 ENCOUNTER — Ambulatory Visit (INDEPENDENT_AMBULATORY_CARE_PROVIDER_SITE_OTHER): Payer: Medicare HMO

## 2017-11-04 VITALS — BP 152/71 | HR 69 | Resp 16 | Ht 62.0 in | Wt 149.0 lb

## 2017-11-04 DIAGNOSIS — E782 Mixed hyperlipidemia: Secondary | ICD-10-CM

## 2017-11-04 DIAGNOSIS — I739 Peripheral vascular disease, unspecified: Secondary | ICD-10-CM

## 2017-11-04 DIAGNOSIS — I10 Essential (primary) hypertension: Secondary | ICD-10-CM | POA: Diagnosis not present

## 2017-11-04 NOTE — Progress Notes (Signed)
Subjective:    Patient ID: [name] [name], male    DOB: March 29, 1928, 84 y.o.   MRN: [id number] Chief Complaint  Patient presents with  . Follow-up    Gangrene Toes   Patient last seen on September 02, 2017 and evaluation of peripheral artery disease.  The patient is status post a left lower extremity angiogram with intervention to the PTA TPT and popliteal artery on May 26, 2017.  The patient presents at the request of his daughter who feels that his toes are becoming "necrotic".  The patient notes 3 "areas" on his left big toe which are slow to heal.  The patient does experience claudication symptoms to the left calf intermittently.  He denies any rest pain.  The patient underwent a bilateral ABI which was notable for right: Triphasic anterior tibial artery/biphasic posterior tibial artery.  Left: Monophasic anterior tibial artery no waveforms noted to the posterior tibial artery or great toe.  When compared to the previous examination on 09/02/2017 the patient had triphasic flow to the left anterior tibial artery and biphasic flow to the left posterior tibial artery with normal great toe pressures.  The patient denies any fever, nausea vomiting.  Review of Systems  Constitutional: Negative.   HENT: Negative.   Eyes: Negative.   Respiratory: Negative.   Cardiovascular: Negative.   Gastrointestinal: Negative.   Endocrine: Negative.   Genitourinary: Negative.   Musculoskeletal: Negative.   Skin: Positive for wound.  Allergic/Immunologic: Negative.   Neurological: Negative.   Hematological: Negative.   Psychiatric/Behavioral: Negative.       Objective:   Physical Exam  Constitutional: He is oriented to person, place, and time. He appears well-developed and well-nourished. No distress.  HENT:  Head: Normocephalic and atraumatic.  Right Ear: External ear normal.  Left Ear: External ear normal.  Eyes: Pupils are equal, round, and reactive to light. Conjunctivae and EOM are normal.    Neck: Normal range of motion.  Cardiovascular: Normal rate, regular rhythm, normal heart sounds and intact distal pulses.  Pulses:      Radial pulses are 2+ on the right side, and 2+ on the left side.       Dorsalis pedis pulses are 1+ on the right side.       Posterior tibial pulses are 1+ on the right side.  Unable to palpate left pedal pulses  Pulmonary/Chest: Effort normal and breath sounds normal.  Musculoskeletal: Normal range of motion. He exhibits no edema.  Neurological: He is alert and oriented to person, place, and time.  Skin: He is not diaphoretic.  There are 3 small areas to the left great big toe with small ulcerations.  They are not gangrenous in nature.  There is no cellulitis noted.  There is no drainage noted.  Psychiatric: He has a normal mood and affect. His behavior is normal. Judgment and thought content normal.  Vitals reviewed.  BP (!) 152/71 (BP Location: Right Arm, Patient Position: Sitting)   Pulse 69   Resp 16   Ht  (1.575 m)   Wt 149 lb (67.6 kg)   BMI 27.25 kg/m   Past Medical History:  Diagnosis Date  . Arthritis   . CKD (chronic kidney disease) stage 3, GFR 30-59 ml/min (HCC) 12/29/2015  . Dry gangrene (HCC) 06/16/2017   Great toe and 5th toe LEFT foot; Dr. Wyn Quaker  . Hyperlipidemia   . Hypertension   . Left leg DVT (HCC) 01/2014  . Myocardial infarction (HCC)   .  Pulmonary embolism (HCC) 01/2014  . Thoracic aorta atherosclerosis (HCC) 10/08/2016   Social History   Socioeconomic History  . Marital status: Married    Spouse name: Not on file  . Number of children: Not on file  . Years of education: Not on file  . Highest education level: Not on file  Occupational History    Employer: RETIRED  Social Needs  . Financial resource strain: Not on file  . Food insecurity:    Worry: Not on file    Inability: Not on file  . Transportation needs:    Medical: Not on file    Non-medical: Not on file  Tobacco Use  . Smoking status: Former  Smoker    Types: Pipe    Last attempt to quit: 1970    Years since quitting: 49.3  . Smokeless tobacco: Never Used  Substance and Sexual Activity  . Alcohol use: No    Alcohol/week: 0.0 oz  . Drug use: No  . Sexual activity: Not Currently  Lifestyle  . Physical activity:    Days per week: Not on file    Minutes per session: Not on file  . Stress: Not on file  Relationships  . Social connections:    Talks on phone: Not on file    Gets together: Not on file    Attends religious service: Not on file    Active member of club or organization: Not on file    Attends meetings of clubs or organizations: Not on file    Relationship status: Not on file  . Intimate partner violence:    Fear of current or ex partner: Not on file    Emotionally abused: Not on file    Physically abused: Not on file    Forced sexual activity: Not on file  Other Topics Concern  . Not on file  Social History Narrative  . Not on file   Past Surgical History:  Procedure Laterality Date  . CORONARY STENT PLACEMENT  2006  . JOINT REPLACEMENT  2011 & 2012   both hips  . len replaced Left 2015  . LOWER EXTREMITY ANGIOGRAPHY N/A 05/26/2017   Procedure: Lower Extremity Angiography;  Surgeon: Annice Needy, MD;  Location: [facility];  Service: Cardiovascular;  Laterality: N/A;  . MELANOMA EXCISION     40 years ago   Family History  Problem Relation Age of Onset  . Arthritis Mother   . Arthritis Father   . Depression Father   . Hypertension Daughter   . Pulmonary Hypertension Daughter    Allergies  Allergen Reactions  . Aspirin Anaphylaxis, Swelling and Shortness Of Breath    REACTION: terrible bp reaction/wheezing  . Bee Pollen Other (See Comments)    ragweed  . Pollen Extract     ragweed  . Rabbit Epithelium     rabbits      Assessment & Plan:  Patient last seen on September 02, 2017 and evaluation of peripheral artery disease.  The patient is status post a left lower extremity angiogram  with intervention to the PTA TPT and popliteal artery on May 26, 2017.  The patient presents at the request of his daughter who feels that his toes are becoming "necrotic".  The patient notes 3 "areas" on his left big toe which are slow to heal.  The patient does experience claudication symptoms to the left calf intermittently.  He denies any rest pain.  The patient underwent a bilateral ABI which was notable  for right: Triphasic anterior tibial artery/biphasic posterior tibial artery.  Left: Monophasic anterior tibial artery no waveforms noted to the posterior tibial artery or great toe.  When compared to the previous examination on 09/02/2017 the patient had triphasic flow to the left anterior tibial artery and biphasic flow to the left posterior tibial artery with normal great toe pressures.  The patient denies any fever, nausea vomiting.  1. Peripheral vascular disease (HCC) - Stable Patient presents with 3 small nonhealing ulcerations to the left great big toe. When compared to the previous ABI conducted on September 02, 2017 there has been a decrease in arterial blood flow to the left lower extremity. Unable to palpate pedal pulses to the left foot on exam Recommends a left lower extremity angiogram with possible intervention.  If appropriate an attempt to revascularize the leg can be made at that time. Procedure, risks and benefits explained to the patient.  All questions answered.  The patient wishes to move forward.  2. Mixed hyperlipidemia - Stable Encouraged good control as its slows the progression of atherosclerotic disease  3. Essential hypertension - Stable Encouraged good control as its slows the progression of atherosclerotic disease  Current Outpatient Medications on File Prior to Visit  Medication Sig Dispense Refill  . albuterol (PROVENTIL HFA;VENTOLIN HFA) 108 (90 Base) MCG/ACT inhaler Inhale 2 puffs into the lungs every 4 (four) hours as needed for wheezing or shortness of  breath. 1 Inhaler 1  . apixaban (ELIQUIS) 5 MG TABS tablet Take 2 tablets (10 mg total) by mouth 2 (two) times daily. Till 06/02/17 And then 5 mg bid from 06/03/17 60 tablet 5  . atenolol (TENORMIN) 50 MG tablet TAKE 1 TABLET (50 MG TOTAL) BY MOUTH DAILY. 90 tablet 1  . atorvastatin (LIPITOR) 10 MG tablet TAKE 1 TABLET BY MOUTH EVERYDAY AT BEDTIME 30 tablet 5  . chlorhexidine (HIBICLENS) 4 % external liquid Apply topically daily as needed. 120 mL 0  . Cholecalciferol (VITAMIN D-1000 MAX ST) 1000 units tablet Take 5 tablets by mouth daily.    Marland Kitchen donepezil (ARICEPT) 5 MG tablet Take 1 tablet by mouth every evening.  6  . gentamicin cream (GARAMYCIN) 0.1 % Apply 1 application topically 3 (three) times daily. 30 g 1  . loratadine (CLARITIN) 10 MG tablet Take 1 tablet (10 mg total) by mouth daily as needed for allergies.    . Omega-3 Fatty Acids (FISH OIL PO) Take 1 capsule by mouth 2 (two) times daily.    . vitamin B-12 (CYANOCOBALAMIN) 1000 MCG tablet Take 1 tablet by mouth daily.    Marland Kitchen sulfamethoxazole-trimethoprim (BACTRIM DS,SEPTRA DS) 800-160 MG tablet Take 1 tablet by mouth 2 (two) times daily. (Patient not taking: Reported on 11/04/2017) 14 tablet 0   No current facility-administered medications on file prior to visit.    There are no Patient Instructions on file for this visit. No follow-ups on file.  Awa Bachicha A Anacaren Kohan, PA-C

## 2017-11-05 ENCOUNTER — Encounter (INDEPENDENT_AMBULATORY_CARE_PROVIDER_SITE_OTHER): Payer: Self-pay

## 2017-11-05 ENCOUNTER — Ambulatory Visit: Payer: Medicare HMO | Admitting: Urology

## 2017-11-05 ENCOUNTER — Other Ambulatory Visit (INDEPENDENT_AMBULATORY_CARE_PROVIDER_SITE_OTHER): Payer: Self-pay | Admitting: Vascular Surgery

## 2017-11-07 ENCOUNTER — Encounter
Admission: RE | Admit: 2017-11-07 | Discharge: 2017-11-07 | Disposition: A | Discharge status: Home / Self Care | Payer: Medicare HMO | Source: Ambulatory Visit | Attending: Vascular Surgery | Admitting: Vascular Surgery

## 2017-11-07 DIAGNOSIS — I129 Hypertensive chronic kidney disease with stage 1 through stage 4 chronic kidney disease, or unspecified chronic kidney disease: Secondary | ICD-10-CM | POA: Diagnosis not present

## 2017-11-07 DIAGNOSIS — Z7901 Long term (current) use of anticoagulants: Secondary | ICD-10-CM | POA: Diagnosis not present

## 2017-11-07 DIAGNOSIS — Z91030 Bee allergy status: Secondary | ICD-10-CM | POA: Diagnosis not present

## 2017-11-07 DIAGNOSIS — Z8261 Family history of arthritis: Secondary | ICD-10-CM | POA: Diagnosis not present

## 2017-11-07 DIAGNOSIS — I252 Old myocardial infarction: Secondary | ICD-10-CM | POA: Diagnosis not present

## 2017-11-07 DIAGNOSIS — M1990 Unspecified osteoarthritis, unspecified site: Secondary | ICD-10-CM | POA: Diagnosis not present

## 2017-11-07 DIAGNOSIS — Z96643 Presence of artificial hip joint, bilateral: Secondary | ICD-10-CM | POA: Diagnosis not present

## 2017-11-07 DIAGNOSIS — Z86711 Personal history of pulmonary embolism: Secondary | ICD-10-CM | POA: Diagnosis not present

## 2017-11-07 DIAGNOSIS — Z86718 Personal history of other venous thrombosis and embolism: Secondary | ICD-10-CM | POA: Diagnosis not present

## 2017-11-07 DIAGNOSIS — E782 Mixed hyperlipidemia: Secondary | ICD-10-CM | POA: Diagnosis not present

## 2017-11-07 DIAGNOSIS — Z79899 Other long term (current) drug therapy: Secondary | ICD-10-CM | POA: Diagnosis not present

## 2017-11-07 DIAGNOSIS — Z8249 Family history of ischemic heart disease and other diseases of the circulatory system: Secondary | ICD-10-CM | POA: Diagnosis not present

## 2017-11-07 DIAGNOSIS — Z87891 Personal history of nicotine dependence: Secondary | ICD-10-CM | POA: Diagnosis not present

## 2017-11-07 DIAGNOSIS — Z9109 Other allergy status, other than to drugs and biological substances: Secondary | ICD-10-CM | POA: Diagnosis not present

## 2017-11-07 DIAGNOSIS — L97521 Non-pressure chronic ulcer of other part of left foot limited to breakdown of skin: Secondary | ICD-10-CM | POA: Diagnosis not present

## 2017-11-07 DIAGNOSIS — N183 Chronic kidney disease, stage 3 (moderate): Secondary | ICD-10-CM | POA: Diagnosis not present

## 2017-11-07 DIAGNOSIS — Z886 Allergy status to analgesic agent status: Secondary | ICD-10-CM | POA: Diagnosis not present

## 2017-11-07 DIAGNOSIS — Z955 Presence of coronary angioplasty implant and graft: Secondary | ICD-10-CM | POA: Diagnosis not present

## 2017-11-07 DIAGNOSIS — I70245 Atherosclerosis of native arteries of left leg with ulceration of other part of foot: Secondary | ICD-10-CM | POA: Diagnosis not present

## 2017-11-07 LAB — CREATININE, SERUM
Creatinine, Ser: 1.27 mg/dL — ABNORMAL HIGH (ref 0.61–1.24)
GFR calc Af Amer: 57 mL/min — ABNORMAL LOW (ref >60)
GFR calc non Af Amer: 49 mL/min — ABNORMAL LOW (ref >60)

## 2017-11-07 LAB — BUN: BUN: 20 mg/dL (ref 6–20)

## 2017-11-09 MED ORDER — CEFAZOLIN SODIUM-DEXTROSE 2-4 GM/100ML-% IV SOLN
2.0000 g | Freq: Once | INTRAVENOUS | Status: AC
  Administered 2017-11-10: 2 g via INTRAVENOUS

## 2017-11-10 ENCOUNTER — Encounter: Payer: Self-pay | Admitting: *Deleted

## 2017-11-10 ENCOUNTER — Encounter
Admission: RE | Disposition: A | Discharge status: Home / Self Care | Payer: Self-pay | Source: Ambulatory Visit | Attending: Vascular Surgery

## 2017-11-10 ENCOUNTER — Ambulatory Visit
Admission: RE | Admit: 2017-11-10 | Discharge: 2017-11-10 | Disposition: A | Discharge status: Home / Self Care | Payer: Medicare HMO | Source: Ambulatory Visit | Attending: Vascular Surgery | Admitting: Vascular Surgery

## 2017-11-10 DIAGNOSIS — I252 Old myocardial infarction: Secondary | ICD-10-CM | POA: Insufficient documentation

## 2017-11-10 DIAGNOSIS — Z87891 Personal history of nicotine dependence: Secondary | ICD-10-CM | POA: Diagnosis not present

## 2017-11-10 DIAGNOSIS — Z96643 Presence of artificial hip joint, bilateral: Secondary | ICD-10-CM | POA: Insufficient documentation

## 2017-11-10 DIAGNOSIS — I70245 Atherosclerosis of native arteries of left leg with ulceration of other part of foot: Secondary | ICD-10-CM | POA: Insufficient documentation

## 2017-11-10 DIAGNOSIS — Z86718 Personal history of other venous thrombosis and embolism: Secondary | ICD-10-CM | POA: Insufficient documentation

## 2017-11-10 DIAGNOSIS — L97909 Non-pressure chronic ulcer of unspecified part of unspecified lower leg with unspecified severity: Secondary | ICD-10-CM

## 2017-11-10 DIAGNOSIS — I129 Hypertensive chronic kidney disease with stage 1 through stage 4 chronic kidney disease, or unspecified chronic kidney disease: Secondary | ICD-10-CM | POA: Insufficient documentation

## 2017-11-10 DIAGNOSIS — I70299 Other atherosclerosis of native arteries of extremities, unspecified extremity: Secondary | ICD-10-CM

## 2017-11-10 DIAGNOSIS — N183 Chronic kidney disease, stage 3 (moderate): Secondary | ICD-10-CM | POA: Insufficient documentation

## 2017-11-10 DIAGNOSIS — M1990 Unspecified osteoarthritis, unspecified site: Secondary | ICD-10-CM | POA: Insufficient documentation

## 2017-11-10 DIAGNOSIS — Z886 Allergy status to analgesic agent status: Secondary | ICD-10-CM | POA: Insufficient documentation

## 2017-11-10 DIAGNOSIS — Z8249 Family history of ischemic heart disease and other diseases of the circulatory system: Secondary | ICD-10-CM | POA: Insufficient documentation

## 2017-11-10 DIAGNOSIS — Z8261 Family history of arthritis: Secondary | ICD-10-CM | POA: Insufficient documentation

## 2017-11-10 DIAGNOSIS — L97521 Non-pressure chronic ulcer of other part of left foot limited to breakdown of skin: Secondary | ICD-10-CM | POA: Insufficient documentation

## 2017-11-10 DIAGNOSIS — Z86711 Personal history of pulmonary embolism: Secondary | ICD-10-CM | POA: Insufficient documentation

## 2017-11-10 DIAGNOSIS — E782 Mixed hyperlipidemia: Secondary | ICD-10-CM | POA: Insufficient documentation

## 2017-11-10 DIAGNOSIS — Z9109 Other allergy status, other than to drugs and biological substances: Secondary | ICD-10-CM | POA: Insufficient documentation

## 2017-11-10 DIAGNOSIS — Z79899 Other long term (current) drug therapy: Secondary | ICD-10-CM | POA: Insufficient documentation

## 2017-11-10 DIAGNOSIS — Z955 Presence of coronary angioplasty implant and graft: Secondary | ICD-10-CM | POA: Insufficient documentation

## 2017-11-10 DIAGNOSIS — Z91030 Bee allergy status: Secondary | ICD-10-CM | POA: Insufficient documentation

## 2017-11-10 DIAGNOSIS — I70248 Atherosclerosis of native arteries of left leg with ulceration of other part of lower left leg: Secondary | ICD-10-CM | POA: Diagnosis not present

## 2017-11-10 DIAGNOSIS — Z7901 Long term (current) use of anticoagulants: Secondary | ICD-10-CM | POA: Insufficient documentation

## 2017-11-10 HISTORY — PX: LOWER EXTREMITY ANGIOGRAPHY: CATH118251

## 2017-11-10 SURGERY — LOWER EXTREMITY ANGIOGRAPHY
Anesthesia: Moderate Sedation | Laterality: Left

## 2017-11-10 MED ORDER — SODIUM CHLORIDE 0.9 % IV SOLN
INTRAVENOUS | Status: DC
  Administered 2017-11-10: 11:00:00 via INTRAVENOUS

## 2017-11-10 MED ORDER — ONDANSETRON HCL 4 MG/2ML IJ SOLN: 4.0000 mg | Freq: Four times a day (QID) | INTRAMUSCULAR | Status: DC | PRN

## 2017-11-10 MED ORDER — LABETALOL HCL 5 MG/ML IV SOLN: 10.0000 mg | INTRAVENOUS | Status: DC | PRN

## 2017-11-10 MED ORDER — LIDOCAINE HCL (PF) 1 % IJ SOLN
INTRAMUSCULAR | Status: AC
  Filled 2017-11-10: qty 30

## 2017-11-10 MED ORDER — MIDAZOLAM HCL 2 MG/2ML IJ SOLN
INTRAMUSCULAR | Status: DC | PRN
  Administered 2017-11-10: 2 mg via INTRAVENOUS

## 2017-11-10 MED ORDER — FENTANYL CITRATE (PF) 100 MCG/2ML IJ SOLN
INTRAMUSCULAR | Status: AC
  Filled 2017-11-10: qty 2

## 2017-11-10 MED ORDER — SODIUM CHLORIDE 0.9% FLUSH: 3.0000 mL | Freq: Two times a day (BID) | INTRAVENOUS | Status: DC

## 2017-11-10 MED ORDER — SODIUM CHLORIDE 0.9 % IV SOLN: INTRAVENOUS | Status: DC

## 2017-11-10 MED ORDER — HYDRALAZINE HCL 20 MG/ML IJ SOLN: 5.0000 mg | INTRAMUSCULAR | Status: DC | PRN

## 2017-11-10 MED ORDER — MIDAZOLAM HCL 5 MG/5ML IJ SOLN
INTRAMUSCULAR | Status: AC
  Filled 2017-11-10: qty 5

## 2017-11-10 MED ORDER — FAMOTIDINE 20 MG PO TABS: 40.0000 mg | ORAL_TABLET | ORAL | Status: DC | PRN

## 2017-11-10 MED ORDER — SODIUM CHLORIDE 0.9% FLUSH: 3.0000 mL | INTRAVENOUS | Status: DC | PRN

## 2017-11-10 MED ORDER — HEPARIN SODIUM (PORCINE) 1000 UNIT/ML IJ SOLN
INTRAMUSCULAR | Status: DC | PRN
  Administered 2017-11-10: 4000 [IU] via INTRAVENOUS

## 2017-11-10 MED ORDER — FENTANYL CITRATE (PF) 100 MCG/2ML IJ SOLN
INTRAMUSCULAR | Status: DC | PRN
  Administered 2017-11-10: 50 ug via INTRAVENOUS

## 2017-11-10 MED ORDER — METHYLPREDNISOLONE SODIUM SUCC 125 MG IJ SOLR: 125.0000 mg | INTRAMUSCULAR | Status: DC | PRN

## 2017-11-10 MED ORDER — SODIUM CHLORIDE 0.9 % IV BOLUS: 250.0000 mL | Freq: Once | INTRAVENOUS | Status: DC

## 2017-11-10 MED ORDER — HYDROMORPHONE HCL 1 MG/ML IJ SOLN: 1.0000 mg | Freq: Once | INTRAMUSCULAR | Status: DC | PRN

## 2017-11-10 MED ORDER — SODIUM CHLORIDE 0.9 % IV SOLN: 250.0000 mL | INTRAVENOUS | Status: DC | PRN

## 2017-11-10 MED ORDER — HEPARIN SODIUM (PORCINE) 1000 UNIT/ML IJ SOLN
INTRAMUSCULAR | Status: AC
  Filled 2017-11-10: qty 1

## 2017-11-10 MED ORDER — IOPAMIDOL (ISOVUE-300) INJECTION 61%
INTRAVENOUS | Status: DC | PRN
  Administered 2017-11-10: 70 mL via INTRA_ARTERIAL

## 2017-11-10 MED ORDER — ACETAMINOPHEN 325 MG PO TABS: 650.0000 mg | ORAL_TABLET | ORAL | Status: DC | PRN

## 2017-11-10 SURGICAL SUPPLY — 23 items
BALLN LUTONIX 018 4X100X130 (BALLOONS) ×3
BALLN LUTONIX 018 5X220X130 (BALLOONS) ×3
BALLN ULTRVRSE 3X300X150 (BALLOONS) ×2
BALLN ULTRVRSE 3X300X150 OTW (BALLOONS) ×1
BALLOON LUTONIX 018 4X100X130 (BALLOONS) ×1 IMPLANT
BALLOON LUTONIX 018 5X220X130 (BALLOONS) ×1 IMPLANT
BALLOON ULTRVRSE 3X300X150 OTW (BALLOONS) ×1 IMPLANT
CATH BEACON 5 .038 100 VERT TP (CATHETERS) ×3 IMPLANT
CATH PIG 70CM (CATHETERS) ×3 IMPLANT
CATH VS15FR (CATHETERS) ×3 IMPLANT
COVER PROBE U/S 5X48 (MISCELLANEOUS) ×3 IMPLANT
DEVICE PRESTO INFLATION (MISCELLANEOUS) ×3 IMPLANT
DEVICE STARCLOSE SE CLOSURE (Vascular Products) ×3 IMPLANT
DEVICE TORQUE .025-.038 (MISCELLANEOUS) ×3 IMPLANT
GLIDEWIRE ADV .035X180CM (WIRE) ×3 IMPLANT
GLIDEWIRE STIFF .35X180X3 HYDR (WIRE) ×3 IMPLANT
PACK ANGIOGRAPHY (CUSTOM PROCEDURE TRAY) ×3 IMPLANT
SHEATH ANL2 6FRX45 HC (SHEATH) ×3 IMPLANT
SHEATH BRITE TIP 5FRX11 (SHEATH) ×3 IMPLANT
STENT VIABAHN 6X250X120 (Permanent Stent) ×3 IMPLANT
TUBING CONTRAST HIGH PRESS 72 (TUBING) ×3 IMPLANT
WIRE G V18X300CM (WIRE) ×3 IMPLANT
WIRE J 3MM .035X145CM (WIRE) ×3 IMPLANT

## 2017-11-10 NOTE — H&P (Signed)
[facility] History & Physical Update  The patient was interviewed and re-examined.  [name] previous History and Physical has been reviewed and is unchanged.  There is no change in the plan of care. We plan to proceed with the scheduled procedure.  Festus Barren, MD  11/10/2017, 10:42 AM

## 2017-11-10 NOTE — Op Note (Signed)
[facility]  Percutaneous Study/Intervention Procedural Note   Date of Surgery: 11/10/2017  Surgeon(s):Cordel Drewes    Assistants:none  Pre-operative Diagnosis: PAD with ulceration left lower extremity  Post-operative diagnosis:  Same  Procedure(s) Performed:             1.  Ultrasound guidance for vascular access right femoral artery             2.  Catheter placement into left anterior tibial artery from right femoral approach             3.  Aortogram and selective left lower extremity angiogram             4.  Percutaneous transluminal angioplasty of left anterior tibial artery with 3 mm diameter by 30 cm length angioplasty balloon             5.   Percutaneous transluminal angioplasty of the left popliteal artery and distal SFA with 5 mm diameter by 22 cm length Lutonix drug-coated angioplasty balloon  6.  Viabahn stent placement to the left popliteal artery with 6 mm diameter by 25 cm length covered stent postdilated with a 4 mm Lutonix at the AT origin and a 5 mm balloon in the proximal portion of the stent             7.  StarClose closure device right femoral artery  EBL: 5 cc  Contrast: 70 cc  Fluoro Time: 8 minutes  Moderate Conscious Sedation Time: approximately 45 minutes using 2 mg of Versed and 50 Mcg of Fentanyl              Indications:  Patient is a 84 y.o.male with nonhealing ulcerations. The patient has noninvasive study showing reduced perfusion in the left lower extremity. The patient is brought in for angiography for further evaluation and potential treatment.  Due to the limb threatening nature of the situation, angiogram was performed for attempted limb salvage. The patient is aware that if the procedure fails, amputation would be expected.  The patient also understands that even with successful revascularization, amputation may still be required due to the severity of the situation.  Risks and benefits are discussed and informed consent is  obtained.   Procedure:  The patient was identified and appropriate procedural time out was performed.  The patient was then placed supine on the table and prepped and draped in the usual sterile fashion. Moderate conscious sedation was administered during a face to face encounter with the patient throughout the procedure with my supervision of the RN administering medicines and monitoring the patient's vital signs, pulse oximetry, telemetry and mental status throughout from the start of the procedure until the patient was taken to the recovery room. Ultrasound was used to evaluate the right common femoral artery.  It was patent .  A digital ultrasound image was acquired.  A Seldinger needle was used to access the right common femoral artery under direct ultrasound guidance and a permanent image was performed.  A 0.035 J wire was advanced without resistance and a 5Fr sheath was placed.  Pigtail catheter was placed into the aorta and an AP aortogram was performed. This demonstrated normal renal arteries and normal aorta and iliac segments without significant stenosis.  There was a very steep aortic bifurcation.  Because of this, and up using a VS 1 catheter and an advantage wire next.  I then crossed the aortic bifurcation and advanced to the left femoral head. Selective left lower  extremity angiogram was then performed. This demonstrated normal common femoral artery, profunda femoris, and proximal to mid superficial femoral arteries.  The distal SFA at Hunter's canal occluded just above the previously placed stent and did not reconstitute until the anterior tibial artery with occlusion of the proximal anterior tibial artery as well.  The anterior tibial artery was basically the only runoff to the foot.  There was about a 90% stenosis in the mid anterior tibial artery in addition to the proximal occlusion.  The patient was systemically heparinized and a 6 [place] [employer]sell sheath was then placed over the [employer] wire. I then used a Kumpe catheter and the advantage wire to navigate through the occlusion surprisingly easily and confirm intraluminal flow in the proximal to mid anterior tibial artery.  I then placed a 0.018 wire and proceeded with treatment.  A 3 mm diameter by 30 cm length angioplasty balloon was used to treat the anterior tibial artery from the distal popliteal artery down to the distal anterior tibial artery encompassing both the proximal occlusion as well as about a 90% stenosis in the midsegment.  I then used a 5 mm diameter by 22 cm length Lutonix drug-coated angioplasty balloon to inflate from the distal popliteal artery up to the distal SFA to encompass the previously occluded stent.  The anterior tibial inflation was 16 atm for 1 minute and the popliteal inflation was 12 atm for 1 minute.  Completion angiogram showed multiple areas of greater than 50% stenosis with what appeared to be some chronic thrombus in the previous stent in the left popliteal artery.  I elected to place a covered stent in the popliteal artery. Viabahn stent placement to the left popliteal artery was performed with 6 mm diameter by 25 cm length covered stent postdilated with a 4 mm Lutonix at the AT origin and a 5 mm balloon in the proximal portion of the stent.  Completion angiogram now showed less than 20% residual stenosis in the popliteal artery or anterior tibial artery with in-line flow to the foot although it was still somewhat sluggish.  This was likely due to some degree of spasm as well as intrinsic heart disease.  I elected to terminate the procedure. The sheath was removed and StarClose closure device was deployed in the right femoral artery with excellent hemostatic result. The patient was taken to the recovery room in stable condition having tolerated the procedure well.  Findings:               Aortogram:  This demonstrated normal renal arteries and normal aorta and iliac segments without significant  stenosis.             Left lower Extremity:  This demonstrated normal common femoral artery, profunda femoris, and proximal to mid superficial femoral arteries.  The distal SFA at Hunter's canal occluded just above the previously placed stent and did not reconstitute until the anterior tibial artery with occlusion of the proximal anterior tibial artery as well.  The anterior tibial artery was basically the only runoff to the foot.  There was about a 90% stenosis in the mid segment of the anterior tibial artery but it was then continuous to the foot.   Disposition: Patient was taken to the recovery room in stable condition having tolerated the procedure well.  Complications: None  [name] [name] 11/10/2017 12:15 PM   This note was created with Dragon Medical transcription system. Any errors in dictation are purely unintentional.

## 2017-11-12 NOTE — Progress Notes (Signed)
11/13/2017 11:25 AM   [name] [name] Feb 15, 1928 [id number]  Referring provider: Kerman Passey, MD [address] [place], [place] [address]  Chief Complaint  Patient presents with  . Follow-up    HPI: Patient is a 84 year old Caucasian male with a history of MRSA UTI and nocturia who presents today for renal ultrasound report.  Background history Patient is a 84 year old Caucasian male who is referred to [place] by Dr. Sherie Don for MRSA UTI with his daughter, [name].   He does not have a history of recurrent UTI's.  His symptoms with a urinary tract infection consist of increase of nocturia.  He denies dysuria, gross hematuria, suprapubic pain, back pain, abdominal pain or flank pain associated with UTI's.  He has not had any recent fevers, chills, nausea or vomiting associated with UTI's.   He is currently experiencing frequency, mild urgency and nocturia x3  for the last 6 months.  Patient denies any gross hematuria, dysuria or suprapubic/flank pain.  Patient denies any fevers, chills, nausea or vomiting.  He does not have a history of nephrolithiasis, GU surgery or GU trauma.  He denies constipation and/or diarrhea.   He does not engage in good perineal hygiene.  He does not take tub baths.  He does not have incontinence.  CT angiogram in 05/2017 noted no definite adrenal nodule. Mild thinning of renal parenchyma without hydronephrosis. Homogeneous renal enhancement. Urinary bladder is physiologically distended.    RUS on 11/03/2017 noted mild renal parenchyma thinning and minimal increased echogenicity bilaterally may reflect changes of medical renal disease. No hydronephrosis.  1 x 1.3 x 0.4 cm echogenic structure base of the bladder possibly related to prostate gland. Primary bladder abnormality not excluded.  Today, he is not having any urinary symptoms.   Patient denies any gross hematuria, dysuria or suprapubic/flank pain.  Patient denies any fevers, chills, nausea or  vomiting.   His UA today with moderate bacteria.   Patient denies any gross hematuria, dysuria or suprapubic/flank pain.  Patient denies any fevers, chills, nausea or vomiting.   PMH: Past Medical History:  Diagnosis Date  . Arthritis   . CKD (chronic kidney disease) stage 3, GFR 30-59 ml/min (HCC) 12/29/2015  . Dry gangrene (HCC) 06/16/2017   Great toe and 5th toe LEFT foot; Dr. Wyn Quaker  . Hyperlipidemia   . Hypertension   . Left leg DVT (HCC) 01/2014  . Myocardial infarction (HCC)   . Pulmonary embolism (HCC) 01/2014  . Thoracic aorta atherosclerosis (HCC) 10/08/2016    Surgical History: Past Surgical History:  Procedure Laterality Date  . CORONARY STENT PLACEMENT  2006  . JOINT REPLACEMENT  2011 & 2012   both hips  . len replaced Left 2015  . LOWER EXTREMITY ANGIOGRAPHY N/A 05/26/2017   Procedure: Lower Extremity Angiography;  Surgeon: Annice Needy, MD;  Location: [facility] INVASIVE CV LAB;  Service: Cardiovascular;  Laterality: N/A;  . LOWER EXTREMITY ANGIOGRAPHY Left 11/10/2017   Procedure: LOWER EXTREMITY ANGIOGRAPHY;  Surgeon: Annice Needy, MD;  Location: [facility] INVASIVE CV LAB;  Service: Cardiovascular;  Laterality: Left;  Marland Kitchen MELANOMA EXCISION     40 years ago    Home Medications:  Allergies as of 11/13/2017      Reactions   Aspirin Anaphylaxis, Swelling, Shortness Of Breath   REACTION: terrible bp reaction/wheezing   Bee Pollen Other (See Comments)   ragweed   Pollen Extract    ragweed   Rabbit Epithelium    rabbits  Medication List        Accurate as of 11/13/17 11:25 AM. Always use your most recent med list.          albuterol 108 (90 Base) MCG/ACT inhaler Commonly known as:  PROVENTIL HFA;VENTOLIN HFA Inhale 2 puffs into the lungs every 4 (four) hours as needed for wheezing or shortness of breath.   apixaban 5 MG Tabs tablet Commonly known as:  ELIQUIS Take 2 tablets (10 mg total) by mouth 2 (two) times daily. Till 06/02/17 And then 5 mg bid from 06/03/17     atenolol 50 MG tablet Commonly known as:  TENORMIN TAKE 1 TABLET (50 MG TOTAL) BY MOUTH DAILY.   atorvastatin 10 MG tablet Commonly known as:  LIPITOR TAKE 1 TABLET BY MOUTH EVERYDAY AT BEDTIME   chlorhexidine 4 % external liquid Commonly known as:  HIBICLENS Apply topically daily as needed.   CLARITIN 10 MG tablet Generic drug:  loratadine Take 1 tablet (10 mg total) by mouth daily as needed for allergies.   donepezil 5 MG tablet Commonly known as:  ARICEPT Take 1 tablet by mouth every evening.   FISH OIL PO Take 1 capsule by mouth 2 (two) times daily.   gentamicin cream 0.1 % Commonly known as:  GARAMYCIN Apply 1 application topically 3 (three) times daily.   sulfamethoxazole-trimethoprim 800-160 MG tablet Commonly known as:  BACTRIM DS,SEPTRA DS Take 1 tablet by mouth 2 (two) times daily.   vitamin B-12 1000 MCG tablet Commonly known as:  CYANOCOBALAMIN Take 1 tablet by mouth daily.   VITAMIN D-1000 MAX ST 1000 units tablet Generic drug:  Cholecalciferol Take 5 tablets by mouth daily.       Allergies:  Allergies  Allergen Reactions  . Aspirin Anaphylaxis, Swelling and Shortness Of Breath    REACTION: terrible bp reaction/wheezing  . Bee Pollen Other (See Comments)    ragweed  . Pollen Extract     ragweed  . Rabbit Epithelium     rabbits    Family History: Family History  Problem Relation Age of Onset  . Arthritis Mother   . Arthritis Father   . Depression Father   . Hypertension Daughter   . Pulmonary Hypertension Daughter     Social History:  reports that he quit smoking about 49 years ago. His smoking use included pipe. He has never used smokeless tobacco. He reports that he does not drink alcohol or use drugs.  ROS: UROLOGY Frequent Urination?: No Hard to postpone urination?: No Burning/pain with urination?: No Get up at night to urinate?: No Leakage of urine?: No Urine stream starts and stops?: No Trouble starting stream?: No Do you  have to strain to urinate?: No Blood in urine?: No Urinary tract infection?: No Sexually transmitted disease?: No Injury to kidneys or bladder?: No Painful intercourse?: No Weak stream?: No Erection problems?: No Penile pain?: No  Gastrointestinal Nausea?: No Vomiting?: No Indigestion/heartburn?: Yes Diarrhea?: No Constipation?: No  Constitutional Fever: No Night sweats?: Yes Weight loss?: No Fatigue?: Yes  Skin Skin rash/lesions?: Yes Itching?: No  Eyes Blurred vision?: No Double vision?: No  Ears/Nose/Throat Sore throat?: No Sinus problems?: Yes  Hematologic/Lymphatic Swollen glands?: No Easy bruising?: Yes  Cardiovascular Leg swelling?: No Chest pain?: Yes  Respiratory Cough?: Yes Shortness of breath?: Yes  Endocrine Excessive thirst?: No  Musculoskeletal Back pain?: No Joint pain?: Yes  Neurological Headaches?: Yes Dizziness?: No  Psychologic Depression?: Yes Anxiety?: No  Physical Exam: BP (!) 172/67 (BP Location: Right Arm,  Patient Position: Sitting, Cuff Size: Normal)   Pulse 67   Ht  (1.575 m)   Wt 148 lb 3.2 oz (67.2 kg)   SpO2 99%   BMI 27.11 kg/m   Constitutional: Well nourished. Alert and oriented, No acute distress. HEENT:  AT, moist mucus membranes. Trachea midline, no masses. Cardiovascular: No clubbing, cyanosis, or edema. Respiratory: Normal respiratory effort, no increased work of breathing. Skin: No rashes, bruises or suspicious lesions. Lymph: No cervical or inguinal adenopathy. Neurologic: Grossly intact, no focal deficits, moving all 4 extremities. Psychiatric: Normal mood and affect.   Laboratory Data: Lab Results  Component Value Date   WBC 8.9 09/19/2017   HGB 13.0 (L) 09/19/2017   HCT 41.3 09/19/2017   MCV 84.5 09/19/2017   PLT 233 09/19/2017    Lab Results  Component Value Date   CREATININE 1.27 (H) 11/07/2017    No results found for: PSA  No results found for: TESTOSTERONE  No  results found for: HGBA1C  Lab Results  Component Value Date   TSH 3.047 06/20/2017       Component Value Date/Time   CHOL 116 09/19/2017 1010   CHOL 179 03/13/2015 0906   HDL 36 (L) 09/19/2017 1010   HDL 26 (L) 03/13/2015 0906   CHOLHDL 3.2 09/19/2017 1010   VLDL 51 (H) 10/08/2016 1131   LDLCALC 61 09/19/2017 1010    Lab Results  Component Value Date   AST 16 09/19/2017   Lab Results  Component Value Date   ALT 10 09/19/2017   No components found for: ALKALINEPHOPHATASE No components found for: BILIRUBINTOTAL  No results found for: ESTRADIOL  Urinalysis Moderate bacteria.  See Epic.   I have reviewed the labs.   Pertinent Imaging: CLINICAL DATA:  84 year old male with UTI.  Initial encounter.  EXAM: RENAL / URINARY TRACT ULTRASOUND COMPLETE  COMPARISON:  None.  FINDINGS: Right Kidney:  Length: 9.2 cm. Mild renal parenchymal thinning and minimal increased echogenicity. No hydronephrosis or mass.  Left Kidney:  Length: 10.1 cm. Mild renal parenchymal thinning and minimal increased echogenicity. No hydronephrosis or mass.  Bladder:  1 x 1.3 x 0.4 cm echogenic structure base of the bladder possibly related to prostate gland. Primary bladder abnormality not excluded.  IMPRESSION: Mild renal parenchyma thinning and minimal increased echogenicity bilaterally may reflect changes of medical renal disease. No hydronephrosis.  1 x 1.3 x 0.4 cm echogenic structure base of the bladder possibly related to prostate gland. Primary bladder abnormality not excluded.   Electronically Signed   By: Lacy Duverney M.D.   On: 11/03/2017 12:04  I have independently reviewed the films.    Assessment & Plan:    1. MRSA UTI RUS did not identify an nidus for infection Continue to pull back the foreskin and clean the penis with Hibiclens twice daily   2. Blood from rectum Dr Sherie Don aware and is being evaluated  3. Nocturia  Patient states he is only  getting up once a night now  4. Bladder mass Patient will be scheduled for a cystoscopy  I have explained to the patient that they will  be scheduled for a cystoscopy in our office to evaluate their bladder.  The cystoscopy consists of passing a tube with a lens up through their urethra and into their urinary bladder.   We will inject the urethra with a lidocaine gel prior to introducing the cystoscope to help with any discomfort during the procedure.   After the procedure, they might  experience blood in the urine and discomfort with urination.  This will abate after the first few voids.  I have  encouraged the patient to increase water intake  during this time.  Patient denies any allergies to lidocaine.    Return for cystoscopy for bladder mass seen on RUS.  These notes generated with voice recognition software. I apologize for typographical errors.  Michiel Cowboy, PA-C  [facility] [address] [place], [place] [address] [phone]

## 2017-11-13 ENCOUNTER — Ambulatory Visit: Payer: Medicare HMO | Admitting: Urology

## 2017-11-13 ENCOUNTER — Encounter: Payer: Self-pay | Admitting: Urology

## 2017-11-13 VITALS — BP 172/67 | HR 67 | Ht 62.0 in | Wt 148.2 lb

## 2017-11-13 DIAGNOSIS — Z22322 Carrier or suspected carrier of Methicillin resistant Staphylococcus aureus: Secondary | ICD-10-CM

## 2017-11-13 DIAGNOSIS — K625 Hemorrhage of anus and rectum: Secondary | ICD-10-CM

## 2017-11-13 DIAGNOSIS — R351 Nocturia: Secondary | ICD-10-CM | POA: Diagnosis not present

## 2017-11-13 DIAGNOSIS — N3289 Other specified disorders of bladder: Secondary | ICD-10-CM

## 2017-11-13 LAB — URINALYSIS, COMPLETE
BILIRUBIN UA: NEGATIVE
Glucose, UA: NEGATIVE
Ketones, UA: NEGATIVE
LEUKOCYTES UA: NEGATIVE
Nitrite, UA: NEGATIVE
Specific Gravity, UA: >1.03 — ABNORMAL HIGH (ref 1.005–1.030)
Urobilinogen, Ur: 0.2 mg/dL (ref 0.2–1.0)
pH, UA: 5 (ref 5.0–7.5)

## 2017-11-13 LAB — MICROSCOPIC EXAMINATION
EPITHELIAL CELLS (NON RENAL): NONE SEEN /HPF (ref 0–10)
RBC, UA: NONE SEEN /hpf (ref 0–2)
WBC, UA: NONE SEEN /hpf (ref 0–5)

## 2017-11-13 NOTE — Patient Instructions (Signed)
Cystoscopy  Cystoscopy is a procedure that is used to help diagnose and sometimes treat conditions that affect that lower urinary tract. The lower urinary tract includes the bladder and the tube that drains urine from the bladder out of the body (urethra). Cystoscopy is performed with a thin, tube-shaped instrument with a light and camera at the end (cystoscope). The cystoscope may be hard (rigid) or flexible, depending on the goal of the procedure.The cystoscope is inserted through the urethra, into the bladder.  Cystoscopy may be recommended if you have:   Urinary tractinfections that keep coming back (recurring).   Blood in the urine (hematuria).   Loss of bladder control (urinary incontinence) or an overactive bladder.   Unusual cells found in a urine sample.   A blockage in the urethra.   Painful urination.   An abnormality in the bladder found during an intravenous pyelogram (IVP) or CT scan.    Cystoscopy may also be done to remove a sample of tissue to be examined under a microscope (biopsy).  Tell a health care provider about:   Any allergies you have.   All medicines you are taking, including vitamins, herbs, eye drops, creams, and over-the-counter medicines.   Any problems you or family members have had with anesthetic medicines.   Any blood disorders you have.   Any surgeries you have had.   Any medical conditions you have.   Whether you are pregnant or may be pregnant.  What are the risks?  Generally, this is a safe procedure. However, problems may occur, including:   Infection.   Bleeding.   Allergic reactions to medicines.   Damage to other structures or organs.    What happens before the procedure?   Ask your health care provider about:  ? Changing or stopping your regular medicines. This is especially important if you are taking diabetes medicines or blood thinners.  ? Taking medicines such as aspirin and ibuprofen. These medicines can thin your blood. Do not take these medicines  before your procedure if your health care provider instructs you not to.   Follow instructions from your health care provider about eating or drinking restrictions.   You may be given antibiotic medicine to help prevent infection.   You may have an exam or testing, such as X-rays of the bladder, urethra, or kidneys.   You may have urine tests to check for signs of infection.   Plan to have someone take you home after the procedure.  What happens during the procedure?   To reduce your risk of infection,your health care team will wash or sanitize their hands.   You will be given one or more of the following:  ? A medicine to help you relax (sedative).  ? A medicine to numb the area (local anesthetic).   The area around the opening of your urethra will be cleaned.   The cystoscope will be passed through your urethra into your bladder.   Germ-free (sterile)fluid will flow through the cystoscope to fill your bladder. The fluid will stretch your bladder so that your surgeon can clearly examine your bladder walls.   The cystoscope will be removed and your bladder will be emptied.  The procedure may vary among health care providers and hospitals.  What happens after the procedure?   You may have some soreness or pain in your abdomen and urethra. Medicines will be available to help you.   You may have some blood in your urine.   Do not   drive for 24 hours if you received a sedative.  This information is not intended to replace advice given to you by your health care provider. Make sure you discuss any questions you have with your health care provider.  Document Released: 06/14/2000 Document Revised: 10/26/2015 Document Reviewed: 05/04/2015  Elsevier Interactive Patient Education  2018 [employer]

## 2017-11-17 LAB — CULTURE, URINE COMPREHENSIVE

## 2017-11-19 ENCOUNTER — Encounter: Payer: Self-pay | Admitting: General Surgery

## 2017-11-25 ENCOUNTER — Other Ambulatory Visit: Payer: Self-pay | Admitting: Urology

## 2017-11-25 ENCOUNTER — Telehealth: Payer: Self-pay

## 2017-11-25 ENCOUNTER — Ambulatory Visit: Payer: Medicare HMO | Admitting: Podiatry

## 2017-11-25 ENCOUNTER — Telehealth: Payer: Self-pay | Admitting: Family Medicine

## 2017-11-25 DIAGNOSIS — I96 Gangrene, not elsewhere classified: Secondary | ICD-10-CM

## 2017-11-25 MED ORDER — SULFAMETHOXAZOLE-TRIMETHOPRIM 800-160 MG PO TABS
1.0000 | ORAL_TABLET | Freq: Two times a day (BID) | ORAL | 0 refills | Status: DC
Start: 2017-11-25 — End: 2018-01-19

## 2017-11-25 NOTE — Telephone Encounter (Signed)
lmom for pt to call office

## 2017-11-25 NOTE — Telephone Encounter (Signed)
-----   Message from Harle Battiest, PA-C sent at 11/25/2017 11:19 AM EDT ----- Please let Mr. Wenker know that his urine culture is positive and he needs to start Septra DS twice daily.  I have sent a prescription to the [facility] for him.  He has a cystoscopy on 06/04, so he needs to start it today or tomorrow.

## 2017-11-25 NOTE — Progress Notes (Signed)
Septra sent to pharmacy

## 2017-11-25 NOTE — Telephone Encounter (Signed)
Copied from CRM [id number]. Topic: Referral - Request >> Nov 25, 2017  8:23 AM Maia Petties wrote: Reason for CRM: pt is needing referral to [facility]. He has been going there for gangrene on his foot but now his [employer] ins requires referrals again.

## 2017-11-27 ENCOUNTER — Telehealth: Payer: Self-pay | Admitting: Family Medicine

## 2017-11-27 NOTE — Telephone Encounter (Signed)
Patient's wife and daughter are here Please help them with the surgery referral from 11/03/17 They haven't heard anything yet

## 2017-11-27 NOTE — Telephone Encounter (Signed)
Type Date User Summary Attachment  General 11/19/2017 10:56 AM Gwenlyn Fudge: Referral message -  Note   ----- Message ----- From: Donney Dice Sent: 11/19/2017  10:52 AM To: Kerman Passey, MD Subject: Dorathy Kinsman                                         Thank you for your referral,however we have left several messages for the patient to contact the office. We mailed a letter asking the patient to call the office to schedule an appointment with Dr Shela Commons.  Lacretia Nicks.  Lemar Livings.

## 2017-11-27 NOTE — Telephone Encounter (Signed)
I tried to contact this patient's daughter via the number listed on file [phone]) but there was no answer. A message was left for her stating that [facility] has been trying to get in contact with them so that they can schedule [name] [name] and that a letter has been mailed out to them. I left [facility]'s office number [phone]) n the voicemail asking her to give their office a call at her earliest convenience.

## 2017-11-27 NOTE — Telephone Encounter (Signed)
Thank you, but if you could please contact wife and/or daughter; they need that information and phone number please; thank you

## 2017-11-27 NOTE — Telephone Encounter (Signed)
Thank you :)

## 2017-11-30 ENCOUNTER — Other Ambulatory Visit: Payer: Self-pay | Admitting: Family Medicine

## 2017-11-30 DIAGNOSIS — I10 Essential (primary) hypertension: Secondary | ICD-10-CM

## 2017-12-01 NOTE — Progress Notes (Signed)
D

## 2017-12-02 ENCOUNTER — Ambulatory Visit: Payer: Medicare HMO | Admitting: Podiatry

## 2017-12-02 ENCOUNTER — Encounter: Payer: Self-pay | Admitting: Urology

## 2017-12-02 ENCOUNTER — Ambulatory Visit: Payer: Medicare HMO | Admitting: Urology

## 2017-12-02 ENCOUNTER — Ambulatory Visit: Payer: Medicare HMO

## 2017-12-02 ENCOUNTER — Encounter

## 2017-12-02 ENCOUNTER — Encounter: Payer: Self-pay | Admitting: Podiatry

## 2017-12-02 VITALS — BP 148/68 | HR 80 | Ht 62.0 in | Wt 150.4 lb

## 2017-12-02 DIAGNOSIS — M79676 Pain in unspecified toe(s): Secondary | ICD-10-CM | POA: Diagnosis not present

## 2017-12-02 DIAGNOSIS — B351 Tinea unguium: Secondary | ICD-10-CM

## 2017-12-02 DIAGNOSIS — N3289 Other specified disorders of bladder: Secondary | ICD-10-CM | POA: Diagnosis not present

## 2017-12-02 DIAGNOSIS — L97522 Non-pressure chronic ulcer of other part of left foot with fat layer exposed: Secondary | ICD-10-CM

## 2017-12-02 LAB — URINALYSIS, COMPLETE
BILIRUBIN UA: NEGATIVE
GLUCOSE, UA: NEGATIVE
KETONES UA: NEGATIVE
LEUKOCYTES UA: NEGATIVE
Nitrite, UA: NEGATIVE
RBC, UA: NEGATIVE
Specific Gravity, UA: >1.03 — ABNORMAL HIGH (ref 1.005–1.030)
UUROB: 0.2 mg/dL (ref 0.2–1.0)
pH, UA: 5.5 (ref 5.0–7.5)

## 2017-12-02 LAB — MICROSCOPIC EXAMINATION
EPITHELIAL CELLS (NON RENAL): NONE SEEN /HPF (ref 0–10)
WBC, UA: NONE SEEN /hpf (ref 0–5)

## 2017-12-02 MED ORDER — LIDOCAINE HCL URETHRAL/MUCOSAL 2 % EX GEL
1.0000 | Freq: Once | CUTANEOUS | Status: AC
Start: 2017-12-02 — End: 2017-12-02
  Administered 2017-12-02: 1 via URETHRAL

## 2017-12-02 MED ORDER — CIPROFLOXACIN HCL 500 MG PO TABS
500.0000 mg | ORAL_TABLET | Freq: Once | ORAL | Status: AC
  Administered 2017-12-02: 500 mg via ORAL

## 2017-12-02 NOTE — Progress Notes (Signed)
   12/02/17  CC:  Chief Complaint  Patient presents with  . Cysto    HPI: [facility]ewSaw [facility] for recurrent UTIs.  A renal ultrasound showed calcification at the bladder neck.  Urinalysis today shows no evidence of infection.  Blood pressure (!) 148/68, pulse 80, height 5\' 2"  (1.575 m), weight 150 lb 6.4 oz (68.2 kg). NED. A&Ox3.    Cystoscopy Procedure Note  Patient identification was confirmed, informed consent was obtained, and patient was prepped using Betadine solution.  Lidocaine jelly was administered per urethral meatus.    Preoperative abx where received prior to procedure.     Pre-Procedure: - Inspection reveals a normal caliber ureteral meatus.  Procedure: The flexible cystoscope was introduced without difficulty - No urethral strictures/lesions are present. - Moderate enlargement prostate with prostatic calculi - Mild bladder neck elevation - Bilateral ureteral orifices identified - Bladder mucosa  reveals no ulcers, tumors, or lesions - No bladder stones -Moderate trabeculation  Retroflexion shows no intravesical median lobe, mass, tumor or calculi   Post-Procedure: - Patient tolerated the procedure well  Assessment/ Plan: Moderate prostate enlargement on cystoscopy otherwise normal.  Will have him follow-up with Carollee HerterShannon in approximately 6 weeks for repeat UA and symptom recheck.

## 2017-12-03 ENCOUNTER — Encounter: Payer: Self-pay | Admitting: *Deleted

## 2017-12-04 NOTE — Progress Notes (Signed)
   SUBJECTIVE Patient presents to office today complaining of elongated, thickened nails that cause pain while ambulating in shoes. He is unable to trim his own nails. Patient is here for further evaluation and treatment.  Past Medical History:  Diagnosis Date  . Arthritis   . CKD (chronic kidney disease) stage 3, GFR 30-59 ml/min (HCC) 12/29/2015  . Dry gangrene (HCC) 06/16/2017   Great toe and 5th toe LEFT foot; Dr. Wyn Quakerew  . Hyperlipidemia   . Hypertension   . Left leg DVT (HCC) 01/2014  . Myocardial infarction (HCC)   . Pulmonary embolism (HCC) 01/2014  . Thoracic aorta atherosclerosis (HCC) 10/08/2016    OBJECTIVE General Patient is awake, alert, and oriented x 3 and in no acute distress. Derm Skin is dry and supple bilateral. Negative open lesions or macerations. Remaining integument unremarkable. Nails are tender, long, thickened and dystrophic with subungual debris, consistent with onychomycosis, 1-5 bilateral. No signs of infection noted. Vasc  DP and PT pedal pulses palpable bilaterally. Temperature gradient within normal limits.  Neuro Epicritic and protective threshold sensation grossly intact bilaterally.  Musculoskeletal Exam No symptomatic pedal deformities noted bilateral. Muscular strength within normal limits.  ASSESSMENT 1. Onychodystrophic nails 1-5 bilateral with hyperkeratosis of nails.  2. Onychomycosis of nail due to dermatophyte bilateral 3. Pain in foot bilateral  PLAN OF CARE 1. Patient evaluated today.  2. Instructed to maintain good pedal hygiene and foot care.  3. Mechanical debridement of nails 1-5 bilaterally performed using a nail nipper. Filed with dremel without incident.  4. Return to clinic in 3 mos.    Felecia ShellingBrent M. Theador Jezewski, DPM [facility]  Dr. Felecia ShellingBrent M. Kallen Mccrystal, DPM    [address]                                        [place], [place] [id number]                Office [phone]  Fax [phone]

## 2017-12-05 ENCOUNTER — Other Ambulatory Visit (INDEPENDENT_AMBULATORY_CARE_PROVIDER_SITE_OTHER): Payer: Self-pay

## 2017-12-05 DIAGNOSIS — I739 Peripheral vascular disease, unspecified: Secondary | ICD-10-CM

## 2017-12-10 ENCOUNTER — Encounter (INDEPENDENT_AMBULATORY_CARE_PROVIDER_SITE_OTHER): Payer: Medicare HMO

## 2017-12-10 ENCOUNTER — Ambulatory Visit (INDEPENDENT_AMBULATORY_CARE_PROVIDER_SITE_OTHER): Payer: Medicare HMO | Admitting: Vascular Surgery

## 2017-12-23 ENCOUNTER — Other Ambulatory Visit (INDEPENDENT_AMBULATORY_CARE_PROVIDER_SITE_OTHER): Payer: Self-pay | Admitting: Vascular Surgery

## 2017-12-23 ENCOUNTER — Ambulatory Visit
Admission: RE | Admit: 2017-12-23 | Discharge: 2017-12-23 | Disposition: A | Discharge status: Home / Self Care | Payer: Medicare HMO | Source: Ambulatory Visit | Attending: Vascular Surgery | Admitting: Vascular Surgery

## 2017-12-23 DIAGNOSIS — I739 Peripheral vascular disease, unspecified: Secondary | ICD-10-CM

## 2017-12-23 DIAGNOSIS — I70202 Unspecified atherosclerosis of native arteries of extremities, left leg: Secondary | ICD-10-CM | POA: Insufficient documentation

## 2017-12-26 ENCOUNTER — Other Ambulatory Visit: Payer: Self-pay | Admitting: Family Medicine

## 2017-12-27 NOTE — Telephone Encounter (Signed)
Rosuvastatin requested Our med list shows that he is on atorvastatin Rosuvastatin denied

## 2018-01-08 ENCOUNTER — Ambulatory Visit (INDEPENDENT_AMBULATORY_CARE_PROVIDER_SITE_OTHER): Payer: Medicare HMO | Admitting: General Surgery

## 2018-01-08 ENCOUNTER — Encounter: Payer: Self-pay | Admitting: General Surgery

## 2018-01-08 VITALS — BP 122/60 | HR 67 | Resp 16 | Ht 62.0 in | Wt 152.0 lb

## 2018-01-08 DIAGNOSIS — Z8719 Personal history of other diseases of the digestive system: Secondary | ICD-10-CM

## 2018-01-08 NOTE — Progress Notes (Signed)
Patient ID: [name] [name], male   DOB: [birth year], 84 y.o.   MRN: [id number]  Chief Complaint  Patient presents with  . Other    HPI [name] [name] is a 84 y.o. male.  Here for evaluation of rectal bleeding referred by Dr Sherie Don. Patient denies rectal bleeding. Bowels move 1-2 times a day. Resident of [facility] for 4 years.  Daughter, Charna Elizabeth, admits to some memory changes. Neurology appointment pending. Denies rectal bleeding or pain.  HPI  Past Medical History:  Diagnosis Date  . Arthritis   . CKD (chronic kidney disease) stage 3, GFR 30-59 ml/min (HCC) 12/29/2015  . Dry gangrene (HCC) 06/16/2017   Great toe and 5th toe LEFT foot; Dr. Wyn Quaker  . Hyperlipidemia   . Hypertension   . Left leg DVT (HCC) 01/2014  . Myocardial infarction (HCC)   . Pulmonary embolism (HCC) 01/2014  . Thoracic aorta atherosclerosis (HCC) 10/08/2016    Past Surgical History:  Procedure Laterality Date  . COLONOSCOPY    . CORONARY STENT PLACEMENT  2006  . JOINT REPLACEMENT  2011 & 2012   both hips  . len replaced Left 2015  . LOWER EXTREMITY ANGIOGRAPHY N/A 05/26/2017   Procedure: Lower Extremity Angiography;  Surgeon: Annice Needy, MD;  Location: [facility] INVASIVE CV LAB;  Service: Cardiovascular;  Laterality: N/A;  . LOWER EXTREMITY ANGIOGRAPHY Left 11/10/2017   Procedure: LOWER EXTREMITY ANGIOGRAPHY;  Surgeon: Annice Needy, MD;  Location: [facility] INVASIVE CV LAB;  Service: Cardiovascular;  Laterality: Left;  Marland Kitchen MELANOMA EXCISION     40 years ago    Family History  Problem Relation Age of Onset  . Arthritis Mother   . Arthritis Father   . Depression Father   . Hypertension Daughter   . Pulmonary Hypertension Daughter     Social History Social History   Tobacco Use  . Smoking status: Former Smoker    Types: Pipe    Last attempt to quit: 1970    Years since quitting: 49.5  . Smokeless tobacco: Never Used  Substance Use Topics  . Alcohol use: No    Alcohol/week: 0.0 oz  . Drug  use: No    Allergies  Allergen Reactions  . Aspirin Anaphylaxis, Swelling and Shortness Of Breath    REACTION: terrible bp reaction/wheezing  . Bee Pollen Other (See Comments)    ragweed  . Pollen Extract     ragweed  . Rabbit Epithelium     rabbits    Current Outpatient Medications  Medication Sig Dispense Refill  . albuterol (PROVENTIL HFA;VENTOLIN HFA) 108 (90 Base) MCG/ACT inhaler Inhale 2 puffs into the lungs every 4 (four) hours as needed for wheezing or shortness of breath. 1 Inhaler 1  . apixaban (ELIQUIS) 5 MG TABS tablet Take 2 tablets (10 mg total) by mouth 2 (two) times daily. Till 06/02/17 And then 5 mg bid from 06/03/17 60 tablet 5  . atenolol (TENORMIN) 50 MG tablet TAKE 1 TABLET (50 MG TOTAL) BY MOUTH DAILY. 90 tablet 1  . atorvastatin (LIPITOR) 10 MG tablet TAKE 1 TABLET BY MOUTH EVERYDAY AT BEDTIME 30 tablet 5  . chlorhexidine (HIBICLENS) 4 % external liquid Apply topically daily as needed. 120 mL 0  . Cholecalciferol (VITAMIN D-1000 MAX ST) 1000 units tablet Take 5 tablets by mouth daily.    Marland Kitchen donepezil (ARICEPT) 5 MG tablet Take 1 tablet by mouth every evening.  6  . gentamicin cream (GARAMYCIN) 0.1 % Apply 1 application topically 3 (  three) times daily. 30 g 1  . loratadine (CLARITIN) 10 MG tablet Take 1 tablet (10 mg total) by mouth daily as needed for allergies.    . Omega-3 Fatty Acids (FISH OIL PO) Take 1 capsule by mouth 2 (two) times daily.    Marland Kitchen. sulfamethoxazole-trimethoprim (BACTRIM DS,SEPTRA DS) 800-160 MG tablet Take 1 tablet by mouth 2 (two) times daily. 14 tablet 0  . vitamin B-12 (CYANOCOBALAMIN) 1000 MCG tablet Take 1 tablet by mouth daily.     No current facility-administered medications for this visit.     Review of Systems Review of Systems  Constitutional: Positive for chills.  Respiratory: Positive for shortness of breath.   Gastrointestinal: Negative for abdominal pain, constipation and diarrhea.  Neurological: Positive for headaches.     Blood pressure 122/60, pulse 67, resp. rate 16, height 5\' 2"  (1.575 m), weight 152 lb (68.9 kg).  Physical Exam Physical Exam  Constitutional: He appears well-developed and well-nourished.  Neurological: He is alert.  Skin: Skin is warm and dry.  Psychiatric: His behavior is normal.       Assessment    History of possible rectal bleeding.    Plan    Patient declined examination.   Follow up as needed. The patient is aware to call back for any questions or concerns.      HPI, Physical Exam, Assessment and Plan have been scribed under the direction and in the presence of Earline MayotteJeffrey W. Byrnett, MD. Dorathy DaftMarsha Hatch, RN  I have completed the exam and reviewed the above documentation for accuracy and completeness.  I agree with the above.  [employer] has been used and any errors in dictation or transcription are unintentional.  Donnalee CurryJeffrey Byrnett, M.D., F.A.C.S.   Merrily PewJeffrey W Byrnett 01/09/2018, 6:17 AM

## 2018-01-08 NOTE — Patient Instructions (Signed)
The patient is aware to call back for any questions or concerns.  

## 2018-01-09 DIAGNOSIS — Z8719 Personal history of other diseases of the digestive system: Secondary | ICD-10-CM | POA: Insufficient documentation

## 2018-01-14 NOTE — Progress Notes (Signed)
01/15/2018 12:15 PM   [name] [name] 09-16-1927 [id number]  Referring provider: Kerman Passey, [name] [address] [place], [place] [address]  Chief Complaint  Patient presents with  . Follow-up    HPI: Patient is a 84 year old Caucasian male with a history of MRSA UTI and nocturia who presents today for follow up.   Background history Patient is a 84 year old Caucasian male who is referred to [place] by [name] [name] for MRSA UTI with his daughter, [name] [name].   He does not have a history of recurrent UTI's.  His symptoms with a urinary tract infection consist of increase of nocturia.  He denies dysuria, gross hematuria, suprapubic pain, back pain, abdominal pain or flank pain associated with UTI's.  He has not had any recent fevers, chills, nausea or vomiting associated with UTI's.   He is currently experiencing frequency, mild urgency and nocturia x3  for the last 6 months.  Patient denies any gross hematuria, dysuria or suprapubic/flank pain.  Patient denies any fevers, chills, nausea or vomiting.  He does not have a history of nephrolithiasis, GU surgery or GU trauma.  He denies constipation and/or diarrhea.   He does not engage in good perineal hygiene.  He does not take tub baths.  He does not have incontinence.  CT angiogram in 05/2017 noted no definite adrenal nodule. Mild thinning of renal parenchyma without hydronephrosis. Homogeneous renal enhancement. Urinary bladder is physiologically distended.    RUS on 11/03/2017 noted mild renal parenchyma thinning and minimal increased echogenicity bilaterally may reflect changes of medical renal disease. No hydronephrosis.  1 x 1.3 x 0.4 cm echogenic structure base of the bladder possibly related to prostate gland. Primary bladder abnormality not excluded.  Cystoscopy on 12/02/2017 with [name] [name] was negative.  Today, he is not having any urinary symptoms   Patient denies any gross hematuria, dysuria or suprapubic/flank pain.  Patient  denies any fevers, chills, nausea or vomiting.  His UA is negative.    PMH: Past Medical History:  Diagnosis Date  . Arthritis   . CKD (chronic kidney disease) stage 3, GFR 30-59 ml/min (HCC) 12/29/2015  . Dry gangrene (HCC) 06/16/2017   Great toe and 5th toe LEFT foot; Dr. Wyn [name]  . Hyperlipidemia   . Hypertension   . Left leg DVT (HCC) 01/2014  . Myocardial infarction (HCC)   . Pulmonary embolism (HCC) 01/2014  . Thoracic aorta atherosclerosis (HCC) 10/08/2016    Surgical History: Past Surgical History:  Procedure Laterality Date  . COLONOSCOPY    . CORONARY STENT PLACEMENT  2006  . JOINT REPLACEMENT  2011 & 2012   both hips  . len replaced Left 2015  . LOWER EXTREMITY ANGIOGRAPHY N/A 05/26/2017   Procedure: Lower Extremity Angiography;  Surgeon: [name] [name];  Location: [facility] INVASIVE CV LAB;  Service: Cardiovascular;  Laterality: N/A;  . LOWER EXTREMITY ANGIOGRAPHY Left 11/10/2017   Procedure: LOWER EXTREMITY ANGIOGRAPHY;  Surgeon: [name] [name];  Location: [facility] IN[facility];  Service: Cardiovascular;  Laterality: Left;  Marland Kitchen MELANOMA EXCISION     40 years ago    Home Medications:  Allergies as of 01/15/2018      Reactions   Aspirin Anaphylaxis, Swelling, Shortness Of Breath   REACTION: terrible bp reaction/wheezing   Bee Pollen Other (See Comments)   ragweed   Pollen Extract    ragweed   Rabbit Epithelium    rabbits      Medication List  Accurate as of 01/15/18 12:15 PM. Always use your most recent med list.          albuterol 108 (90 Base) MCG/ACT inhaler Commonly known as:  PROVENTIL HFA;VENTOLIN HFA Inhale 2 puffs into the lungs every 4 (four) hours as needed for wheezing or shortness of breath.   apixaban 5 MG Tabs tablet Commonly known as:  ELIQUIS Take 2 tablets (10 mg total) by mouth 2 (two) times daily. Till 06/02/17 And then 5 mg bid from 06/03/17   atenolol 50 MG tablet Commonly known as:  TENORMIN TAKE 1 TABLET (50 MG TOTAL) BY MOUTH  DAILY.   atorvastatin 10 MG tablet Commonly known as:  LIPITOR TAKE 1 TABLET BY MOUTH EVERYDAY AT BEDTIME   chlorhexidine 4 % external liquid Commonly known as:  HIBICLENS Apply topically daily as needed.   CLARITIN 10 MG tablet Generic drug:  loratadine Take 1 tablet (10 mg total) by mouth daily as needed for allergies.   donepezil 5 MG tablet Commonly known as:  ARICEPT Take 1 tablet by mouth every evening.   FISH OIL PO Take 1 capsule by mouth 2 (two) times daily.   gentamicin cream 0.1 % Commonly known as:  GARAMYCIN Apply 1 application topically 3 (three) times daily.   sulfamethoxazole-trimethoprim 800-160 MG tablet Commonly known as:  BACTRIM DS,SEPTRA DS Take 1 tablet by mouth 2 (two) times daily.   vitamin B-12 1000 MCG tablet Commonly known as:  CYANOCOBALAMIN Take 1 tablet by mouth daily.   VITAMIN D-1000 MAX ST 1000 units tablet Generic drug:  Cholecalciferol Take 5 tablets by mouth daily.       Allergies:  Allergies  Allergen Reactions  . Aspirin Anaphylaxis, Swelling and Shortness Of Breath    REACTION: terrible bp reaction/wheezing  . Bee Pollen Other (See Comments)    ragweed  . Pollen Extract     ragweed  . Rabbit Epithelium     rabbits    Family History: Family History  Problem Relation Age of Onset  . Arthritis Mother   . Arthritis Father   . Depression Father   . Hypertension Daughter   . Pulmonary Hypertension Daughter     Social History:  reports that he quit smoking about 49 years ago. His smoking use included pipe. He has never used smokeless tobacco. He reports that he does not drink alcohol or use drugs.  ROS: UROLOGY Frequent Urination?: No Hard to postpone urination?: Yes Burning/pain with urination?: No Get up at night to urinate?: Yes Leakage of urine?: No Urine stream starts and stops?: No Trouble starting stream?: No Do you have to strain to urinate?: No Blood in urine?: No Urinary tract infection?:  No Sexually transmitted disease?: No Injury to kidneys or bladder?: No Painful intercourse?: No Weak stream?: No Erection problems?: No Penile pain?: No  Gastrointestinal Nausea?: No Vomiting?: No Indigestion/heartburn?: No Diarrhea?: No Constipation?: No  Constitutional Fever: No Night sweats?: Yes Weight loss?: No Fatigue?: Yes  Skin Skin rash/lesions?: No Itching?: No  Eyes Blurred vision?: No Double vision?: No  Ears/Nose/Throat Sore throat?: No Sinus problems?: Yes  Hematologic/Lymphatic Swollen glands?: No Easy bruising?: No  Cardiovascular Leg swelling?: No Chest pain?: No  Respiratory Cough?: Yes Shortness of breath?: Yes  Endocrine Excessive thirst?: No  Musculoskeletal Back pain?: No Joint pain?: No  Neurological Headaches?: No Dizziness?: No  Psychologic Depression?: Yes Anxiety?: Yes  Physical Exam: BP 133/74   Pulse 64   Wt 150 lb (68 kg)   BMI 27.44  kg/m   Constitutional: Well nourished. Alert and oriented, No acute distress. HEENT: [place] AT, moist mucus membranes. Trachea midline, no masses. Cardiovascular: No clubbing, cyanosis, or edema. Respiratory: Normal respiratory effort, no increased work of breathing. Skin: No rashes, bruises or suspicious lesions. Lymph: No cervical or inguinal adenopathy. Neurologic: Grossly intact, no focal deficits, moving all 4 extremities. Psychiatric: Normal mood and affect.    Laboratory Data: Lab Results  Component Value Date   WBC 8.9 09/19/2017   HGB 13.0 (L) 09/19/2017   HCT 41.3 09/19/2017   MCV 84.5 09/19/2017   PLT 233 09/19/2017    Lab Results  Component Value Date   CREATININE 1.27 (H) 11/07/2017    No results found for: PSA  No results found for: TESTOSTERONE  No results found for: HGBA1C  Lab Results  Component Value Date   TSH 3.047 06/20/2017       Component Value Date/Time   CHOL 116 09/19/2017 1010   CHOL 179 03/13/2015 0906   HDL 36 (L) 09/19/2017  1010   HDL 26 (L) 03/13/2015 0906   CHOLHDL 3.2 09/19/2017 1010   VLDL 51 (H) 10/08/2016 1131   LDLCALC 61 09/19/2017 1010    Lab Results  Component Value Date   AST 16 09/19/2017   Lab Results  Component Value Date   ALT 10 09/19/2017   No components found for: ALKALINEPHOPHATASE No components found for: BILIRUBINTOTAL  No results found for: ESTRADIOL  Urinalysis See Epic and HPI  I have reviewed the labs.    Assessment & Plan:    1. MRSA UTI RUS did not identify an nidus for infection Continue to pull back the foreskin and clean the penis with Hibiclens twice daily   2. Blood from rectum Dr [name] [name] aware and is being evaluated  3. Nocturia  Patient states he is only getting up once a night now  4. Bladder mass Cystoscopy was negative   Return in about 1 year (around 01/16/2019) for IPSS and PVR.  These notes generated with voice recognition software. I apologize for typographical errors.  Michiel Cowboy, PA-C  [facility] [address] [place], [place] [address] [phone]

## 2018-01-15 ENCOUNTER — Ambulatory Visit: Payer: Medicare HMO | Admitting: Urology

## 2018-01-15 ENCOUNTER — Encounter: Payer: Self-pay | Admitting: Urology

## 2018-01-15 VITALS — BP 133/74 | HR 64 | Wt 150.0 lb

## 2018-01-15 DIAGNOSIS — R351 Nocturia: Secondary | ICD-10-CM

## 2018-01-15 DIAGNOSIS — Z22322 Carrier or suspected carrier of Methicillin resistant Staphylococcus aureus: Secondary | ICD-10-CM

## 2018-01-15 LAB — MICROSCOPIC EXAMINATION
EPITHELIAL CELLS (NON RENAL): NONE SEEN /HPF (ref 0–10)
WBC UA: NONE SEEN /HPF (ref 0–5)

## 2018-01-15 LAB — URINALYSIS, COMPLETE
BILIRUBIN UA: NEGATIVE
Glucose, UA: NEGATIVE
Ketones, UA: NEGATIVE
LEUKOCYTES UA: NEGATIVE
Nitrite, UA: NEGATIVE
PH UA: 6 (ref 5.0–7.5)
RBC UA: NEGATIVE
Specific Gravity, UA: 1.025 (ref 1.005–1.030)
Urobilinogen, Ur: 0.2 mg/dL (ref 0.2–1.0)

## 2018-01-19 ENCOUNTER — Ambulatory Visit (INDEPENDENT_AMBULATORY_CARE_PROVIDER_SITE_OTHER): Payer: Medicare HMO | Admitting: Family Medicine

## 2018-01-19 ENCOUNTER — Encounter: Payer: Self-pay | Admitting: Family Medicine

## 2018-01-19 DIAGNOSIS — N183 Chronic kidney disease, stage 3 unspecified: Secondary | ICD-10-CM

## 2018-01-19 DIAGNOSIS — I1310 Hypertensive heart and chronic kidney disease without heart failure, with stage 1 through stage 4 chronic kidney disease, or unspecified chronic kidney disease: Secondary | ICD-10-CM

## 2018-01-19 DIAGNOSIS — I96 Gangrene, not elsewhere classified: Secondary | ICD-10-CM | POA: Diagnosis not present

## 2018-01-19 DIAGNOSIS — Z86718 Personal history of other venous thrombosis and embolism: Secondary | ICD-10-CM | POA: Diagnosis not present

## 2018-01-19 DIAGNOSIS — E782 Mixed hyperlipidemia: Secondary | ICD-10-CM

## 2018-01-19 DIAGNOSIS — I10 Essential (primary) hypertension: Secondary | ICD-10-CM | POA: Diagnosis not present

## 2018-01-19 DIAGNOSIS — I2510 Atherosclerotic heart disease of native coronary artery without angina pectoris: Secondary | ICD-10-CM

## 2018-01-19 NOTE — Assessment & Plan Note (Signed)
Seen by vascular and now podiatry; patient declined exam by me today

## 2018-01-19 NOTE — Assessment & Plan Note (Signed)
Monitor lipids periodically; last LDL excellent

## 2018-01-19 NOTE — Progress Notes (Signed)
BP 136/82   Pulse 74   Temp 97.8 F (36.6 C) (Oral)   Resp 12   Ht 5\' 2"  (1.575 m)   Wt 150 lb 8 oz (68.3 kg)   SpO2 99%   BMI 27.53 kg/m    Subjective:    Patient ID: [name] [name], male    DOB: 18-Aug-1927, 84 y.o.   MRN: [id number]  HPI: [name] [name] is a 84 y.o. male  Chief Complaint  Patient presents with  . Follow-up    HPI  He went to see the specialist about rectal bleeding and they dismissed him; he is not having any bleeding; they did not check him because patient did not want it done; daughter says they even refunded his co-pay; I asked him again if he is having any bleeding from rectum and he says "no, none"  Breathing is "not too bad"; staying inside mostly  Hypertension; not adding much salt to his food  High cholesterol; he might eat red meat 2-3 x a week; not many fried foods; likes cheese; eats eggs 1-2 x a week; cholesterol panel much better  Stage 3 CKD; no NSAIDs; not much water intake; coffee and soda; then water with pills  Urinating at night; used to see Michiel CowboyShannon McGowan, urologist  On Eliquis; no bleeding from anywhere; multiple DVTs; goes back to specialist soon  Coronary artery disease; no chest pain  No recent kidney stones, run in the family  He is seeing podiatrist about his toes; sees him in 10 days  Depression screen [facility] 2/[facility] 01/19/2018 09/19/2017 06/19/2017 06/16/2017 03/24/2017  Decreased Interest 0 0 0 0 0  Down, Depressed, Hopeless 0 0 0 0 0  PHQ - 2 Score 0 0 0 0 0    Relevant past medical, surgical, family and social history reviewed Past Medical History:  Diagnosis Date  . Arthritis   . CKD (chronic kidney disease) stage 3, GFR 30-59 ml/min (HCC) 12/29/2015  . Dry gangrene (HCC) 06/16/2017   Great toe and 5th toe LEFT foot; Dr. Wyn Quakerew  . Hyperlipidemia   . Hypertension   . Left leg DVT (HCC) 01/2014  . Myocardial infarction (HCC)   . Pulmonary embolism (HCC) 01/2014  . Thoracic aorta atherosclerosis (HCC) 10/08/2016    Past Surgical History:  Procedure Laterality Date  . COLONOSCOPY    . CORONARY STENT PLACEMENT  2006  . JOINT REPLACEMENT  2011 & 2012   both hips  . len replaced Left 2015  . LOWER EXTREMITY ANGIOGRAPHY N/A 05/26/2017   Procedure: Lower Extremity Angiography;  Surgeon: Annice Needyew, Jason S, MD;  Location: [facility] INVASIVE [facility];  Service: Cardiovascular;  Laterality: N/A;  . LOWER EXTREMITY ANGIOGRAPHY Left 11/10/2017   Procedure: LOWER EXTREMITY ANGIOGRAPHY;  Surgeon: Annice Needyew, Jason S, MD;  Location: [facility] INV[facility];  Service: Cardiovascular;  Laterality: Left;  Marland Kitchen. MELANOMA EXCISION     40 years ago   Family History  Problem Relation Age of Onset  . Arthritis Mother   . Arthritis Father   . Depression Father   . Hypertension Daughter   . Pulmonary Hypertension Daughter    Social History   Tobacco Use  . Smoking status: Former Smoker    Types: Pipe    Last attempt to quit: 1970    Years since quitting: 49.5  . Smokeless tobacco: Never Used  Substance Use Topics  . Alcohol use: No    Alcohol/week: 0.0 oz  . Drug use: No    Interim  medical history since last visit reviewed. Allergies and medications reviewed  Review of Systems Per HPI unless specifically indicated above     Objective:    BP 136/82   Pulse 74   Temp 97.8 F (36.6 C) (Oral)   Resp 12   Ht 5\' 2"  (1.575 m)   Wt 150 lb 8 oz (68.3 kg)   SpO2 99%   BMI 27.53 kg/m   Wt Readings from Last 3 Encounters:  01/19/18 150 lb 8 oz (68.3 kg)  01/15/18 150 lb (68 kg)  01/08/18 152 lb (68.9 kg)    Physical Exam  Constitutional: He appears well-developed and well-nourished. No distress.  Elderly gentleman, no distress  HENT:  Head: Normocephalic and atraumatic.  Eyes: EOM are normal. No scleral icterus.  Neck: No thyromegaly present.  Cardiovascular: Normal rate and regular rhythm.  Pulmonary/Chest: Effort normal and breath sounds normal.  Abdominal: Soft. Bowel sounds are normal. He exhibits no distension.   Musculoskeletal: He exhibits no edema.  Neurological: Coordination normal.  Ambulatory with walker  Skin: Skin is warm and dry. No pallor.  Psychiatric: He has a normal mood and affect. His behavior is normal. Judgment and thought content normal. His mood appears not anxious. He does not exhibit a depressed mood.      Assessment & Plan:   Problem List Items Addressed This Visit      Cardiovascular and Mediastinum   Hypertensive heart/kidney disease without HF and with CKD stage III (HCC)    Controlled today; continue meds      Essential hypertension    Well-controlled today' try to follow DASH guidelines      CAD (coronary artery disease), native coronary artery    Aware of reasons to call 911; continue meds        Genitourinary   CKD (chronic kidney disease) stage 3, GFR 30-59 ml/min (HCC)    Try to drink more water; avoid NSAIDs        Other   Personal history of DVT (deep vein thrombosis)    Multiple clots; continue Eliquis      Mixed hyperlipidemia    Monitor lipids periodically; last LDL excellent      Dry gangrene (HCC)    Seen by vascular and now podiatry; patient declined exam by me today          Follow up plan: Return in about 2 months (around 03/23/2018) for follow-up visit with Dr. Sherie Don.  An after-visit summary was printed and given to the patient at check-out.  Please see the patient instructions which may contain other information and recommendations beyond what is mentioned above in the assessment and plan.  No orders of the defined types were placed in this encounter.   No orders of the defined types were placed in this encounter.

## 2018-01-19 NOTE — Assessment & Plan Note (Signed)
Try to drink more water; avoid NSAIDs

## 2018-01-19 NOTE — Assessment & Plan Note (Signed)
Multiple clots; continue Eliquis

## 2018-01-19 NOTE — Patient Instructions (Addendum)
Try to increase your water intake Consider saw palmetto for your prostate

## 2018-01-19 NOTE — Assessment & Plan Note (Signed)
Aware of reasons to call 911; continue meds

## 2018-01-19 NOTE — Assessment & Plan Note (Signed)
Well-controlled today; try to follow DASH guidelines 

## 2018-01-19 NOTE — Assessment & Plan Note (Signed)
Controlled today; continue meds 

## 2018-02-07 ENCOUNTER — Other Ambulatory Visit: Payer: Self-pay | Admitting: Family Medicine

## 2018-02-09 NOTE — Telephone Encounter (Signed)
rx request for statin; too soon; 6 month supply sent in April

## 2018-02-10 DIAGNOSIS — M170 Bilateral primary osteoarthritis of knee: Secondary | ICD-10-CM | POA: Diagnosis not present

## 2018-02-11 DIAGNOSIS — F39 Unspecified mood [affective] disorder: Secondary | ICD-10-CM | POA: Diagnosis not present

## 2018-02-11 DIAGNOSIS — F0390 Unspecified dementia without behavioral disturbance: Secondary | ICD-10-CM | POA: Diagnosis not present

## 2018-02-11 DIAGNOSIS — E538 Deficiency of other specified B group vitamins: Secondary | ICD-10-CM | POA: Diagnosis not present

## 2018-03-05 ENCOUNTER — Encounter: Payer: Self-pay | Admitting: Podiatry

## 2018-03-05 ENCOUNTER — Ambulatory Visit: Payer: Medicare HMO | Admitting: Podiatry

## 2018-03-05 DIAGNOSIS — D689 Coagulation defect, unspecified: Secondary | ICD-10-CM

## 2018-03-05 DIAGNOSIS — M79676 Pain in unspecified toe(s): Secondary | ICD-10-CM

## 2018-03-05 DIAGNOSIS — B351 Tinea unguium: Secondary | ICD-10-CM

## 2018-03-05 NOTE — Progress Notes (Signed)
Complaint:  Visit Type: Patient returns to my office for continued preventative foot care services. Complaint: Patient states" my nails have grown long and thick and become painful to walk and wear shoes" Patient has been diagnosed with  PVD and dementia. The patient presents for preventative foot care services. No changes to ROS  Podiatric Exam: Vascular: dorsalis pedis and posterior tibial pulses are palpable bilateral. Capillary return is immediate. Temperature gradient is WNL. Skin turgor WNL  Sensorium: Normal Semmes Weinstein monofilament test. Normal tactile sensation bilaterally. Nail Exam: Pt has thick disfigured discolored nails with subungual debris noted bilateral entire nail hallux through fifth toenails Ulcer Exam: There is no evidence of ulcer or pre-ulcerative changes or infection. Orthopedic Exam: Muscle tone and strength are WNL. No limitations in general ROM. No crepitus or effusions noted. Foot type and digits show no abnormalities. Bony prominences are unremarkable. Skin: No Porokeratosis. No infection or ulcers  Diagnosis:  Onychomycosis, , Pain in right toe, pain in left toes  Treatment & Plan Procedures and Treatment: Consent by patient was obtained for treatment procedures.   Debridement of mycotic and hypertrophic toenails, 1 through 5 bilateral and clearing of subungual debris. No ulceration, no infection noted.  Return Visit-Office Procedure: Patient instructed to return to the office for a follow up visit 3 months for continued evaluation and treatment.    Helane Gunther DPM

## 2018-03-06 ENCOUNTER — Encounter (INDEPENDENT_AMBULATORY_CARE_PROVIDER_SITE_OTHER): Payer: Medicare HMO

## 2018-03-06 ENCOUNTER — Ambulatory Visit (INDEPENDENT_AMBULATORY_CARE_PROVIDER_SITE_OTHER): Payer: Medicare HMO | Admitting: Vascular Surgery

## 2018-03-10 ENCOUNTER — Encounter (INDEPENDENT_AMBULATORY_CARE_PROVIDER_SITE_OTHER): Payer: Self-pay | Admitting: Vascular Surgery

## 2018-03-10 ENCOUNTER — Other Ambulatory Visit (INDEPENDENT_AMBULATORY_CARE_PROVIDER_SITE_OTHER): Payer: Self-pay | Admitting: Vascular Surgery

## 2018-03-10 ENCOUNTER — Ambulatory Visit (INDEPENDENT_AMBULATORY_CARE_PROVIDER_SITE_OTHER): Payer: Medicare HMO

## 2018-03-10 ENCOUNTER — Ambulatory Visit (INDEPENDENT_AMBULATORY_CARE_PROVIDER_SITE_OTHER): Payer: Medicare HMO | Admitting: Vascular Surgery

## 2018-03-10 VITALS — BP 145/67 | HR 54 | Resp 13 | Ht 61.0 in | Wt 151.0 lb

## 2018-03-10 DIAGNOSIS — Z95820 Peripheral vascular angioplasty status with implants and grafts: Secondary | ICD-10-CM

## 2018-03-10 DIAGNOSIS — E782 Mixed hyperlipidemia: Secondary | ICD-10-CM | POA: Diagnosis not present

## 2018-03-10 DIAGNOSIS — N183 Chronic kidney disease, stage 3 unspecified: Secondary | ICD-10-CM

## 2018-03-10 DIAGNOSIS — I70249 Atherosclerosis of native arteries of left leg with ulceration of unspecified site: Secondary | ICD-10-CM

## 2018-03-10 DIAGNOSIS — I739 Peripheral vascular disease, unspecified: Secondary | ICD-10-CM

## 2018-03-10 DIAGNOSIS — I10 Essential (primary) hypertension: Secondary | ICD-10-CM | POA: Diagnosis not present

## 2018-03-10 NOTE — Patient Instructions (Signed)

## 2018-03-10 NOTE — Progress Notes (Signed)
MRN : [id number]  [name] [name] is a 84 y.o. ([birth year]) male who presents with chief complaint of  Chief Complaint  Patient presents with  . Follow-up    Venous duplex and ABI  .  History of Present Illness: Patient returns today in follow up of PAD.  He is currently doing well without any significant pain, ulceration, or infection of the lower extremities.  He says he is not walking as much as he should secondary to arthritis issues.  Duplex shows his left SFA stent/popliteal stent to be patent without evidence of stenosis.  There is strong monophasic signals more distally although his digital pressure on the left is reduced.  On the right the digital pressure is reasonably normal as is the waveform.  His ABIs are noncompressible bilaterally.  Current Outpatient Medications  Medication Sig Dispense Refill  . albuterol (PROVENTIL HFA;VENTOLIN HFA) 108 (90 Base) MCG/ACT inhaler Inhale 2 puffs into the lungs every 4 (four) hours as needed for wheezing or shortness of breath. 1 Inhaler 1  . apixaban (ELIQUIS) 5 MG TABS tablet Take 2 tablets (10 mg total) by mouth 2 (two) times daily. Till 06/02/17 And then 5 mg bid from 06/03/17 60 tablet 5  . atenolol (TENORMIN) 50 MG tablet TAKE 1 TABLET (50 MG TOTAL) BY MOUTH DAILY. 90 tablet 1  . atorvastatin (LIPITOR) 10 MG tablet TAKE 1 TABLET BY MOUTH EVERYDAY AT BEDTIME 30 tablet 5  . Cholecalciferol (VITAMIN D-1000 MAX ST) 1000 units tablet Take 5 tablets by mouth daily.    Marland Kitchen donepezil (ARICEPT) 5 MG tablet Take 1 tablet by mouth every evening.  6  . loratadine (CLARITIN) 10 MG tablet Take 1 tablet (10 mg total) by mouth daily as needed for allergies.    . Omega-3 Fatty Acids (FISH OIL PO) Take 1 capsule by mouth 2 (two) times daily.    . vitamin B-12 (CYANOCOBALAMIN) 1000 MCG tablet Take 1 tablet by mouth daily.     No current facility-administered medications for this visit.     Past Medical History:  Diagnosis Date  . Arthritis   . CKD  (chronic kidney disease) stage 3, GFR 30-59 ml/min (HCC) 12/29/2015  . Dry gangrene (HCC) 06/16/2017   Great toe and 5th toe LEFT foot; Dr. Wyn Quaker  . Hyperlipidemia   . Hypertension   . Left leg DVT (HCC) 01/2014  . Myocardial infarction (HCC)   . Pulmonary embolism (HCC) 01/2014  . Thoracic aorta atherosclerosis (HCC) 10/08/2016    Past Surgical History:  Procedure Laterality Date  . COLONOSCOPY    . CORONARY STENT PLACEMENT  2006  . JOINT REPLACEMENT  2011 & 2012   both hips  . len replaced Left 2015  . LOWER EXTREMITY ANGIOGRAPHY N/A 05/26/2017   Procedure: Lower Extremity Angiography;  Surgeon: Annice Needy, MD;  Location: [facility];  Service: Cardiovascular;  Laterality: N/A;  . LOWER EXTREMITY ANGIOGRAPHY Left 11/10/2017   Procedure: LOWER EXTREMITY ANGIOGRAPHY;  Surgeon: Annice Needy, MD;  Location: [facility];  Service: Cardiovascular;  Laterality: Left;  Marland Kitchen MELANOMA EXCISION     40 years ago    Social History Social History   Tobacco Use  . Smoking status: Former Smoker    Types: Pipe    Last attempt to quit: 1970    Years since quitting: 49.7  . Smokeless tobacco: Never Used  Substance Use Topics  . Alcohol use: No    Alcohol/week: 0.0 standard drinks  .  Drug use: No     Family History Family History  Problem Relation Age of Onset  . Arthritis Mother   . Arthritis Father   . Depression Father   . Hypertension Daughter   . Pulmonary Hypertension Daughter     Allergies  Allergen Reactions  . Aspirin Anaphylaxis, Swelling and Shortness Of Breath    REACTION: terrible bp reaction/wheezing  . Bee Pollen Other (See Comments)    ragweed  . Pollen Extract     ragweed  . Rabbit Epithelium     rabbits    REVIEW OF SYSTEMS(Negative unless checked)  Constitutional: [] Weight loss[] Fever[] Chills Cardiac:[] Chest pain[] Chest pressure[] Palpitations [] Shortness of breath when laying flat [] Shortness of breath at rest  [] Shortness of breath with exertion. Vascular: [] Pain in legs with walking[] Pain in legsat rest[] Pain in legs when laying flat [] Claudication [x] Pain in feet when walking [x] Pain in feet at rest [] Pain in feet when laying flat [] History of DVT [] Phlebitis [] Swelling in legs [] Varicose veins [x] Non-healing ulcers Pulmonary: [] Uses home oxygen [] Productive cough[] Hemoptysis [] Wheeze [] COPD [] Asthma Neurologic: [] Dizziness [] Blackouts [] Seizures [] History of stroke [] History of TIA[] Aphasia [] Temporary blindness[] Dysphagia [] Weaknessor numbness in arms [x] Weakness or numbnessin legs Musculoskeletal: [] Arthritis [] Joint swelling [] Joint pain [] Low back pain Hematologic:[] Easy bruising[] Easy bleeding [] Hypercoagulable state [] Anemic  Gastrointestinal:[] Blood in stool[] Vomiting blood[] Gastroesophageal reflux/heartburn[] Abdominal pain Genitourinary: [] Chronic kidney disease [] Difficulturination [] Frequenturination [] Burning with urination[] Hematuria Skin: [] Rashes [x] Ulcers [x] Wounds Psychological: [] History of anxiety[] History of major depression.   Physical Examination  BP (!) 145/67 (BP Location: Right Arm, Patient Position: Sitting)   Pulse (!) 54   Resp 13   Ht 5\' 1"  (1.549 m)   Wt 151 lb (68.5 kg)   BMI 28.53 kg/m  Gen:  WD/WN, NAD.  Appears younger than stated age Head: [place]/AT, No temporalis wasting. Ear/Nose/Throat: Hearing finished, nares w/o erythema or drainage Eyes: Conjunctiva clear. Sclera non-icteric Neck: Supple.  Trachea midline Pulmonary:  Good air movement, no use of accessory muscles.  Cardiac: RRR, no JVD Vascular:  Vessel Right Left  Radial Palpable Palpable                          PT  1+ palpable  1+ palpable  DP Palpable  1+ palpable    Musculoskeletal: M/S 5/5 throughout.  No deformity or atrophy.  Trace lower extremity edema. Neurologic: Sensation  grossly intact in extremities.  Symmetrical.  Speech is fluent.  Psychiatric: Judgment intact, Mood & affect appropriate for pt's clinical situation. Dermatologic: No rashes or ulcers noted.  No cellulitis or open wounds.       Labs Recent Results (from the past 2160 hour(s))  Urinalysis, Complete     Status: Abnormal   Collection Time: 01/15/18 11:22 AM  Result Value Ref Range   Specific Gravity, UA 1.025 1.005 - 1.030   pH, UA 6.0 5.0 - 7.5   Color, UA Yellow Yellow   Appearance Ur Clear Clear   Leukocytes, UA Negative Negative   Protein, UA 1+ (A) Negative/Trace   Glucose, UA Negative Negative   Ketones, UA Negative Negative   RBC, UA Negative Negative   Bilirubin, UA Negative Negative   Urobilinogen, Ur 0.2 0.2 - 1.0 mg/dL   Nitrite, UA Negative Negative   Microscopic Examination See below:   Microscopic Examination     Status: Abnormal   Collection Time: 01/15/18 11:22 AM  Result Value Ref Range   WBC, UA None seen 0 - 5 /hpf   RBC, UA 0-2 0 - 2 /hpf   Epithelial Cells (  non renal) None seen 0 - 10 /hpf   Casts Present (A) None seen /lpf   Cast Type Hyaline casts N/A   Mucus, UA Present (A) Not Estab.   Bacteria, UA Few (A) None seen/Few    Radiology No results found.  Assessment/Plan Hypertension goal BP (blood pressure) < 140/90 blood pressure control important in reducing the progression of atherosclerotic disease. On appropriate oral medications.   CKD (chronic kidney disease) stage 3, GFR 30-59 ml/min No apparent deterioration after procedure  Hyperlipidemia LDL goal <70 lipid control important in reducing the progression of atherosclerotic disease. Continue statin therapy  Peripheral vascular disease (HCC) Duplex shows his left SFA stent/popliteal stent to be patent without evidence of stenosis.  There is strong monophasic signals more distally although his digital pressure on the left is reduced.  On the right the digital pressure is reasonably  normal as is the waveform.  His ABIs are noncompressible bilaterally.  No current symptoms of concern.  We will plan to see him back in 6 months with ABIs.  I will be happy to see him sooner if any problems should develop in the interim    Festus Barren, MD  03/10/2018 4:16 PM    This note was created with Dragon medical transcription system.  Any errors from dictation are purely unintentional

## 2018-03-10 NOTE — Assessment & Plan Note (Signed)
Duplex shows his left SFA stent/popliteal stent to be patent without evidence of stenosis.  There is strong monophasic signals more distally although his digital pressure on the left is reduced.  On the right the digital pressure is reasonably normal as is the waveform.  His ABIs are noncompressible bilaterally.  No current symptoms of concern.  We will plan to see him back in 6 months with ABIs.  I will be happy to see him sooner if any problems should develop in the interim

## 2018-03-24 ENCOUNTER — Ambulatory Visit: Payer: Medicare HMO

## 2018-03-24 ENCOUNTER — Ambulatory Visit (INDEPENDENT_AMBULATORY_CARE_PROVIDER_SITE_OTHER): Payer: Medicare HMO | Admitting: Family Medicine

## 2018-03-24 ENCOUNTER — Encounter: Payer: Self-pay | Admitting: Family Medicine

## 2018-03-24 VITALS — BP 132/68 | HR 67 | Temp 98.1°F | Ht 61.0 in | Wt 154.0 lb

## 2018-03-24 DIAGNOSIS — I1310 Hypertensive heart and chronic kidney disease without heart failure, with stage 1 through stage 4 chronic kidney disease, or unspecified chronic kidney disease: Secondary | ICD-10-CM | POA: Diagnosis not present

## 2018-03-24 DIAGNOSIS — R293 Abnormal posture: Secondary | ICD-10-CM

## 2018-03-24 DIAGNOSIS — N183 Chronic kidney disease, stage 3 unspecified: Secondary | ICD-10-CM

## 2018-03-24 DIAGNOSIS — R413 Other amnesia: Secondary | ICD-10-CM

## 2018-03-24 DIAGNOSIS — J3489 Other specified disorders of nose and nasal sinuses: Secondary | ICD-10-CM

## 2018-03-24 DIAGNOSIS — Z23 Encounter for immunization: Secondary | ICD-10-CM

## 2018-03-24 DIAGNOSIS — R519 Headache, unspecified: Secondary | ICD-10-CM

## 2018-03-24 DIAGNOSIS — E782 Mixed hyperlipidemia: Secondary | ICD-10-CM

## 2018-03-24 DIAGNOSIS — R51 Headache: Secondary | ICD-10-CM

## 2018-03-24 NOTE — Assessment & Plan Note (Signed)
F/u with neurologist; continue med; daughter helpful

## 2018-03-24 NOTE — Assessment & Plan Note (Signed)
Chronic, controlled with medicine

## 2018-03-24 NOTE — Assessment & Plan Note (Signed)
Patient declined labs today; continue statin

## 2018-03-24 NOTE — Progress Notes (Signed)
BP 132/68   Pulse 67   Temp 98.1 F (36.7 C) (Oral)   Ht 5\' 1"  (1.549 m)   Wt 154 lb (69.9 kg)   SpO2 94%   BMI 29.10 kg/m    Subjective:    Patient ID: [name] [name], male    DOB: [birth year], 84 y.o.   MRN: [id number]  HPI: [name] [name] is a 84 y.o. male  Chief Complaint  Patient presents with  . Follow-up  . Wheezing    chest lungs  . headches    HPI He is here for follow-up  HTN; not checking at home; controlled today  High cholesterol; on statin; meals on wheels, pretty well-regulated he says; daughter buys them frozen foods like microwave dinners for safety so his wife doesn't have to cook; they do like their ice cream; using lactose-some kind of ice cream; likes fresh fruits; better last check in March; patient politely declined labs today  He has been wheezing; wife has noticed that but patient does not notice it; he does not exert himself; he is not symptomatic  He has headaches; he drinks a cup of coffee every morning; makes his nose run for about 5-10 minutes, daughter says his nose runs constantly; like clear water dripping from the nose; that has been going on for a while; taking allergy medicine, but doesn't help; headaches have been going on for a few months; has neurologist and sees him next month; the only other change is that they are now drinking caffeinated coffee instead of decaf  Dementia; meals on wheels helping provide food; daughter is very helpful; seeing neurologist; on medicine; chronic and stable right now  CKD; last Cr was 1.27 on May 10th; Cr 1.27 8 months ago too  Depression screen [facility] 2/9 03/24/2018 01/19/2018 09/19/2017 06/19/2017 06/16/2017  Decreased Interest 0 0 0 0 0  Down, Depressed, Hopeless 0 0 0 0 0  PHQ - 2 Score 0 0 0 0 0  Altered sleeping 0 - - - -  Tired, decreased energy 0 - - - -  Change in appetite 0 - - - -  Feeling bad or failure about yourself  0 - - - -  Trouble concentrating 0 - - - -  Moving slowly or  fidgety/restless 0 - - - -  Suicidal thoughts 0 - - - -  PHQ-9 Score 0 - - - -  Difficult doing work/chores Not difficult at all - - - -   Fall Risk  03/24/2018 01/19/2018 09/19/2017 06/19/2017 06/16/2017  Falls in the past year? Yes No Yes No No  Number falls in past yr: 1 - 1 - -  Injury with Fall? No - No - -  Risk Factor Category  - - - - -  Follow up - - - - -    Relevant past medical, surgical, family and social history reviewed Past Medical History:  Diagnosis Date  . Arthritis   . CKD (chronic kidney disease) stage 3, GFR 30-59 ml/min (HCC) 12/29/2015  . Dry gangrene (HCC) 06/16/2017   Great toe and 5th toe LEFT foot; Dr. Wyn Quaker  . Hyperlipidemia   . Hypertension   . Left leg DVT (HCC) 01/2014  . Myocardial infarction (HCC)   . Pulmonary embolism (HCC) 01/2014  . Thoracic aorta atherosclerosis (HCC) 10/08/2016   Past Surgical History:  Procedure Laterality Date  . COLONOSCOPY    . CORONARY STENT PLACEMENT  2006  . JOINT REPLACEMENT  2011 & 2012  both hips  . len replaced Left 2015  . LOWER EXTREMITY ANGIOGRAPHY N/A 05/26/2017   Procedure: Lower Extremity Angiography;  Surgeon: Annice Needyew, Jason S, MD;  Location: [facility];  Service: Cardiovascular;  Laterality: N/A;  . LOWER EXTREMITY ANGIOGRAPHY Left 11/10/2017   Procedure: LOWER EXTREMITY ANGIOGRAPHY;  Surgeon: Annice Needyew, Jason S, MD;  Location: [facility];  Service: Cardiovascular;  Laterality: Left;  Marland Kitchen. MELANOMA EXCISION     40 years ago   Family History  Problem Relation Age of Onset  . Arthritis Mother   . Arthritis Father   . Depression Father   . Hypertension Daughter   . Pulmonary Hypertension Daughter    Social History   Tobacco Use  . Smoking status: Former Smoker    Types: Pipe    Last attempt to quit: 1970    Years since quitting: 49.7  . Smokeless tobacco: Never Used  Substance Use Topics  . Alcohol use: No    Alcohol/week: 0.0 standard drinks  . Drug use: No     [facility]  AUDIT-C Score  0      Interim medical history since last visit reviewed. Allergies and medications reviewed  Review of Systems Per HPI unless specifically indicated above     Objective:    BP 132/68   Pulse 67   Temp 98.1 F (36.7 C) (Oral)   Ht 5\' 1"  (1.549 m)   Wt 154 lb (69.9 kg)   SpO2 94%   BMI 29.10 kg/m   Wt Readings from Last 3 Encounters:  03/24/18 154 lb (69.9 kg)  03/10/18 151 lb (68.5 kg)  01/19/18 150 lb 8 oz (68.3 kg)    Physical Exam  Constitutional: He appears well-developed and well-nourished. No distress.  Elderly male, no distress, slouched posture  HENT:  Head: Normocephalic and atraumatic.  Eyes: EOM are normal. No scleral icterus.  Visual fields intact peripherally OU  Neck: No thyromegaly present.  Cardiovascular: Normal rate and regular rhythm.  Pulmonary/Chest: Effort normal and breath sounds normal.  Abdominal: Soft. Bowel sounds are normal. He exhibits no distension.  Musculoskeletal: He exhibits no edema.  Neurological: Coordination normal.  Skin: Skin is warm and dry. No pallor.  Psychiatric: He has a normal mood and affect. His mood appears not anxious. He does not exhibit a depressed mood.    Results for orders placed or performed in visit on 01/15/18  Microscopic Examination  Result Value Ref Range   WBC, UA None seen 0 - 5 /hpf   RBC, UA 0-2 0 - 2 /hpf   Epithelial Cells (non renal) None seen 0 - 10 /hpf   Casts Present (A) None seen /lpf   Cast Type Hyaline casts N/A   Mucus, UA Present (A) Not Estab.   Bacteria, UA Few (A) None seen/Few  Urinalysis, Complete  Result Value Ref Range   Specific Gravity, UA 1.025 1.005 - 1.030   pH, UA 6.0 5.0 - 7.5   Color, UA Yellow Yellow   Appearance Ur Clear Clear   Leukocytes, UA Negative Negative   Protein, UA 1+ (A) Negative/Trace   Glucose, UA Negative Negative   Ketones, UA Negative Negative   RBC, UA Negative Negative   Bilirubin,  UA Negative Negative   Urobilinogen, Ur 0.2 0.2 - 1.0 mg/dL   Nitrite, UA Negative Negative   Microscopic Examination See below:       Assessment & Plan:   Problem  List Items Addressed This Visit      Cardiovascular and Mediastinum   Hypertensive heart/kidney disease without HF and with CKD stage III (HCC) - Primary    Chronic, controlled with medicine        Genitourinary   CKD (chronic kidney disease) stage 3, GFR 30-59 ml/min (HCC)    Reviewed last Cr; stable overall        Other   Poor posture    Encouraged better posture; no wheezing on exam, but may be causing atelectasis      Mixed hyperlipidemia    Patient declined labs today; continue statin      Memory loss    F/u with neurologist; continue med; daughter helpful       Other Visit Diagnoses    Rhinorrhea       with headache; pt describes clear dripping fluid; will forward note to neurologist; ordering eval of drainage to see if CSF   Relevant Orders   Beta 2 transferrin   Nonintractable episodic headache, unspecified headache type       I directed daughter to notify patient's neurologist and I'll send note to alert him   Need for influenza vaccination       given today   Relevant Orders   Flu vaccine HIGH DOSE PF (Fluzone High dose) (Completed)      Follow up plan: Return in about 3 months (around 06/23/2018) for follow-up visit with Dr. Sherie Don.  An after-visit summary was printed and given to the patient at check-out.  Please see the patient instructions which may contain other information and recommendations beyond what is mentioned above in the assessment and plan.  No orders of the defined types were placed in this encounter.  Orders Placed This Encounter  Procedures  . Flu vaccine HIGH DOSE PF (Fluzone High dose)  . Beta 2 transferrin   cc: Dr. Malvin Johns, neurologist

## 2018-03-24 NOTE — Patient Instructions (Addendum)
Please let your neurologist know about these symptoms We will see what the nasal fluid shows If you have any changes in your vision or headaches, then let me know Try to limit saturated fats in your diet (bologna, hot dogs, barbeque, cheeseburgers, hamburgers, steak, bacon, sausage, cheese, etc.) and get more fresh fruits, vegetables, and whole grains

## 2018-03-24 NOTE — Assessment & Plan Note (Addendum)
Reviewed last Cr; stable overall

## 2018-03-24 NOTE — Assessment & Plan Note (Signed)
Encouraged better posture; no wheezing on exam, but may be causing atelectasis

## 2018-05-06 ENCOUNTER — Other Ambulatory Visit: Payer: Self-pay | Admitting: Family Medicine

## 2018-05-24 IMAGING — CR DG CHEST 2V
1 series · 2 of 2 positions shown · non-contrast
Comparison: 10/08/2016

CLINICAL DATA: Productive cough for over 1 week

EXAM:
CHEST  2 VIEW

[Series 1: dg chest 2 view · 0.14mm/px · 2 of 2 slices shown]
[im 1/2]
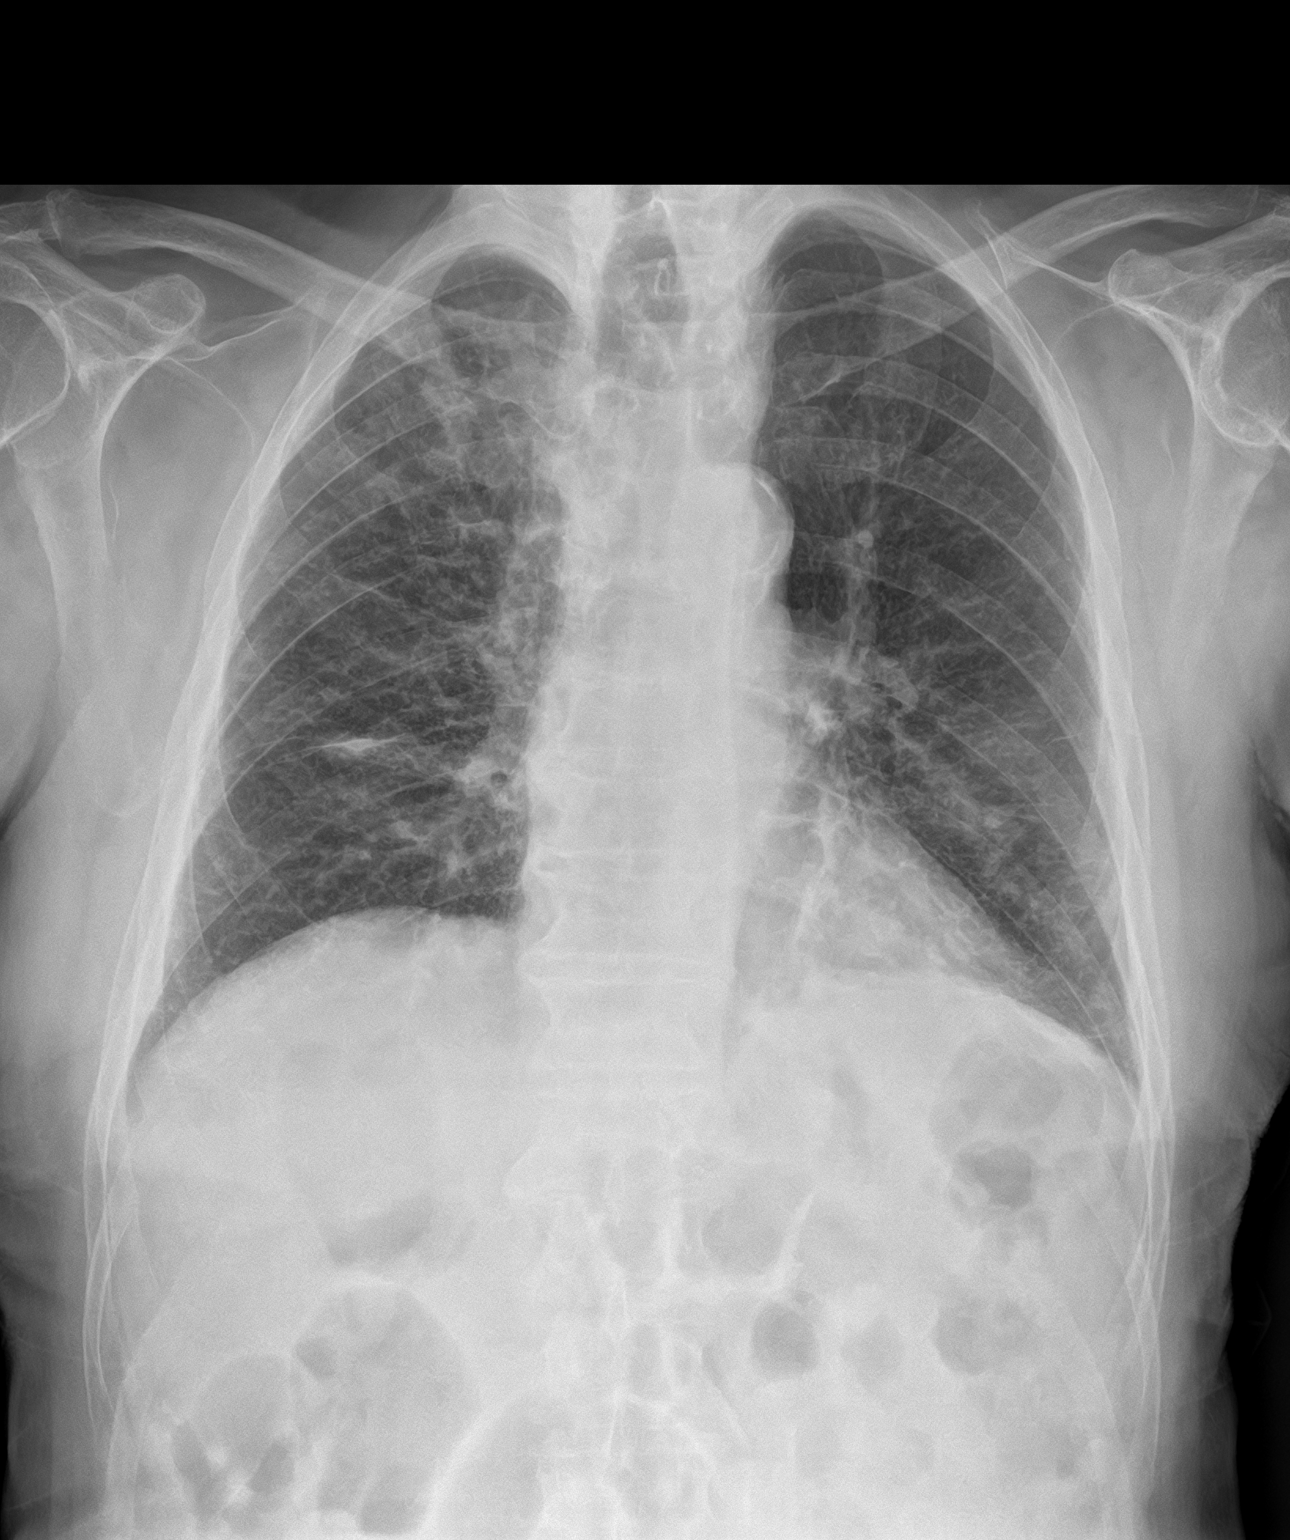
[im 2/2]
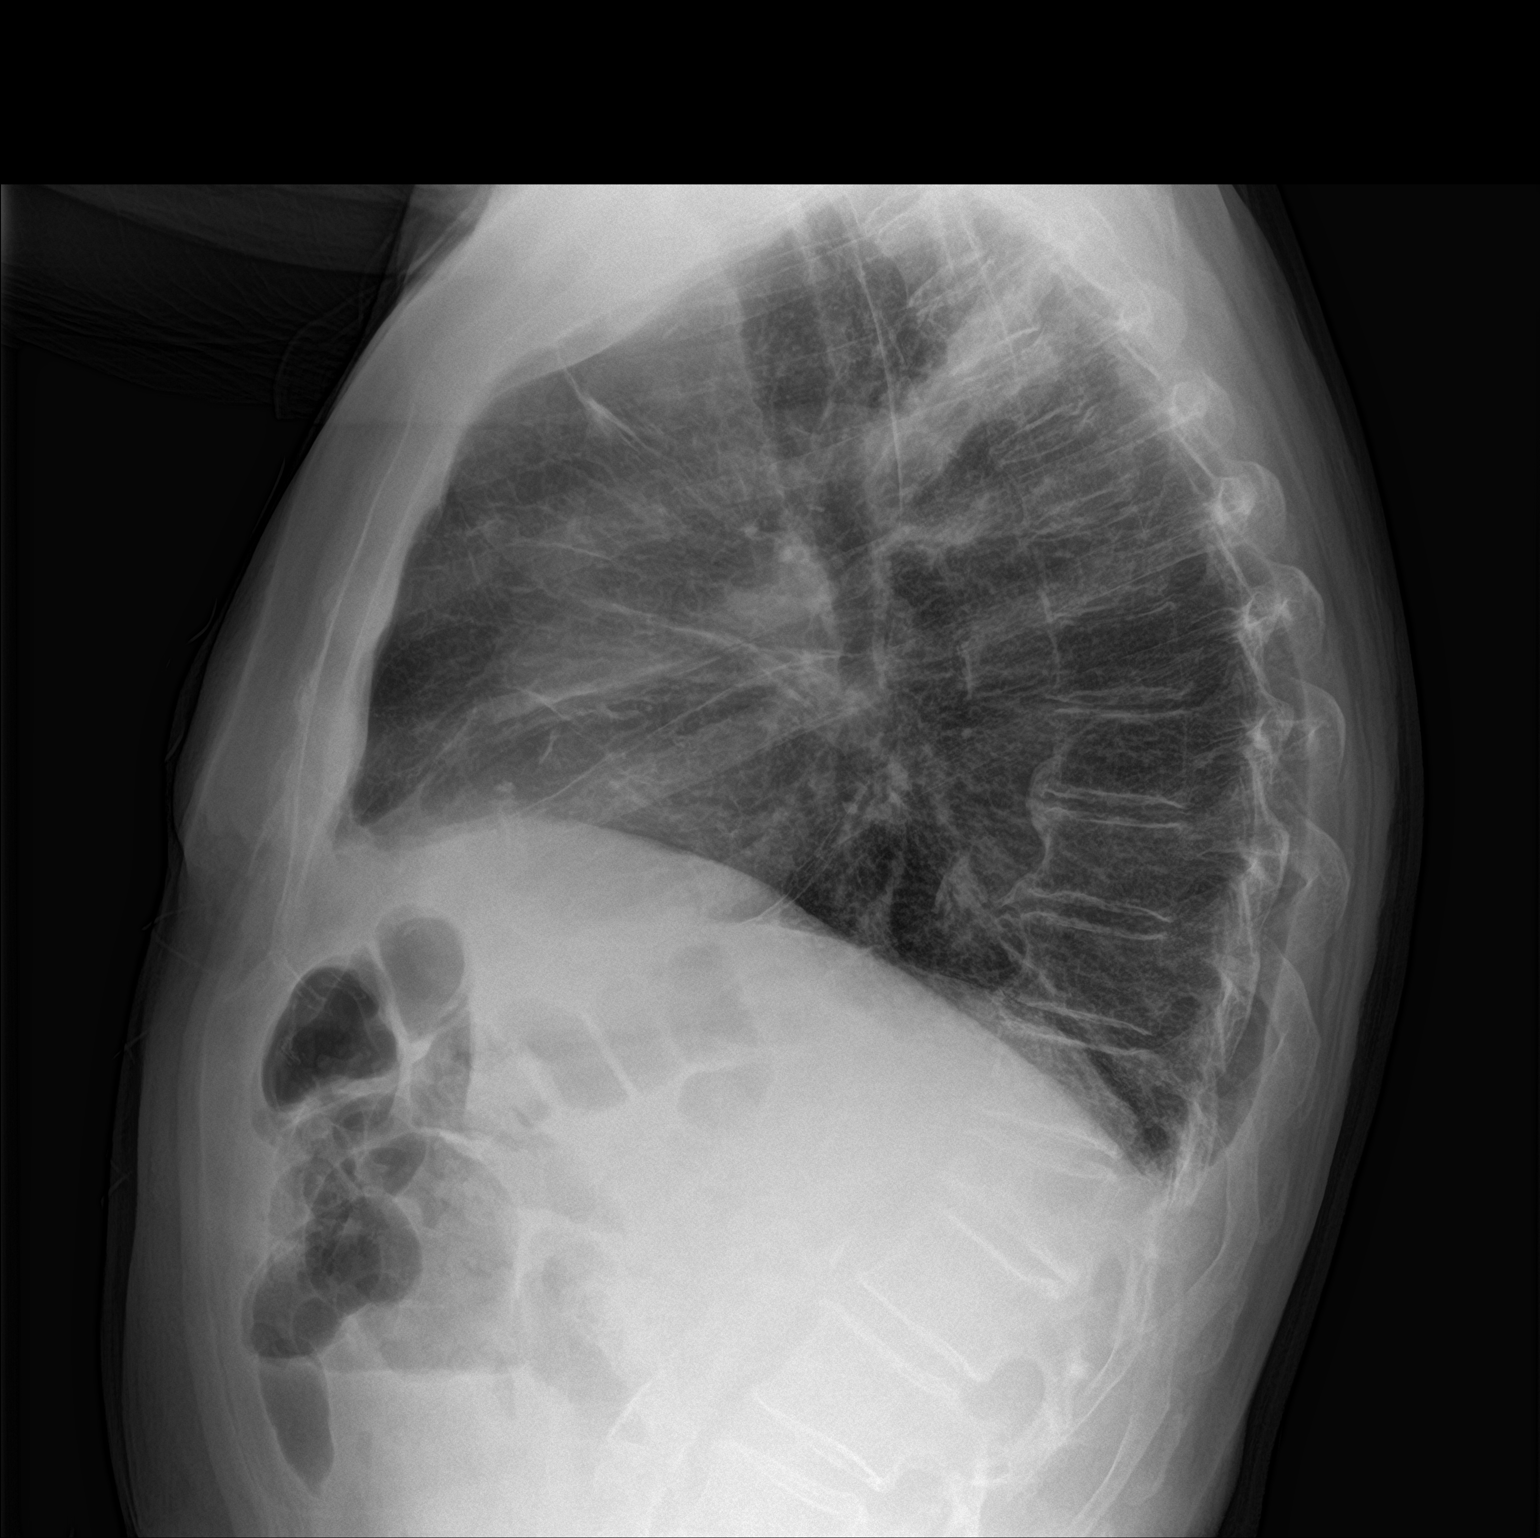

[2 of 2 positions shown; findings below may reference images not displayed]

FINDINGS: Heart is normal size. Increasing opacity in the right upper lobe
concerning for pneumonia. Underlying chronic lung disease with
scarring/ fibrosis throughout the lungs, most pronounced in the left
lung base. No effusions. Heart is normal size.
IMPRESSION: New right upper lobe airspace opacity concerning for right upper
lobe pneumonia superimposed on chronic lung disease.

## 2018-05-25 ENCOUNTER — Other Ambulatory Visit: Payer: Self-pay | Admitting: Family Medicine

## 2018-05-25 DIAGNOSIS — I10 Essential (primary) hypertension: Secondary | ICD-10-CM

## 2018-06-17 ENCOUNTER — Other Ambulatory Visit (INDEPENDENT_AMBULATORY_CARE_PROVIDER_SITE_OTHER): Payer: Self-pay | Admitting: Vascular Surgery

## 2018-06-18 ENCOUNTER — Encounter: Payer: Self-pay | Admitting: Family Medicine

## 2018-06-18 ENCOUNTER — Other Ambulatory Visit (INDEPENDENT_AMBULATORY_CARE_PROVIDER_SITE_OTHER): Payer: Self-pay | Admitting: Nurse Practitioner

## 2018-06-18 ENCOUNTER — Ambulatory Visit (INDEPENDENT_AMBULATORY_CARE_PROVIDER_SITE_OTHER): Payer: Medicare HMO | Admitting: Family Medicine

## 2018-06-18 VITALS — BP 122/60 | HR 75 | Temp 97.6°F | Ht 61.0 in | Wt 147.4 lb

## 2018-06-18 DIAGNOSIS — Z5181 Encounter for therapeutic drug level monitoring: Secondary | ICD-10-CM

## 2018-06-18 DIAGNOSIS — M170 Bilateral primary osteoarthritis of knee: Secondary | ICD-10-CM | POA: Diagnosis not present

## 2018-06-18 DIAGNOSIS — F0390 Unspecified dementia without behavioral disturbance: Secondary | ICD-10-CM

## 2018-06-18 DIAGNOSIS — I739 Peripheral vascular disease, unspecified: Secondary | ICD-10-CM | POA: Diagnosis not present

## 2018-06-18 DIAGNOSIS — I1310 Hypertensive heart and chronic kidney disease without heart failure, with stage 1 through stage 4 chronic kidney disease, or unspecified chronic kidney disease: Secondary | ICD-10-CM

## 2018-06-18 DIAGNOSIS — L235 Allergic contact dermatitis due to other chemical products: Secondary | ICD-10-CM | POA: Diagnosis not present

## 2018-06-18 DIAGNOSIS — I2510 Atherosclerotic heart disease of native coronary artery without angina pectoris: Secondary | ICD-10-CM | POA: Diagnosis not present

## 2018-06-18 DIAGNOSIS — N183 Chronic kidney disease, stage 3 unspecified: Secondary | ICD-10-CM

## 2018-06-18 DIAGNOSIS — I10 Essential (primary) hypertension: Secondary | ICD-10-CM

## 2018-06-18 DIAGNOSIS — F03A0 Unspecified dementia, mild, without behavioral disturbance, psychotic disturbance, mood disturbance, and anxiety: Secondary | ICD-10-CM

## 2018-06-18 MED ORDER — TRIAMCINOLONE ACETONIDE 0.1 % EX LOTN: 1.0000 "application " | TOPICAL_LOTION | Freq: Three times a day (TID) | CUTANEOUS | 1 refills | Status: DC

## 2018-06-18 NOTE — Assessment & Plan Note (Signed)
Monitored by vascular doctor

## 2018-06-18 NOTE — Assessment & Plan Note (Signed)
Continue to f/u with neurologist, Dr. Malvin JohnsPotter

## 2018-06-18 NOTE — Assessment & Plan Note (Signed)
asymptomatic

## 2018-06-18 NOTE — Assessment & Plan Note (Signed)
Excellent control, limit salt

## 2018-06-18 NOTE — Assessment & Plan Note (Signed)
Encouraged more water intake, keep urine pale yellow

## 2018-06-18 NOTE — Progress Notes (Signed)
BP 122/60   Pulse 75   Temp 97.6 F (36.4 C) (Oral)   Ht 5\' 1"  (1.549 m)   Wt 147 lb 6.4 oz (66.9 kg)   SpO2 97%   BMI 27.85 kg/m    Subjective:    Patient ID: [name] [name], male    DOB: October 17, 1927, 84 y.o.   MRN: [id number]  HPI: [name] [name] is a 85 y.o. male  Chief Complaint  Patient presents with  . Follow-up  . Rash    tiny bumps, arms, legs and backs    HPI He is here for f/u Having a rash all over, started 2 weeks ago; little tiny bumps and he scratches them and they bleed Whole body is covered with little red pimples Her daughter got them new body wash Daughter has never bought them deodorant, and started buying deodorant too but rash is much more extensive and did not start under the arms Laundry detergent at [employer]; not [employer], that is not new, present for months   Coronary artery disease; no chest pain  Hypertension; controlled here; not checking BP at home; adds very little salt or none at all to his food  CKD stage 3; tylenol if any aches or pains; no NSAIDs; not much pain; does not drink much water  PVD; toes do not go cold or purple; sees vascular doctor  Dementia; can remember remote things but not much recent stuff; sees Dr. Malvin Johns, neurologist; daughter thinks he is about the same over the last 6 months  Hx of hepatitis one year ago; never figured out the source; last LFTs were normal  He might get shots in his knees today; arthritis; B but left worse than right  Depression screen [facility] 2/9 06/18/2018 03/24/2018 01/19/2018 09/19/2017 06/19/2017  Decreased Interest 1 0 0 0 0  Down, Depressed, Hopeless 1 0 0 0 0  PHQ - 2 Score 2 0 0 0 0  Altered sleeping 1 0 - - -  Tired, decreased energy 1 0 - - -  Change in appetite 1 0 - - -  Feeling bad or failure about yourself  0 0 - - -  Trouble concentrating 0 0 - - -  Moving slowly or fidgety/restless 0 0 - - -  Suicidal thoughts 0 0 - - -  PHQ-9 Score 5 0 - - -  Difficult doing work/chores Somewhat  difficult Not difficult at all - - -   Fall Risk  06/18/2018 03/24/2018 01/19/2018 09/19/2017 06/19/2017  Falls in the past year? 1 Yes No Yes No  Number falls in past yr: 1 1 - 1 -  Injury with Fall? 0 No - No -  Risk Factor Category  - - - - -  Follow up - - - - -    Relevant past medical, surgical, family and social history reviewed Past Medical History:  Diagnosis Date  . Arthritis   . CKD (chronic kidney disease) stage 3, GFR 30-59 ml/min (HCC) 12/29/2015  . Dry gangrene (HCC) 06/16/2017   Great toe and 5th toe LEFT foot; Dr. Wyn Quaker  . Hyperlipidemia   . Hypertension   . Left leg DVT (HCC) 01/2014  . Myocardial infarction (HCC)   . Pulmonary embolism (HCC) 01/2014  . Thoracic aorta atherosclerosis (HCC) 10/08/2016   Past Surgical History:  Procedure Laterality Date  . COLONOSCOPY    . CORONARY STENT PLACEMENT  2006  . JOINT REPLACEMENT  2011 & 2012   both hips  . len  replaced Left 2015  . LOWER EXTREMITY ANGIOGRAPHY N/A 05/26/2017   Procedure: Lower Extremity Angiography;  Surgeon: Annice Needy, MD;  Location: [facility] INVASIVE CV LAB;  Service: Cardiovascular;  Laterality: N/A;  . LOWER EXTREMITY ANGIOGRAPHY Left 11/10/2017   Procedure: LOWER EXTREMITY ANGIOGRAPHY;  Surgeon: Annice Needy, MD;  Location: [facility] INVASIVE CV LAB;  Service: Cardiovascular;  Laterality: Left;  Marland Kitchen MELANOMA EXCISION     40 years ago   Family History  Problem Relation Age of Onset  . Arthritis Mother   . Arthritis Father   . Depression Father   . Hypertension Daughter   . Pulmonary Hypertension Daughter    Social History   Tobacco Use  . Smoking status: Former Smoker    Types: Pipe    Last attempt to quit: 1970    Years since quitting: 50.0  . Smokeless tobacco: Never Used  Substance Use Topics  . Alcohol use: No    Alcohol/week: 0.0 standard drinks  . Drug use: No     [facility] Visit from 06/18/2018 in [facility]  AUDIT-C Score  0      Interim medical history since last  visit reviewed. Allergies and medications reviewed  Review of Systems Per HPI unless specifically indicated above     Objective:    BP 122/60   Pulse 75   Temp 97.6 F (36.4 C) (Oral)   Ht 5\' 1"  (1.549 m)   Wt 147 lb 6.4 oz (66.9 kg)   SpO2 97%   BMI 27.85 kg/m   Wt Readings from Last 3 Encounters:  06/18/18 147 lb 6.4 oz (66.9 kg)  03/24/18 154 lb (69.9 kg)  03/10/18 151 lb (68.5 kg)    Physical Exam Constitutional:      General: He is not in acute distress.    Appearance: He is well-developed.  HENT:     Head: Normocephalic and atraumatic.  Eyes:     General: No scleral icterus. Neck:     Thyroid: No thyromegaly.  Cardiovascular:     Rate and Rhythm: Normal rate and regular rhythm.  Pulmonary:     Effort: Pulmonary effort is normal.     Breath sounds: Normal breath sounds.  Abdominal:     General: Bowel sounds are normal. There is no distension.     Palpations: Abdomen is soft.  Musculoskeletal:     Right lower leg: No edema.     Left lower leg: No edema.  Skin:    General: Skin is warm and dry.     Coloration: Skin is not pale.     Findings: Rash (diffuse erythematous rash all over sparing the face essentially) present.  Neurological:     Coordination: Coordination normal.  Psychiatric:        Behavior: Behavior normal.        Thought Content: Thought content normal.        Judgment: Judgment normal.    Results for orders placed or performed in visit on 06/18/18  COMPLETE METABOLIC PANEL WITH GFR  Result Value Ref Range   Glucose, Bld 95 65 - 99 mg/dL   BUN 23 7 - 25 mg/dL   Creat 1.61 (H) 0.96 - 1.11 mg/dL   GFR, Est Non African American 39 (L) > OR = 60 mL/min/1.39m2   GFR, Est African American 45 (L) > OR = 60 mL/min/1.96m2   BUN/Creatinine Ratio 15 6 - 22 (calc)   Sodium 143 135 - 146 mmol/L   Potassium 4.8  3.5 - 5.3 mmol/L   Chloride 108 98 - 110 mmol/L   CO2 27 20 - 32 mmol/L   Calcium 9.5 8.6 - 10.3 mg/dL   Total Protein 6.9 6.1 - 8.1  g/dL   Albumin 3.9 3.6 - 5.1 g/dL   Globulin 3.0 1.9 - 3.7 g/dL (calc)   AG Ratio 1.3 1.0 - 2.5 (calc)   Total Bilirubin 0.4 0.2 - 1.2 mg/dL   Alkaline phosphatase (APISO) 84 40 - 115 U/L   AST 22 10 - 35 U/L   ALT 14 9 - 46 U/L  Lipid panel  Result Value Ref Range   Cholesterol 100 <200 mg/dL   HDL 29 (L) >29>40 mg/dL   Triglycerides 562147 <130<150 mg/dL   LDL Cholesterol (Calc) 48 mg/dL (calc)   Total CHOL/HDL Ratio 3.4 <5.0 (calc)   Non-HDL Cholesterol (Calc) 71 <865<130 mg/dL (calc)  CBC with Differential/Platelet  Result Value Ref Range   WBC 6.4 3.8 - 10.8 Thousand/uL   RBC 4.65 4.20 - 5.80 Million/uL   Hemoglobin 11.0 (L) 13.2 - 17.1 g/dL   HCT 78.436.8 (L) 69.638.5 - 29.550.0 %   MCV 79.1 (L) 80.0 - 100.0 fL   MCH 23.7 (L) 27.0 - 33.0 pg   MCHC 29.9 (L) 32.0 - 36.0 g/dL   RDW 28.414.3 13.211.0 - 44.015.0 %   Platelets 236 140 - 400 Thousand/uL   MPV 10.4 7.5 - 12.5 fL   Neutro Abs 3,590 1,500 - 7,800 cells/uL   Lymphs Abs 1,434 850 - 3,900 cells/uL   Absolute Monocytes 768 200 - 950 cells/uL   Eosinophils Absolute 576 (H) 15 - 500 cells/uL   Basophils Absolute 32 0 - 200 cells/uL   Neutrophils Relative % 56.1 %   Total Lymphocyte 22.4 %   Monocytes Relative 12.0 %   Eosinophils Relative 9.0 %   Basophils Relative 0.5 %      Assessment & Plan:   Problem List Items Addressed This Visit      Cardiovascular and Mediastinum   Peripheral vascular disease (HCC)    Monitored by vascular doctor      Hypertensive heart/kidney disease without HF and with CKD stage III (HCC)    Encouraged more water intake, keep urine pale yellow      Essential hypertension    Excellent control, limit salt      CAD (coronary artery disease), native coronary artery    asymptomatic      Relevant Orders   Lipid panel (Completed)     Nervous and Auditory   Mild dementia (HCC)    Continue to f/u with neurologist, Dr. Malvin JohnsPotter        Musculoskeletal and Integument   Osteoarthritis of knee    Followed by  ortho        Genitourinary   CKD (chronic kidney disease) stage 3, GFR 30-59 ml/min (HCC)    Avoid NSAIDs; encouraged more water intake        Other   Medication monitoring encounter   Relevant Orders   COMPLETE METABOLIC PANEL WITH GFR (Completed)   CBC with Differential/Platelet (Completed)    Other Visit Diagnoses    Allergic dermatitis due to other chemical product    -  Primary   get rid of offending agent; mild soap only where dirty, just water everywhere else, pat dry; topical steroid; to derm if not improving       Follow up plan: Return in about 5 months (around 11/02/2018) for follow-up visit with Dr.  Lada (before or after, either way okay).  An after-visit summary was printed and given to the patient at check-out.  Please see the patient instructions which may contain other information and recommendations beyond what is mentioned above in the assessment and plan.  Meds ordered this encounter  Medications  . triamcinolone lotion (KENALOG) 0.1 %    Sig: Apply 1 application topically 3 (three) times daily.    Dispense:  120 mL    Refill:  1    Orders Placed This Encounter  Procedures  . COMPLETE METABOLIC PANEL WITH GFR  . Lipid panel  . CBC with Differential/Platelet

## 2018-06-18 NOTE — Patient Instructions (Addendum)
Use the new medicine on your rash Get rid of the body wash Use something very mild for bathing Only use soap where you are dirty, under the armpits, and in the groin and private parts Pat dry after showers or baths We'll get labs today If you have not heard anything from my staff in a week about any orders/referrals/studies from today, please contact us here to follow-up [phone]

## 2018-06-18 NOTE — Assessment & Plan Note (Signed)
Followed by ortho 

## 2018-06-18 NOTE — Assessment & Plan Note (Signed)
Avoid NSAIDs; encouraged more water intake

## 2018-06-19 ENCOUNTER — Other Ambulatory Visit: Payer: Self-pay | Admitting: Family Medicine

## 2018-06-19 ENCOUNTER — Telehealth: Payer: Self-pay | Admitting: Family Medicine

## 2018-06-19 DIAGNOSIS — D509 Iron deficiency anemia, unspecified: Secondary | ICD-10-CM

## 2018-06-19 DIAGNOSIS — N289 Disorder of kidney and ureter, unspecified: Secondary | ICD-10-CM

## 2018-06-19 LAB — COMPLETE METABOLIC PANEL WITH GFR
AG Ratio: 1.3 (calc) (ref 1.0–2.5)
ALT: 14 U/L (ref 9–46)
AST: 22 U/L (ref 10–35)
Albumin: 3.9 g/dL (ref 3.6–5.1)
Alkaline phosphatase (APISO): 84 U/L (ref 40–115)
BUN/Creatinine Ratio: 15 (calc) (ref 6–22)
BUN: 23 mg/dL (ref 7–25)
CO2: 27 mmol/L (ref 20–32)
Calcium: 9.5 mg/dL (ref 8.6–10.3)
Chloride: 108 mmol/L (ref 98–110)
Creat: 1.57 mg/dL — ABNORMAL HIGH (ref 0.70–1.11)
GFR, EST NON AFRICAN AMERICAN: 39 mL/min/{1.73_m2} — AB (ref >=60)
GFR, Est African American: 45 mL/min/{1.73_m2} — ABNORMAL LOW (ref >=60)
Globulin: 3 g/dL (calc) (ref 1.9–3.7)
Glucose, Bld: 95 mg/dL (ref 65–99)
Potassium: 4.8 mmol/L (ref 3.5–5.3)
Sodium: 143 mmol/L (ref 135–146)
TOTAL PROTEIN: 6.9 g/dL (ref 6.1–8.1)
Total Bilirubin: 0.4 mg/dL (ref 0.2–1.2)

## 2018-06-19 LAB — CBC WITH DIFFERENTIAL/PLATELET
Absolute Monocytes: 768 cells/uL (ref 200–950)
Basophils Absolute: 32 cells/uL (ref 0–200)
Basophils Relative: 0.5 %
EOS ABS: 576 {cells}/uL — AB (ref 15–500)
Eosinophils Relative: 9 %
HCT: 36.8 % — ABNORMAL LOW (ref 38.5–50.0)
Hemoglobin: 11 g/dL — ABNORMAL LOW (ref 13.2–17.1)
Lymphs Abs: 1434 cells/uL (ref 850–3900)
MCH: 23.7 pg — AB (ref 27.0–33.0)
MCHC: 29.9 g/dL — AB (ref 32.0–36.0)
MCV: 79.1 fL — ABNORMAL LOW (ref 80.0–100.0)
MPV: 10.4 fL (ref 7.5–12.5)
Monocytes Relative: 12 %
Neutro Abs: 3590 cells/uL (ref 1500–7800)
Neutrophils Relative %: 56.1 %
Platelets: 236 10*3/uL (ref 140–400)
RBC: 4.65 10*6/uL (ref 4.20–5.80)
RDW: 14.3 % (ref 11.0–15.0)
Total Lymphocyte: 22.4 %
WBC: 6.4 10*3/uL (ref 3.8–10.8)

## 2018-06-19 LAB — LIPID PANEL
Cholesterol: 100 mg/dL (ref <200)
HDL: 29 mg/dL — ABNORMAL LOW (ref >40)
LDL Cholesterol (Calc): 48 mg/dL (calc)
Non-HDL Cholesterol (Calc): 71 mg/dL (calc) (ref <130)
Total CHOL/HDL Ratio: 3.4 (calc) (ref <5.0)
Triglycerides: 147 mg/dL (ref <150)

## 2018-06-19 NOTE — Telephone Encounter (Signed)
Patient has some abnormal blood results I spoke with daughter He is anemic, microcytic hypochromic I asked if he ever saw the doctor about the rectal bleeding; patient declined an exam with surgeon in July She'll pick up stool cards and I'll tape them to the door with "[name] [name]" Kidney function has really declined in the last few months; he drinks soda, drinking more propel now, not enough fluids total, just a bottle and half a day; really pick up fluids Low HDL, pretty sedentary; low HDL associated heart attacks Recheck labs on Thursday (BMP, CBC, ferritin, TIBC, saturation) Eliquis needs to be considered by the vascular doctor anyway, since he appears to be losing blood perhaps from GI source; will send copy of labs to vascular doctor

## 2018-06-19 NOTE — Progress Notes (Signed)
Labs for Dec 26th Set of stool cards taped to outside door, daughter to pick up this evening

## 2018-06-22 MED ORDER — CLOPIDOGREL BISULFATE 75 MG PO TABS: 75.0000 mg | ORAL_TABLET | Freq: Every day | ORAL | 0 refills | Status: DC

## 2018-06-22 NOTE — Addendum Note (Signed)
Addended by: LADA, Janit BernMELINDA P on: 06/22/2018 09:12 AM   Modules accepted: Orders

## 2018-06-22 NOTE — Telephone Encounter (Signed)
Yes, while we work up this anemia

## 2018-06-22 NOTE — Telephone Encounter (Signed)
He has aspirin listed as an allergy Allergies  Allergen Reactions  . Aspirin Anaphylaxis, Swelling and Shortness Of Breath    REACTION: terrible bp reaction/wheezing  . Bee Pollen Other (See Comments)    ragweed  . Pollen Extract     ragweed  . Rabbit Epithelium     rabbits

## 2018-06-22 NOTE — Telephone Encounter (Signed)
[name] MuirJamie, let patient / daughter know to STOP Eliquis We'll use Plavix short-term while we work up this anemia

## 2018-06-22 NOTE — Addendum Note (Signed)
Addended by: Lorrane Mccay, Janit BernMELINDA P on: 06/22/2018 08:29 AM   Modules accepted: Orders

## 2018-06-22 NOTE — Telephone Encounter (Signed)
Left detailed voicemail

## 2018-06-28 ENCOUNTER — Other Ambulatory Visit: Payer: Self-pay

## 2018-06-28 ENCOUNTER — Inpatient Hospital Stay
Admission: EM | Admit: 2018-06-28 | Discharge: 2018-07-13 | DRG: 270 | Disposition: A | Discharge status: Hospice | Payer: Medicare HMO | Attending: Internal Medicine | Admitting: Internal Medicine

## 2018-06-28 DIAGNOSIS — N179 Acute kidney failure, unspecified: Secondary | ICD-10-CM | POA: Diagnosis present

## 2018-06-28 DIAGNOSIS — Z515 Encounter for palliative care: Secondary | ICD-10-CM | POA: Diagnosis present

## 2018-06-28 DIAGNOSIS — Z7401 Bed confinement status: Secondary | ICD-10-CM | POA: Diagnosis not present

## 2018-06-28 DIAGNOSIS — I2510 Atherosclerotic heart disease of native coronary artery without angina pectoris: Secondary | ICD-10-CM | POA: Diagnosis not present

## 2018-06-28 DIAGNOSIS — R0902 Hypoxemia: Secondary | ICD-10-CM | POA: Diagnosis not present

## 2018-06-28 DIAGNOSIS — I998 Other disorder of circulatory system: Secondary | ICD-10-CM

## 2018-06-28 DIAGNOSIS — J9801 Acute bronchospasm: Secondary | ICD-10-CM | POA: Diagnosis not present

## 2018-06-28 DIAGNOSIS — R21 Rash and other nonspecific skin eruption: Secondary | ICD-10-CM | POA: Diagnosis not present

## 2018-06-28 DIAGNOSIS — Z9109 Other allergy status, other than to drugs and biological substances: Secondary | ICD-10-CM

## 2018-06-28 DIAGNOSIS — E876 Hypokalemia: Secondary | ICD-10-CM | POA: Diagnosis present

## 2018-06-28 DIAGNOSIS — I480 Paroxysmal atrial fibrillation: Secondary | ICD-10-CM | POA: Diagnosis not present

## 2018-06-28 DIAGNOSIS — Z955 Presence of coronary angioplasty implant and graft: Secondary | ICD-10-CM

## 2018-06-28 DIAGNOSIS — Z8249 Family history of ischemic heart disease and other diseases of the circulatory system: Secondary | ICD-10-CM

## 2018-06-28 DIAGNOSIS — E039 Hypothyroidism, unspecified: Secondary | ICD-10-CM | POA: Diagnosis present

## 2018-06-28 DIAGNOSIS — J984 Other disorders of lung: Secondary | ICD-10-CM | POA: Diagnosis not present

## 2018-06-28 DIAGNOSIS — I70222 Atherosclerosis of native arteries of extremities with rest pain, left leg: Secondary | ICD-10-CM | POA: Diagnosis not present

## 2018-06-28 DIAGNOSIS — Z85820 Personal history of malignant melanoma of skin: Secondary | ICD-10-CM

## 2018-06-28 DIAGNOSIS — E785 Hyperlipidemia, unspecified: Secondary | ICD-10-CM | POA: Diagnosis present

## 2018-06-28 DIAGNOSIS — Z8261 Family history of arthritis: Secondary | ICD-10-CM

## 2018-06-28 DIAGNOSIS — Z7902 Long term (current) use of antithrombotics/antiplatelets: Secondary | ICD-10-CM

## 2018-06-28 DIAGNOSIS — E0590 Thyrotoxicosis, unspecified without thyrotoxic crisis or storm: Secondary | ICD-10-CM | POA: Diagnosis present

## 2018-06-28 DIAGNOSIS — R404 Transient alteration of awareness: Secondary | ICD-10-CM | POA: Diagnosis not present

## 2018-06-28 DIAGNOSIS — I130 Hypertensive heart and chronic kidney disease with heart failure and stage 1 through stage 4 chronic kidney disease, or unspecified chronic kidney disease: Secondary | ICD-10-CM | POA: Diagnosis present

## 2018-06-28 DIAGNOSIS — M1990 Unspecified osteoarthritis, unspecified site: Secondary | ICD-10-CM | POA: Diagnosis present

## 2018-06-28 DIAGNOSIS — R52 Pain, unspecified: Secondary | ICD-10-CM | POA: Diagnosis not present

## 2018-06-28 DIAGNOSIS — J90 Pleural effusion, not elsewhere classified: Secondary | ICD-10-CM | POA: Diagnosis not present

## 2018-06-28 DIAGNOSIS — Z538 Procedure and treatment not carried out for other reasons: Secondary | ICD-10-CM | POA: Diagnosis not present

## 2018-06-28 DIAGNOSIS — M79605 Pain in left leg: Secondary | ICD-10-CM | POA: Diagnosis not present

## 2018-06-28 DIAGNOSIS — J811 Chronic pulmonary edema: Secondary | ICD-10-CM

## 2018-06-28 DIAGNOSIS — Z96643 Presence of artificial hip joint, bilateral: Secondary | ICD-10-CM | POA: Diagnosis present

## 2018-06-28 DIAGNOSIS — J9602 Acute respiratory failure with hypercapnia: Secondary | ICD-10-CM | POA: Diagnosis present

## 2018-06-28 DIAGNOSIS — M6282 Rhabdomyolysis: Secondary | ICD-10-CM | POA: Diagnosis not present

## 2018-06-28 DIAGNOSIS — J181 Lobar pneumonia, unspecified organism: Secondary | ICD-10-CM | POA: Diagnosis not present

## 2018-06-28 DIAGNOSIS — Z86711 Personal history of pulmonary embolism: Secondary | ICD-10-CM

## 2018-06-28 DIAGNOSIS — I5033 Acute on chronic diastolic (congestive) heart failure: Secondary | ICD-10-CM | POA: Diagnosis present

## 2018-06-28 DIAGNOSIS — E440 Moderate protein-calorie malnutrition: Secondary | ICD-10-CM | POA: Diagnosis present

## 2018-06-28 DIAGNOSIS — I493 Ventricular premature depolarization: Secondary | ICD-10-CM | POA: Diagnosis present

## 2018-06-28 DIAGNOSIS — Z66 Do not resuscitate: Secondary | ICD-10-CM | POA: Diagnosis not present

## 2018-06-28 DIAGNOSIS — W19XXXA Unspecified fall, initial encounter: Secondary | ICD-10-CM | POA: Diagnosis not present

## 2018-06-28 DIAGNOSIS — I25118 Atherosclerotic heart disease of native coronary artery with other forms of angina pectoris: Secondary | ICD-10-CM | POA: Diagnosis present

## 2018-06-28 DIAGNOSIS — I4891 Unspecified atrial fibrillation: Secondary | ICD-10-CM | POA: Diagnosis not present

## 2018-06-28 DIAGNOSIS — I10 Essential (primary) hypertension: Secondary | ICD-10-CM | POA: Diagnosis not present

## 2018-06-28 DIAGNOSIS — M62262 Nontraumatic ischemic infarction of muscle, left lower leg: Secondary | ICD-10-CM | POA: Diagnosis not present

## 2018-06-28 DIAGNOSIS — J810 Acute pulmonary edema: Secondary | ICD-10-CM | POA: Diagnosis not present

## 2018-06-28 DIAGNOSIS — J440 Chronic obstructive pulmonary disease with acute lower respiratory infection: Secondary | ICD-10-CM | POA: Diagnosis present

## 2018-06-28 DIAGNOSIS — J9601 Acute respiratory failure with hypoxia: Secondary | ICD-10-CM | POA: Diagnosis not present

## 2018-06-28 DIAGNOSIS — R0603 Acute respiratory distress: Secondary | ICD-10-CM | POA: Diagnosis not present

## 2018-06-28 DIAGNOSIS — I700 Atherosclerosis of aorta: Secondary | ICD-10-CM | POA: Diagnosis present

## 2018-06-28 DIAGNOSIS — D638 Anemia in other chronic diseases classified elsewhere: Secondary | ICD-10-CM | POA: Diagnosis present

## 2018-06-28 DIAGNOSIS — Z7189 Other specified counseling: Secondary | ICD-10-CM

## 2018-06-28 DIAGNOSIS — Z6823 Body mass index (BMI) 23.0-23.9, adult: Secondary | ICD-10-CM | POA: Diagnosis not present

## 2018-06-28 DIAGNOSIS — I739 Peripheral vascular disease, unspecified: Secondary | ICD-10-CM | POA: Diagnosis not present

## 2018-06-28 DIAGNOSIS — J189 Pneumonia, unspecified organism: Secondary | ICD-10-CM | POA: Diagnosis present

## 2018-06-28 DIAGNOSIS — M2550 Pain in unspecified joint: Secondary | ICD-10-CM | POA: Diagnosis not present

## 2018-06-28 DIAGNOSIS — F0390 Unspecified dementia without behavioral disturbance: Secondary | ICD-10-CM | POA: Diagnosis present

## 2018-06-28 DIAGNOSIS — D649 Anemia, unspecified: Secondary | ICD-10-CM | POA: Diagnosis not present

## 2018-06-28 DIAGNOSIS — N183 Chronic kidney disease, stage 3 (moderate): Secondary | ICD-10-CM | POA: Diagnosis present

## 2018-06-28 DIAGNOSIS — I252 Old myocardial infarction: Secondary | ICD-10-CM

## 2018-06-28 DIAGNOSIS — Z886 Allergy status to analgesic agent status: Secondary | ICD-10-CM

## 2018-06-28 DIAGNOSIS — Z87891 Personal history of nicotine dependence: Secondary | ICD-10-CM

## 2018-06-28 DIAGNOSIS — Z91030 Bee allergy status: Secondary | ICD-10-CM

## 2018-06-28 DIAGNOSIS — R531 Weakness: Secondary | ICD-10-CM | POA: Diagnosis not present

## 2018-06-28 DIAGNOSIS — Z86718 Personal history of other venous thrombosis and embolism: Secondary | ICD-10-CM

## 2018-06-28 DIAGNOSIS — H9190 Unspecified hearing loss, unspecified ear: Secondary | ICD-10-CM | POA: Diagnosis present

## 2018-06-28 DIAGNOSIS — R230 Cyanosis: Secondary | ICD-10-CM | POA: Diagnosis present

## 2018-06-28 DIAGNOSIS — Z79899 Other long term (current) drug therapy: Secondary | ICD-10-CM

## 2018-06-28 LAB — CBC WITH DIFFERENTIAL/PLATELET
ABS IMMATURE GRANULOCYTES: 0.05 10*3/uL (ref 0.00–0.07)
Basophils Absolute: 0 10*3/uL (ref 0.0–0.1)
Basophils Relative: 0 %
Eosinophils Absolute: 0.1 10*3/uL (ref 0.0–0.5)
Eosinophils Relative: 1 %
HCT: 33 % — ABNORMAL LOW (ref 39.0–52.0)
HEMOGLOBIN: 9.9 g/dL — AB (ref 13.0–17.0)
Immature Granulocytes: 0 %
Lymphocytes Relative: 10 %
Lymphs Abs: 1.4 10*3/uL (ref 0.7–4.0)
MCH: 23.7 pg — ABNORMAL LOW (ref 26.0–34.0)
MCHC: 30 g/dL (ref 30.0–36.0)
MCV: 78.9 fL — ABNORMAL LOW (ref 80.0–100.0)
Monocytes Absolute: 1.3 10*3/uL — ABNORMAL HIGH (ref 0.1–1.0)
Monocytes Relative: 9 %
NEUTROS ABS: 10.8 10*3/uL — AB (ref 1.7–7.7)
Neutrophils Relative %: 80 %
Platelets: 247 10*3/uL (ref 150–400)
RBC: 4.18 MIL/uL — ABNORMAL LOW (ref 4.22–5.81)
RDW: 15.9 % — ABNORMAL HIGH (ref 11.5–15.5)
WBC: 13.6 10*3/uL — ABNORMAL HIGH (ref 4.0–10.5)
nRBC: 0 % (ref 0.0–0.2)

## 2018-06-28 LAB — COMPREHENSIVE METABOLIC PANEL
ALT: 27 U/L (ref 0–44)
AST: 50 U/L — ABNORMAL HIGH (ref 15–41)
Albumin: 3.7 g/dL (ref 3.5–5.0)
Alkaline Phosphatase: 65 U/L (ref 38–126)
Anion gap: 9 (ref 5–15)
BUN: 31 mg/dL — ABNORMAL HIGH (ref 8–23)
CO2: 24 mmol/L (ref 22–32)
Calcium: 9.2 mg/dL (ref 8.9–10.3)
Chloride: 109 mmol/L (ref 98–111)
Creatinine, Ser: 1.16 mg/dL (ref 0.61–1.24)
GFR calc Af Amer: >60 mL/min (ref >60)
GFR calc non Af Amer: 56 mL/min — ABNORMAL LOW (ref >60)
Glucose, Bld: 116 mg/dL — ABNORMAL HIGH (ref 70–99)
Potassium: 4 mmol/L (ref 3.5–5.1)
SODIUM: 142 mmol/L (ref 135–145)
Total Bilirubin: 0.5 mg/dL (ref 0.3–1.2)
Total Protein: 7.1 g/dL (ref 6.5–8.1)

## 2018-06-28 LAB — CG4 I-STAT (LACTIC ACID): Lactic Acid, Venous: 2.16 mmol/L (ref 0.5–1.9)

## 2018-06-28 LAB — APTT: APTT: 29 s (ref 24–36)

## 2018-06-28 LAB — CK: Total CK: 1142 U/L — ABNORMAL HIGH (ref 49–397)

## 2018-06-28 LAB — PROTIME-INR
INR: 1.03
Prothrombin Time: 13.4 seconds (ref 11.4–15.2)

## 2018-06-28 MED ORDER — DONEPEZIL HCL 5 MG PO TABS
5.0000 mg | ORAL_TABLET | Freq: Every evening | ORAL | Status: DC
  Administered 2018-06-28 – 2018-07-09 (×9): 5 mg via ORAL
  Filled 2018-06-28 (×13): qty 1

## 2018-06-28 MED ORDER — VITAMIN B-12 1000 MCG PO TABS
1000.0000 ug | ORAL_TABLET | Freq: Every day | ORAL | Status: DC
  Administered 2018-06-28 – 2018-07-10 (×11): 1000 ug via ORAL
  Filled 2018-06-28 (×12): qty 1

## 2018-06-28 MED ORDER — HEPARIN BOLUS VIA INFUSION
4800.0000 [IU] | Freq: Once | INTRAVENOUS | Status: AC
  Administered 2018-06-28: 4800 [IU] via INTRAVENOUS
  Filled 2018-06-28: qty 4800

## 2018-06-28 MED ORDER — FENTANYL CITRATE (PF) 100 MCG/2ML IJ SOLN
50.0000 ug | Freq: Once | INTRAMUSCULAR | Status: AC
  Administered 2018-06-28: 50 ug via INTRAVENOUS
  Filled 2018-06-28: qty 2

## 2018-06-28 MED ORDER — SODIUM CHLORIDE 0.9% FLUSH
3.0000 mL | Freq: Two times a day (BID) | INTRAVENOUS | Status: DC
  Administered 2018-06-28 – 2018-07-12 (×21): 3 mL via INTRAVENOUS

## 2018-06-28 MED ORDER — LORATADINE 10 MG PO TABS: 10.0000 mg | ORAL_TABLET | Freq: Every day | ORAL | Status: DC | PRN

## 2018-06-28 MED ORDER — ONDANSETRON HCL 4 MG/2ML IJ SOLN: 4.0000 mg | Freq: Four times a day (QID) | INTRAMUSCULAR | Status: DC | PRN

## 2018-06-28 MED ORDER — ATENOLOL 25 MG PO TABS
50.0000 mg | ORAL_TABLET | Freq: Every day | ORAL | Status: DC
  Administered 2018-06-28 – 2018-07-04 (×6): 50 mg via ORAL
  Filled 2018-06-28: qty 2
  Filled 2018-06-28 (×6): qty 1
  Filled 2018-06-28: qty 2
  Filled 2018-06-28: qty 1

## 2018-06-28 MED ORDER — SODIUM CHLORIDE 0.9% FLUSH: 3.0000 mL | INTRAVENOUS | Status: DC | PRN

## 2018-06-28 MED ORDER — HEPARIN (PORCINE) 25000 UT/250ML-% IV SOLN
1000.0000 [IU]/h | INTRAVENOUS | Status: DC
  Administered 2018-06-28: 1200 [IU]/h via INTRAVENOUS
  Administered 2018-06-29: 1000 [IU]/h via INTRAVENOUS
  Administered 2018-07-01 – 2018-07-03 (×4): 1200 [IU]/h via INTRAVENOUS
  Administered 2018-07-04: 1100 [IU]/h via INTRAVENOUS
  Administered 2018-07-05: 1000 [IU]/h via INTRAVENOUS
  Administered 2018-07-07 – 2018-07-09 (×3): 850 [IU]/h via INTRAVENOUS
  Filled 2018-06-28 (×13): qty 250

## 2018-06-28 MED ORDER — ONDANSETRON HCL 4 MG PO TABS: 4.0000 mg | ORAL_TABLET | Freq: Four times a day (QID) | ORAL | Status: DC | PRN

## 2018-06-28 MED ORDER — PREDNISONE 20 MG PO TABS: 50.0000 mg | ORAL_TABLET | Freq: Every day | ORAL | Status: DC

## 2018-06-28 MED ORDER — ACETAMINOPHEN 650 MG RE SUPP: 650.0000 mg | Freq: Four times a day (QID) | RECTAL | Status: DC | PRN

## 2018-06-28 MED ORDER — SODIUM CHLORIDE 0.9 % IV BOLUS
1000.0000 mL | Freq: Once | INTRAVENOUS | Status: AC
  Administered 2018-06-28: 1000 mL via INTRAVENOUS

## 2018-06-28 MED ORDER — OMEGA-3-ACID ETHYL ESTERS 1 G PO CAPS
1.0000 g | ORAL_CAPSULE | Freq: Every day | ORAL | Status: DC
  Administered 2018-06-29 – 2018-07-10 (×10): 1 g via ORAL
  Filled 2018-06-28 (×10): qty 1

## 2018-06-28 MED ORDER — CLOPIDOGREL BISULFATE 75 MG PO TABS
75.0000 mg | ORAL_TABLET | Freq: Every day | ORAL | Status: DC
  Administered 2018-06-29 – 2018-07-10 (×11): 75 mg via ORAL
  Filled 2018-06-28 (×11): qty 1

## 2018-06-28 MED ORDER — ATORVASTATIN CALCIUM 10 MG PO TABS
10.0000 mg | ORAL_TABLET | Freq: Every day | ORAL | Status: DC
  Administered 2018-06-28 – 2018-07-01 (×3): 10 mg via ORAL
  Filled 2018-06-28 (×3): qty 1

## 2018-06-28 MED ORDER — PREDNISONE 20 MG PO TABS
50.0000 mg | ORAL_TABLET | Freq: Every day | ORAL | Status: DC
  Filled 2018-06-28: qty 1

## 2018-06-28 MED ORDER — ONDANSETRON HCL 4 MG/2ML IJ SOLN
4.0000 mg | Freq: Once | INTRAMUSCULAR | Status: AC
  Administered 2018-06-28: 4 mg via INTRAVENOUS
  Filled 2018-06-28: qty 2

## 2018-06-28 MED ORDER — ACETAMINOPHEN 325 MG PO TABS
650.0000 mg | ORAL_TABLET | Freq: Four times a day (QID) | ORAL | Status: DC | PRN
  Administered 2018-06-29 – 2018-07-07 (×4): 650 mg via ORAL
  Filled 2018-06-28 (×5): qty 2

## 2018-06-28 MED ORDER — ALBUTEROL SULFATE (2.5 MG/3ML) 0.083% IN NEBU
2.5000 mg | INHALATION_SOLUTION | Freq: Four times a day (QID) | RESPIRATORY_TRACT | Status: DC | PRN
  Administered 2018-06-30 – 2018-07-02 (×3): 2.5 mg via RESPIRATORY_TRACT
  Filled 2018-06-28 (×4): qty 3

## 2018-06-28 MED ORDER — VITAMIN D 25 MCG (1000 UNIT) PO TABS
5000.0000 [IU] | ORAL_TABLET | Freq: Every day | ORAL | Status: DC
  Administered 2018-06-28 – 2018-07-10 (×11): 5000 [IU] via ORAL
  Filled 2018-06-28 (×20): qty 5

## 2018-06-28 MED ORDER — PREDNISONE 50 MG PO TABS
50.0000 mg | ORAL_TABLET | Freq: Every day | ORAL | Status: DC
  Administered 2018-06-29 – 2018-06-30 (×2): 50 mg via ORAL
  Filled 2018-06-28 (×2): qty 1

## 2018-06-28 MED ORDER — SODIUM CHLORIDE 0.9 % IV SOLN: 250.0000 mL | INTRAVENOUS | Status: DC | PRN

## 2018-06-28 NOTE — ED Triage Notes (Signed)
Pt to the ER for a fall and a rash. Original EMS call out was for an assist out of the floor. Pt has a red rash down the front of the leg to the foot with pain to the knee on the lateral side down to the foot. Pt has pain with movement and touch. Pt has a hx of blood clots. Per family PO intake has decreased over the last few days. Pt has a hx of dementia but is alert and oriented at this time. Per EMS leg temp is decreased below the knee on the left.

## 2018-06-28 NOTE — ED Provider Notes (Signed)
[facility] Emergency Department Provider Note  ____________________________________________  Time seen: Approximately 4:28 PM  I have reviewed the triage vital signs and the nursing notes.   HISTORY  Chief Complaint Fall   HPI [name] [name] is a 84 y.o. male with a history of peripheral vascular disease status post SFA/popliteal stent on Eliquis, chronic kidney disease, hypertension, hyperlipidemia, DVT/PE who presents for evaluation of left leg pain.  For the last 2 days patient has been complaining of severe pain in his left leg.  Patient has slight dementia and is unable to fully describe the pain.  He complains that the pain is severe, has been unable to ambulate due to the severity, the pain is sharp.  Patient also has a purpuric non blanching rash overlying the anterior aspect of the left lower extremity and dorsum of the foot.  According to the family the rash has been there for 2 days as well.  Patient was last able to ambulate 2 days ago.  Last night he was trying to get up from the bed when he fell.  Family was unable to assist him back to the bed.  Patient spent the night on the ground at this morning they called 911 for him to be transported to the emergency room.  No head trauma or LOC.  Patient denies any other pain.   Past Medical History:  Diagnosis Date  . Arthritis   . CKD (chronic kidney disease) stage 3, GFR 30-59 ml/min (HCC) 12/29/2015  . Dry gangrene (HCC) 06/16/2017   Great toe and 5th toe LEFT foot; Dr. Wyn Quaker  . Hyperlipidemia   . Hypertension   . Left leg DVT (HCC) 01/2014  . Myocardial infarction (HCC)   . Pulmonary embolism (HCC) 01/2014  . Thoracic aorta atherosclerosis (HCC) 10/08/2016    Patient Active Problem List   Diagnosis Date Noted  . Poor posture 03/24/2018  . History of rectal bleeding 01/09/2018  . Peripheral vascular disease (HCC) 08/14/2017  . Left anterior knee pain 08/14/2017  . Mild dementia (HCC) 08/06/2017    . Cognitive impairment 07/21/2017  . Cyclic citrullinated peptide (CCP) antibody positive 06/18/2017  . Rheumatoid factor positive 06/18/2017  . Dry gangrene (HCC) 06/16/2017  . Atherosclerosis of native arteries of the extremities with gangrene (HCC) 06/13/2017  . CAD (coronary artery disease), native coronary artery 05/24/2017  . Arterial occlusion 05/23/2017  . Osteoarthritis of knee 05/09/2017  . Mild cognitive impairment 11/27/2016  . Mood disorder (HCC) 11/27/2016  . Vitamin B12 deficiency 11/27/2016  . Dermatitis 10/08/2016  . Thoracic aorta atherosclerosis (HCC) 10/08/2016  . CKD (chronic kidney disease) stage 3, GFR 30-59 ml/min (HCC) 12/29/2015  . Anemia 12/29/2015  . Medication monitoring encounter 12/29/2015  . Presbycusis 12/29/2015  . Cough 07/05/2015  . History of anticoagulant therapy 01/16/2015  . History of pulmonary embolus (PE) 01/16/2015  . Glaucoma 01/16/2015  . H/O malignant neoplasm of skin 01/16/2015  . Mixed hyperlipidemia 01/16/2015  . Essential hypertension 01/16/2015  . 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 Calculus of kidney 01/16/2015  . Personal history of DVT (deep vein thrombosis) 01/16/2015  . Presence of stent in coronary artery 01/16/2015  . Pruritic dermatitis 01/16/2015    Past Surgical History:  Procedure Laterality Date  . COLONOSCOPY    . CORONARY STENT PLACEMENT  2006  . JOINT REPLACEMENT  2011 & 2012   both hips  . len replaced Left 2015  . LOWER  EXTREMITY ANGIOGRAPHY N/A 05/26/2017   Procedure: Lower Extremity Angiography;  Surgeon: Annice Needyew, Jason S, MD;  Location: [facility] INVASIVE CV LAB;  Service: Cardiovascular;  Laterality: N/A;  . LOWER EXTREMITY ANGIOGRAPHY Left 11/10/2017   Procedure: LOWER EXTREMITY ANGIOGRAPHY;  Surgeon: Annice Needyew, Jason S, MD;  Location: [facility] INVASIVE CV LAB;  Service: Cardiovascular;  Laterality: Left;  Marland Kitchen. MELANOMA EXCISION     40 years ago    Prior to Admission  medications   Medication Sig Start Date End Date Taking? Authorizing Provider  atenolol (TENORMIN) 50 MG tablet TAKE 1 TABLET (50 MG TOTAL) BY MOUTH DAILY. 05/25/18  Yes Lada, Janit BernMelinda P, MD  atorvastatin (LIPITOR) 10 MG tablet TAKE 1 TABLET BY MOUTH EVERYDAY AT BEDTIME 05/06/18  Yes Poulose, Percell BeltElizabeth E, NP  Cholecalciferol (VITAMIN D-1000 MAX ST) 1000 units tablet Take 5 tablets by mouth daily.   Yes [provider]  clopidogrel (PLAVIX) 75 MG tablet Take 1 tablet (75 mg total) by mouth daily. Use INSTEAD of Eliquis 06/22/18  Yes Lada, Janit BernMelinda P, MD  donepezil (ARICEPT) 5 MG tablet Take 1 tablet by mouth every evening. 04/27/17  Yes [provider]  loratadine (CLARITIN) 10 MG tablet Take 1 tablet (10 mg total) by mouth daily as needed for allergies. 11/19/16  Yes Lada, Janit BernMelinda P, MD  Omega-3 Fatty Acids (FISH OIL PO) Take 1 capsule by mouth 2 (two) times daily.   Yes [provider]  triamcinolone lotion (KENALOG) 0.1 % Apply 1 application topically 3 (three) times daily. 06/18/18  Yes Lada, Janit BernMelinda P, MD  vitamin B-12 (CYANOCOBALAMIN) 1000 MCG tablet Take 1 tablet by mouth daily.   Yes [provider]  albuterol (PROVENTIL HFA;VENTOLIN HFA) 108 (90 Base) MCG/ACT inhaler Inhale 2 puffs into the lungs every 4 (four) hours as needed for wheezing or shortness of breath. Patient not taking: Reported on 06/28/2018 11/19/16   Kerman PasseyLada, Melinda P, MD    Allergies Aspirin; Bee pollen; Pollen extract; and Rabbit epithelium  Family History  Problem Relation Age of Onset  . Arthritis Mother   . Arthritis Father   . Depression Father   . Hypertension Daughter   . Pulmonary Hypertension Daughter     Social History Social History   Tobacco Use  . Smoking status: Former Smoker    Types: Pipe    Last attempt to quit: 1970    Years since quitting: 50.0  . Smokeless tobacco: Never Used  Substance Use Topics  . Alcohol use: No    Alcohol/week: 0.0 standard drinks  .  Drug use: No    Review of Systems  Constitutional: Negative for fever. Eyes: Negative for visual changes. ENT: Negative for sore throat. Neck: No neck pain  Cardiovascular: Negative for chest pain. Respiratory: Negative for shortness of breath. Gastrointestinal: Negative for abdominal pain, vomiting or diarrhea. Genitourinary: Negative for dysuria. Musculoskeletal: Negative for back pain. + LLE pain and rash Skin: Negative for rash. Neurological: Negative for headaches, weakness or numbness. Psych: No SI or HI  ____________________________________________   PHYSICAL EXAM:  VITAL SIGNS: ED Triage Vitals [06/28/18 1553]  Enc Vitals Group     BP (!) 153/50     Pulse Rate 78     Resp 19     Temp 68F     Temp src      SpO2 100 %     Weight 152 lb (68.9 kg)     Height 5\' 7"  (1.702 m)     Head Circumference  Peak Flow      Pain Score 10     Pain Loc      Pain Edu?      Excl. in GC?     Constitutional: Alert and oriented, no distress.  HEENT:      Head: Normocephalic and atraumatic.         Eyes: Conjunctivae are normal. Sclera is non-icteric.       Mouth/Throat: Mucous membranes are moist.       Neck: Supple with no signs of meningismus. Cardiovascular: Regular rate and rhythm. No murmurs, gallops, or rubs. 2+ symmetrical distal pulses are present in all extremities. No JVD. Respiratory: Normal respiratory effort. Lungs are clear to auscultation bilaterally. No wheezes, crackles, or rhonchi.  Gastrointestinal: Soft, non tender, and non distended with positive bowel sounds. No rebound or guarding. Musculoskeletal: LLE has a large purpuric rash that extends from the knee all the way to the toes mostly on the anterior aspect, extremities cool to the touch, foot is slightly cyanotic with delayed capillary refill.  No palpable or dopplerable PT DP or popliteal pulses.  No crepitus.  Patient has significant pain with minimal palpation of the extremity.  Strong palpable  femoral pulse Neurologic: Normal speech and language. Face is symmetric. Moving all extremities. No gross focal neurologic deficits are appreciated. Skin: Skin is warm, dry and intact. No rash noted. Psychiatric: Mood and affect are normal. Speech and behavior are normal.  ____________________________________________   LABS (all labs ordered are listed, but only abnormal results are displayed)  Labs Reviewed  CBC WITH DIFFERENTIAL/PLATELET - Abnormal; Notable for the following components:      Result Value   WBC 13.6 (*)    RBC 4.18 (*)    Hemoglobin 9.9 (*)    HCT 33.0 (*)    MCV 78.9 (*)    MCH 23.7 (*)    RDW 15.9 (*)    Neutro Abs 10.8 (*)    Monocytes Absolute 1.3 (*)    All other components within normal limits  COMPREHENSIVE METABOLIC PANEL - Abnormal; Notable for the following components:   Glucose, Bld 116 (*)    BUN 31 (*)    AST 50 (*)    GFR calc non Af Amer 56 (*)    All other components within normal limits  CK - Abnormal; Notable for the following components:   Total CK 1,142 (*)    All other components within normal limits  CG4 I-STAT (LACTIC ACID) - Abnormal; Notable for the following components:   Lactic Acid, Venous 2.16 (*)    All other components within normal limits  CULTURE, BLOOD (ROUTINE X 2)  CULTURE, BLOOD (ROUTINE X 2)  PROTIME-INR  APTT  I-STAT CG4 LACTIC ACID, ED   ____________________________________________  EKG  ED ECG REPORT I, Nita Sickle, the attending physician, personally viewed and interpreted this ECG.  Atrial flutter, rate of 77, no ST elevations or depressions.New when compared to prior. ____________________________________________  RADIOLOGY  none  ____________________________________________   PROCEDURES  Procedure(s) performed: None Procedures Critical Care performed: yes  CRITICAL CARE Performed by: Nita Sickle  ?  Total critical care time: 40 min  Critical care time was exclusive of  separately billable procedures and treating other patients.  Critical care was necessary to treat or prevent imminent or life-threatening deterioration.  Critical care was time spent personally by me on the following activities: development of treatment plan with patient and/or surrogate as well as nursing, discussions with consultants, evaluation of  patient's response to treatment, examination of patient, obtaining history from patient or surrogate, ordering and performing treatments and interventions, ordering and review of laboratory studies, ordering and review of radiographic studies, pulse oximetry and re-evaluation of patient's condition.  ____________________________________________   INITIAL IMPRESSION / ASSESSMENT AND PLAN / ED COURSE  84 y.o. male with a history of peripheral vascular disease status post SFA/popliteal stent on Eliquis, chronic kidney disease, hypertension, hyperlipidemia, DVT/PE who presents for evaluation of left leg pain.  Clinical picture consistent with acute ischemic limb with cold, cyanotic extremity from the knee down, delayed capillary refill, purpuric nonblanching rash and severe tenderness to palpation.  No palpable or dopplerable PT, DP or popliteal pulses are present. Patient will be started on heparin. Vascular surgery paged for intervention. Labs pending.     _________________________ 4:43 PM on 06/28/2018 -----------------------------------------  Discussed with Dr. Evie LacksEsco from vascular surgery who will evaluate patient emergency room for possible emergent versus urgent intervention since symptoms seem to be ongoing for at least 48 hours.  She recommended started patient on heparin which has been ordered already.  She recommended getting patient admitted to the hospitalist service.  Will consult hospitalist.  _________________________ 5:46 PM on 06/28/2018 -----------------------------------------  Labs consistent with rhabdo, kidney functions within  normal limits.  Patient receiving IV fluids.  Patient now has dopplerable popliteal pulse after being started on heparin.  Dr. Evie LacksEsco at the bedside evaluating patient.  Recommended continue heparin but no surgical intervention at this time.  Will talk to the hospitalist for admission.   As part of my medical decision making, I reviewed the following data within the electronic MEDICAL RECORD NUMBER Nursing notes reviewed and incorporated, Labs reviewed , EKG interpreted , Old EKG reviewed, Old chart reviewed, Discussed with admitting physician , A consult was requested and obtained from this/these consultant(s) Vascular surgery, Notes from prior ED visits and [place] Controlled Substance Database    Pertinent labs & imaging results that were available during my care of the patient were reviewed by me and considered in my medical decision making (see chart for details).    ____________________________________________   FINAL CLINICAL IMPRESSION(S) / ED DIAGNOSES  Final diagnoses:  Ischemic leg  Non-traumatic rhabdomyolysis      NEW MEDICATIONS STARTED DURING THIS VISIT:  ED Discharge Orders    None       Note:  This document was prepared using Dragon voice recognition software and may include unintentional dictation errors.    Nita SickleVeronese, [place], MD 06/28/18 [phone]

## 2018-06-28 NOTE — ED Notes (Signed)
EDP in with patient 

## 2018-06-28 NOTE — H&P (Signed)
[facility] - [place] [facility]   PATIENT NAME: [name] [name] Stick    MR#:  [id number]  DATE OF BIRTH:  [birth year]  DATE OF ADMISSION:  06/28/2018  PRIMARY CARE PHYSICIAN: Kerman PasseyLada, Melinda P, MD   REQUESTING/REFERRING PHYSICIAN: Nita SickleVeronese, [place], MD  CHIEF COMPLAINT:   Chief Complaint  Patient presents with  . Fall    HISTORY OF PRESENT ILLNESS: [name] [name] [place]  is a 84 y.o. male with a known history of chronic kidney disease, peripheral vascular disease, hypertension, hyperlipidemia, coronary artery disease, history of pulmonary embolism who is presenting with left-sided leg pain going down from his knee.  Patient states that he started having severe pain 2 days ago.  And he had trouble walking.  L last night his pain was so severe that he could not ambulate.  Patient yesterday was sitting in a chair and then slid down and could not get up.  His wife was unable to help him up.  She states that he laid on the ground all night.  Patient also recently has had a rash throughout his body.  Which is felt to be allergic.  According to his wife he was on Eliquis which was discontinued now he was placed on Plavix. She also states that he also has had intermittent wheezing that she could hear. In reviewing outpatient charts patient also has been noted to be anemic and was told to have guaiac cards to be taken to his primary care provider.  PAST MEDICAL HISTORY:   Past Medical History:  Diagnosis Date  . Arthritis   . CKD (chronic kidney disease) stage 3, GFR 30-59 ml/min (HCC) 12/29/2015  . Dry gangrene (HCC) 06/16/2017   Great toe and 5th toe LEFT foot; Dr. Wyn Quakerew  . Hyperlipidemia   . Hypertension   . Left leg DVT (HCC) 01/2014  . Myocardial infarction (HCC)   . Pulmonary embolism (HCC) 01/2014  . Thoracic aorta atherosclerosis (HCC) 10/08/2016    PAST SURGICAL HISTORY:  Past Surgical History:  Procedure Laterality Date  . COLONOSCOPY    . CORONARY STENT PLACEMENT  2006  .  JOINT REPLACEMENT  2011 & 2012   both hips  . len replaced Left 2015  . LOWER EXTREMITY ANGIOGRAPHY N/A 05/26/2017   Procedure: Lower Extremity Angiography;  Surgeon: Annice Needyew, Jason S, MD;  Location: [facility] INVASIVE CV LAB;  Service: Cardiovascular;  Laterality: N/A;  . LOWER EXTREMITY ANGIOGRAPHY Left 11/10/2017   Procedure: LOWER EXTREMITY ANGIOGRAPHY;  Surgeon: Annice Needyew, Jason S, MD;  Location: [facility] INVASIVE CV LAB;  Service: Cardiovascular;  Laterality: Left;  Marland Kitchen. MELANOMA EXCISION     40 years ago    SOCIAL HISTORY:  Social History   Tobacco Use  . Smoking status: Former Smoker    Types: Pipe    Last attempt to quit: 1970    Years since quitting: 50.0  . Smokeless tobacco: Never Used  Substance Use Topics  . Alcohol use: No    Alcohol/week: 0.0 standard drinks    FAMILY HISTORY:  Family History  Problem Relation Age of Onset  . Arthritis Mother   . Arthritis Father   . Depression Father   . Hypertension Daughter   . Pulmonary Hypertension Daughter     DRUG ALLERGIES:  Allergies  Allergen Reactions  . Aspirin Anaphylaxis, Swelling and Shortness Of Breath    REACTION: terrible bp reaction/wheezing  . Bee Pollen Other (See Comments)    ragweed  . Pollen Extract     ragweed  .  Rabbit Epithelium     rabbits    REVIEW OF SYSTEMS:   CONSTITUTIONAL: No fever, fatigue or weakness.  EYES: No blurred or double vision.  EARS, NOSE, AND THROAT: No tinnitus or ear pain.  RESPIRATORY: No cough, shortness of breath, wheezing or hemoptysis.  CARDIOVASCULAR: No chest pain, orthopnea, edema.  GASTROINTESTINAL: No nausea, vomiting, diarrhea or abdominal pain.  GENITOURINARY: No dysuria, hematuria.  ENDOCRINE: No polyuria, nocturia,  HEMATOLOGY: No anemia, easy bruising or bleeding SKIN: No rash or lesion. MUSCULOSKELETAL: Positive left lower extremity pain.   NEUROLOGIC: No tingling, numbness, weakness.  PSYCHIATRY: No anxiety or depression.   MEDICATIONS AT HOME:  Prior to  Admission medications   Medication Sig Start Date End Date Taking? Authorizing Provider  atenolol (TENORMIN) 50 MG tablet TAKE 1 TABLET (50 MG TOTAL) BY MOUTH DAILY. 05/25/18  Yes Lada, Janit BernMelinda P, MD  atorvastatin (LIPITOR) 10 MG tablet TAKE 1 TABLET BY MOUTH EVERYDAY AT BEDTIME 05/06/18  Yes Poulose, Percell BeltElizabeth E, NP  Cholecalciferol (VITAMIN D-1000 MAX ST) 1000 units tablet Take 5 tablets by mouth daily.   Yes [provider]  clopidogrel (PLAVIX) 75 MG tablet Take 1 tablet (75 mg total) by mouth daily. Use INSTEAD of Eliquis 06/22/18  Yes Lada, Janit BernMelinda P, MD  donepezil (ARICEPT) 5 MG tablet Take 1 tablet by mouth every evening. 04/27/17  Yes [provider]  loratadine (CLARITIN) 10 MG tablet Take 1 tablet (10 mg total) by mouth daily as needed for allergies. 11/19/16  Yes Lada, Janit BernMelinda P, MD  Omega-3 Fatty Acids (FISH OIL PO) Take 1 capsule by mouth 2 (two) times daily.   Yes [provider]  triamcinolone lotion (KENALOG) 0.1 % Apply 1 application topically 3 (three) times daily. 06/18/18  Yes Lada, Janit BernMelinda P, MD  vitamin B-12 (CYANOCOBALAMIN) 1000 MCG tablet Take 1 tablet by mouth daily.   Yes [provider]  albuterol (PROVENTIL HFA;VENTOLIN HFA) 108 (90 Base) MCG/ACT inhaler Inhale 2 puffs into the lungs every 4 (four) hours as needed for wheezing or shortness of breath. Patient not taking: Reported on 06/28/2018 11/19/16   Kerman PasseyLada, Melinda P, MD      PHYSICAL EXAMINATION:   VITAL SIGNS: Blood pressure (!) 153/50, pulse 78, resp. rate 19, height 5\' 7"  (1.702 m), weight 68.9 kg, SpO2 100 %.  GENERAL:  84 y.o.-year-old patient lying in the bed with no acute distress.  EYES: Pupils equal, round, reactive to light and accommodation. No scleral icterus. Extraocular muscles intact.  HEENT: Head atraumatic, normocephalic. Oropharynx and nasopharynx clear.  NECK:  Supple, no jugular venous distention. No thyroid enlargement, no tenderness.  LUNGS: Normal breath  sounds bilaterally, no wheezing, rales,rhonchi or crepitation. No use of accessory muscles of respiration.  CARDIOVASCULAR: S1, S2 normal. No murmurs, rubs, or gallops.  ABDOMEN: Soft, nontender, nondistended. Bowel sounds present. No organomegaly or mass.  EXTREMITIES: Diminished pulses in the left leg  NEUROLOGIC: Cranial nerves II through XII are intact. Muscle strength 5/5 in all extremities. Sensation intact. Gait not checked.  PSYCHIATRIC: The patient is alert and oriented x 3.  SKIN: Has a papular rash throughout his body  LABORATORY PANEL:   CBC Recent Labs  Lab 06/28/18 1650  WBC 13.6*  HGB 9.9*  HCT 33.0*  PLT 247  MCV 78.9*  MCH 23.7*  MCHC 30.0  RDW 15.9*  LYMPHSABS 1.4  MONOABS 1.3*  EOSABS 0.1  BASOSABS 0.0   ------------------------------------------------------------------------------------------------------------------  Chemistries  Recent Labs  Lab 06/28/18 1650  NA 142  K 4.0  CL 109  CO2 24  GLUCOSE 116*  BUN 31*  CREATININE 1.16  CALCIUM 9.2  AST 50*  ALT 27  ALKPHOS 65  BILITOT 0.5   ------------------------------------------------------------------------------------------------------------------ estimated creatinine clearance is 41.2 mL/min (by C-G formula based on SCr of 1.16 mg/dL). ------------------------------------------------------------------------------------------------------------------ No results for input(s): TSH, T4TOTAL, T3FREE, THYROIDAB in the last 72 hours.  Invalid input(s): FREET3   Coagulation profile Recent Labs  Lab 06/28/18 1650  INR 1.03   ------------------------------------------------------------------------------------------------------------------- No results for input(s): DDIMER in the last 72 hours. -------------------------------------------------------------------------------------------------------------------  Cardiac Enzymes No results for input(s): CKMB, TROPONINI, MYOGLOBIN in the last 168  hours.  Invalid input(s): CK ------------------------------------------------------------------------------------------------------------------ Invalid input(s): POCBNP  ---------------------------------------------------------------------------------------------------------------  Urinalysis    Component Value Date/Time   COLORURINE YELLOW (A) 06/20/2017 1230   APPEARANCEUR Clear 01/15/2018 1122   LABSPEC 1.024 06/20/2017 1230   PHURINE 5.0 06/20/2017 1230   GLUCOSEU Negative 01/15/2018 1122   HGBUR NEGATIVE 06/20/2017 1230   BILIRUBINUR Negative 01/15/2018 1122   KETONESUR NEGATIVE 06/20/2017 1230   PROTEINUR 1+ (A) 01/15/2018 1122   PROTEINUR NEGATIVE 06/20/2017 1230   UROBILINOGEN 0.2 09/19/2017 1007   UROBILINOGEN 1.0 02/14/2010 1358   NITRITE Negative 01/15/2018 1122   NITRITE NEGATIVE 06/20/2017 1230   LEUKOCYTESUR Negative 01/15/2018 1122     RADIOLOGY: No results found.  EKG: Orders placed or performed during the hospital encounter of 06/28/18  . ED EKG  . ED EKG    IMPRESSION AND PLAN: Patient is 84 year old with peripheral vascular disease with left lower extremity pain  1.  Left lower extremity pain due to ischemic left lower extremity Vascular has seen the patient patient is started on heparin drip He will need angiogram for definite treatment  2.  Diffuse rash could be medication induced recent changes in his medication I will give him dose of prednisone.  For 3 days.  May need outpatient follow-up with dermatology  3.  Anemia with dark stools will guaiac stools again hemoglobin is low but does not require transfusion We will do anemia work-up  4.  Hypertension continue atenolol  5.  Hyperlipidemia continue Lipitor  6.  Intermittent wheezing per patient's wife we will put him on nebulizers.     All the records are reviewed and case discussed with ED provider. Management plans discussed with the patient, family and they are in  agreement.  CODE STATUS: Code Status History    Date Active Date Inactive Code Status Order ID Comments User Context   11/10/2017 1217 11/10/2017 1717 Full Code [id number]  Annice Needy, MD Inpatient   05/24/2017 0015 05/28/2017 1728 Full Code [id number]  Ihor Austin, MD Inpatient       TOTAL TIME TAKING CARE OF THIS PATIENT: 55 minutes.    Auburn Bilberry M.D on 06/28/2018 at 5:54 PM  Between 7am to 6pm - Pager - [phone]  After 6pm go to [email or web address] - password [email or web address]  [facility] Office  [phone]  CC: Primary care physician; Kerman Passey, MD

## 2018-06-28 NOTE — Progress Notes (Signed)
ANTICOAGULATION CONSULT NOTE - Initial Consult  Pharmacy Consult for Heparin  Indication: arterial occlusion  Allergies  Allergen Reactions  . Aspirin Anaphylaxis, Swelling and Shortness Of Breath    REACTION: terrible bp reaction/wheezing  . Bee Pollen Other (See Comments)    ragweed  . Pollen Extract     ragweed  . Rabbit Epithelium     rabbits    Patient Measurements: Height: 5\' 7"  (170.2 cm) Weight: 152 lb (68.9 kg) IBW/kg (Calculated) : 66.1 Heparin Dosing Weight:  68.9 kg   Vital Signs: BP: 153/50 (12/29 1553) Pulse Rate: 78 (12/29 1553)  Labs: No results for input(s): HGB, HCT, PLT, APTT, LABPROT, INR, HEPARINUNFRC, HEPRLOWMOCWT, CREATININE, CKTOTAL, CKMB, TROPONINI in the last 72 hours.  Estimated Creatinine Clearance: 30.4 mL/min (A) (by C-G formula based on SCr of 1.57 mg/dL (H)).   Medical History: Past Medical History:  Diagnosis Date  . Arthritis   . CKD (chronic kidney disease) stage 3, GFR 30-59 ml/min (HCC) 12/29/2015  . Dry gangrene (HCC) 06/16/2017   Great toe and 5th toe LEFT foot; Dr. Wyn Quakerew  . Hyperlipidemia   . Hypertension   . Left leg DVT (HCC) 01/2014  . Myocardial infarction (HCC)   . Pulmonary embolism (HCC) 01/2014  . Thoracic aorta atherosclerosis (HCC) 10/08/2016    Medications:  (Not in a hospital admission)   Assessment: Pharmacy consulted to dose heparin in this 84 year old male with occluded artery.   No prior anticoag noted. CrCl = 30.4   Goal of Therapy:  Heparin level 0.3-0.7 units/ml Monitor platelets by anticoagulation protocol: Yes   Plan:  Give 4800 units bolus x 1 Start heparin infusion at 1200 units/hr Check anti-Xa level in 8 hours and daily while on heparin Continue to monitor H&H and platelets  Arwen Haseley D 06/28/2018,4:26 PM

## 2018-06-28 NOTE — Consult Note (Signed)
Reason for Consult:cool left foot Referring Physician: Dr. Bonita Quin is an 84 y.o. male.  HPI: Patient with dementia; PVD, history of LEFT SFA, Popliteal stenting in May 2019 per Dr. Wyn Quaker for gangrene of the left great toe. On Eliquis. The patient and wife are poor historians. However, it appears that he developed a diffuse body and lower extremity rash ~ 1 week ago. He reportedly developed left leg coolness and pain 48-72 hours ago. He fell yesterday and remained on the floor until this afternoon as he was not able to get up on his own. Upon initial evaluation in the ED; he had coolness and cyanosis of the left foot. Upon my exam he is without significant complaint. Minimal Left foot leg pain. Per wife/ED physician his foot has improved since presentation.  Past Medical History:  Diagnosis Date  . Arthritis   . CKD (chronic kidney disease) stage 3, GFR 30-59 ml/min (HCC) 12/29/2015  . Dry gangrene (HCC) 06/16/2017   Great toe and 5th toe LEFT foot; Dr. Wyn Quaker  . Hyperlipidemia   . Hypertension   . Left leg DVT (HCC) 01/2014  . Myocardial infarction (HCC)   . Pulmonary embolism (HCC) 01/2014  . Thoracic aorta atherosclerosis (HCC) 10/08/2016    Past Surgical History:  Procedure Laterality Date  . COLONOSCOPY    . CORONARY STENT PLACEMENT  2006  . JOINT REPLACEMENT  2011 & 2012   both hips  . len replaced Left 2015  . LOWER EXTREMITY ANGIOGRAPHY N/A 05/26/2017   Procedure: Lower Extremity Angiography;  Surgeon: Annice Needy, MD;  Location: [facility] INVASIVE CV LAB;  Service: Cardiovascular;  Laterality: N/A;  . LOWER EXTREMITY ANGIOGRAPHY Left 11/10/2017   Procedure: LOWER EXTREMITY ANGIOGRAPHY;  Surgeon: Annice Needy, MD;  Location: [facility] INVASIVE CV LAB;  Service: Cardiovascular;  Laterality: Left;  Marland Kitchen MELANOMA EXCISION     40 years ago    Family History  Problem Relation Age of Onset  . Arthritis Mother   . Arthritis Father   . Depression Father   . Hypertension Daughter    . Pulmonary Hypertension Daughter     Social History:  reports that he quit smoking about 50 years ago. His smoking use included pipe. He has never used smokeless tobacco. He reports that he does not drink alcohol or use drugs.  Allergies:  Allergies  Allergen Reactions  . Aspirin Anaphylaxis, Swelling and Shortness Of Breath    REACTION: terrible bp reaction/wheezing  . Bee Pollen Other (See Comments)    ragweed  . Pollen Extract     ragweed  . Rabbit Epithelium     rabbits    Medications: I have reviewed the patient's current medications.  Results for orders placed or performed during the hospital encounter of 06/28/18 (from the past 48 hour(s))  CBC with Differential/Platelet     Status: Abnormal   Collection Time: 06/28/18  4:50 PM  Result Value Ref Range   WBC 13.6 (H) 4.0 - 10.5 K/uL   RBC 4.18 (L) 4.22 - 5.81 MIL/uL   Hemoglobin 9.9 (L) 13.0 - 17.0 g/dL   HCT 78.2 (L) 95.6 - 21.3 %   MCV 78.9 (L) 80.0 - 100.0 fL   MCH 23.7 (L) 26.0 - 34.0 pg   MCHC 30.0 30.0 - 36.0 g/dL   RDW 08.6 (H) 57.8 - 46.9 %   Platelets 247 150 - 400 K/uL   nRBC 0.0 0.0 - 0.2 %   Neutrophils Relative % 80 %  Neutro Abs 10.8 (H) 1.7 - 7.7 K/uL   Lymphocytes Relative 10 %   Lymphs Abs 1.4 0.7 - 4.0 K/uL   Monocytes Relative 9 %   Monocytes Absolute 1.3 (H) 0.1 - 1.0 K/uL   Eosinophils Relative 1 %   Eosinophils Absolute 0.1 0.0 - 0.5 K/uL   Basophils Relative 0 %   Basophils Absolute 0.0 0.0 - 0.1 K/uL   Immature Granulocytes 0 %   Abs Immature Granulocytes 0.05 0.00 - 0.07 K/uL    Comment: Performed at [facility], [address] [place], [place] [address]  Protime-INR     Status: None   Collection Time: 06/28/18  4:50 PM  Result Value Ref Range   Prothrombin Time 13.4 11.4 - 15.2 seconds   INR 1.03     Comment: Performed at [facility], [address] [place], [place] [address]  APTT     Status: None   Collection Time: 06/28/18  4:50 PM  Result Value Ref  Range   aPTT 29 24 - 36 seconds    Comment: Performed at [facility], [address] [place], [place] [address]  Comprehensive metabolic panel     Status: Abnormal   Collection Time: 06/28/18  4:50 PM  Result Value Ref Range   Sodium 142 135 - 145 mmol/L   Potassium 4.0 3.5 - 5.1 mmol/L   Chloride 109 98 - 111 mmol/L   CO2 24 22 - 32 mmol/L   Glucose, Bld 116 (H) 70 - 99 mg/dL   BUN 31 (H) 8 - 23 mg/dL   Creatinine, Ser 9.141.16 0.61 - 1.24 mg/dL   Calcium 9.2 8.9 - 78.210.3 mg/dL   Total Protein 7.1 6.5 - 8.1 g/dL   Albumin 3.7 3.5 - 5.0 g/dL   AST 50 (H) 15 - 41 U/L   ALT 27 0 - 44 U/L   Alkaline Phosphatase 65 38 - 126 U/L   Total Bilirubin 0.5 0.3 - 1.2 mg/dL   GFR calc non Af Amer 56 (L) >60 mL/min   GFR calc Af Amer >60 >60 mL/min   Anion gap 9 5 - 15    Comment: Performed at [facility], [address] [place], [place] [address]  CK     Status: Abnormal   Collection Time: 06/28/18  4:50 PM  Result Value Ref Range   Total CK 1,142 (H) 49 - 397 U/L    Comment: Performed at [facility], [address] [place], [place] [address]  CG4 I-STAT (Lactic acid)     Status: Abnormal   Collection Time: 06/28/18  4:59 PM  Result Value Ref Range   Lactic Acid, Venous 2.16 (HH) 0.5 - 1.9 mmol/L   Comment NOTIFIED PHYSICIAN     No results found.  Review of Systems  Constitutional: Negative for fever.  Respiratory: Negative for cough and shortness of breath.   Cardiovascular: Negative for chest pain.  Musculoskeletal: Positive for falls.  Skin: Positive for itching and rash.   Blood pressure (!) 153/50, pulse 78, resp. rate 19, height 5\' 7"  (1.702 m), weight 68.9 kg, SpO2 100 %. Physical Exam  Nursing note and vitals reviewed. Constitutional: He appears well-developed.  Neck: Normal range of motion.  Cardiovascular: Normal rate.  Palpable bilateral femoral pulses. +Popliteal bilaterally LEFT monophasic PT. +motor, +sensory  Respiratory: Effort  normal. No respiratory distress. He has no wheezes.  GI: Soft. He exhibits no distension. There is no abdominal tenderness.  Musculoskeletal:  General: No edema.     Comments: Significant rash over bilateral lower extremities with erythema, sloughing  Neurological: He is alert.  dementia  Skin: Skin is warm and dry. Rash noted.    Assessment/Plan: No evidence of gross ischemia; stents are patent based upon exam; monophasic PT as noted in last visit. Continue Heparin gtt for now. Serial Vascular exams. Will follow however, no Vascular Surgery intervention recommended at this time.  Will need full workup for extensive rash. Would obtain ECHO Rhabdomyolysis- IV fluids, monitor renal function, electrolytes.  Ryder Man A 06/28/2018, 6:03 PM

## 2018-06-28 NOTE — Progress Notes (Signed)
Advanced care plan.  Purpose of the Encounter: CODE STATUS  Parties in Attendance: Patient himself and wife  Patient's Decision Capacity: Intact  Subjective/Patient's story: [name] [name]  is a 84 y.o. male with a known history of chronic kidney disease, peripheral vascular disease, hypertension, hyperlipidemia, coronary artery disease, history of pulmonary embolism who is presenting with left-sided leg pain going down from his knee.  Patient is noted to have ischemic leg.  Is been seen by vascular heparin is recommended   Objective/Medical story I discussed with the patient and his wife regarding his desires for cardiac and pulmonary resuscitation.   Goals of care determination:   Patient states that he would like to be intubated and have cardiac resuscitation  CODE STATUS: Full code   Time spent discussing advanced care planning: 16 minutes

## 2018-06-29 ENCOUNTER — Other Ambulatory Visit (INDEPENDENT_AMBULATORY_CARE_PROVIDER_SITE_OTHER): Payer: Self-pay | Admitting: Nurse Practitioner

## 2018-06-29 DIAGNOSIS — M6282 Rhabdomyolysis: Secondary | ICD-10-CM

## 2018-06-29 DIAGNOSIS — I998 Other disorder of circulatory system: Secondary | ICD-10-CM

## 2018-06-29 DIAGNOSIS — I739 Peripheral vascular disease, unspecified: Secondary | ICD-10-CM

## 2018-06-29 LAB — BASIC METABOLIC PANEL
Anion gap: 5 (ref 5–15)
BUN: 26 mg/dL — ABNORMAL HIGH (ref 8–23)
CO2: 25 mmol/L (ref 22–32)
Calcium: 8.7 mg/dL — ABNORMAL LOW (ref 8.9–10.3)
Chloride: 112 mmol/L — ABNORMAL HIGH (ref 98–111)
Creatinine, Ser: 1.09 mg/dL (ref 0.61–1.24)
GFR calc Af Amer: >60 mL/min (ref >60)
GFR calc non Af Amer: >60 mL/min (ref >60)
Glucose, Bld: 122 mg/dL — ABNORMAL HIGH (ref 70–99)
Potassium: 4.2 mmol/L (ref 3.5–5.1)
Sodium: 142 mmol/L (ref 135–145)

## 2018-06-29 LAB — CBC
HCT: 32.7 % — ABNORMAL LOW (ref 39.0–52.0)
Hemoglobin: 9.7 g/dL — ABNORMAL LOW (ref 13.0–17.0)
MCH: 23.5 pg — ABNORMAL LOW (ref 26.0–34.0)
MCHC: 29.7 g/dL — ABNORMAL LOW (ref 30.0–36.0)
MCV: 79.4 fL — ABNORMAL LOW (ref 80.0–100.0)
Platelets: 229 10*3/uL (ref 150–400)
RBC: 4.12 MIL/uL — ABNORMAL LOW (ref 4.22–5.81)
RDW: 15.5 % (ref 11.5–15.5)
WBC: 14.6 10*3/uL — AB (ref 4.0–10.5)
nRBC: 0 % (ref 0.0–0.2)

## 2018-06-29 LAB — HEPARIN LEVEL (UNFRACTIONATED)
Heparin Unfractionated: 0.97 IU/mL — ABNORMAL HIGH (ref 0.30–0.70)
Heparin Unfractionated: 0.73 IU/mL — ABNORMAL HIGH (ref 0.30–0.70)
Heparin Unfractionated: 0.43 IU/mL (ref 0.30–0.70)

## 2018-06-29 LAB — LACTIC ACID, PLASMA: Lactic Acid, Venous: 1.2 mmol/L (ref 0.5–1.9)

## 2018-06-29 LAB — CK: Total CK: 1085 U/L — ABNORMAL HIGH (ref 49–397)

## 2018-06-29 MED ORDER — OXYCODONE HCL 5 MG PO TABS
5.0000 mg | ORAL_TABLET | Freq: Four times a day (QID) | ORAL | Status: DC | PRN
  Administered 2018-06-29 – 2018-07-10 (×7): 5 mg via ORAL
  Filled 2018-06-29 (×7): qty 1

## 2018-06-29 MED ORDER — SODIUM CHLORIDE 0.9 % IV SOLN
INTRAVENOUS | Status: DC
  Administered 2018-06-29 – 2018-07-04 (×7): via INTRAVENOUS

## 2018-06-29 NOTE — Progress Notes (Addendum)
[facility] [place] [facility]   PATIENT NAME: [name] [name]    MR#:  [id number]  DATE OF BIRTH:  28-Jun-1928  SUBJECTIVE:  CHIEF COMPLAINT:   Chief Complaint  Patient presents with  . Fall   Pain LLE still present. On heparin drip  REVIEW OF SYSTEMS:    Review of Systems  Constitutional: Positive for malaise/fatigue. Negative for chills and fever.  HENT: Negative for sore throat.   Eyes: Negative for blurred vision, double vision and pain.  Respiratory: Negative for cough, hemoptysis, shortness of breath and wheezing.   Cardiovascular: Negative for chest pain, palpitations, orthopnea and leg swelling.  Gastrointestinal: Negative for abdominal pain, constipation, diarrhea, heartburn, nausea and vomiting.  Genitourinary: Negative for dysuria and hematuria.  Musculoskeletal: Negative for back pain and joint pain.  Skin: Negative for rash.  Neurological: Negative for sensory change, speech change, focal weakness and headaches.  Endo/Heme/Allergies: Does not bruise/bleed easily.  Psychiatric/Behavioral: Negative for depression. The patient is not nervous/anxious.     DRUG ALLERGIES:   Allergies  Allergen Reactions  . Aspirin Anaphylaxis, Swelling and Shortness Of Breath    REACTION: terrible bp reaction/wheezing  . Bee Pollen Other (See Comments)    ragweed  . Pollen Extract     ragweed  . Rabbit Epithelium     rabbits    VITALS:  Blood pressure (!) 121/54, pulse 68, temperature 98 F (36.7 C), temperature source Oral, resp. rate 20, height 5\' 7"  (1.702 m), weight 68.9 kg, SpO2 99 %.  PHYSICAL EXAMINATION:   Physical Exam  GENERAL:  84 y.o.-year-old patient lying in the bed with no acute distress.  EYES: Pupils equal, round, reactive to light and accommodation. No scleral icterus. Extraocular muscles intact.  HEENT: Head atraumatic, normocephalic. Oropharynx and nasopharynx clear.  NECK:  Supple, no jugular venous distention. No thyroid  enlargement, no tenderness.  LUNGS: Normal breath sounds bilaterally, no wheezing, rales, rhonchi. No use of accessory muscles of respiration.  CARDIOVASCULAR: S1, S2 normal. No murmurs, rubs, or gallops.  ABDOMEN: Soft, nontender, nondistended. Bowel sounds present. No organomegaly or mass.  EXTREMITIES: Left leg cold to touch with petechiae NEUROLOGIC: Cranial nerves II through XII are intact. No focal Motor or sensory deficits b/l.   PSYCHIATRIC: The patient is alert and oriented x 3.  SKIN: No obvious rash, lesion, or ulcer.   LABORATORY PANEL:   CBC Recent Labs  Lab 06/29/18 0100  WBC 14.6*  HGB 9.7*  HCT 32.7*  PLT 229   ------------------------------------------------------------------------------------------------------------------ Chemistries  Recent Labs  Lab 06/28/18 1650 06/29/18 0100  NA 142 142  K 4.0 4.2  CL 109 112*  CO2 24 25  GLUCOSE 116* 122*  BUN 31* 26*  CREATININE 1.16 1.09  CALCIUM 9.2 8.7*  AST 50*  --   ALT 27  --   ALKPHOS 65  --   BILITOT 0.5  --    ------------------------------------------------------------------------------------------------------------------  Cardiac Enzymes No results for input(s): TROPONINI in the last 168 hours. ------------------------------------------------------------------------------------------------------------------  RADIOLOGY:  No results found.   ASSESSMENT AND PLAN:   Patient is 84 year old with peripheral vascular disease with left lower extremity pain  *  left lower extremity ischemia Acute onset Evaluated by vascular surgery.  Heparin drip started.  Waiting for further input. Pain medications as needed He will need angiogram for definite treatment  * Acute rhabdomyolysis Improving  * Rash resolved  *  Anemia of chronic disease. Stable  *  Hypertension continue atenolol  *  Hyperlipidemia  continue Lipitor  *  Intermittent wheezing   All the records are reviewed and case  discussed with Care Management/Social Worker Management plans discussed with the patient, family and they are in agreement.  CODE STATUS: FULL CODE  DVT Prophylaxis: SCDs  TOTAL TIME TAKING CARE OF THIS PATIENT: 35 minutes.   POSSIBLE D/C IN 2-3 DAYS, DEPENDING ON CLINICAL CONDITION.  Molinda BailiffSrikar R Floyd Wade M.D on 06/29/2018 at 12:06 PM  Between 7am to 6pm - Pager - [phone]  After 6pm go to [email or web address] - password [email or web address] [facility]  [facility] [place] [facility]  Office  [phone]  CC: Primary care physician; Kerman PasseyLada, Melinda P, MD  Note: This dictation was prepared with Dragon dictation along with smaller phrase technology. Any transcriptional errors that result from this process are unintentional.

## 2018-06-29 NOTE — Progress Notes (Signed)
ANTICOAGULATION CONSULT NOTE  Pharmacy Consult for Heparin  Indication: arterial occlusion  Patient Measurements: Height: 5\' 7"  (170.2 cm) Weight: 152 lb (68.9 kg) IBW/kg (Calculated) : 66.1 Heparin Dosing Weight:  68.9 kg   Vital Signs: Temp: 98 F (36.7 C) (12/30 0437) Temp Source: Oral (12/30 0437) BP: 145/51 (12/30 0437) Pulse Rate: 71 (12/30 0437)  Labs: Recent Labs    06/28/18 1650 06/29/18 0100  HGB 9.9* 9.7*  HCT 33.0* 32.7*  PLT 247 229  APTT 29  --   LABPROT 13.4  --   INR 1.03  --   HEPARINUNFRC  --  0.97*  CREATININE 1.16 1.09  CKTOTAL 1,142*  --     Estimated Creatinine Clearance: 43.8 mL/min (by C-G formula based on SCr of 1.09 mg/dL).   Medical History: Past Medical History:  Diagnosis Date  . Arthritis   . CKD (chronic kidney disease) stage 3, GFR 30-59 ml/min (HCC) 12/29/2015  . Dry gangrene (HCC) 06/16/2017   Great toe and 5th toe LEFT foot; Dr. Wyn Quakerew  . Hyperlipidemia   . Hypertension   . Left leg DVT (HCC) 01/2014  . Myocardial infarction (HCC)   . Pulmonary embolism (HCC) 01/2014  . Thoracic aorta atherosclerosis (HCC) 10/08/2016    Medications:  Medications Prior to Admission  Medication Sig Dispense Refill Last Dose  . atenolol (TENORMIN) 50 MG tablet TAKE 1 TABLET (50 MG TOTAL) BY MOUTH DAILY. 90 tablet 1 06/27/2018 at 0800  . atorvastatin (LIPITOR) 10 MG tablet TAKE 1 TABLET BY MOUTH EVERYDAY AT BEDTIME 90 tablet 1 06/27/2018 at 2000  . Cholecalciferol (VITAMIN D-1000 MAX ST) 1000 units tablet Take 5 tablets by mouth daily.   06/27/2018 at 0800  . clopidogrel (PLAVIX) 75 MG tablet Take 1 tablet (75 mg total) by mouth daily. Use INSTEAD of Eliquis 30 tablet 0 06/28/2018 at 0800  . donepezil (ARICEPT) 5 MG tablet Take 1 tablet by mouth every evening.  6 06/27/2018 at 2000  . loratadine (CLARITIN) 10 MG tablet Take 1 tablet (10 mg total) by mouth daily as needed for allergies.   06/27/2018 at 0800  . Omega-3 Fatty Acids (FISH OIL PO) Take 1  capsule by mouth 2 (two) times daily.   06/27/2018 at 0800  . triamcinolone lotion (KENALOG) 0.1 % Apply 1 application topically 3 (three) times daily. 120 mL 1 06/27/2018 at 0800  . vitamin B-12 (CYANOCOBALAMIN) 1000 MCG tablet Take 1 tablet by mouth daily.   06/27/2018 at 0800  . albuterol (PROVENTIL HFA;VENTOLIN HFA) 108 (90 Base) MCG/ACT inhaler Inhale 2 puffs into the lungs every 4 (four) hours as needed for wheezing or shortness of breath. (Patient not taking: Reported on 06/28/2018) 1 Inhaler 1 Not Taking at Unknown time    Assessment: Pharmacy consulted to dose heparin in this 84 year old male with occluded artery.   No prior anticoag noted.  Hgb decreased slightly, but stable low will continue to monitor.  Heparin Course: 12/29 PM initiation: 4800 unit bolus, then 1200 units/hr 12/30 0100 HL 0.97: dec rate to 1100 units/hr 12/30 0956 HL 0.73  Goal of Therapy:  Heparin level 0.3-0.7 units/ml Monitor platelets by anticoagulation protocol: Yes   Plan:  Decrease rate to 1000 units/hr Check anti-Xa level in 8 hours and daily while on heparin Continue to monitor H&H and platelets  Burnis Medinodney Reeder Brisby, PharmD Clinical Pharmacist 06/29/2018

## 2018-06-29 NOTE — Progress Notes (Signed)
Pt complaining of increased pain after tylenol given. Primary nurse paged and spoke to Dr. Anne HahnWillis. MD to place orders for oxycodone. Primary nurse also made aware to Dr. Anne HahnWillis that pt Lactic Acid has not be rechecked. MD to place orders to recheck. Primary nurse to continue to monitor.

## 2018-06-29 NOTE — Progress Notes (Signed)
ANTICOAGULATION CONSULT NOTE - Initial Consult  Pharmacy Consult for Heparin  Indication: arterial occlusion  Allergies  Allergen Reactions  . Aspirin Anaphylaxis, Swelling and Shortness Of Breath    REACTION: terrible bp reaction/wheezing  . Bee Pollen Other (See Comments)    ragweed  . Pollen Extract     ragweed  . Rabbit Epithelium     rabbits    Patient Measurements: Height: 5\' 7"  (170.2 cm) Weight: 152 lb (68.9 kg) IBW/kg (Calculated) : 66.1 Heparin Dosing Weight:  68.9 kg   Vital Signs: Temp: 98.7 F (37.1 C) (12/29 2017) Temp Source: Oral (12/29 2017) BP: 146/52 (12/29 2017) Pulse Rate: 82 (12/29 2017)  Labs: Recent Labs    06/28/18 1650 06/29/18 0100  HGB 9.9* 9.7*  HCT 33.0* 32.7*  PLT 247 229  APTT 29  --   LABPROT 13.4  --   INR 1.03  --   HEPARINUNFRC  --  0.97*  CREATININE 1.16 1.09  CKTOTAL 1,142*  --     Estimated Creatinine Clearance: 43.8 mL/min (by C-G formula based on SCr of 1.09 mg/dL).   Medical History: Past Medical History:  Diagnosis Date  . Arthritis   . CKD (chronic kidney disease) stage 3, GFR 30-59 ml/min (HCC) 12/29/2015  . Dry gangrene (HCC) 06/16/2017   Great toe and 5th toe LEFT foot; Dr. Wyn Quakerew  . Hyperlipidemia   . Hypertension   . Left leg DVT (HCC) 01/2014  . Myocardial infarction (HCC)   . Pulmonary embolism (HCC) 01/2014  . Thoracic aorta atherosclerosis (HCC) 10/08/2016    Medications:  Medications Prior to Admission  Medication Sig Dispense Refill Last Dose  . atenolol (TENORMIN) 50 MG tablet TAKE 1 TABLET (50 MG TOTAL) BY MOUTH DAILY. 90 tablet 1 06/27/2018 at 0800  . atorvastatin (LIPITOR) 10 MG tablet TAKE 1 TABLET BY MOUTH EVERYDAY AT BEDTIME 90 tablet 1 06/27/2018 at 2000  . Cholecalciferol (VITAMIN D-1000 MAX ST) 1000 units tablet Take 5 tablets by mouth daily.   06/27/2018 at 0800  . clopidogrel (PLAVIX) 75 MG tablet Take 1 tablet (75 mg total) by mouth daily. Use INSTEAD of Eliquis 30 tablet 0 06/28/2018 at  0800  . donepezil (ARICEPT) 5 MG tablet Take 1 tablet by mouth every evening.  6 06/27/2018 at 2000  . loratadine (CLARITIN) 10 MG tablet Take 1 tablet (10 mg total) by mouth daily as needed for allergies.   06/27/2018 at 0800  . Omega-3 Fatty Acids (FISH OIL PO) Take 1 capsule by mouth 2 (two) times daily.   06/27/2018 at 0800  . triamcinolone lotion (KENALOG) 0.1 % Apply 1 application topically 3 (three) times daily. 120 mL 1 06/27/2018 at 0800  . vitamin B-12 (CYANOCOBALAMIN) 1000 MCG tablet Take 1 tablet by mouth daily.   06/27/2018 at 0800  . albuterol (PROVENTIL HFA;VENTOLIN HFA) 108 (90 Base) MCG/ACT inhaler Inhale 2 puffs into the lungs every 4 (four) hours as needed for wheezing or shortness of breath. (Patient not taking: Reported on 06/28/2018) 1 Inhaler 1 Not Taking at Unknown time    Assessment: Pharmacy consulted to dose heparin in this 84 year old male with occluded artery.   No prior anticoag noted. CrCl = 30.4   Goal of Therapy:  Heparin level 0.3-0.7 units/ml Monitor platelets by anticoagulation protocol: Yes   Plan:  12/30 @ 0100 HL 0.97 supratherapeutic. Will decrease rate to 1100 units/hr and will recheck HL @ 1000, hgb decreased slightly, but stable low will continue to monitor.  Onalee Huaavid  Simona HuhBesanti, PharmD, BCPS Clinical Pharmacist 06/29/2018

## 2018-06-29 NOTE — Progress Notes (Signed)
[facility]  Daily Progress Note   Subjective  - * No surgery found *  Patient is sleeping.  No family at the bedside.  No reported major events.  Objective Vitals:   06/28/18 1858 06/28/18 2017 06/29/18 0437 06/29/18 1141  BP: 137/87 (!) 146/52 (!) 145/51 (!) 121/54  Pulse: 78 82 71 68  Resp: 19 (!) 21 20   Temp: 97.9 F (36.6 C) 98.7 F (37.1 C) 98 F (36.7 C)   TempSrc: Oral Oral Oral   SpO2: 98% 99% 96% 99%  Weight:      Height:        Intake/Output Summary (Last 24 hours) at 06/29/2018 1358 Last data filed at 06/29/2018 0435 Gross per 24 hour  Intake 1109.66 ml  Output 350 ml  Net 759.66 ml    PULM  CTAB CV  somewhat irregular VASC  left foot somewhat cyanotic and cool in the toes and forefoot, but warm down into the foot.  The scabs on the foot are drying out as are the leg scabs.  No erythema or drainage.  Laboratory CBC    Component Value Date/Time   WBC 14.6 (H) 06/29/2018 0100   HGB 9.7 (L) 06/29/2018 0100   HGB 11.5 (L) 03/13/2015 0906   HCT 32.7 (L) 06/29/2018 0100   HCT 36.5 (L) 03/13/2015 0906   PLT 229 06/29/2018 0100   PLT 241 03/13/2015 0906    BMET    Component Value Date/Time   NA 142 06/29/2018 0100   NA 141 03/13/2015 0906   NA 137 02/07/2014 0402   K 4.2 06/29/2018 0100   K 4.3 02/07/2014 0402   CL 112 (H) 06/29/2018 0100   CL 103 02/07/2014 0402   CO2 25 06/29/2018 0100   CO2 22 02/07/2014 0402   GLUCOSE 122 (H) 06/29/2018 0100   GLUCOSE 104 (H) 02/06/2014 0620   BUN 26 (H) 06/29/2018 0100   BUN 27 03/13/2015 0906   BUN 20 (H) 02/06/2014 0620   CREATININE 1.09 06/29/2018 0100   CREATININE 1.57 (H) 06/18/2018 1547   CALCIUM 8.7 (L) 06/29/2018 0100   CALCIUM 8.1 (L) 02/06/2014 0620   GFRNONAA >60 06/29/2018 0100   GFRNONAA 39 (L) 06/18/2018 1547   GFRAA >60 06/29/2018 0100   GFRAA 45 (L) 06/18/2018 1547    Assessment/Planning:    PAD with possible rest pain of the left foot.  Foot does have  some cyanosis although he is fairly warm to the ankle.  His rash is drying out.  He will have a large amount of scabs on the left lower leg that are concerning.  Difficult situation with his rhabdomyolysis and recent acute renal insufficiency less than 2 weeks ago.  Will need significant hydration to try to avoid contrast nephropathy and would recommend running him on 50 to 75 cc an hour of saline for at least 24 to 48 hours prior to the procedure.  Would be at very high risk of worsening nephrotoxicity with contrast if angiogram done immediately.  I will tentatively plan for an angiogram on Thursday assuming his parameters from rhabdomyolysis and renal function improved.  Would continue heparin drip.  Situation is made more difficult with the mental functioning of the patient being quite poor and compliance being an issue going forward.  He is a very high risk of amputation    [name] [name]   06/29/2018, 1:58 PM

## 2018-06-29 NOTE — Progress Notes (Signed)
ANTICOAGULATION CONSULT NOTE  Pharmacy Consult for Heparin  Indication: arterial occlusion  Patient Measurements: Height: 5\' 7"  (170.2 cm) Weight: 152 lb (68.9 kg) IBW/kg (Calculated) : 66.1 Heparin Dosing Weight:  68.9 kg   Vital Signs: Temp: 98.2 F (36.8 C) (12/30 2046) Temp Source: Oral (12/30 2046) BP: 132/49 (12/30 2046) Pulse Rate: 70 (12/30 2046)  Labs: Recent Labs    06/28/18 1650 06/29/18 0100 06/29/18 0956 06/29/18 2018  HGB 9.9* 9.7*  --   --   HCT 33.0* 32.7*  --   --   PLT 247 229  --   --   APTT 29  --   --   --   LABPROT 13.4  --   --   --   INR 1.03  --   --   --   HEPARINUNFRC  --  0.97* 0.73* 0.43  CREATININE 1.16 1.09  --   --   CKTOTAL 1,142* 1,085*  --   --     Estimated Creatinine Clearance: 43.8 mL/min (by C-G formula based on SCr of 1.09 mg/dL).   Medical History: Past Medical History:  Diagnosis Date  . Arthritis   . CKD (chronic kidney disease) stage 3, GFR 30-59 ml/min (HCC) 12/29/2015  . Dry gangrene (HCC) 06/16/2017   Great toe and 5th toe LEFT foot; Dr. Wyn Quakerew  . Hyperlipidemia   . Hypertension   . Left leg DVT (HCC) 01/2014  . Myocardial infarction (HCC)   . Pulmonary embolism (HCC) 01/2014  . Thoracic aorta atherosclerosis (HCC) 10/08/2016    Medications:  Medications Prior to Admission  Medication Sig Dispense Refill Last Dose  . atenolol (TENORMIN) 50 MG tablet TAKE 1 TABLET (50 MG TOTAL) BY MOUTH DAILY. 90 tablet 1 06/27/2018 at 0800  . atorvastatin (LIPITOR) 10 MG tablet TAKE 1 TABLET BY MOUTH EVERYDAY AT BEDTIME 90 tablet 1 06/27/2018 at 2000  . Cholecalciferol (VITAMIN D-1000 MAX ST) 1000 units tablet Take 5 tablets by mouth daily.   06/27/2018 at 0800  . clopidogrel (PLAVIX) 75 MG tablet Take 1 tablet (75 mg total) by mouth daily. Use INSTEAD of Eliquis 30 tablet 0 06/28/2018 at 0800  . donepezil (ARICEPT) 5 MG tablet Take 1 tablet by mouth every evening.  6 06/27/2018 at 2000  . loratadine (CLARITIN) 10 MG tablet Take 1  tablet (10 mg total) by mouth daily as needed for allergies.   06/27/2018 at 0800  . Omega-3 Fatty Acids (FISH OIL PO) Take 1 capsule by mouth 2 (two) times daily.   06/27/2018 at 0800  . triamcinolone lotion (KENALOG) 0.1 % Apply 1 application topically 3 (three) times daily. 120 mL 1 06/27/2018 at 0800  . vitamin B-12 (CYANOCOBALAMIN) 1000 MCG tablet Take 1 tablet by mouth daily.   06/27/2018 at 0800  . albuterol (PROVENTIL HFA;VENTOLIN HFA) 108 (90 Base) MCG/ACT inhaler Inhale 2 puffs into the lungs every 4 (four) hours as needed for wheezing or shortness of breath. (Patient not taking: Reported on 06/28/2018) 1 Inhaler 1 Not Taking at Unknown time    Assessment: Pharmacy consulted to dose heparin in this 84 year old male with occluded artery.   No prior anticoag noted.  Hgb decreased slightly, but stable low will continue to monitor.  Heparin Course: 12/29 PM initiation: 4800 unit bolus, then 1200 units/hr 12/30 0100 HL 0.97: dec rate to 1100 units/hr 12/30 0956 HL 0.73 12/30 2035 HL 0.43   Goal of Therapy:  Heparin level 0.3-0.7 units/ml Monitor platelets by anticoagulation protocol: Yes  Plan:  12/30 @ 2018 HL: 0.43. Level therapeutic x 1. Continue rate of heparin 1000 units/hr Confirm anti-Xa level in 8 hours Continue to monitor H&H and platelets.  Anti-Xa level and CBC daily per protocol.   Gardner CandleSheema M Leverne Amrhein, PharmD, BCPS Clinical Pharmacist 06/29/2018 8:53 PM

## 2018-06-30 ENCOUNTER — Inpatient Hospital Stay: Payer: Medicare HMO

## 2018-06-30 LAB — HEPARIN LEVEL (UNFRACTIONATED)
Heparin Unfractionated: 0.29 IU/mL — ABNORMAL LOW (ref 0.30–0.70)
Heparin Unfractionated: 0.2 IU/mL — ABNORMAL LOW (ref 0.30–0.70)
Heparin Unfractionated: 0.43 IU/mL (ref 0.30–0.70)

## 2018-06-30 LAB — BASIC METABOLIC PANEL
Anion gap: 8 (ref 5–15)
BUN: 29 mg/dL — ABNORMAL HIGH (ref 8–23)
CHLORIDE: 110 mmol/L (ref 98–111)
CO2: 21 mmol/L — ABNORMAL LOW (ref 22–32)
Calcium: 8.3 mg/dL — ABNORMAL LOW (ref 8.9–10.3)
Creatinine, Ser: 1.14 mg/dL (ref 0.61–1.24)
GFR calc Af Amer: >60 mL/min (ref >60)
GFR calc non Af Amer: 57 mL/min — ABNORMAL LOW (ref >60)
Glucose, Bld: 124 mg/dL — ABNORMAL HIGH (ref 70–99)
Potassium: 4 mmol/L (ref 3.5–5.1)
SODIUM: 139 mmol/L (ref 135–145)

## 2018-06-30 LAB — CBC
HCT: 28.1 % — ABNORMAL LOW (ref 39.0–52.0)
Hemoglobin: 8.4 g/dL — ABNORMAL LOW (ref 13.0–17.0)
MCH: 23.3 pg — ABNORMAL LOW (ref 26.0–34.0)
MCHC: 29.9 g/dL — ABNORMAL LOW (ref 30.0–36.0)
MCV: 77.8 fL — ABNORMAL LOW (ref 80.0–100.0)
NRBC: 0 % (ref 0.0–0.2)
Platelets: 212 10*3/uL (ref 150–400)
RBC: 3.61 MIL/uL — ABNORMAL LOW (ref 4.22–5.81)
RDW: 15.5 % (ref 11.5–15.5)
WBC: 14.5 10*3/uL — ABNORMAL HIGH (ref 4.0–10.5)

## 2018-06-30 LAB — CK: Total CK: 1152 U/L — ABNORMAL HIGH (ref 49–397)

## 2018-06-30 MED ORDER — POLYETHYLENE GLYCOL 3350 17 G PO PACK
17.0000 g | PACK | Freq: Two times a day (BID) | ORAL | Status: AC
  Administered 2018-06-30: 17 g via ORAL
  Filled 2018-06-30: qty 1

## 2018-06-30 MED ORDER — SENNOSIDES-DOCUSATE SODIUM 8.6-50 MG PO TABS
1.0000 | ORAL_TABLET | Freq: Two times a day (BID) | ORAL | Status: DC
  Administered 2018-06-30 – 2018-07-10 (×14): 1 via ORAL
  Filled 2018-06-30 (×15): qty 1

## 2018-06-30 MED ORDER — HEPARIN BOLUS VIA INFUSION
1000.0000 [IU] | Freq: Once | INTRAVENOUS | Status: AC
  Administered 2018-06-30: 1000 [IU] via INTRAVENOUS
  Filled 2018-06-30: qty 1000

## 2018-06-30 NOTE — Progress Notes (Signed)
ANTICOAGULATION CONSULT NOTE  Pharmacy Consult for Heparin  Indication: arterial occlusion  Patient Measurements: Height: 5\' 7"  (170.2 cm) Weight: 152 lb (68.9 kg) IBW/kg (Calculated) : 66.1 Heparin Dosing Weight:  68.9 kg   Vital Signs: Temp: 99.3 F (37.4 C) (12/31 0429) Temp Source: Oral (12/31 0429) BP: 122/79 (12/31 0429) Pulse Rate: 71 (12/31 0429)  Labs: Recent Labs    06/28/18 1650  06/29/18 0100 06/29/18 0956 06/29/18 2018 06/30/18 0416  HGB 9.9*  --  9.7*  --   --  8.4*  HCT 33.0*  --  32.7*  --   --  28.1*  PLT 247  --  229  --   --  212  APTT 29  --   --   --   --   --   LABPROT 13.4  --   --   --   --   --   INR 1.03  --   --   --   --   --   HEPARINUNFRC  --    < > 0.97* 0.73* 0.43 0.29*  CREATININE 1.16  --  1.09  --   --  1.14  CKTOTAL 1,142*  --  1,085*  --   --  1,152*   < > = values in this interval not displayed.    Estimated Creatinine Clearance: 41.9 mL/min (by C-G formula based on SCr of 1.14 mg/dL).   Medical History: Past Medical History:  Diagnosis Date  . Arthritis   . CKD (chronic kidney disease) stage 3, GFR 30-59 ml/min (HCC) 12/29/2015  . Dry gangrene (HCC) 06/16/2017   Great toe and 5th toe LEFT foot; Dr. Wyn Quakerew  . Hyperlipidemia   . Hypertension   . Left leg DVT (HCC) 01/2014  . Myocardial infarction (HCC)   . Pulmonary embolism (HCC) 01/2014  . Thoracic aorta atherosclerosis (HCC) 10/08/2016    Medications:  Medications Prior to Admission  Medication Sig Dispense Refill Last Dose  . atenolol (TENORMIN) 50 MG tablet TAKE 1 TABLET (50 MG TOTAL) BY MOUTH DAILY. 90 tablet 1 06/27/2018 at 0800  . atorvastatin (LIPITOR) 10 MG tablet TAKE 1 TABLET BY MOUTH EVERYDAY AT BEDTIME 90 tablet 1 06/27/2018 at 2000  . Cholecalciferol (VITAMIN D-1000 MAX ST) 1000 units tablet Take 5 tablets by mouth daily.   06/27/2018 at 0800  . clopidogrel (PLAVIX) 75 MG tablet Take 1 tablet (75 mg total) by mouth daily. Use INSTEAD of Eliquis 30 tablet 0  06/28/2018 at 0800  . donepezil (ARICEPT) 5 MG tablet Take 1 tablet by mouth every evening.  6 06/27/2018 at 2000  . loratadine (CLARITIN) 10 MG tablet Take 1 tablet (10 mg total) by mouth daily as needed for allergies.   06/27/2018 at 0800  . Omega-3 Fatty Acids (FISH OIL PO) Take 1 capsule by mouth 2 (two) times daily.   06/27/2018 at 0800  . triamcinolone lotion (KENALOG) 0.1 % Apply 1 application topically 3 (three) times daily. 120 mL 1 06/27/2018 at 0800  . vitamin B-12 (CYANOCOBALAMIN) 1000 MCG tablet Take 1 tablet by mouth daily.   06/27/2018 at 0800  . albuterol (PROVENTIL HFA;VENTOLIN HFA) 108 (90 Base) MCG/ACT inhaler Inhale 2 puffs into the lungs every 4 (four) hours as needed for wheezing or shortness of breath. (Patient not taking: Reported on 06/28/2018) 1 Inhaler 1 Not Taking at Unknown time    Assessment: Pharmacy consulted to dose heparin in this 84 year old male with occluded artery.   No prior anticoag  noted.  Hgb still trending downward with PLT wnl;  will continue to monitor.  Heparin Course: 12/29 PM initiation: 4800 unit bolus, then 1200 units/hr 12/30 0100 HL 0.97: dec rate to 1100 units/hr 12/30 0956 HL 0.73: dec rate to 1000 units/hr 12/30 2035 HL 0.43 12/31 0416 HL 0.29: increase rate to 1050 units/hr 12/31 1246 HL 0.20  Goal of Therapy:  Heparin level 0.3-0.7 units/ml Monitor platelets by anticoagulation protocol: Yes   Plan:  Bolus 1000 units heparin, then increase infusion rate to 1200 units/hr Check anti-Xa level in 8 hours and daily while on heparin Continue to monitor H&H and platelets  Burnis Medinodney Graviela Nodal, PharmD Clinical Pharmacist 06/30/2018

## 2018-06-30 NOTE — Progress Notes (Signed)
[facility]  Daily Progress Note   Subjective  - * No surgery found *  No major events overnight.  Hydration has been started.  His foot is actually pinker and warmer today than it was yesterday.  He does say it still hurts.  The dry scaling rash is stable.  Objective Vitals:   06/29/18 0437 06/29/18 1141 06/29/18 2046 06/30/18 0429  BP: (!) 145/51 (!) 121/54 (!) 132/49 122/79  Pulse: 71 68 70 71  Resp: 20  (!) 22 (!) 22  Temp: 98 F (36.7 C)  98.2 F (36.8 C) 99.3 F (37.4 C)  TempSrc: Oral  Oral Oral  SpO2: 96% 99% 93% 94%  Weight:      Height:        Intake/Output Summary (Last 24 hours) at 06/30/2018 0957 Last data filed at 06/30/2018 0700 Gross per 24 hour  Intake 1652.84 ml  Output 200 ml  Net 1452.84 ml    PULM  CTAB CV  RRR VASC  left foot has good capillary refill today and is much pinker.  It is also fairly warm.  The dry scaling rash is stable.  Laboratory CBC    Component Value Date/Time   WBC 14.5 (H) 06/30/2018 0416   HGB 8.4 (L) 06/30/2018 0416   HGB 11.5 (L) 03/13/2015 0906   HCT 28.1 (L) 06/30/2018 0416   HCT 36.5 (L) 03/13/2015 0906   PLT 212 06/30/2018 0416   PLT 241 03/13/2015 0906    BMET    Component Value Date/Time   NA 139 06/30/2018 0416   NA 141 03/13/2015 0906   NA 137 02/07/2014 0402   K 4.0 06/30/2018 0416   K 4.3 02/07/2014 0402   CL 110 06/30/2018 0416   CL 103 02/07/2014 0402   CO2 21 (L) 06/30/2018 0416   CO2 22 02/07/2014 0402   GLUCOSE 124 (H) 06/30/2018 0416   GLUCOSE 104 (H) 02/06/2014 0620   BUN 29 (H) 06/30/2018 0416   BUN 27 03/13/2015 0906   BUN 20 (H) 02/06/2014 0620   CREATININE 1.14 06/30/2018 0416   CREATININE 1.57 (H) 06/18/2018 1547   CALCIUM 8.3 (L) 06/30/2018 0416   CALCIUM 8.1 (L) 02/06/2014 0620   GFRNONAA 57 (L) 06/30/2018 0416   GFRNONAA 39 (L) 06/18/2018 1547   GFRAA >60 06/30/2018 0416   GFRAA 45 (L) 06/18/2018 1547    Assessment/Planning:    PAD and left foot  pain.  Angiogram has been scheduled for Thursday.  Risks and benefits were discussed with the patient.  Appreciate hydration and this should likely continue through his procedure with his recent acute kidney injury and rhabdomyolysis.  He is at high risk of contrast nephropathy and maximal hydration is of benefit.  Would continue heparin drip.    Festus BarrenJason   06/30/2018, 9:57 AM

## 2018-06-30 NOTE — Progress Notes (Signed)
ANTICOAGULATION CONSULT NOTE  Pharmacy Consult for Heparin  Indication: arterial occlusion  Patient Measurements: Height: 5\' 7"  (170.2 cm) Weight: 152 lb (68.9 kg) IBW/kg (Calculated) : 66.1 Heparin Dosing Weight:  68.9 kg   Vital Signs: Temp: 99.3 F (37.4 C) (12/31 0429) Temp Source: Oral (12/31 0429) BP: 122/79 (12/31 0429) Pulse Rate: 71 (12/31 0429)  Labs: Recent Labs    06/28/18 1650  06/29/18 0100 06/29/18 0956 06/29/18 2018 06/30/18 0416  HGB 9.9*  --  9.7*  --   --  8.4*  HCT 33.0*  --  32.7*  --   --  28.1*  PLT 247  --  229  --   --  212  APTT 29  --   --   --   --   --   LABPROT 13.4  --   --   --   --   --   INR 1.03  --   --   --   --   --   HEPARINUNFRC  --    < > 0.97* 0.73* 0.43 0.29*  CREATININE 1.16  --  1.09  --   --  1.14  CKTOTAL 1,142*  --  1,085*  --   --  1,152*   < > = values in this interval not displayed.    Estimated Creatinine Clearance: 41.9 mL/min (by C-G formula based on SCr of 1.14 mg/dL).   Medical History: Past Medical History:  Diagnosis Date  . Arthritis   . CKD (chronic kidney disease) stage 3, GFR 30-59 ml/min (HCC) 12/29/2015  . Dry gangrene (HCC) 06/16/2017   Great toe and 5th toe LEFT foot; Dr. Wyn Quakerew  . Hyperlipidemia   . Hypertension   . Left leg DVT (HCC) 01/2014  . Myocardial infarction (HCC)   . Pulmonary embolism (HCC) 01/2014  . Thoracic aorta atherosclerosis (HCC) 10/08/2016    Medications:  Medications Prior to Admission  Medication Sig Dispense Refill Last Dose  . atenolol (TENORMIN) 50 MG tablet TAKE 1 TABLET (50 MG TOTAL) BY MOUTH DAILY. 90 tablet 1 06/27/2018 at 0800  . atorvastatin (LIPITOR) 10 MG tablet TAKE 1 TABLET BY MOUTH EVERYDAY AT BEDTIME 90 tablet 1 06/27/2018 at 2000  . Cholecalciferol (VITAMIN D-1000 MAX ST) 1000 units tablet Take 5 tablets by mouth daily.   06/27/2018 at 0800  . clopidogrel (PLAVIX) 75 MG tablet Take 1 tablet (75 mg total) by mouth daily. Use INSTEAD of Eliquis 30 tablet 0  06/28/2018 at 0800  . donepezil (ARICEPT) 5 MG tablet Take 1 tablet by mouth every evening.  6 06/27/2018 at 2000  . loratadine (CLARITIN) 10 MG tablet Take 1 tablet (10 mg total) by mouth daily as needed for allergies.   06/27/2018 at 0800  . Omega-3 Fatty Acids (FISH OIL PO) Take 1 capsule by mouth 2 (two) times daily.   06/27/2018 at 0800  . triamcinolone lotion (KENALOG) 0.1 % Apply 1 application topically 3 (three) times daily. 120 mL 1 06/27/2018 at 0800  . vitamin B-12 (CYANOCOBALAMIN) 1000 MCG tablet Take 1 tablet by mouth daily.   06/27/2018 at 0800  . albuterol (PROVENTIL HFA;VENTOLIN HFA) 108 (90 Base) MCG/ACT inhaler Inhale 2 puffs into the lungs every 4 (four) hours as needed for wheezing or shortness of breath. (Patient not taking: Reported on 06/28/2018) 1 Inhaler 1 Not Taking at Unknown time    Assessment: Pharmacy consulted to dose heparin in this 84 year old male with occluded artery.   No prior anticoag  noted.  Hgb decreased slightly, but stable low will continue to monitor.  Heparin Course: 12/29 PM initiation: 4800 unit bolus, then 1200 units/hr 12/30 0100 HL 0.97: dec rate to 1100 units/hr 12/30 0956 HL 0.73 12/30 2035 HL 0.43   Goal of Therapy:  Heparin level 0.3-0.7 units/ml Monitor platelets by anticoagulation protocol: Yes   Plan:  12/31 @ 0400 HL 0.29  Borderline subtherapeutic. Will increase rate to 1050 units/hr and will recheck HL @ 1300, hgb trending down will continue to monitor.  Thomasene Rippleavid  Anastasiya Gowin, PharmD, BCPS Clinical Pharmacist 06/30/2018 5:33 AM

## 2018-06-30 NOTE — Progress Notes (Signed)
ANTICOAGULATION CONSULT NOTE  Pharmacy Consult for Heparin  Indication: arterial occlusion  Patient Measurements: Height: 5\' 7"  (170.2 cm) Weight: 152 lb (68.9 kg) IBW/kg (Calculated) : 66.1 Heparin Dosing Weight:  68.9 kg   Vital Signs: Temp: 99.3 F (37.4 C) (12/31 2128) Temp Source: Oral (12/31 2128) BP: 135/53 (12/31 2128) Pulse Rate: 72 (12/31 2128)  Labs: Recent Labs    06/28/18 1650 06/29/18 0100  06/30/18 0416 06/30/18 1246 06/30/18 2305  HGB 9.9* 9.7*  --  8.4*  --   --   HCT 33.0* 32.7*  --  28.1*  --   --   PLT 247 229  --  212  --   --   APTT 29  --   --   --   --   --   LABPROT 13.4  --   --   --   --   --   INR 1.03  --   --   --   --   --   HEPARINUNFRC  --  0.97*   < > 0.29* 0.20* 0.43  CREATININE 1.16 1.09  --  1.14  --   --   CKTOTAL 1,142* 1,085*  --  1,152*  --   --    < > = values in this interval not displayed.    Estimated Creatinine Clearance: 41.9 mL/min (by C-G formula based on SCr of 1.14 mg/dL).   Medical History: Past Medical History:  Diagnosis Date  . Arthritis   . CKD (chronic kidney disease) stage 3, GFR 30-59 ml/min (HCC) 12/29/2015  . Dry gangrene (HCC) 06/16/2017   Great toe and 5th toe LEFT foot; Dr. Wyn Quakerew  . Hyperlipidemia   . Hypertension   . Left leg DVT (HCC) 01/2014  . Myocardial infarction (HCC)   . Pulmonary embolism (HCC) 01/2014  . Thoracic aorta atherosclerosis (HCC) 10/08/2016    Medications:  Medications Prior to Admission  Medication Sig Dispense Refill Last Dose  . atenolol (TENORMIN) 50 MG tablet TAKE 1 TABLET (50 MG TOTAL) BY MOUTH DAILY. 90 tablet 1 06/27/2018 at 0800  . atorvastatin (LIPITOR) 10 MG tablet TAKE 1 TABLET BY MOUTH EVERYDAY AT BEDTIME 90 tablet 1 06/27/2018 at 2000  . Cholecalciferol (VITAMIN D-1000 MAX ST) 1000 units tablet Take 5 tablets by mouth daily.   06/27/2018 at 0800  . clopidogrel (PLAVIX) 75 MG tablet Take 1 tablet (75 mg total) by mouth daily. Use INSTEAD of Eliquis 30 tablet 0  06/28/2018 at 0800  . donepezil (ARICEPT) 5 MG tablet Take 1 tablet by mouth every evening.  6 06/27/2018 at 2000  . loratadine (CLARITIN) 10 MG tablet Take 1 tablet (10 mg total) by mouth daily as needed for allergies.   06/27/2018 at 0800  . Omega-3 Fatty Acids (FISH OIL PO) Take 1 capsule by mouth 2 (two) times daily.   06/27/2018 at 0800  . triamcinolone lotion (KENALOG) 0.1 % Apply 1 application topically 3 (three) times daily. 120 mL 1 06/27/2018 at 0800  . vitamin B-12 (CYANOCOBALAMIN) 1000 MCG tablet Take 1 tablet by mouth daily.   06/27/2018 at 0800  . albuterol (PROVENTIL HFA;VENTOLIN HFA) 108 (90 Base) MCG/ACT inhaler Inhale 2 puffs into the lungs every 4 (four) hours as needed for wheezing or shortness of breath. (Patient not taking: Reported on 06/28/2018) 1 Inhaler 1 Not Taking at Unknown time    Assessment: Pharmacy consulted to dose heparin in this 84 year old male with occluded artery.   No prior anticoag  noted.  Hgb still trending downward with PLT wnl;  will continue to monitor.  Heparin Course: 12/29 PM initiation: 4800 unit bolus, then 1200 units/hr 12/30 0100 HL 0.97: dec rate to 1100 units/hr 12/30 0956 HL 0.73: dec rate to 1000 units/hr 12/30 2035 HL 0.43 12/31 0416 HL 0.29: increase rate to 1050 units/hr 12/31 1246 HL 0.20  Goal of Therapy:  Heparin level 0.3-0.7 units/ml Monitor platelets by anticoagulation protocol: Yes   Plan:  12/31 @ 2300 HL 0.43 therapeutic. Will continue current rate and will recheck HL @ 0700, CBC trending down will continue to monitor.  Thomasene Rippleavid Raven Harmes, PharmD, BCPS Clinical Pharmacist 06/30/2018

## 2018-06-30 NOTE — Progress Notes (Signed)
[facility] - [place] [facility]   PATIENT NAME: [name] [name]    MR#:  [id number]  DATE OF BIRTH:  08-25-1927  SUBJECTIVE:  CHIEF COMPLAINT:   Chief Complaint  Patient presents with  . Fall   Pain LLE still present. On heparin drip.  No melena or blood in stool  REVIEW OF SYSTEMS:    Review of Systems  Constitutional: Positive for malaise/fatigue. Negative for chills and fever.  HENT: Negative for sore throat.   Eyes: Negative for blurred vision, double vision and pain.  Respiratory: Negative for cough, hemoptysis, shortness of breath and wheezing.   Cardiovascular: Negative for chest pain, palpitations, orthopnea and leg swelling.  Gastrointestinal: Negative for abdominal pain, constipation, diarrhea, heartburn, nausea and vomiting.  Genitourinary: Negative for dysuria and hematuria.  Musculoskeletal: Negative for back pain and joint pain.  Skin: Negative for rash.  Neurological: Negative for sensory change, speech change, focal weakness and headaches.  Endo/Heme/Allergies: Does not bruise/bleed easily.  Psychiatric/Behavioral: Negative for depression. The patient is not nervous/anxious.     DRUG ALLERGIES:   Allergies  Allergen Reactions  . Aspirin Anaphylaxis, Swelling and Shortness Of Breath    REACTION: terrible bp reaction/wheezing  . Bee Pollen Other (See Comments)    ragweed  . Pollen Extract     ragweed  . Rabbit Epithelium     rabbits    VITALS:  Blood pressure (!) 131/51, pulse 75, temperature 99.3 F (37.4 C), temperature source Oral, resp. rate 19, height 5\' 7"  (1.702 m), weight 68.9 kg, SpO2 100 %.  PHYSICAL EXAMINATION:   Physical Exam  GENERAL:  84 y.o.-year-old patient lying in the bed with no acute distress.  EYES: Pupils equal, round, reactive to light and accommodation. No scleral icterus. Extraocular muscles intact.  HEENT: Head atraumatic, normocephalic. Oropharynx and nasopharynx clear.  NECK:  Supple, no jugular  venous distention. No thyroid enlargement, no tenderness.  LUNGS: Normal breath sounds bilaterally, no wheezing, rales, rhonchi. No use of accessory muscles of respiration.  CARDIOVASCULAR: S1, S2 normal. No murmurs, rubs, or gallops.  ABDOMEN: Soft, nontender, nondistended. Bowel sounds present. No organomegaly or mass.  EXTREMITIES: Left leg cold to touch with petechiae NEUROLOGIC: Cranial nerves II through XII are intact. No focal Motor or sensory deficits b/l.   PSYCHIATRIC: The patient is alert and oriented x 3.  SKIN: No obvious rash, lesion, or ulcer.   LABORATORY PANEL:   CBC Recent Labs  Lab 06/30/18 0416  WBC 14.5*  HGB 8.4*  HCT 28.1*  PLT 212   ------------------------------------------------------------------------------------------------------------------ Chemistries  Recent Labs  Lab 06/28/18 1650  06/30/18 0416  NA 142   < > 139  K 4.0   < > 4.0  CL 109   < > 110  CO2 24   < > 21*  GLUCOSE 116*   < > 124*  BUN 31*   < > 29*  CREATININE 1.16   < > 1.14  CALCIUM 9.2   < > 8.3*  AST 50*  --   --   ALT 27  --   --   ALKPHOS 65  --   --   BILITOT 0.5  --   --    < > = values in this interval not displayed.   ------------------------------------------------------------------------------------------------------------------  Cardiac Enzymes No results for input(s): TROPONINI in the last 168 hours. ------------------------------------------------------------------------------------------------------------------  RADIOLOGY:  No results found.   ASSESSMENT AND PLAN:   Patient is 84 year old with peripheral vascular disease with left  lower extremity pain  *  left lower extremity ischemia Acute onset Evaluated by vascular surgery.  Heparin drip. Pain medications as needed He will need angiogram and has been scheduled for Thursday  * Acute rhabdomyolysis On IVF  * Rash resolved  *  Anemia of chronic disease. Stable  *  Hypertension continue  atenolol  *  Hyperlipidemia continue Lipitor  All the records are reviewed and case discussed with Care Management/Social Worker Management plans discussed with the patient, family and they are in agreement.  CODE STATUS: FULL CODE  DVT Prophylaxis: SCDs  TOTAL TIME TAKING CARE OF THIS PATIENT: 35 minutes.   POSSIBLE D/C IN 2-3 DAYS, DEPENDING ON CLINICAL CONDITION.  Molinda BailiffSrikar R Eluterio Seymour M.D on 06/30/2018 at 12:36 PM  Between 7am to 6pm - Pager - [phone]  After 6pm go to [email or web address] - password [email or web address] [facility]  [facility]  Office  [phone]  CC: Primary care physician; Kerman PasseyLada, Melinda P, MD  Note: This dictation was prepared with Dragon dictation along with smaller phrase technology. Any transcriptional errors that result from this process are unintentional.

## 2018-07-01 LAB — CBC WITH DIFFERENTIAL/PLATELET
Abs Immature Granulocytes: 0.29 10*3/uL — ABNORMAL HIGH (ref 0.00–0.07)
Basophils Absolute: 0 10*3/uL (ref 0.0–0.1)
Basophils Relative: 0 %
EOS ABS: 0 10*3/uL (ref 0.0–0.5)
Eosinophils Relative: 0 %
HCT: 31 % — ABNORMAL LOW (ref 39.0–52.0)
Hemoglobin: 9.3 g/dL — ABNORMAL LOW (ref 13.0–17.0)
Immature Granulocytes: 2 %
Lymphocytes Relative: 8 %
Lymphs Abs: 1.5 10*3/uL (ref 0.7–4.0)
MCH: 23.7 pg — ABNORMAL LOW (ref 26.0–34.0)
MCHC: 30 g/dL (ref 30.0–36.0)
MCV: 78.9 fL — ABNORMAL LOW (ref 80.0–100.0)
Monocytes Absolute: 1.8 10*3/uL — ABNORMAL HIGH (ref 0.1–1.0)
Monocytes Relative: 9 %
NEUTROS ABS: 15.6 10*3/uL — AB (ref 1.7–7.7)
Neutrophils Relative %: 81 %
Platelets: 269 10*3/uL (ref 150–400)
RBC: 3.93 MIL/uL — ABNORMAL LOW (ref 4.22–5.81)
RDW: 15.7 % — ABNORMAL HIGH (ref 11.5–15.5)
WBC: 19.2 10*3/uL — ABNORMAL HIGH (ref 4.0–10.5)
nRBC: 0 % (ref 0.0–0.2)

## 2018-07-01 LAB — BASIC METABOLIC PANEL
Anion gap: 6 (ref 5–15)
BUN: 28 mg/dL — ABNORMAL HIGH (ref 8–23)
CO2: 21 mmol/L — ABNORMAL LOW (ref 22–32)
Calcium: 8.2 mg/dL — ABNORMAL LOW (ref 8.9–10.3)
Chloride: 110 mmol/L (ref 98–111)
Creatinine, Ser: 1.06 mg/dL (ref 0.61–1.24)
GFR calc Af Amer: >60 mL/min (ref >60)
GFR calc non Af Amer: >60 mL/min (ref >60)
Glucose, Bld: 116 mg/dL — ABNORMAL HIGH (ref 70–99)
Potassium: 3.9 mmol/L (ref 3.5–5.1)
Sodium: 137 mmol/L (ref 135–145)

## 2018-07-01 LAB — CULTURE, BLOOD (ROUTINE X 2): Special Requests: ADEQUATE

## 2018-07-01 LAB — HEPARIN LEVEL (UNFRACTIONATED): Heparin Unfractionated: 0.48 IU/mL (ref 0.30–0.70)

## 2018-07-01 LAB — CK: Total CK: 2198 U/L — ABNORMAL HIGH (ref 49–397)

## 2018-07-01 MED ORDER — METHYLPREDNISOLONE SODIUM SUCC 125 MG IJ SOLR
60.0000 mg | Freq: Every day | INTRAMUSCULAR | Status: DC
  Administered 2018-07-01: 60 mg via INTRAVENOUS
  Filled 2018-07-01: qty 2

## 2018-07-01 MED ORDER — LEVOFLOXACIN IN D5W 750 MG/150ML IV SOLN
750.0000 mg | INTRAVENOUS | Status: DC
  Administered 2018-07-01: 750 mg via INTRAVENOUS
  Filled 2018-07-01: qty 150

## 2018-07-01 NOTE — Progress Notes (Signed)
[employer] - [place] at [facility]   PATIENT NAME: [name] [name]    MR#:  [id number]  DATE OF BIRTH:  08-04-1927  SUBJECTIVE:  CHIEF COMPLAINT:   Chief Complaint  Patient presents with  . Fall   Pain LLE still present. On heparin drip.  No melena or blood in stool  SOB present. No cough. Not on O2  REVIEW OF SYSTEMS:    Review of Systems  Constitutional: Positive for malaise/fatigue. Negative for chills and fever.  HENT: Negative for sore throat.   Eyes: Negative for blurred vision, double vision and pain.  Respiratory: Negative for cough, hemoptysis, shortness of breath and wheezing.   Cardiovascular: Negative for chest pain, palpitations, orthopnea and leg swelling.  Gastrointestinal: Negative for abdominal pain, constipation, diarrhea, heartburn, nausea and vomiting.  Genitourinary: Negative for dysuria and hematuria.  Musculoskeletal: Negative for back pain and joint pain.  Skin: Negative for rash.  Neurological: Negative for sensory change, speech change, focal weakness and headaches.  Endo/Heme/Allergies: Does not bruise/bleed easily.  Psychiatric/Behavioral: Negative for depression. The patient is not nervous/anxious.    DRUG ALLERGIES:   Allergies  Allergen Reactions  . Aspirin Anaphylaxis, Swelling and Shortness Of Breath    REACTION: terrible bp reaction/wheezing  . Bee Pollen Other (See Comments)    ragweed  . Pollen Extract     ragweed  . Rabbit Epithelium     rabbits    VITALS:  Blood pressure 136/62, pulse 81, temperature 98.3 F (36.8 C), temperature source Oral, resp. rate (!) 30, height 5\' 7"  (1.702 m), weight 68.9 kg, SpO2 97 %.  PHYSICAL EXAMINATION:   Physical Exam  GENERAL:  85 y.o.-year-old patient lying in the bed with no acute distress.  EYES: Pupils equal, round, reactive to light and accommodation. No scleral icterus. Extraocular muscles intact.  HEENT: Head atraumatic, normocephalic. Oropharynx and nasopharynx  clear.  NECK:  Supple, no jugular venous distention. No thyroid enlargement, no tenderness.  LUNGS:  no wheezing, rales, rhonchi. No use of accessory muscles of respiration.  CARDIOVASCULAR: S1, S2 normal. No murmurs, rubs, or gallops.  ABDOMEN: Soft, nontender, nondistended. Bowel sounds present. No organomegaly or mass.  EXTREMITIES: Left leg cold to touch with petechiae NEUROLOGIC: Cranial nerves II through XII are intact. No focal Motor or sensory deficits b/l.   PSYCHIATRIC: The patient is alert and oriented x 3.  SKIN: No obvious rash, lesion, or ulcer.   LABORATORY PANEL:   CBC Recent Labs  Lab 07/01/18 0449  WBC 19.2*  HGB 9.3*  HCT 31.0*  PLT 269   ------------------------------------------------------------------------------------------------------------------ Chemistries  Recent Labs  Lab 06/28/18 1650  07/01/18 0449  NA 142   < > 137  K 4.0   < > 3.9  CL 109   < > 110  CO2 24   < > 21*  GLUCOSE 116*   < > 116*  BUN 31*   < > 28*  CREATININE 1.16   < > 1.06  CALCIUM 9.2   < > 8.2*  AST 50*  --   --   ALT 27  --   --   ALKPHOS 65  --   --   BILITOT 0.5  --   --    < > = values in this interval not displayed.   ------------------------------------------------------------------------------------------------------------------  Cardiac Enzymes No results for input(s): TROPONINI in the last 168 hours. ------------------------------------------------------------------------------------------------------------------  RADIOLOGY:  Dg Chest 2 View  Result Date: 06/30/2018 CLINICAL DATA:  Wheezing, hypoxia, former smoker.  Previous MI. Patient was hospitalized 2 days ago due to a fall. EXAM: CHEST - 2 VIEW COMPARISON:  PA and lateral chest x-ray of June 14, 2017 FINDINGS: Today's study is obtained in a more lordotic fashion. The lungs are well-expanded. The interstitial markings are coarse. There is thickening of the minor fissure. There is patchy increased  density at the left lung base. Previously demonstrated density in the right upper lobe is less conspicuous today. The heart and pulmonary vascularity are normal. There is calcification in the wall of the aortic arch. IMPRESSION: Chronic interstitial changes bilaterally. Slightly increased interstitial density at the left lung base may reflect developing pneumonia. Thoracic aortic atherosclerosis. Electronically Signed   By: David  SwazilandJordan M.D.   On: 06/30/2018 14:32     ASSESSMENT AND PLAN:   Patient is 85 year old with peripheral vascular disease with left lower extremity pain  * Pneumonia Patient complains of shortness of breath.  Chest x-ray showing infiltrate.  He does have elevated WBC.  WBC elevation could also be due to steroids he received on admission for wheezing. We will start Levaquin.  Not needing oxygen.  Nebulizers as needed.  *  left lower extremity ischemia Acute onset Evaluated by vascular surgery.  Heparin drip. Pain medications as needed He will need angiogram and has been scheduled for Thursday  * Acute rhabdomyolysis On IVF CK levels worse today and likely from his right leg.  Continue IV fluids.  * Rash resolved  *  Anemia of chronic disease. Stable  *  Hypertension continue atenolol  *  Hyperlipidemia continue Lipitor  All the records are reviewed and case discussed with Care Management/Social Worker Management plans discussed with the patient, family and they are in agreement.  CODE STATUS: FULL CODE  DVT Prophylaxis: SCDs  TOTAL TIME TAKING CARE OF THIS PATIENT: 35 minutes.   POSSIBLE D/C IN 2-3 DAYS, DEPENDING ON CLINICAL CONDITION.  Molinda BailiffSrikar R Cledis Sohn M.D on 07/01/2018 at 1:40 PM  Between 7am to 6pm - Pager - [phone]  After 6pm go to [email or web address] - password [email or web address] [facility]  [facility] [place] [facility]  Office  [phone]  CC: Primary care physician; Kerman PasseyLada, Melinda P, MD  Note: This dictation was prepared with Dragon dictation along  with smaller phrase technology. Any transcriptional errors that result from this process are unintentional.

## 2018-07-01 NOTE — Consult Note (Signed)
Pharmacy Antibiotic Note  [name] [name] is a 85 y.o. male admitted on 06/28/2018 with an arterial occlusion.  Pharmacy has been consulted for Levaquin dosing after a chest x-ray shows possible PNA.  Plan: Start IV Levaquin 750mg  every 48 hours  Height: 5\' 7"  (170.2 cm) Weight: 152 lb (68.9 kg) IBW/kg (Calculated) : 66.1  Temp (24hrs), Avg:98.9 F (37.2 C), Min:98.3 F (36.8 C), Max:99.3 F (37.4 C)  Recent Labs  Lab 06/28/18 1650 06/28/18 1659 06/29/18 0100 06/29/18 0502 06/30/18 0416 07/01/18 0449  WBC 13.6*  --  14.6*  --  14.5* 19.2*  CREATININE 1.16  --  1.09  --  1.14 1.06  LATICACIDVEN  --  2.16*  --  1.2  --   --     Estimated Creatinine Clearance: 45 mL/min (by C-G formula based on SCr of 1.06 mg/dL).    Antimicrobials this admission: levaquin 1/1 >>   Microbiology results: 12/29 BCx: 1/4 gram variable rods (considered to be a contaminant)  Thank you for allowing pharmacy to be a part of this patient's care.  Lowella Bandyodney D Jennipher Weatherholtz, PharmD 07/01/2018 12:31 PM

## 2018-07-01 NOTE — Care Management Important Message (Signed)
Copy of signed Medicare IM left with patient in room. 

## 2018-07-01 NOTE — Care Management (Signed)
Patient admitted from home with PAD. Lives at home with wife.  PCP Lada.  Pharmacy [facility] [place].  Plan for patient to have angiogram on Thursday, would benefit for PT eval after cleared from procedure

## 2018-07-01 NOTE — Progress Notes (Signed)
Patient having labored breathing and very SOB on RA. BP 176/70, HR 74, RR 30, LS diminished with some wheezing. Placed patient on 2L O2 contacted MD. Administered breathing treatment and gave IV solumedrol per MD order.   Madie Reno, RN

## 2018-07-01 NOTE — Progress Notes (Signed)
ANTICOAGULATION CONSULT NOTE  Pharmacy Consult for Heparin  Indication: arterial occlusion  Patient Measurements: Height: 5\' 7"  (170.2 cm) Weight: 152 lb (68.9 kg) IBW/kg (Calculated) : 66.1 Heparin Dosing Weight:  68.9 kg   Vital Signs: Temp: 99.2 F (37.3 C) (01/01 0605) Temp Source: Oral (01/01 0605) BP: 147/54 (01/01 0605) Pulse Rate: 74 (01/01 0605)  Labs: Recent Labs    06/28/18 1650 06/29/18 0100  06/30/18 0416 06/30/18 1246 06/30/18 2305 07/01/18 0449 07/01/18 0719  HGB 9.9* 9.7*  --  8.4*  --   --  9.3*  --   HCT 33.0* 32.7*  --  28.1*  --   --  31.0*  --   PLT 247 229  --  212  --   --  269  --   APTT 29  --   --   --   --   --   --   --   LABPROT 13.4  --   --   --   --   --   --   --   INR 1.03  --   --   --   --   --   --   --   HEPARINUNFRC  --  0.97*   < > 0.29* 0.20* 0.43  --  0.48  CREATININE 1.16 1.09  --  1.14  --   --  1.06  --   CKTOTAL 1,142* 1,085*  --  1,152*  --   --  2,198*  --    < > = values in this interval not displayed.    Estimated Creatinine Clearance: 45 mL/min (by C-G formula based on SCr of 1.06 mg/dL).   Medical History: Past Medical History:  Diagnosis Date  . Arthritis   . CKD (chronic kidney disease) stage 3, GFR 30-59 ml/min (HCC) 12/29/2015  . Dry gangrene (HCC) 06/16/2017   Great toe and 5th toe LEFT foot; Dr. Wyn Quakerew  . Hyperlipidemia   . Hypertension   . Left leg DVT (HCC) 01/2014  . Myocardial infarction (HCC)   . Pulmonary embolism (HCC) 01/2014  . Thoracic aorta atherosclerosis (HCC) 10/08/2016    Medications:  Medications Prior to Admission  Medication Sig Dispense Refill Last Dose  . atenolol (TENORMIN) 50 MG tablet TAKE 1 TABLET (50 MG TOTAL) BY MOUTH DAILY. 90 tablet 1 06/27/2018 at 0800  . atorvastatin (LIPITOR) 10 MG tablet TAKE 1 TABLET BY MOUTH EVERYDAY AT BEDTIME 90 tablet 1 06/27/2018 at 2000  . Cholecalciferol (VITAMIN D-1000 MAX ST) 1000 units tablet Take 5 tablets by mouth daily.   06/27/2018 at 0800   . clopidogrel (PLAVIX) 75 MG tablet Take 1 tablet (75 mg total) by mouth daily. Use INSTEAD of Eliquis 30 tablet 0 06/28/2018 at 0800  . donepezil (ARICEPT) 5 MG tablet Take 1 tablet by mouth every evening.  6 06/27/2018 at 2000  . loratadine (CLARITIN) 10 MG tablet Take 1 tablet (10 mg total) by mouth daily as needed for allergies.   06/27/2018 at 0800  . Omega-3 Fatty Acids (FISH OIL PO) Take 1 capsule by mouth 2 (two) times daily.   06/27/2018 at 0800  . triamcinolone lotion (KENALOG) 0.1 % Apply 1 application topically 3 (three) times daily. 120 mL 1 06/27/2018 at 0800  . vitamin B-12 (CYANOCOBALAMIN) 1000 MCG tablet Take 1 tablet by mouth daily.   06/27/2018 at 0800  . albuterol (PROVENTIL HFA;VENTOLIN HFA) 108 (90 Base) MCG/ACT inhaler Inhale 2 puffs into the lungs every 4 (four)  hours as needed for wheezing or shortness of breath. (Patient not taking: Reported on 06/28/2018) 1 Inhaler 1 Not Taking at Unknown time    Assessment: Pharmacy consulted to dose heparin in this 85 year old male with occluded artery.   No prior anticoag noted.  Hgb still trending downward with PLT wnl;  will continue to monitor.  Heparin Course: 12/29 PM initiation: 4800 unit bolus, then 1200 units/hr 12/30 0100 HL 0.97: dec rate to 1100 units/hr 12/30 0956 HL 0.73: dec rate to 1000 units/hr 12/30 2035 HL 0.43 12/31 0416 HL 0.29: increase rate to 1050 units/hr 12/31 1246 HL 0.20 12/31 2300 HL 0.43 01/01 0700 HL 0.48  Goal of Therapy:  Heparin level 0.3-0.7 units/ml Monitor platelets by anticoagulation protocol: Yes   Plan:  01/01 Heparin level 0.48 and therapeutic.  Will maintain current rate of heparin 1200 units/hr. Patient has had two consecutive therapeutic heparin levels and therefore will draw heparin level and CBC daily  Abbie Sons, PharmD Clinical Pharmacist 07/01/2018

## 2018-07-02 DIAGNOSIS — I25118 Atherosclerotic heart disease of native coronary artery with other forms of angina pectoris: Secondary | ICD-10-CM

## 2018-07-02 DIAGNOSIS — R0603 Acute respiratory distress: Secondary | ICD-10-CM

## 2018-07-02 DIAGNOSIS — I4891 Unspecified atrial fibrillation: Secondary | ICD-10-CM

## 2018-07-02 LAB — GLUCOSE, CAPILLARY: Glucose-Capillary: 154 mg/dL — ABNORMAL HIGH (ref 70–99)

## 2018-07-02 LAB — C-REACTIVE PROTEIN: CRP: 18.6 mg/dL — ABNORMAL HIGH (ref <1.0)

## 2018-07-02 LAB — BLOOD GAS, ARTERIAL
Acid-base deficit: 2.7 mmol/L — ABNORMAL HIGH (ref 0.0–2.0)
Bicarbonate: 20.4 mmol/L (ref 20.0–28.0)
FIO2: 0.36
O2 SAT: 94.5 %
Patient temperature: 37
pCO2 arterial: 30 mmHg — ABNORMAL LOW (ref 32.0–48.0)
pH, Arterial: 7.44 (ref 7.350–7.450)
pO2, Arterial: 70 mmHg — ABNORMAL LOW (ref 83.0–108.0)

## 2018-07-02 LAB — CBC
HCT: 31.3 % — ABNORMAL LOW (ref 39.0–52.0)
Hemoglobin: 9.3 g/dL — ABNORMAL LOW (ref 13.0–17.0)
MCH: 23.4 pg — ABNORMAL LOW (ref 26.0–34.0)
MCHC: 29.7 g/dL — ABNORMAL LOW (ref 30.0–36.0)
MCV: 78.6 fL — ABNORMAL LOW (ref 80.0–100.0)
Platelets: 300 10*3/uL (ref 150–400)
RBC: 3.98 MIL/uL — ABNORMAL LOW (ref 4.22–5.81)
RDW: 15.8 % — ABNORMAL HIGH (ref 11.5–15.5)
WBC: 17.5 10*3/uL — ABNORMAL HIGH (ref 4.0–10.5)
nRBC: 0 % (ref 0.0–0.2)

## 2018-07-02 LAB — HEMOGLOBIN A1C
Hgb A1c MFr Bld: 5.8 % — ABNORMAL HIGH (ref 4.8–5.6)
Mean Plasma Glucose: 119.76 mg/dL

## 2018-07-02 LAB — MRSA PCR SCREENING: MRSA by PCR: NEGATIVE

## 2018-07-02 LAB — HEPARIN LEVEL (UNFRACTIONATED): Heparin Unfractionated: 0.36 IU/mL (ref 0.30–0.70)

## 2018-07-02 LAB — SEDIMENTATION RATE: Sed Rate: 69 mm/hr — ABNORMAL HIGH (ref 0–20)

## 2018-07-02 LAB — TSH: TSH: 0.018 u[IU]/mL — ABNORMAL LOW (ref 0.350–4.500)

## 2018-07-02 LAB — CK: Total CK: 1178 U/L — ABNORMAL HIGH (ref 49–397)

## 2018-07-02 MED ORDER — VANCOMYCIN HCL IN DEXTROSE 1-5 GM/200ML-% IV SOLN
1000.0000 mg | INTRAVENOUS | Status: DC
  Administered 2018-07-02: 1000 mg via INTRAVENOUS
  Filled 2018-07-02 (×2): qty 200

## 2018-07-02 MED ORDER — AMIODARONE LOAD VIA INFUSION
150.0000 mg | Freq: Once | INTRAVENOUS | Status: AC
  Administered 2018-07-02: 150 mg via INTRAVENOUS
  Filled 2018-07-02: qty 83.34

## 2018-07-02 MED ORDER — IPRATROPIUM-ALBUTEROL 0.5-2.5 (3) MG/3ML IN SOLN
3.0000 mL | Freq: Four times a day (QID) | RESPIRATORY_TRACT | Status: DC
  Administered 2018-07-02 – 2018-07-03 (×3): 3 mL via RESPIRATORY_TRACT
  Filled 2018-07-02 (×3): qty 3

## 2018-07-02 MED ORDER — AMIODARONE HCL IN DEXTROSE 360-4.14 MG/200ML-% IV SOLN
30.0000 mg/h | INTRAVENOUS | Status: DC
  Administered 2018-07-02 – 2018-07-05 (×6): 30 mg/h via INTRAVENOUS
  Filled 2018-07-02 (×7): qty 200

## 2018-07-02 MED ORDER — FUROSEMIDE 10 MG/ML IJ SOLN
40.0000 mg | Freq: Two times a day (BID) | INTRAMUSCULAR | Status: DC
  Administered 2018-07-02 (×2): 40 mg via INTRAVENOUS
  Filled 2018-07-02 (×2): qty 4

## 2018-07-02 MED ORDER — CEFAZOLIN SODIUM-DEXTROSE 2-4 GM/100ML-% IV SOLN
2.0000 g | INTRAVENOUS | Status: DC
  Filled 2018-07-02: qty 100

## 2018-07-02 MED ORDER — FUROSEMIDE 10 MG/ML IJ SOLN
20.0000 mg | Freq: Once | INTRAMUSCULAR | Status: AC
  Administered 2018-07-02: 20 mg via INTRAVENOUS

## 2018-07-02 MED ORDER — DILTIAZEM HCL-DEXTROSE 100-5 MG/100ML-% IV SOLN (PREMIX)
5.0000 mg/h | INTRAVENOUS | Status: DC
  Administered 2018-07-02: 5 mg/h via INTRAVENOUS
  Administered 2018-07-03: 7.5 mg/h via INTRAVENOUS
  Filled 2018-07-02 (×2): qty 100

## 2018-07-02 MED ORDER — SODIUM CHLORIDE 0.9 % IV SOLN
2.0000 g | Freq: Two times a day (BID) | INTRAVENOUS | Status: DC
  Administered 2018-07-02 – 2018-07-05 (×7): 2 g via INTRAVENOUS
  Filled 2018-07-02 (×9): qty 2

## 2018-07-02 MED ORDER — CEFAZOLIN SODIUM-DEXTROSE 2-4 GM/100ML-% IV SOLN
INTRAVENOUS | Status: AC
  Filled 2018-07-02: qty 100

## 2018-07-02 MED ORDER — DILTIAZEM HCL 25 MG/5ML IV SOLN
10.0000 mg | Freq: Once | INTRAVENOUS | Status: AC
  Administered 2018-07-02: 10 mg via INTRAVENOUS
  Filled 2018-07-02: qty 5

## 2018-07-02 MED ORDER — METHYLPREDNISOLONE SODIUM SUCC 125 MG IJ SOLR
INTRAMUSCULAR | Status: AC
  Administered 2018-07-02: 60 mg
  Filled 2018-07-02: qty 2

## 2018-07-02 MED ORDER — LEVALBUTEROL HCL 0.63 MG/3ML IN NEBU
0.6300 mg | INHALATION_SOLUTION | Freq: Four times a day (QID) | RESPIRATORY_TRACT | Status: DC | PRN
  Administered 2018-07-02 – 2018-07-10 (×3): 0.63 mg via RESPIRATORY_TRACT
  Filled 2018-07-02 (×5): qty 3

## 2018-07-02 MED ORDER — VANCOMYCIN HCL IN DEXTROSE 1-5 GM/200ML-% IV SOLN
1000.0000 mg | Freq: Once | INTRAVENOUS | Status: AC
  Administered 2018-07-02: 1000 mg via INTRAVENOUS
  Filled 2018-07-02: qty 200

## 2018-07-02 MED ORDER — FUROSEMIDE 10 MG/ML IJ SOLN
INTRAMUSCULAR | Status: AC
  Administered 2018-07-02: 20 mg via INTRAVENOUS
  Filled 2018-07-02: qty 2

## 2018-07-02 MED ORDER — DILTIAZEM HCL-DEXTROSE 100-5 MG/100ML-% IV SOLN (PREMIX)
5.0000 mg/h | INTRAVENOUS | Status: DC
  Administered 2018-07-02: 5 mg/h via INTRAVENOUS
  Filled 2018-07-02 (×2): qty 100

## 2018-07-02 MED ORDER — SODIUM CHLORIDE 0.9 % IV SOLN: INTRAVENOUS | Status: DC

## 2018-07-02 MED ORDER — AMIODARONE HCL IN DEXTROSE 360-4.14 MG/200ML-% IV SOLN
60.0000 mg/h | INTRAVENOUS | Status: DC
  Administered 2018-07-02: 60 mg/h via INTRAVENOUS
  Filled 2018-07-02: qty 200

## 2018-07-02 MED ORDER — METHYLPREDNISOLONE SODIUM SUCC 125 MG IJ SOLR
60.0000 mg | Freq: Four times a day (QID) | INTRAMUSCULAR | Status: DC
  Administered 2018-07-02 – 2018-07-07 (×21): 60 mg via INTRAVENOUS
  Filled 2018-07-02 (×21): qty 2

## 2018-07-02 NOTE — Consult Note (Addendum)
Reason for Consult: Shortness of breath with rapid atrial fibrillation Referring Physician: Dr. Loralyn Freshwater [name] [name] is an 85 y.o. male.  HPI: Mr. [name] [name] is an 85 year old gentleman with a past medical history remarkable for hypertension, hyperlipidemia, coronary artery disease, thromboembolic disease, severe peripheral arterial disease, chronic renal failure presented with left-sided leg pain beginning 2 days prior to presentation.  This was associated with claudication with eventual inability to ambulate.  Associated complaints of shortness of breath, wheezing.  Was going to have angiography today upon arrival was noted to be in rapid atrial fibrillation with uncontrolled ventricular response along with severe dyspnea.  Patient is being transferred to the intensive care unit for further management.  Past Medical History:  Diagnosis Date  . Arthritis   . CKD (chronic kidney disease) stage 3, GFR 30-59 ml/min (HCC) 12/29/2015  . Dry gangrene (HCC) 06/16/2017   Great toe and 5th toe LEFT foot; Dr. Wyn Quaker  . Hyperlipidemia   . Hypertension   . Left leg DVT (HCC) 01/2014  . Myocardial infarction (HCC)   . Pulmonary embolism (HCC) 01/2014  . Thoracic aorta atherosclerosis (HCC) 10/08/2016    Past Surgical History:  Procedure Laterality Date  . COLONOSCOPY    . CORONARY STENT PLACEMENT  2006  . JOINT REPLACEMENT  2011 & 2012   both hips  . len replaced Left 2015  . LOWER EXTREMITY ANGIOGRAPHY N/A 05/26/2017   Procedure: Lower Extremity Angiography;  Surgeon: Annice Needy, MD;  Location: [facility] INVASIVE CV LAB;  Service: Cardiovascular;  Laterality: N/A;  . LOWER EXTREMITY ANGIOGRAPHY Left 11/10/2017   Procedure: LOWER EXTREMITY ANGIOGRAPHY;  Surgeon: Annice Needy, MD;  Location: [facility] INVASIVE CV LAB;  Service: Cardiovascular;  Laterality: Left;  Marland Kitchen MELANOMA EXCISION     40 years ago    Family History  Problem Relation Age of Onset  . Arthritis Mother   . Arthritis Father   . Depression  Father   . Hypertension Daughter   . Pulmonary Hypertension Daughter     Social History:  reports that he quit smoking about 50 years ago. His smoking use included pipe. He has never used smokeless tobacco. He reports that he does not drink alcohol or use drugs.  Allergies:  Allergies  Allergen Reactions  . Aspirin Anaphylaxis, Swelling and Shortness Of Breath    REACTION: terrible bp reaction/wheezing  . Bee Pollen Other (See Comments)    ragweed  . Pollen Extract     ragweed  . Rabbit Epithelium     rabbits    Medications: I have reviewed the patient's current medications.  Results for orders placed or performed during the hospital encounter of 06/28/18 (from the past 48 hour(s))  Heparin level (unfractionated)     Status: Abnormal   Collection Time: 06/30/18 12:46 PM  Result Value Ref Range   Heparin Unfractionated 0.20 (L) 0.30 - 0.70 IU/mL    Comment: (NOTE) If heparin results are below expected values, and patient dosage has  been confirmed, suggest follow up testing of antithrombin III levels. Performed at [facility], [address] [place], [place] [address]   Heparin level (unfractionated)     Status: None   Collection Time: 06/30/18 11:05 PM  Result Value Ref Range   Heparin Unfractionated 0.43 0.30 - 0.70 IU/mL    Comment: (NOTE) If heparin results are below expected values, and patient dosage has  been confirmed, suggest follow up testing of antithrombin III levels. Performed at [facility], [phone]  [address] [place], [place] [address]   Basic metabolic panel     Status: Abnormal   Collection Time: 07/01/18  4:49 AM  Result Value Ref Range   Sodium 137 135 - 145 mmol/L   Potassium 3.9 3.5 - 5.1 mmol/L   Chloride 110 98 - 111 mmol/L   CO2 21 (L) 22 - 32 mmol/L   Glucose, Bld 116 (H) 70 - 99 mg/dL   BUN 28 (H) 8 - 23 mg/dL   Creatinine, Ser 6.041.06 0.61 - 1.24 mg/dL   Calcium 8.2 (L) 8.9 - 10.3 mg/dL   GFR calc non Af Amer >60 >60  mL/min   GFR calc Af Amer >60 >60 mL/min   Anion gap 6 5 - 15    Comment: Performed at [facility], [address] [place], [place] [address]  CBC with Differential/Platelet     Status: Abnormal   Collection Time: 07/01/18  4:49 AM  Result Value Ref Range   WBC 19.2 (H) 4.0 - 10.5 K/uL   RBC 3.93 (L) 4.22 - 5.81 MIL/uL   Hemoglobin 9.3 (L) 13.0 - 17.0 g/dL   HCT 11.931.0 (L) 14.739.0 - 82.952.0 %   MCV 78.9 (L) 80.0 - 100.0 fL   MCH 23.7 (L) 26.0 - 34.0 pg   MCHC 30.0 30.0 - 36.0 g/dL   RDW 56.215.7 (H) 13.011.5 - 86.515.5 %   Platelets 269 150 - 400 K/uL   nRBC 0.0 0.0 - 0.2 %   Neutrophils Relative % 81 %   Neutro Abs 15.6 (H) 1.7 - 7.7 K/uL   Lymphocytes Relative 8 %   Lymphs Abs 1.5 0.7 - 4.0 K/uL   Monocytes Relative 9 %   Monocytes Absolute 1.8 (H) 0.1 - 1.0 K/uL   Eosinophils Relative 0 %   Eosinophils Absolute 0.0 0.0 - 0.5 K/uL   Basophils Relative 0 %   Basophils Absolute 0.0 0.0 - 0.1 K/uL   WBC Morphology MORPHOLOGY UNREMARKABLE    Smear Review MORPHOLOGY UNREMARKABLE    Immature Granulocytes 2 %   Abs Immature Granulocytes 0.29 (H) 0.00 - 0.07 K/uL   Polychromasia PRESENT     Comment: Performed at [facility], [address] [place], [place] [address]  CK     Status: Abnormal   Collection Time: 07/01/18  4:49 AM  Result Value Ref Range   Total CK 2,198 (H) 49 - 397 U/L    Comment: Performed at [facility], [address] [place], [place] [address]  Heparin level (unfractionated)     Status: None   Collection Time: 07/01/18  7:19 AM  Result Value Ref Range   Heparin Unfractionated 0.48 0.30 - 0.70 IU/mL    Comment: (NOTE) If heparin results are below expected values, and patient dosage has  been confirmed, suggest follow up testing of antithrombin III levels. Performed at [facility], [address] [place], [place] [address]   CBC     Status: Abnormal   Collection Time: 07/02/18  4:39 AM  Result Value Ref Range   WBC 17.5 (H) 4.0  - 10.5 K/uL   RBC 3.98 (L) 4.22 - 5.81 MIL/uL   Hemoglobin 9.3 (L) 13.0 - 17.0 g/dL   HCT 44.031.3 (L) 10.239.0 - 72.552.0 %   MCV 78.6 (L) 80.0 - 100.0 fL   MCH 23.4 (L) 26.0 - 34.0 pg   MCHC 29.7 (L) 30.0 - 36.0 g/dL   RDW 36.615.8 (H) 44.011.5 - 34.715.5 %   Platelets  300 150 - 400 K/uL   nRBC 0.0 0.0 - 0.2 %    Comment: Performed at [facility], [address] [place], [place] [address]  Heparin level (unfractionated)     Status: None   Collection Time: 07/02/18  4:39 AM  Result Value Ref Range   Heparin Unfractionated 0.36 0.30 - 0.70 IU/mL    Comment: (NOTE) If heparin results are below expected values, and patient dosage has  been confirmed, suggest follow up testing of antithrombin III levels. Performed at [facility], [address] [place], [place] [address]   CK     Status: Abnormal   Collection Time: 07/02/18  4:39 AM  Result Value Ref Range   Total CK 1,178 (H) 49 - 397 U/L    Comment: Performed at [facility], [address] [place], [place] [address]  Blood gas, arterial     Status: Abnormal   Collection Time: 07/02/18 10:11 AM  Result Value Ref Range   FIO2 0.36    Delivery systems NASAL CANNULA    pH, Arterial 7.44 7.350 - 7.450   pCO2 arterial 30 (L) 32.0 - 48.0 mmHg   pO2, Arterial 70 (L) 83.0 - 108.0 mmHg   Bicarbonate 20.4 20.0 - 28.0 mmol/L   Acid-base deficit 2.7 (H) 0.0 - 2.0 mmol/L   O2 Saturation 94.5 %   Patient temperature 37.0    Collection site REVIEWED BY    Sample type ARTERIAL DRAW    Allens test (pass/fail) PASS PASS    Comment: Performed at [facility], [address] [place], [place] [address]    Dg Chest 2 View  Result Date: 06/30/2018 CLINICAL DATA:  Wheezing, hypoxia, former smoker. Previous MI. Patient was hospitalized 2 days ago due to a fall. EXAM: CHEST - 2 VIEW COMPARISON:  PA and lateral chest x-ray of June 14, 2017 FINDINGS: Today's study is obtained in a more lordotic fashion. The lungs are  well-expanded. The interstitial markings are coarse. There is thickening of the minor fissure. There is patchy increased density at the left lung base. Previously demonstrated density in the right upper lobe is less conspicuous today. The heart and pulmonary vascularity are normal. There is calcification in the wall of the aortic arch. IMPRESSION: Chronic interstitial changes bilaterally. Slightly increased interstitial density at the left lung base may reflect developing pneumonia. Thoracic aortic atherosclerosis. Electronically Signed   By: David  Swazil[place]Jordan M.D.   On: 06/30/2018 14:32    ROS  Unable to obtain review of systems at this time secondary to dyspnea  Blood pressure 127/88, pulse 98, temperature 97.6 F (36.4 C), temperature source Oral, resp. rate 20, height 5\' 7"  (1.702 m), weight 68.9 kg, SpO2 94 %.   Physical Exam Vital signs: Please see the above listed vital signs HEENT: Patient with labored respirations, accessory muscle utilization, presently on nasal cannula, no oral lesions noted, no jugular venous distention appreciated Cardiovascular: Irregularly irregular rhythm with rapid ventricular response.  Presently on Cardizem at 7.5 Pulmonary: Diffuse expiratory rhonchi and wheezing appreciated bilateral Abdominal: Positive bowel sounds, soft exam Extremities: Diffuse pain noted on his left leg, rash appreciated, please see picture in chart.  Unable to palpate pulses in his left lower extremity.  Right lower extremity there are some dorsalis pedis pulses.  Pending angiography Neurologic: Patient moves all extremities, cranial nerves are grossly intact of any focal deficits appreciated Cutaneous: Diffuse maculopapular rash appreciated in particular in his left lower extremity associated with scaling also  noted in his right lower extremity.  Unclear etiology  Assessment/Plan:  Severe peripheral arterial disease.  Pending angiogram, on heparin infusion.  With a combination of vascular  disease and cutaneous symptoms will send off inflammatory markers, ANA, rheumatoid factor, complements, cryoglobulins.  Respiratory distress.  Bronchospastic, will start on BiPAP, albuterol, Atrovent, Solu-Medrol  Atrial fibrillation with rapid ventricular response.  Patient with stable hemodynamics, will continue with Cardizem and adjust for ventricular response.  Follow - up EKG and troponin  Diffuse rash, treated with prednisone now on Solu-Medrol.  Empirically will send serologic studies for inflammation/vasculitis  Elevated CK at 1178 which has decreased from 2198, positive one-point 4 L output with normal creatinine will follow  Leudocytosis with a white count of 19.2.  Patient is empirically on Levaquin will broaden out coverage  Anemia hemoglobin 9.3 no clear evidence of bleeding  Critical care time 35 minutes  Tora Kindred, DO  Bryanah Sidell 07/02/2018, 11:02 AM

## 2018-07-02 NOTE — Progress Notes (Signed)
Patient complains of SOB on rest; HOB 90'; O2 at 3Lnc; Bilateral inspiratory/expiratory wheezes; encouraged slowing RR and deep breathing; Respiratory Therapist, Roe Coombs called to come assess patient; SVN given per RT; Windy Carina, RN. 6:48 AM 07/02/2018

## 2018-07-02 NOTE — OR Nursing (Signed)
Wheezes audible upon arrival and ausculatated throughout. Placed on cardiac monitor which displays atrial fibrillation with bursts of rate of up to 156. Pt does not tolerate laying flat. Dr Wyn Quaker Notified and will assess pt. Pt denies history of heart disease or irregular heart rate.

## 2018-07-02 NOTE — Consult Note (Addendum)
Cardiology Consultation:   Patient ID: JIMMEL STUPAK MRN: [id number]; DOB: 07/01/1928  Admit date: 06/28/2018 Date of Consult: 07/02/2018  Primary Care Provider: Kerman Passey, MD Primary Cardiologist: Yvonne Kendall, MD  Primary Electrophysiologist:  None    Patient Profile:   [name] [name] is a 85 y.o. 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 Wyn Quaker), chronic PVD s/p SFA/popliteal stent, b/l hip replacement, h/o LLE DVT, h/o PE, former smoker (quit 1970), and h/o remote melanoma who is being seen today for the evaluation of Afib with RVR at the request of Dr. Amado Coe.  History of Present Illness:   Mr. Suddarth is an 85 year old male with PMH as above.  He was previously followed by Dr. Shirlee Latch.  With last appointment 2011.  He reestablished care with Dr. Okey Dupre on 08/13/2017.  He has a history of recent lower extremity wounds and peripheral vascular disease managed by the foot [name] and vascular surgery.  At the time of his last cardiology appointment 08/2017, he was most concerned about poor circulation in his toes.  He continued to struggle with poor wound healing and gangrene on the left foot.  He had associated swelling of the left calf.  He reportedly was doing well from a heart standpoint and denied chest pain or shortness of breath.  His daughter, who accompanied him at the time of his appointment, felt that his father frequently appears short of breath with mild activity however.  He denied orthopnea and PND.  It was noted that he fell 05/2017 while trying to adjust curtains and therefore was ambulating with a rolling walker. PTA medications included apixaban prescribed by Dr. Amada Jupiter of vascular surgery following implantation of covered stent.    Per family, patient had reportedly decreased po intake over the last few days (end of December 2019). Also, per family, the patient was documented as complaining of severe left leg pain for the past 2 days that prevented ambulation.  Per family, patient was able to ambulate before 12/27 and the start of this left sided leg rash. Per wife, patient also has been wheezing. Of note, patient was also previously noted to be anemic and instructed to complete guaiac cards via PCP. Also, per wife, patient was started on Plavix after Eliquis discontinued d/t presumed allergic reaction with full body rash.  On 06/28/2018, patient presented to [facility] ED via EMS with left leg pain s/p a fall the previous evening of 12/28 while trying to get up from seated position in bed. The family was unable to assist him back into bed that night; therefore, he spent the night on the ground. In the AM (12/29), the family called 911 for further assistance and transport to the ED. On EMS arrival, the patient was found on the floor. No reported LOC. The family did not report that the patient hit his head. In the ED / per ED triage notes, the patient was documented as having a L sided erythmeatous and non-blanching rash down the anterior aspect of his left leg and on the dorsum of his left foot. The patient reported severe left sided leg pain, extending from L lateral knee into the L foot. This pain was described as worse with movement and TTP. Per ED exam, the left  leg was documented as cool to the touch below the knee.  It was suspected that patient had left-sided acute ischemic limb given cold, cyanotic extremity from the knee down, delayed capillary refill, purpuric nonblanching rash, and severe tenderness to palpation.  In the ED Vitals: BP 153/50, HR 78, RR 19, T99F, SPO2 100% Labs: WBC 13.6, RBC 4.18, hemoglobin 9.9, hematocrit 33.0, glucose 116, AST 50, total CK 1142, lactic acid 2.16, blood cultures pending EKG: Documented as  atrial flutter with ventricular rate 77 and no acute ST elevations or depression CXR: N/a Meds: Started on heparin with vascular surgery paged for intervention.   Cardiology consulted after vascular surgery angiogram deferred today 1/2, d/t Afib with RVR.   *Allergies include ASA and wife reported eliquis allergy.    *PTA medications included atenolol 50 mg p.o. daily, atorvastatin 10 mg p.o. q. 1800, Plavix 75 mg p.o. daily instead of Eliquis   Past Medical History:  Diagnosis Date  . Arthritis   . CKD (chronic kidney disease) stage 3, GFR 30-59 ml/min (HCC) 12/29/2015  . Dry gangrene (HCC) 06/16/2017   Great toe and 5th toe LEFT foot; Dr. Wyn Quakerew  . Hyperlipidemia   . Hypertension   . Left leg DVT (HCC) 01/2014  . Myocardial infarction (HCC)   . Pulmonary embolism (HCC) 01/2014  . Thoracic aorta atherosclerosis (HCC) 10/08/2016    Past Surgical History:  Procedure Laterality Date  . COLONOSCOPY    . CORONARY STENT PLACEMENT  2006  . JOINT REPLACEMENT  2011 & 2012   both hips  . len replaced Left 2015  . LOWER EXTREMITY ANGIOGRAPHY N/A 05/26/2017   Procedure: Lower Extremity Angiography;  Surgeon: Annice Needyew, Jason S, MD;  Location: [facility] INVASIVE CV LAB;  Service: Cardiovascular;  Laterality: N/A;  . LOWER EXTREMITY ANGIOGRAPHY Left 11/10/2017   Procedure: LOWER EXTREMITY ANGIOGRAPHY;  Surgeon: Annice Needyew, Jason S, MD;  Location: [facility] INVASIVE CV LAB;  Service: Cardiovascular;  Laterality: Left;  Marland Kitchen. MELANOMA EXCISION     40 years ago     Home Medications:  Prior to Admission medications   Medication Sig Start Date End Date Taking? Authorizing Provider  atenolol (TENORMIN) 50 MG tablet TAKE 1 TABLET (50 MG TOTAL) BY MOUTH DAILY. 05/25/18  Yes Lada, Janit BernMelinda P, MD  atorvastatin (LIPITOR) 10 MG tablet TAKE 1 TABLET BY MOUTH EVERYDAY AT BEDTIME 05/06/18  Yes Poulose, Percell BeltElizabeth E, NP  Cholecalciferol (VITAMIN D-1000 MAX ST) 1000 units tablet Take 5 tablets by mouth daily.   Yes [provider]  clopidogrel (PLAVIX) 75 MG tablet Take 1 tablet (75 mg total) by mouth daily. Use INSTEAD of Eliquis 06/22/18  Yes Lada, Janit BernMelinda P, MD  donepezil (ARICEPT) 5 MG tablet Take 1 tablet by mouth every evening. 04/27/17  Yes [provider]  loratadine (CLARITIN) 10 MG tablet Take 1 tablet (10 mg total) by mouth daily as needed for allergies. 11/19/16  Yes Lada, Janit BernMelinda P, MD  Omega-3 Fatty Acids (FISH OIL PO) Take 1 capsule by mouth 2 (two) times daily.   Yes [provider]  triamcinolone lotion (KENALOG) 0.1 % Apply 1 application topically 3 (three) times daily. 06/18/18  Yes Lada, Janit BernMelinda P, MD  vitamin B-12 (CYANOCOBALAMIN) 1000 MCG tablet Take 1 tablet by mouth daily.   Yes [provider]  albuterol (PROVENTIL HFA;VENTOLIN HFA) 108 (90 Base) MCG/ACT inhaler Inhale 2 puffs into the lungs every 4 (four) hours as needed for wheezing or shortness of breath. Patient not taking: Reported on 06/28/2018 11/19/16  Kerman Passey, MD    Inpatient Medications: Scheduled Meds: . atenolol  50 mg Oral Daily  . cholecalciferol  5,000 Units Oral Daily  . clopidogrel  75 mg Oral Daily  . donepezil  5 mg Oral QPM  . furosemide  40 mg Intravenous BID  . ipratropium-albuterol  3 mL Nebulization Q6H  . methylPREDNISolone (SOLU-MEDROL) injection  60 mg Intravenous Q6H  . omega-3 acid ethyl esters  1 g Oral Daily  . senna-docusate  1 tablet Oral BID  . sodium chloride flush  3 mL Intravenous Q12H  . vitamin B-12  1,000 mcg Oral Daily   Continuous Infusions: . sodium chloride    . sodium chloride 75 mL/hr at 07/02/18 0659  . amiodarone     Followed by  . amiodarone    . ceFEPime (MAXIPIME) IV 2 g (07/02/18 1235)  . heparin 1,200 Units/hr (07/02/18 0659)  . vancomycin     PRN Meds: sodium chloride, acetaminophen **OR** acetaminophen, levalbuterol, loratadine, ondansetron **OR** ondansetron (ZOFRAN) IV, oxyCODONE, sodium chloride flush  Allergies:    Allergies  Allergen  Reactions  . Aspirin Anaphylaxis, Swelling and Shortness Of Breath    REACTION: terrible bp reaction/wheezing  . Bee Pollen Other (See Comments)    ragweed  . Pollen Extract     ragweed  . Rabbit Epithelium     rabbits    Social History:   Social History   Socioeconomic History  . Marital status: Married    Spouse name: Not on file  . Number of children: Not on file  . Years of education: Not on file  . Highest education level: Not on file  Occupational History    Employer: RETIRED  Social Needs  . Financial resource strain: Not on file  . Food insecurity:    Worry: Not on file    Inability: Not on file  . Transportation needs:    Medical: Not on file    Non-medical: Not on file  Tobacco Use  . Smoking status: Former Smoker    Types: Pipe    Last attempt to quit: 1970    Years since quitting: 50.0  . Smokeless tobacco: Never Used  Substance and Sexual Activity  . Alcohol use: No    Alcohol/week: 0.0 standard drinks  . Drug use: No  . Sexual activity: Not Currently  Lifestyle  . Physical activity:    Days per week: Not on file    Minutes per session: Not on file  . Stress: Not on file  Relationships  . Social connections:    Talks on phone: Not on file    Gets together: Not on file    Attends religious service: Not on file    Active member of club or organization: Not on file    Attends meetings of clubs or organizations: Not on file    Relationship status: Not on file  . Intimate partner violence:    Fear of current or ex partner: Not on file    Emotionally abused: Not on file    Physically abused: Not on file    Forced sexual activity: Not on file  Other Topics Concern  . Not on file  Social History Narrative  . Not on file    Family History:    Family History  Problem Relation Age of Onset  . Arthritis Mother   . Arthritis Father   . Depression Father   . Hypertension Daughter   . Pulmonary Hypertension Daughter  ROS:  Please see the  history of present illness.  Review of Systems  Unable to perform ROS: Mental status change  Cardiovascular: Positive for claudication and leg swelling.  Genitourinary:       Difficulty urinating, reported during today's consultation  Musculoskeletal: Positive for falls and joint pain.       Unable to ambulate d/t LLE pain  Skin: Positive for itching and rash.       PVD, mottled and erythematous LLE edema.   Dry non-erythematous RLE and RLE edema..   Neurological: Positive for tremors and weakness.  Psychiatric/Behavioral: Positive for memory loss.  All other systems reviewed and are negative.   All other ROS reviewed and negative.     Physical Exam/Data:   Vitals:   07/02/18 1000 07/02/18 1015 07/02/18 1030 07/02/18 1125  BP: (!) 153/67 112/62 127/88 (!) 155/65  Pulse: (!) 107 (!) 108 98 95  Resp: (!) 22 (!) 29 20 (!) 24  Temp:    99 F (37.2 C)  TempSrc:    Oral  SpO2: 94% 93% 94% 98%  Weight:    68.8 kg  Height:    5\' 7"  (1.702 m)    Intake/Output Summary (Last 24 hours) at 07/02/2018 1302 Last data filed at 07/02/2018 1243 Gross per 24 hour  Intake 2108.07 ml  Output 1400 ml  Net 708.07 ml   Filed Weights   06/28/18 1553 07/02/18 1125  Weight: 68.9 kg 68.8 kg   Body mass index is 23.76 kg/m.  General:  Frail elderly male on Bipap HEENT: normal Neck: no JVD Vascular: No carotid bruits Cardiac:  IRIR; no murmur  Lungs:  Bilateral wheezing, rhonchi Abd: soft, nontender, no hepatomegaly  Ext: bilateral LEE with R>L. Cool and mottled L foot and erythema noted on left lower leg. Right leg dry with rash Musculoskeletal:  No deformities Skin: cool on L leg with changes noted above in extremity portion of exam  Neuro:  no focal abnormalities noted. Clear and coherent Psych:  Normal affect   EKG:  The EKG was personally reviewed and demonstrates:  As above in HPI Telemetry:  Telemetry was personally reviewed and demonstrates:  Afib with RVR with ventricular rate  100-145 bpm  Relevant CV Studies: 09/04/2017 TTE Study Conclusions - Left ventricle: The cavity size was normal. Systolic function was   normal. The estimated ejection fraction was in the range of 55%   to 60%. Wall motion was normal; there were no regional wall   motion abnormalities. Doppler parameters are consistent with   abnormal left ventricular relaxation (grade 1 diastolic   dysfunction). - Aortic valve: Transvalvular velocity was within the normal range.   there was sclerosis without significant stenosis. - Left atrium: The atrium was normal in size. - Right ventricle: Systolic function was normal. - Pulmonary arteries: Systolic pressure was within the normal   range.  09/24/02 LHC  LMCA with 20% distal stenosis.  LAD is a large vessel giving off a single diagonal branch after which the LAD is chronically occluded.  Diagonal has 50% sequential lesions.  LCx is a large vessel with 30% proximal stenosis.  OM1 has a 40% stenosis.  RCA is a large vessel with a widely patent stent in its distal segment.  There is a 40% lesion proximal to the stent.  LVEF 60% with mild inferior hypokinesis.  Laboratory Data:  Chemistry Recent Labs  Lab 06/29/18 0100 06/30/18 0416 07/01/18 0449  NA 142 139 137  K 4.2 4.0 3.9  CL 112* 110 110  CO2 25 21* 21*  GLUCOSE 122* 124* 116*  BUN 26* 29* 28*  CREATININE 1.09 1.14 1.06  CALCIUM 8.7* 8.3* 8.2*  GFRNONAA >60 57* >60  GFRAA >60 >60 >60  ANIONGAP 5 8 6     Recent Labs  Lab 06/28/18 1650  PROT 7.1  ALBUMIN 3.7  AST 50*  ALT 27  ALKPHOS 65  BILITOT 0.5   Hematology Recent Labs  Lab 06/30/18 0416 07/01/18 0449 07/02/18 0439  WBC 14.5* 19.2* 17.5*  RBC 3.61* 3.93* 3.98*  HGB 8.4* 9.3* 9.3*  HCT 28.1* 31.0* 31.3*  MCV 77.8* 78.9* 78.6*  MCH 23.3* 23.7* 23.4*  MCHC 29.9* 30.0 29.7*  RDW 15.5 15.7* 15.8*  PLT 212 269 300   Cardiac EnzymesNo results for input(s): TROPONINI in the last 168 hours. No results for input(s):  TROPIPOC in the last 168 hours.  BNPNo results for input(s): BNP, PROBNP in the last 168 hours.  DDimer No results for input(s): DDIMER in the last 168 hours.  Radiology/Studies:  Dg Chest 2 View  Result Date: 06/30/2018 CLINICAL DATA:  Wheezing, hypoxia, former smoker. Previous MI. Patient was hospitalized 2 days ago due to a fall. EXAM: CHEST - 2 VIEW COMPARISON:  PA and lateral chest x-ray of June 14, 2017 FINDINGS: Today's study is obtained in a more lordotic fashion. The lungs are well-expanded. The interstitial markings are coarse. There is thickening of the minor fissure. There is patchy increased density at the left lung base. Previously demonstrated density in the right upper lobe is less conspicuous today. The heart and pulmonary vascularity are normal. There is calcification in the wall of the aortic arch. IMPRESSION: Chronic interstitial changes bilaterally. Slightly increased interstitial density at the left lung base may reflect developing pneumonia. Thoracic aortic atherosclerosis. Electronically Signed   By: David  SwazilandJordan M.D.   On: 06/30/2018 14:32    Assessment and Plan:   Paroxysmal Afib with RVR with associated SOB -Symptomatic when in Afib with SOB, feelings of rapid heart rate. Likely onset of Afib overnight, given rate jump from 70s to 100s.  Known h/o paroxysmal Afib; however, SR on presentation and also SR 08/2017 visit with Dr. Okey DupreEnd.  - Telemetry shows Afib with ventricular rates 100-145 bpm. In SR until last night, per review of flowsheets. Started on amiodarone by ICU. Continue with cardizem drip as needed for rate control. Continue PTA atenolol. Previous echo as above.  - Etiology of Afib / ddx includes triggers of infection with ongoing left lower extremity infection / chronic left lower extremity wounds d/t PVD and gangrene of toe. Elevated WBC/ lactic acid on presentation with SOB also likely multifactorial as chest x-ray showed chronic interstitial changes  bilaterally and slightly increased interstitial density of left lung base with possible developing of pneumonia with elevated WBC.  - CHA2DS2VASc score of at least 4 (HTN, agex2, vascular).  Previously on Eliquis; however, this was discontinued due to full body rash per PCP.  Recommendation for anticoagulation at discharge if anemia allows and bleed ruled out but with consideration of vascular surgery procedure given history of thrombosis and above CHA2DsVASc score. On heparin in the ICU. Continue with daily CBC as patient with known recent anemia. --Recommend updating TSH with most recent lab 05/2017 showing TSH 3.047.  Glucose 116 on presentation.  Recommend obtaining A1c. Daily BMET to monitor renal function, electrolytes with known CKD. Monitor I/o, daily weights. - Continue amiodarone and cardizem, as above for rate control and as patient  may convert back into SR (in Afib less than 48h). Continue IV lasix for LEE. Patient does not appear terribly volume overloaded on today's examination. On Bipap therapy due -->continue for SOB and hypoxia.  Continue to monitor O2 sats.  Continue breathing treatments including steroids with recommendation to wean as tolerated as could contribute to elevated HR and elevated WBC.   Leukocytosis -Recommend continue antibiotics given chest x-ray shows possible pneumonia and elevated WBC on presentation with ddx for leukocytosis pneumonia versus left lower extremity wounds / gangrene.  Continue abx and medical management as above. Defer management to IM, critical care team.  CAD -No reported chest pain.  LHC 09/24/02 as above.  Thoracic aortic atherosclerosis per imaging.  EKG without acute changes.  On Plavix per vascular surgery for peripheral vascular stenting.  Given patient's age, comorbid conditions, and lack of symptoms, no indication for urgent invasive ischemic work-up at this time. Recommend outpatient cardiology follow-up as scheduled in the office. Chest x-ray  also noted known thoracic aortic atherosclerosis. Could consider ischemic workup as outpatient if chest pain reported in future. Statin therapy recommended as below.   HTN - Uncontrolled with BP 165/65. Titrate antihypertensives as tolerated for BP control. Continue to monitor vitals  HLD - Given CAD and PAD, low to moderate intensity statin recommended per guidelines.  If started on statin therapy, recommend baseline labs including baseline lipid and liver panel. Per most recent labs this admission, AST elevated.  - Per PCP, vascular surgery.  L leg claudication, PVD with L toe gangrene and h/o DVT/PE  -Originally scheduled for angiogram on 1/2, deferred d/t Afib.  Left-sided lower extremity poor perfusion /peripheral vascular disease and associated edema L>R. On Plavix for PVD stenting after Eliquis stopped d/t rash. Per vascular surgery   CKD3 - Daily BMET. Cr 1.06  Anemia - Monitor. CBC. Per IM    For questions or updates, please contact [facility] Please consult [email or web address] for contact info under     Signed, Lennon Alstrom, PA-C  07/02/2018 1:02 PM

## 2018-07-02 NOTE — Consult Note (Signed)
Pharmacy Antibiotic Note  [name] [name] is a 85 y.o. male admitted on 06/28/2018 with severe PAD, arterial occlusion and possible PNA.  Patient has leukocytosis and has been receiving levofloxacin.  Pharmacy now consulted for vancomycin and cefepime dosing.     Plan: Vancomycin 1000 IV every 24 hours with 6 hour stack dosing.  Goal trough 10-15 mcg/mL. Calculated trough @ Css is 14.5. Trough level prior to 4th dose. Pharmacy will monitor renal function and adjust dose as needed.   Start Cefepime 2g IV every 12 hours.    Kinetics:   Ke:0.042    Vd: 48    T1/2: 16.5  Height: 5\' 7"  (170.2 cm) Weight: 151 lb 10.8 oz (68.8 kg) IBW/kg (Calculated) : 66.1  Temp (24hrs), Avg:98 F (36.7 C), Min:97.5 F (36.4 C), Max:99 F (37.2 C)  Recent Labs  Lab 06/28/18 1650 06/28/18 1659 06/29/18 0100 06/29/18 0502 06/30/18 0416 07/01/18 0449 07/02/18 0439  WBC 13.6*  --  14.6*  --  14.5* 19.2* 17.5*  CREATININE 1.16  --  1.09  --  1.14 1.06  --   LATICACIDVEN  --  2.16*  --  1.2  --   --   --     Estimated Creatinine Clearance: 45 mL/min (by C-G formula based on SCr of 1.06 mg/dL).    Allergies  Allergen Reactions  . Aspirin Anaphylaxis, Swelling and Shortness Of Breath    REACTION: terrible bp reaction/wheezing  . Bee Pollen Other (See Comments)    ragweed  . Pollen Extract     ragweed  . Rabbit Epithelium     rabbits    Antimicrobials this admission: 1/1 levofloxacin >> 1/2 1/2 vancomycin >>   1/2 cefepime>>   Dose adjustments this admission:   Microbiology results: 12/29 BCx: gram variable rods, Bacillus spp 01/02 MRSA PCR: pending  Thank you for allowing pharmacy to be a part of this patient's care.  Gardner Candle, PharmD, BCPS Clinical Pharmacist 07/02/2018 12:07 PM

## 2018-07-02 NOTE — Progress Notes (Signed)
Patient came down for angiogram this morning with profound shortness of breath and tachycardia.  Was found to be in rapid atrial fibrillation with heart rate in the 120s and 130s and having significant oxygen requirement with tachypnea.  Both feet somewhat cool likely due to poor perfusion with tachycardia and hypoxia.  We will have to hold on angiogram for now.  Will contact the medical service to see if they want to continue a Cardizem drip or how they would prefer to manage the atrial fibrillation.

## 2018-07-02 NOTE — Progress Notes (Signed)
Pt wanting bipap off , placed on 5lpm Casey, sats 93%, respiratory rate 24/min, will continue to monitor, RN made aware.

## 2018-07-02 NOTE — OR Nursing (Signed)
Pt transferred to ICU3, after recived breathing treatment, ABG and cardiazem infusion started. (pt had two small solid BMs one in bedpan one when coughed incontinent.

## 2018-07-02 NOTE — OR Nursing (Signed)
Dr dew evaluated pt, lasix and Cardiazem iv ordered. Condom cath placed. Pt educated regarding status. Angiogram delayed at this time due to respiratory status. hospitalist paged to evaluate pt.

## 2018-07-02 NOTE — Progress Notes (Signed)
ANTICOAGULATION CONSULT NOTE  Pharmacy Consult for Heparin  Indication: arterial occlusion  Patient Measurements: Height: 5\' 7"  (170.2 cm) Weight: 152 lb (68.9 kg) IBW/kg (Calculated) : 66.1 Heparin Dosing Weight:  68.9 kg   Vital Signs: Temp: 97.6 F (36.4 C) (01/02 0544) Temp Source: Oral (01/02 0544) BP: 190/84 (01/02 0544) Pulse Rate: 81 (01/02 0544)  Labs: Recent Labs    06/30/18 0416  06/30/18 2305 07/01/18 0449 07/01/18 0719 07/02/18 0439  HGB 8.4*  --   --  9.3*  --  9.3*  HCT 28.1*  --   --  31.0*  --  31.3*  PLT 212  --   --  269  --  300  HEPARINUNFRC 0.29*   < > 0.43  --  0.48 0.36  CREATININE 1.14  --   --  1.06  --   --   CKTOTAL 1,152*  --   --  2,198*  --  1,178*   < > = values in this interval not displayed.    Estimated Creatinine Clearance: 45 mL/min (by C-G formula based on SCr of 1.06 mg/dL).   Medical History: Past Medical History:  Diagnosis Date  . Arthritis   . CKD (chronic kidney disease) stage 3, GFR 30-59 ml/min (HCC) 12/29/2015  . Dry gangrene (HCC) 06/16/2017   Great toe and 5th toe LEFT foot; Dr. Wyn Quaker  . Hyperlipidemia   . Hypertension   . Left leg DVT (HCC) 01/2014  . Myocardial infarction (HCC)   . Pulmonary embolism (HCC) 01/2014  . Thoracic aorta atherosclerosis (HCC) 10/08/2016    Medications:  Medications Prior to Admission  Medication Sig Dispense Refill Last Dose  . atenolol (TENORMIN) 50 MG tablet TAKE 1 TABLET (50 MG TOTAL) BY MOUTH DAILY. 90 tablet 1 06/27/2018 at 0800  . atorvastatin (LIPITOR) 10 MG tablet TAKE 1 TABLET BY MOUTH EVERYDAY AT BEDTIME 90 tablet 1 06/27/2018 at 2000  . Cholecalciferol (VITAMIN D-1000 MAX ST) 1000 units tablet Take 5 tablets by mouth daily.   06/27/2018 at 0800  . clopidogrel (PLAVIX) 75 MG tablet Take 1 tablet (75 mg total) by mouth daily. Use INSTEAD of Eliquis 30 tablet 0 06/28/2018 at 0800  . donepezil (ARICEPT) 5 MG tablet Take 1 tablet by mouth every evening.  6 06/27/2018 at 2000  .  loratadine (CLARITIN) 10 MG tablet Take 1 tablet (10 mg total) by mouth daily as needed for allergies.   06/27/2018 at 0800  . Omega-3 Fatty Acids (FISH OIL PO) Take 1 capsule by mouth 2 (two) times daily.   06/27/2018 at 0800  . triamcinolone lotion (KENALOG) 0.1 % Apply 1 application topically 3 (three) times daily. 120 mL 1 06/27/2018 at 0800  . vitamin B-12 (CYANOCOBALAMIN) 1000 MCG tablet Take 1 tablet by mouth daily.   06/27/2018 at 0800  . albuterol (PROVENTIL HFA;VENTOLIN HFA) 108 (90 Base) MCG/ACT inhaler Inhale 2 puffs into the lungs every 4 (four) hours as needed for wheezing or shortness of breath. (Patient not taking: Reported on 06/28/2018) 1 Inhaler 1 Not Taking at Unknown time    Assessment: Pharmacy consulted to dose heparin in this 85 year old male with occluded artery.   No prior anticoag noted.  Hgb still trending downward with PLT wnl;  will continue to monitor.  Heparin Course: 12/29 PM initiation: 4800 unit bolus, then 1200 units/hr 12/30 0100 HL 0.97: dec rate to 1100 units/hr 12/30 0956 HL 0.73: dec rate to 1000 units/hr 12/30 2035 HL 0.43 12/31 0416 HL 0.29:  increase rate to 1050 units/hr 12/31 1246 HL 0.20 12/31 2300 HL 0.43 01/01 0700 HL 0.48  Goal of Therapy:  Heparin level 0.3-0.7 units/ml Monitor platelets by anticoagulation protocol: Yes   Plan:  01/02 @ 0500 HL 0.36 therapeutic. Will continue current rate and will recheck w/ am labs.  Thomasene Ripple, PharmD, BCPS Clinical Pharmacist 07/02/2018

## 2018-07-02 NOTE — Consult Note (Signed)
  Amiodarone Drug - Drug Interaction Consult Note  Recommendations: There is an increased risk of QTc prolongation with loratadine and donepezil. Recommend considering risk vs benefit and possible discontinuation while on amiodarone.   Amiodarone is metabolized by the cytochrome P450 system and therefore has the potential to cause many drug interactions. Amiodarone has an average plasma half-life of 50 days (range 20 to 100 days).   There is potential for drug interactions to occur several weeks or months after stopping treatment and the onset of drug interactions may be slow after initiating amiodarone.   [x]  Statins: Increased risk of myopathy. Simvastatin- restrict dose to 20mg  daily. Other statins: counsel patients to report any muscle pain or weakness immediately.  [x]  Beta blockers: increased risk of bradycardia, AV block and myocardial depression. Sotalol - avoid concomitant use.  []   Calcium channel blockers (diltiazem and verapamil): increased risk of bradycardia, AV block and myocardial depression.  [x]  Diuretics: increased risk of cardiotoxicity if hypokalemia occurs.  []  Drugs that prolong the QT interval:  Torsades de pointes risk may be increased with concurrent use - avoid if possible.  Monitor QTc, also keep magnesium/potassium WNL if concurrent therapy can't be avoided. Marland Kitchen Antibiotics: e.g. fluoroquinolones, erythromycin. . Antiarrhythmics: e.g. quinidine, procainamide, disopyramide, sotalol. . Antipsychotics: e.g. phenothiazines, haloperidol.  . Lithium, tricyclic antidepressants, and methadone.  Thank You,  Gardner Candle, PharmD, BCPS Clinical Pharmacist 07/02/2018 1:18 PM

## 2018-07-02 NOTE — Progress Notes (Signed)
Special called to advise patient in respiratory distress and will be transferring to ICU. Unable to go forward with procedure today. Patient's belongings packed and taken to ICU.   Madie Reno, RN

## 2018-07-02 NOTE — Progress Notes (Signed)
[facility] [place] [facility]   PATIENT NAME: [name] [name]    MR#:  [id number]  DATE OF BIRTH:  January 22, 1928  SUBJECTIVE:  CHIEF COMPLAINT:   Chief Complaint  Patient presents with  . Fall   Patient was seen and examined area as he was extremely short of breath with a diffuse wheezing and went into atrial fibrillation with RVR.  No melena or blood in stool Angiogram was canceled    REVIEW OF SYSTEMS:    Review of Systems  Constitutional: Positive for malaise/fatigue. Negative for chills and fever.  HENT: Negative for sore throat.   Eyes: Negative for blurred vision, double vision and pain.  Respiratory: Positive for cough, shortness of breath and wheezing. Negative for hemoptysis.   Cardiovascular: Positive for palpitations. Negative for chest pain, orthopnea and leg swelling.  Gastrointestinal: Negative for abdominal pain, constipation, diarrhea, heartburn, nausea and vomiting.  Genitourinary: Negative for dysuria and hematuria.  Musculoskeletal: Negative for back pain and joint pain.  Skin: Negative for rash.  Neurological: Negative for sensory change, speech change, focal weakness and headaches.  Endo/Heme/Allergies: Does not bruise/bleed easily.  Psychiatric/Behavioral: Negative for depression. The patient is not nervous/anxious.    DRUG ALLERGIES:   Allergies  Allergen Reactions  . Aspirin Anaphylaxis, Swelling and Shortness Of Breath    REACTION: terrible bp reaction/wheezing  . Bee Pollen Other (See Comments)    ragweed  . Pollen Extract     ragweed  . Rabbit Epithelium     rabbits    VITALS:  Blood pressure (!) 155/65, pulse 95, temperature 99 F (37.2 C), temperature source Oral, resp. rate (!) 24, height 5\' 7"  (1.702 m), weight 68.8 kg, SpO2 98 %.  PHYSICAL EXAMINATION:   Physical Exam  GENERAL:  85 y.o.-year-old patient lying in the bed with no acute distress.  EYES: Pupils equal, round, reactive to light and accommodation. No  scleral icterus. Extraocular muscles intact.  HEENT: Head atraumatic, normocephalic. Oropharynx and nasopharynx clear.  NECK:  Supple, no jugular venous distention. No thyroid enlargement, no tenderness.  LUNGS:  Diffuse  wheezing,  rhonchi. Positive  use of accessory muscles of respiration.  CARDIOVASCULAR: Irregularly irregular no murmurs, rubs, or gallops.  ABDOMEN: Soft, nontender, nondistended. Bowel sounds present. EXTREMITIES: Left leg cold to touch with petechiae NEUROLOGIC: Awake alert and oriented x3 no focal Motor or sensory deficits b/l.   PSYCHIATRIC: The patient is alert and oriented x 3.  SKIN: No obvious rash, lesion, or ulcer.   LABORATORY PANEL:   CBC Recent Labs  Lab 07/02/18 0439  WBC 17.5*  HGB 9.3*  HCT 31.3*  PLT 300   ------------------------------------------------------------------------------------------------------------------ Chemistries  Recent Labs  Lab 06/28/18 1650  07/01/18 0449  NA 142   < > 137  K 4.0   < > 3.9  CL 109   < > 110  CO2 24   < > 21*  GLUCOSE 116*   < > 116*  BUN 31*   < > 28*  CREATININE 1.16   < > 1.06  CALCIUM 9.2   < > 8.2*  AST 50*  --   --   ALT 27  --   --   ALKPHOS 65  --   --   BILITOT 0.5  --   --    < > = values in this interval not displayed.   ------------------------------------------------------------------------------------------------------------------  Cardiac Enzymes No results for input(s): TROPONINI in the last 168 hours. ------------------------------------------------------------------------------------------------------------------  RADIOLOGY:  Dg Chest  2 View  Result Date: 06/30/2018 CLINICAL DATA:  Wheezing, hypoxia, former smoker. Previous MI. Patient was hospitalized 2 days ago due to a fall. EXAM: CHEST - 2 VIEW COMPARISON:  PA and lateral chest x-ray of June 14, 2017 FINDINGS: Today's study is obtained in a more lordotic fashion. The lungs are well-expanded. The interstitial markings  are coarse. There is thickening of the minor fissure. There is patchy increased density at the left lung base. Previously demonstrated density in the right upper lobe is less conspicuous today. The heart and pulmonary vascularity are normal. There is calcification in the wall of the aortic arch. IMPRESSION: Chronic interstitial changes bilaterally. Slightly increased interstitial density at the left lung base may reflect developing pneumonia. Thoracic aortic atherosclerosis. Electronically Signed   By: David  Swaziland M.D.   On: 06/30/2018 14:32     ASSESSMENT AND PLAN:   Patient is 85 year old with peripheral vascular disease with left lower extremity pain  *Acute hypoxic respiratory failure with atrial fibrillation with RVR Stat ABGs ordered BiPAP as needed and transfer patient to stepdown unit discussed with Dr. Lonn Georgia Bronchodilator treatments and Solu-Medrol  *Atrial fibrillation with RVR Patient did not respond to Cardizem IV bolus, started patient on Cardizem drip for better rate control Cardiology consult placed  * Pneumonia Patient complains of shortness of breath.  Chest x-ray showing infiltrate.  He does have elevated WBC.  WBC elevation could also be due to steroids he received on admission for wheezing. We will start Levaquin.  Not needing oxygen.  Nebulizers as needed.  *  left lower extremity ischemia Acute onset Evaluated by vascular surgery.  Heparin drip. Pain medications as needed He will need angiogram and has been scheduled for Thursday  * Acute rhabdomyolysis On IVF Monitor CK levels-[id number]  * Rash-possible vasculitis On steroids Sed rate is elevated  *  Anemia of chronic disease. Stable  *  Hypertension continue atenolol  *  Hyperlipidemia discontinue Lipitor in view of rhabdomyolysis  All the records are reviewed and case discussed with Care Management/Social Worker Management plans discussed with the patient, family and they are in  agreement.  CODE STATUS: FULL CODE  DVT Prophylaxis: SCDs  TOTAL CRITICAL CARE TIME TIME TAKING CARE OF THIS PATIENT: 35 minutes.   POSSIBLE D/C IN 2-3 DAYS, DEPENDING ON CLINICAL CONDITION.  Ramonita Lab M.D on 07/02/2018 at 2:13 PM  Between 7am to 6pm - Pager - [phone]  After 6pm go to [email or web address] - password [email or web address] [facility]  [facility]  Office  [phone]  CC: Primary care physician; Kerman Passey, MD  Note: This dictation was prepared with Dragon dictation along with smaller phrase technology. Any transcriptional errors that result from this process are unintentional.

## 2018-07-03 ENCOUNTER — Inpatient Hospital Stay: Payer: Self-pay

## 2018-07-03 DIAGNOSIS — R0902 Hypoxemia: Secondary | ICD-10-CM

## 2018-07-03 DIAGNOSIS — R0603 Acute respiratory distress: Secondary | ICD-10-CM

## 2018-07-03 DIAGNOSIS — I480 Paroxysmal atrial fibrillation: Secondary | ICD-10-CM

## 2018-07-03 LAB — CBC
HEMATOCRIT: 33.2 % — AB (ref 39.0–52.0)
Hemoglobin: 10.1 g/dL — ABNORMAL LOW (ref 13.0–17.0)
MCH: 23.4 pg — ABNORMAL LOW (ref 26.0–34.0)
MCHC: 30.4 g/dL (ref 30.0–36.0)
MCV: 76.9 fL — ABNORMAL LOW (ref 80.0–100.0)
Platelets: 343 10*3/uL (ref 150–400)
RBC: 4.32 MIL/uL (ref 4.22–5.81)
RDW: 15.8 % — AB (ref 11.5–15.5)
WBC: 20 10*3/uL — AB (ref 4.0–10.5)
nRBC: 0.2 % (ref 0.0–0.2)

## 2018-07-03 LAB — ANA COMPREHENSIVE PANEL
Anti JO-1: <0.2 AI (ref 0.0–0.9)
Centromere Ab Screen: <0.2 AI (ref 0.0–0.9)
Chromatin Ab SerPl-aCnc: <0.2 AI (ref 0.0–0.9)
ENA SM Ab Ser-aCnc: <0.2 AI (ref 0.0–0.9)
Ribonucleic Protein: <0.2 AI (ref 0.0–0.9)
SCLERODERMA (SCL-70) (ENA) ANTIBODY, IGG: 0.5 AI (ref 0.0–0.9)
SSA (Ro) (ENA) Antibody, IgG: >8 AI — ABNORMAL HIGH (ref 0.0–0.9)
SSB (La) (ENA) Antibody, IgG: <0.2 AI (ref 0.0–0.9)
ds DNA Ab: <1 IU/mL (ref 0–9)

## 2018-07-03 LAB — RHEUMATOID FACTOR: Rheumatoid fact SerPl-aCnc: 40.5 IU/mL — ABNORMAL HIGH (ref 0.0–13.9)

## 2018-07-03 LAB — BASIC METABOLIC PANEL
Anion gap: 15 (ref 5–15)
BUN: 30 mg/dL — ABNORMAL HIGH (ref 8–23)
CO2: 22 mmol/L (ref 22–32)
Calcium: 8.6 mg/dL — ABNORMAL LOW (ref 8.9–10.3)
Chloride: 103 mmol/L (ref 98–111)
Creatinine, Ser: 1.53 mg/dL — ABNORMAL HIGH (ref 0.61–1.24)
GFR calc Af Amer: 46 mL/min — ABNORMAL LOW (ref >60)
GFR calc non Af Amer: 40 mL/min — ABNORMAL LOW (ref >60)
Glucose, Bld: 194 mg/dL — ABNORMAL HIGH (ref 70–99)
Potassium: 2.8 mmol/L — ABNORMAL LOW (ref 3.5–5.1)
Sodium: 140 mmol/L (ref 135–145)

## 2018-07-03 LAB — HEPARIN LEVEL (UNFRACTIONATED): Heparin Unfractionated: 0.42 IU/mL (ref 0.30–0.70)

## 2018-07-03 LAB — COMPLEMENT, TOTAL: Compl, Total (CH50): >60 U/mL (ref 42–999999)

## 2018-07-03 LAB — CK: Total CK: 686 U/L — ABNORMAL HIGH (ref 49–397)

## 2018-07-03 LAB — POTASSIUM: Potassium: 3.1 mmol/L — ABNORMAL LOW (ref 3.5–5.1)

## 2018-07-03 MED ORDER — POTASSIUM CHLORIDE 10 MEQ/100ML IV SOLN
10.0000 meq | INTRAVENOUS | Status: DC
  Filled 2018-07-03 (×5): qty 100

## 2018-07-03 MED ORDER — VANCOMYCIN HCL IN DEXTROSE 750-5 MG/150ML-% IV SOLN
750.0000 mg | INTRAVENOUS | Status: DC
  Administered 2018-07-04 – 2018-07-06 (×3): 750 mg via INTRAVENOUS
  Filled 2018-07-03 (×3): qty 150

## 2018-07-03 MED ORDER — IPRATROPIUM-ALBUTEROL 0.5-2.5 (3) MG/3ML IN SOLN
3.0000 mL | RESPIRATORY_TRACT | Status: DC
  Administered 2018-07-03 – 2018-07-07 (×24): 3 mL via RESPIRATORY_TRACT
  Filled 2018-07-03 (×25): qty 3

## 2018-07-03 MED ORDER — SODIUM CHLORIDE 0.9% FLUSH
10.0000 mL | Freq: Two times a day (BID) | INTRAVENOUS | Status: DC
  Administered 2018-07-03: 10 mL
  Administered 2018-07-05 (×2): 30 mL
  Administered 2018-07-05 – 2018-07-10 (×9): 10 mL
  Administered 2018-07-10: 20 mL
  Administered 2018-07-11 (×2): 10 mL
  Administered 2018-07-12: 20 mL
  Administered 2018-07-12 – 2018-07-13 (×2): 10 mL

## 2018-07-03 MED ORDER — MORPHINE SULFATE (PF) 2 MG/ML IV SOLN
2.0000 mg | INTRAVENOUS | Status: DC | PRN
  Administered 2018-07-04 – 2018-07-10 (×7): 2 mg via INTRAVENOUS
  Filled 2018-07-03 (×5): qty 1

## 2018-07-03 MED ORDER — METHYLPREDNISOLONE SODIUM SUCC 125 MG IJ SOLR
60.0000 mg | Freq: Once | INTRAMUSCULAR | Status: AC
  Administered 2018-07-03: 60 mg via INTRAVENOUS
  Filled 2018-07-03: qty 2

## 2018-07-03 MED ORDER — ALBUTEROL SULFATE (2.5 MG/3ML) 0.083% IN NEBU
10.0000 mg/h | INHALATION_SOLUTION | RESPIRATORY_TRACT | Status: DC
  Administered 2018-07-03 – 2018-07-06 (×2): 10 mg/h via RESPIRATORY_TRACT
  Filled 2018-07-03: qty 12

## 2018-07-03 MED ORDER — BUDESONIDE 0.25 MG/2ML IN SUSP
0.2500 mg | Freq: Two times a day (BID) | RESPIRATORY_TRACT | Status: DC
  Administered 2018-07-03 (×2): 0.25 mg via RESPIRATORY_TRACT
  Filled 2018-07-03 (×2): qty 2

## 2018-07-03 MED ORDER — POTASSIUM CHLORIDE CRYS ER 20 MEQ PO TBCR
30.0000 meq | EXTENDED_RELEASE_TABLET | ORAL | Status: AC
  Administered 2018-07-03 (×2): 30 meq via ORAL
  Filled 2018-07-03 (×2): qty 2

## 2018-07-03 MED ORDER — SODIUM CHLORIDE 0.9% FLUSH: 10.0000 mL | INTRAVENOUS | Status: DC | PRN

## 2018-07-03 NOTE — Care Management Note (Signed)
Case Management Note  Patient Details  Name: [name] [name] MRN: [id number] Date of Birth: 05/28/1928  Subjective/Objective:    Patient admitted for peripheral vascular disease he was scheduled to go down for an angiogram 1/2 and found to be short of breath and in rapid a fib.  The procedure was canceled and the patient transferred to ICU on Bipap.  Patient is off Bipap and tolerating [place].  Patient still appears to be short of breath when talking and wheezing is audible just standing in the room, patient reports that this is his normal.  Wife, Karena Addison, is currently at the bedside visiting.  They live at [facility] apartments in [place] on the second floor, they take the elevators.  Both the patient and his wife require walkers but are independent in bathing and toileting, they receive meals on wheels during the week.  [name] daughter, [name] Lives in the same apartment complex and provides transportation for her parents when needed.   PCP is Dr. Sherie Don, pharmacy is [facility].  Plan is for patient to go for angiogram on Monday 1/6.  RNCM will cont to follow to assess for discharge needs. Robbie Lis RN BSN [phone] Action/Plan:   Expected Discharge Date:  06/30/18               Expected Discharge Plan:     In-House Referral:     Discharge planning Services     Post Acute Care Choice:    Choice offered to:     DME Arranged:    DME Agency:     HH Arranged:    HH Agency:     Status of Service:  In process, will continue to follow  If discussed at Long Length of Stay Meetings, dates discussed:    Additional Comments:  Allayne Butcher, RN 07/03/2018, 2:48 PM

## 2018-07-03 NOTE — Progress Notes (Signed)
Pt w/continued resp distress.  Stating, "I can't breathe."  Encouraged pt to let BiPap assist him with his breathing and pt agreed.  Bipap placed back on pt.  Discussed again w/pt that there are medications we can give to make the BiPap more tolerable for him. Pt declines at this time.

## 2018-07-03 NOTE — Progress Notes (Signed)
Peripherally Inserted Central Catheter/Midline Placement  The IV Nurse has discussed with the patient and/or persons authorized to consent for the patient, the purpose of this procedure and the potential benefits and risks involved with this procedure.  The benefits include less needle sticks, lab draws from the catheter, and the patient may be discharged home with the catheter. Risks include, but not limited to, infection, bleeding, blood clot (thrombus formation), and puncture of an artery; nerve damage and irregular heartbeat and possibility to perform a PICC exchange if needed/ordered by physician.  Alternatives to this procedure were also discussed.  Bard Power PICC patient education guide, fact sheet on infection prevention and patient information card has been provided to patient /or left at bedside.    PICC/Midline Placement Documentation  PICC Double Lumen 07/03/18 PICC Right Brachial 35 cm 0 cm (Active)  Indication for Insertion or Continuance of Line Vasoactive infusions 07/03/2018 12:47 PM  Exposed Catheter (cm) 0 cm 07/03/2018 12:47 PM  Site Assessment Clean;Dry;Intact 07/03/2018 12:47 PM  Lumen #1 Status Flushed;Blood return noted 07/03/2018 12:47 PM  Lumen #2 Status Flushed;Blood return noted 07/03/2018 12:47 PM  Dressing Type Transparent 07/03/2018 12:47 PM  Dressing Status Clean;Dry;Intact;Antimicrobial disc in place 07/03/2018 12:47 PM  Dressing Intervention New dressing 07/03/2018 12:47 PM  Dressing Change Due 07/10/18 07/03/2018 12:47 PM       Reginia FortsLumban, Auna Mikkelsen Albarece 07/03/2018, 12:50 PM

## 2018-07-03 NOTE — Progress Notes (Signed)
Follow up - Critical Care Medicine Note  Patient Details:    [name] [name] is an 85 y.o. male.with a past medical history remarkable for hypertension, hyperlipidemia, coronary artery disease, thromboembolic disease, severe peripheral arterial disease, chronic renal failure presented with left-sided leg pain beginning 2 days prior to presentation.  This was associated with claudication with eventual inability to ambulate.  Associated complaints of shortness of breath, wheezing.  Was going to have angiography upon arrival was noted to be in rapid atrial fibrillation with uncontrolled ventricular response along with severe dyspnea.  Patient is being transferred to the intensive care unit for further management.   Lines, Airways, Drains:    Anti-infectives:  Anti-infectives (From admission, onward)   Start     Dose/Rate Route Frequency Ordered Stop   07/04/18 0600  vancomycin (VANCOCIN) IVPB 750 mg/150 ml premix     750 mg 150 mL/hr over 60 Minutes Intravenous Every 24 hours 07/03/18 0813     07/02/18 2200  vancomycin (VANCOCIN) IVPB 1000 mg/200 mL premix  Status:  Discontinued     1,000 mg 200 mL/hr over 60 Minutes Intravenous Every 24 hours 07/02/18 1306 07/03/18 0813   07/02/18 1300  vancomycin (VANCOCIN) IVPB 1000 mg/200 mL premix     1,000 mg 200 mL/hr over 60 Minutes Intravenous  Once 07/02/18 1148 07/02/18 1436   07/02/18 1200  ceFEPIme (MAXIPIME) 2 g in sodium chloride 0.9 % 100 mL IVPB     2 g 200 mL/hr over 30 Minutes Intravenous Every 12 hours 07/02/18 1148     07/02/18 0837  ceFAZolin (ANCEF) IVPB 2g/100 mL premix  Status:  Discontinued     2 g 200 mL/hr over 30 Minutes Intravenous 30 min pre-op 07/02/18 0837 07/02/18 1114   07/01/18 1400  levofloxacin (LEVAQUIN) IVPB 750 mg  Status:  Discontinued     750 mg 100 mL/hr over 90 Minutes Intravenous Every 48 hours 07/01/18 1235 07/02/18 1148      Microbiology: Results for orders placed or performed during the hospital  encounter of 06/28/18  Blood culture (routine x 2)     Status: Abnormal   Collection Time: 06/28/18  4:35 PM  Result Value Ref Range Status   Specimen Description   Final    BLOOD BLOOD RIGHT WRIST Performed at [facility], [address] [place], [place] [address]    Special Requests   Final    BOTTLES DRAWN AEROBIC AND ANAEROBIC Blood Culture adequate volume Performed at [facility], [address] [place], [place] [address]    Culture  Setup Time (A)  Final    GRAM VARIABLE ROD AEROBIC BOTTLE ONLY CRITICAL RESULT CALLED TO, READ BACK BY AND VERIFIED WITHErskine Emery: [facility] AT 1415 06/29/18 SDR Performed at [facility], [address] [place], [place] [address]    Culture (A)  Final    BACILLUS SPECIES Standardized susceptibility testing for this organism is not available. Performed at [facility], [address] [place], [place] [address]    Report Status 07/01/2018 FINAL  Final  Blood culture (routine x 2)     Status: None (Preliminary result)   Collection Time: 06/28/18  4:51 PM  Result Value Ref Range Status   Specimen Description BLOOD RIGHT ANTECUBITAL  Final   Special Requests   Final    BOTTLES DRAWN AEROBIC AND ANAEROBIC Blood Culture adequate volume   Culture   Final    NO GROWTH 4 DAYS Performed at [facility],  [address], [place], [place] [address]    Report Status PENDING  Incomplete  MRSA PCR Screening     Status: None   Collection Time: 07/02/18 11:17 AM  Result Value Ref Range Status   MRSA by PCR NEGATIVE NEGATIVE Final    Comment:        The GeneXpert MRSA Assay (FDA approved for NASAL specimens only), is one component of a comprehensive MRSA colonization surveillance program. It is not intended to diagnose MRSA infection nor to guide or monitor treatment for MRSA infections. Performed at [facility], [address], [place], [place] [address]    Studies: Dg Chest 2 View  Result  Date: 06/30/2018 CLINICAL DATA:  Wheezing, hypoxia, former smoker. Previous MI. Patient was hospitalized 2 days ago due to a fall. EXAM: CHEST - 2 VIEW COMPARISON:  PA and lateral chest x-ray of June 14, 2017 FINDINGS: Today's study is obtained in a more lordotic fashion. The lungs are well-expanded. The interstitial markings are coarse. There is thickening of the minor fissure. There is patchy increased density at the left lung base. Previously demonstrated density in the right upper lobe is less conspicuous today. The heart and pulmonary vascularity are normal. There is calcification in the wall of the aortic arch. IMPRESSION: Chronic interstitial changes bilaterally. Slightly increased interstitial density at the left lung base may reflect developing pneumonia. Thoracic aortic atherosclerosis. Electronically Signed   By: David  Swaziland M.D.   On: 06/30/2018 14:32    Consults: Treatment Team:  Bertram Denver, MD Tora Kindred, DO Antonieta Iba, MD   Subjective:    Overnight Issues: Patient converted to sinus rhythm on amiodarone.  Weaning off of Cardizem.  Had recurrent episodes of bronchospasm required BiPAP, nebulizer, Solu-Medrol.  Presently resting comfortably  Objective:  Vital signs for last 24 hours: Temp:  [97.9 F (36.6 C)-99 F (37.2 C)] 97.9 F (36.6 C) (01/03 0400) Pulse Rate:  [71-146] 78 (01/03 0600) Resp:  [15-31] 17 (01/03 0600) BP: (112-165)/(49-128) 122/49 (01/03 0600) SpO2:  [82 %-100 %] 97 % (01/03 0600) FiO2 (%):  [24 %-30 %] 25 % (01/03 0356) Weight:  [68.8 kg] 68.8 kg (01/02 1125)  Hemodynamic parameters for last 24 hours:    Intake/Output from previous day: 01/02 0701 - 01/03 0700 In: 1564.4 [I.V.:1264.4; IV Piggyback:300] Out: 2060 [Urine:2060]  Intake/Output this shift: No intake/output data recorded.  Vent settings for last 24 hours: FiO2 (%):  [24 %-30 %] 25 %  Physical Exam:  HEENT:           Presently on BiPAP, somewhat improved from  yesterday.  Trachea midline, no thyromegaly noted Cardiovascular:        Presently in sinus rhythm, on amiodarone and weaning Cardizem Pulmonary:       Expiratory wheezing appreciated improved Abdominal:      Positive bowel sounds, soft exam Extremities:     Diffuse pain noted on his left leg, rash appreciated, please see picture in chart.  Unable to palpate pulses in his left lower extremity.  Right lower extremity there are some dorsalis pedis pulses.  Pending angiography Neurologic:      Patient moves all extremities, cranial nerves are grossly intact of any focal deficits appreciated Cutaneous:      Diffuse maculopapular rash appreciated in particular in his left lower extremity associated with scaling also noted in his right lower extremity.  Unclear etiology  Assessment/Plan:  Respiratory distress.  Significantly bronchospastic.  On Solu-Medrol, albuterol, Atrovent and BiPAP  Atrial fibrillation with rapid ventricular response.  Converted with amiodarone.  On 5 of Cardizem.  Cardiology following appreciate input  Peripheral arterial disease.  Patient continues to be on heparin, pending angiogram per vascular  Elevated CK.  Pending this morning's value.  Yesterday was 1178  Leukocytosis.  Patient is on cefepime and vancomycin  Anemia no evidence of active bleeding  Cutaneous rash.  So for markedly elevated rheumatoid factor at 40.5, complement greater than 60, CRP 18.6, sed rate elevated at 69 will follow remaining results  Hypokalemia.  Will replace  Renal insufficiency.  BUN is 30/creatinine 1.53.  Which has worsened from yesterday.   Critical care time 35 minutes  Benz Vandenberghe 07/03/2018  *Care during the described time interval was provided by me and/or other providers on the critical care team.  I have reviewed this patient's available data, including medical history, events of note, physical examination and test results as part of my evaluation.

## 2018-07-03 NOTE — Progress Notes (Signed)
Patient with slight increased WOB and SOB Patient REFUSED To be placed back on biPAP  I had a very frank discussion with patient He is alert and awake oriented x 4  He is aware that he has a hard time breathing and he has a weak heart I have explained to him that if his breathing gets worse, he does NOT want biPAP and he does NOT want to be put on Ventilator.  I explained that his heart can stop and he does NOT want CPR, chest compressions and shock therapy.   He is now a DNR/DNI.  I have updated and called Daughter [name] and she understands the circumstances and she agrees that we should follow her fathers wishes.  I have called Wife Karena Addison, but there was no answer.    Patient/Family are satisfied with Plan of action and management. All questions answered  Overall, prognosis is very poor.  Recommend palliative care and hospice services  Doyal Saric Santiago Glad, M.D.  [facility]  Medical Director [facility] Medical Director [facility] Cardio-Pulmonary Department

## 2018-07-03 NOTE — Progress Notes (Signed)
Patient remains on BiPap.  Patient confused at time.  Multiple attempts at taking BiPap off. Good urine output.  No acute distress noted. Will continue to monitor.  Report given to oncoming nurse.

## 2018-07-03 NOTE — Consult Note (Signed)
Pharmacy Antibiotic Note  [name] [name] is a 85 y.o. male admitted on 06/28/2018 with severe PAD, arterial occlusion and possible PNA.  Patient has leukocytosis and has been receiving levofloxacin.  Pharmacy consulted for vancomycin and cefepime dosing on 01/02.     01/02 Vancomycin 1000mg  IV every 24 hour started.   Plan: 01/03 Scr increased from 1.06 to 1.53. Will adjust vancomycin dose to Vancomycin 750mg  IV every 24 hours. Dose has been pushed out 6 hours. Goal trough 10-15 mcg/mL. Calculated trough @ Css is 14.5. Trough level prior to 4th dose. Pharmacy will monitor renal function and adjust dose as needed.   Continue Cefepime 2g IV every 12 hours.   New Kinetics:   Ke:0.030    Vd: 48    T1/2: 23  Height: 5\' 7"  (170.2 cm) Weight: 151 lb 10.8 oz (68.8 kg) IBW/kg (Calculated) : 66.1  Temp (24hrs), Avg:98.5 F (36.9 C), Min:97.9 F (36.6 C), Max:99 F (37.2 C)  Recent Labs  Lab 06/28/18 1650 06/28/18 1659 06/29/18 0100 06/29/18 0502 06/30/18 0416 07/01/18 0449 07/02/18 0439 07/03/18 0256  WBC 13.6*  --  14.6*  --  14.5* 19.2* 17.5* 20.0*  CREATININE 1.16  --  1.09  --  1.14 1.06  --  1.53*  LATICACIDVEN  --  2.16*  --  1.2  --   --   --   --     Estimated Creatinine Clearance: 31.2 mL/min (A) (by C-G formula based on SCr of 1.53 mg/dL (H)).    Allergies  Allergen Reactions  . Aspirin Anaphylaxis, Swelling and Shortness Of Breath    REACTION: terrible bp reaction/wheezing  . Bee Pollen Other (See Comments)    ragweed  . Pollen Extract     ragweed  . Rabbit Epithelium     rabbits    Antimicrobials this admission: 1/1 levofloxacin >> 1/2 1/2 vancomycin >>   1/2 cefepime>>   Dose adjustments this admission: 1/3 Vanc dose changed from 1g q24h to 750mg  q24h due to chage in renal function   Microbiology results: 12/29 BCx: gram variable rods, Bacillus spp 01/02 MRSA PCR: negative   Thank you for allowing pharmacy to be a part of this patient's  care.  Gardner Candle, PharmD, BCPS Clinical Pharmacist 07/03/2018 8:13 AM

## 2018-07-03 NOTE — Progress Notes (Signed)
ANTICOAGULATION CONSULT NOTE  Pharmacy Consult for Heparin  Indication: arterial occlusion  Patient Measurements: Height: 5\' 7"  (170.2 cm) Weight: 151 lb 10.8 oz (68.8 kg) IBW/kg (Calculated) : 66.1 Heparin Dosing Weight:  68.9 kg   Vital Signs: Temp: 97.9 F (36.6 C) (01/03 0400) Temp Source: Axillary (01/03 0400) BP: 145/85 (01/03 0400) Pulse Rate: 84 (01/03 0400)  Labs: Recent Labs    07/01/18 0449 07/01/18 0719 07/02/18 0439 07/03/18 0256  HGB 9.3*  --  9.3* 10.1*  HCT 31.0*  --  31.3* 33.2*  PLT 269  --  300 343  HEPARINUNFRC  --  0.48 0.36 0.42  CREATININE 1.06  --   --  1.53*  CKTOTAL 2,198*  --  1,178*  --     Estimated Creatinine Clearance: 31.2 mL/min (A) (by C-G formula based on SCr of 1.53 mg/dL (H)).   Medical History: Past Medical History:  Diagnosis Date  . Arthritis   . CKD (chronic kidney disease) stage 3, GFR 30-59 ml/min (HCC) 12/29/2015  . Dry gangrene (HCC) 06/16/2017   Great toe and 5th toe LEFT foot; Dr. Wyn Quakerew  . Hyperlipidemia   . Hypertension   . Left leg DVT (HCC) 01/2014  . Myocardial infarction (HCC)   . Pulmonary embolism (HCC) 01/2014  . Thoracic aorta atherosclerosis (HCC) 10/08/2016    Medications:  Medications Prior to Admission  Medication Sig Dispense Refill Last Dose  . atenolol (TENORMIN) 50 MG tablet TAKE 1 TABLET (50 MG TOTAL) BY MOUTH DAILY. 90 tablet 1 06/27/2018 at 0800  . atorvastatin (LIPITOR) 10 MG tablet TAKE 1 TABLET BY MOUTH EVERYDAY AT BEDTIME 90 tablet 1 06/27/2018 at 2000  . Cholecalciferol (VITAMIN D-1000 MAX ST) 1000 units tablet Take 5 tablets by mouth daily.   06/27/2018 at 0800  . clopidogrel (PLAVIX) 75 MG tablet Take 1 tablet (75 mg total) by mouth daily. Use INSTEAD of Eliquis 30 tablet 0 06/28/2018 at 0800  . donepezil (ARICEPT) 5 MG tablet Take 1 tablet by mouth every evening.  6 06/27/2018 at 2000  . loratadine (CLARITIN) 10 MG tablet Take 1 tablet (10 mg total) by mouth daily as needed for allergies.    06/27/2018 at 0800  . Omega-3 Fatty Acids (FISH OIL PO) Take 1 capsule by mouth 2 (two) times daily.   06/27/2018 at 0800  . triamcinolone lotion (KENALOG) 0.1 % Apply 1 application topically 3 (three) times daily. 120 mL 1 06/27/2018 at 0800  . vitamin B-12 (CYANOCOBALAMIN) 1000 MCG tablet Take 1 tablet by mouth daily.   06/27/2018 at 0800  . albuterol (PROVENTIL HFA;VENTOLIN HFA) 108 (90 Base) MCG/ACT inhaler Inhale 2 puffs into the lungs every 4 (four) hours as needed for wheezing or shortness of breath. (Patient not taking: Reported on 06/28/2018) 1 Inhaler 1 Not Taking at Unknown time    Assessment: Pharmacy consulted to dose heparin in this 85 year old male with occluded artery.   No prior anticoag noted.  Hgb still trending downward with PLT wnl;  will continue to monitor.  Heparin Course: 12/29 PM initiation: 4800 unit bolus, then 1200 units/hr 12/30 0100 HL 0.97: dec rate to 1100 units/hr 12/30 0956 HL 0.73: dec rate to 1000 units/hr 12/30 2035 HL 0.43 12/31 0416 HL 0.29: increase rate to 1050 units/hr 12/31 1246 HL 0.20 12/31 2300 HL 0.43 01/01 0700 HL 0.48  Goal of Therapy:  Heparin level 0.3-0.7 units/ml Monitor platelets by anticoagulation protocol: Yes   Plan:  01/03 @ 0300 HL 0.42 therapeutic. Will  continue current rate and will recheck w/ am labs.  Thomasene Ripple, PharmD, BCPS Clinical Pharmacist 07/03/2018

## 2018-07-03 NOTE — Progress Notes (Signed)
[facility]  Daily Progress Note   Subjective  - * Surgery Date in Future *  Patient now in ICU.  HR is controlled on Amio and Cardizen On Bipap with better oxygenation Foot feels about the same  Objective Vitals:   07/03/18 0124 07/03/18 0400 07/03/18 0500 07/03/18 0600  BP:  (!) 145/85 (!) 127/49 (!) 122/49  Pulse: 80 84 90 78  Resp: (!) 21 (!) 25 15 17   Temp:  97.9 F (36.6 C)    TempSrc:  Axillary    SpO2: 95% 91% 97% 97%  Weight:      Height:        Intake/Output Summary (Last 24 hours) at 07/03/2018 0826 Last data filed at 07/03/2018 0400 Gross per 24 hour  Intake 1564.35 ml  Output 2060 ml  Net -495.65 ml    PULM  Coarse, on BiPap CV  RRR VASC  Foot warm, left toes a somewhat dusky  Laboratory CBC    Component Value Date/Time   WBC 20.0 (H) 07/03/2018 0256   HGB 10.1 (L) 07/03/2018 0256   HGB 11.5 (L) 03/13/2015 0906   HCT 33.2 (L) 07/03/2018 0256   HCT 36.5 (L) 03/13/2015 0906   PLT 343 07/03/2018 0256   PLT 241 03/13/2015 0906    BMET    Component Value Date/Time   NA 140 07/03/2018 0256   NA 141 03/13/2015 0906   NA 137 02/07/2014 0402   K 2.8 (L) 07/03/2018 0256   K 4.3 02/07/2014 0402   CL 103 07/03/2018 0256   CL 103 02/07/2014 0402   CO2 22 07/03/2018 0256   CO2 22 02/07/2014 0402   GLUCOSE 194 (H) 07/03/2018 0256   GLUCOSE 104 (H) 02/06/2014 0620   BUN 30 (H) 07/03/2018 0256   BUN 27 03/13/2015 0906   BUN 20 (H) 02/06/2014 0620   CREATININE 1.53 (H) 07/03/2018 0256   CREATININE 1.57 (H) 06/18/2018 1547   CALCIUM 8.6 (L) 07/03/2018 0256   CALCIUM 8.1 (L) 02/06/2014 0620   GFRNONAA 40 (L) 07/03/2018 0256   GFRNONAA 39 (L) 06/18/2018 1547   GFRAA 46 (L) 07/03/2018 0256   GFRAA 45 (L) 06/18/2018 1547    Assessment/Planning:    Toes are somewhat cyanotic but the foot is reasonably warm  Will plan for angio Monday if medically stable.  Unable to do yesterday  Multiple ongoing issues in a very complicated  elderly patient at high risk of limb loss    Festus Barren  07/03/2018, 8:26 AM

## 2018-07-03 NOTE — Progress Notes (Signed)
[facility] [place] [facility]   PATIENT NAME: Wallie Guy    MR#:  [id number]  DATE OF BIRTH:  12-17-1927  SUBJECTIVE:  CHIEF COMPLAINT:   Chief Complaint  Patient presents with  . Fall   Patient is converted back to sinus rhythm.  Feeling better but still short of breath with minimal exertion.  Off Cardizem drip Angiogram rescheduled for Monday    REVIEW OF SYSTEMS:    Review of Systems  Constitutional: Positive for malaise/fatigue. Negative for chills and fever.  HENT: Negative for sore throat.   Eyes: Negative for blurred vision, double vision and pain.  Respiratory: Positive for cough, shortness of breath and wheezing. Negative for hemoptysis.   Cardiovascular: Negative for chest pain, palpitations, orthopnea and leg swelling.  Gastrointestinal: Negative for abdominal pain, constipation, diarrhea, heartburn, nausea and vomiting.  Genitourinary: Negative for dysuria and hematuria.  Musculoskeletal: Negative for back pain and joint pain.  Skin: Negative for rash.  Neurological: Negative for sensory change, speech change, focal weakness and headaches.  Endo/Heme/Allergies: Does not bruise/bleed easily.  Psychiatric/Behavioral: Negative for depression. The patient is not nervous/anxious.    DRUG ALLERGIES:   Allergies  Allergen Reactions  . Aspirin Anaphylaxis, Swelling and Shortness Of Breath    REACTION: terrible bp reaction/wheezing  . Bee Pollen Other (See Comments)    ragweed  . Pollen Extract     ragweed  . Rabbit Epithelium     rabbits    VITALS:  Blood pressure (!) 131/55, pulse 78, temperature 97.9 F (36.6 C), temperature source Axillary, resp. rate (!) 23, height 5\' 7"  (1.702 m), weight 68.8 kg, SpO2 94 %.  PHYSICAL EXAMINATION:   Physical Exam  GENERAL:  85 y.o.-year-old patient lying in the bed with no acute distress.  EYES: Pupils equal, round, reactive to light and accommodation. No scleral icterus. Extraocular muscles  intact.  HEENT: Head atraumatic, normocephalic. Oropharynx and nasopharynx clear.  NECK:  Supple, no jugular venous distention. No thyroid enlargement, no tenderness.  LUNGS: Minimal diffuse wheezing, no rhonchi. No  use of accessory muscles of respiration.  CARDIOVASCULAR: Regular rate and rhythm no murmurs, rubs, or gallops.  ABDOMEN: Soft, nontender, nondistended. Bowel sounds present. EXTREMITIES: Left leg cold to touch with petechiae NEUROLOGIC: Awake alert and oriented x3 no focal Motor or sensory deficits b/l.   PSYCHIATRIC: The patient is alert and oriented x 3.  SKIN: No obvious rash, lesion, or ulcer.   LABORATORY PANEL:   CBC Recent Labs  Lab 07/03/18 0256  WBC 20.0*  HGB 10.1*  HCT 33.2*  PLT 343   ------------------------------------------------------------------------------------------------------------------ Chemistries  Recent Labs  Lab 06/28/18 1650  07/03/18 0256 07/03/18 1318  NA 142   < > 140  --   K 4.0   < > 2.8* 3.1*  CL 109   < > 103  --   CO2 24   < > 22  --   GLUCOSE 116*   < > 194*  --   BUN 31*   < > 30*  --   CREATININE 1.16   < > 1.53*  --   CALCIUM 9.2   < > 8.6*  --   AST 50*  --   --   --   ALT 27  --   --   --   ALKPHOS 65  --   --   --   BILITOT 0.5  --   --   --    < > = values  in this interval not displayed.   ------------------------------------------------------------------------------------------------------------------  Cardiac Enzymes No results for input(s): TROPONINI in the last 168 hours. ------------------------------------------------------------------------------------------------------------------  RADIOLOGY:  [place] Ekg Site Rite  Result Date: 07/03/2018 If Site Rite image not attached, placement could not be confirmed due to current cardiac rhythm.    ASSESSMENT AND PLAN:   Patient is 85 year old with peripheral vascular disease with left lower extremity pain  *Acute hypoxic respiratory failure with atrial  fibrillation with RVR Off bipap Bronchodilator treatments and Solu-Medrol  *Atrial fibrillation with RVR Cardizem drip and patient is on amiodarone Cardiology consult placed, seen by Dr. Mariah MillingGollan  *Acute kidney injury Lasix discontinued monitor renal function closely avoid nephrotoxins  * Pneumonia Cefepime and vancomycin  *  left lower extremity ischemia Acute onset, continue heparin drip , seen by vascular Pain medications as needed He will need angiogram and has been scheduled for Monday  * Acute rhabdomyolysis On IVF, improving clinically Monitor CK levels-[id number]  * Rash-possible vasculitis On steroids Sed rate is elevated 69 rheumatoid factor is elevated at 40.4, CRP 18.6  *  Anemia of chronic disease. Stable  *  Hypertension continue atenolol  *  Hyperlipidemia discontinue Lipitor in view of rhabdomyolysis  All the records are reviewed and case discussed with Care Management/Social Worker Management plans discussed with the patient, family and they are in agreement.  CODE STATUS: FULL CODE  DVT Prophylaxis: SCDs  TOTAL CRITICAL CARE TIME TIME TAKING CARE OF THIS PATIENT: 35 minutes.   POSSIBLE D/C IN 2-3 DAYS, DEPENDING ON CLINICAL CONDITION.  Ramonita LabAruna Chavela Justiniano M.D on 07/03/2018 at 2:13 PM  Between 7am to 6pm - Pager - [phone]  After 6pm go to [email or web address] - password [email or web address]  [facility] [facility]  Office  [phone]  CC: Primary care physician; Kerman PasseyLada, Melinda P, MD  Note: This dictation was prepared with Dragon dictation along with smaller phrase technology. Any transcriptional errors that result from this process are unintentional.

## 2018-07-03 NOTE — Progress Notes (Signed)
Progress Note  Patient Name: [name] [name] Date of Encounter: 07/03/2018  Primary Cardiologist: Yvonne Kendall, MD , [facility]  Subjective   No complaints, reports his breathing is improving Still on BiPAP this morning Telemetry reviewed, converted to normal sinus rhythm rate now in the 70-80 range Still on amiodarone infusion and diltiazem infusion Wheezing  Inpatient Medications    Scheduled Meds: . atenolol  50 mg Oral Daily  . budesonide (PULMICORT) nebulizer solution  0.25 mg Nebulization BID  . cholecalciferol  5,000 Units Oral Daily  . clopidogrel  75 mg Oral Daily  . donepezil  5 mg Oral QPM  . ipratropium-albuterol  3 mL Nebulization Q4H  . methylPREDNISolone (SOLU-MEDROL) injection  60 mg Intravenous Q6H  . omega-3 acid ethyl esters  1 g Oral Daily  . potassium chloride  30 mEq Oral Q4H  . senna-docusate  1 tablet Oral BID  . sodium chloride flush  10-40 mL Intracatheter Q12H  . sodium chloride flush  3 mL Intravenous Q12H  . vitamin B-12  1,000 mcg Oral Daily   Continuous Infusions: . sodium chloride    . sodium chloride 75 mL/hr at 07/03/18 1251  . albuterol 10 mg/hr (07/03/18 0219)  . amiodarone 30 mg/hr (07/03/18 1326)  . ceFEPime (MAXIPIME) IV 2 g (07/02/18 2242)  . diltiazem (CARDIZEM) infusion 2.5 mg/hr (07/03/18 0720)  . heparin 1,200 Units/hr (07/03/18 1251)  . [START ON 07/04/2018] vancomycin     PRN Meds: sodium chloride, acetaminophen **OR** acetaminophen, levalbuterol, loratadine, ondansetron **OR** ondansetron (ZOFRAN) IV, oxyCODONE, sodium chloride flush, sodium chloride flush   Vital Signs    Vitals:   07/03/18 0700 07/03/18 0800 07/03/18 1055 07/03/18 1128  BP: (!) 149/52 (!) 158/52 (!) 131/55   Pulse: (!) 43 93 78   Resp: (!) 22 (!) 23    Temp:      TempSrc:      SpO2: 97% 97%  94%  Weight:      Height:        Intake/Output Summary (Last 24 hours) at 07/03/2018 1344 Last data filed at 07/03/2018 0400 Gross per 24 hour  Intake 1393  ml  Output 1110 ml  Net 283 ml   Filed Weights   06/28/18 1553 07/02/18 1125  Weight: 68.9 kg 68.8 kg    Telemetry    Normal sinus rhythm- Personally Reviewed  ECG     - Personally Reviewed  Physical Exam   GEN: No acute distress.  BiPAP in place Neck: No JVD Cardiac: RRR, no murmurs, rubs, or gallops.  Respiratory:  Diffuse wheezing bilaterally GI: Soft, nontender, non-distended  MS: No edema; No deformity. Neuro:  Nonfocal  Psych: Normal affect   Labs    Chemistry Recent Labs  Lab 06/28/18 1650  06/30/18 0416 07/01/18 0449 07/03/18 0256  NA 142   < > 139 137 140  K 4.0   < > 4.0 3.9 2.8*  CL 109   < > 110 110 103  CO2 24   < > 21* 21* 22  GLUCOSE 116*   < > 124* 116* 194*  BUN 31*   < > 29* 28* 30*  CREATININE 1.16   < > 1.14 1.06 1.53*  CALCIUM 9.2   < > 8.3* 8.2* 8.6*  PROT 7.1  --   --   --   --   ALBUMIN 3.7  --   --   --   --   AST 50*  --   --   --   --  ALT 27  --   --   --   --   ALKPHOS 65  --   --   --   --   BILITOT 0.5  --   --   --   --   GFRNONAA 56*   < > 57* >60 40*  GFRAA >60   < > >60 >60 46*  ANIONGAP 9   < > 8 6 15    < > = values in this interval not displayed.     Hematology Recent Labs  Lab 07/01/18 0449 07/02/18 0439 07/03/18 0256  WBC 19.2* 17.5* 20.0*  RBC 3.93* 3.98* 4.32  HGB 9.3* 9.3* 10.1*  HCT 31.0* 31.3* 33.2*  MCV 78.9* 78.6* 76.9*  MCH 23.7* 23.4* 23.4*  MCHC 30.0 29.7* 30.4  RDW 15.7* 15.8* 15.8*  PLT 269 300 343    Cardiac EnzymesNo results for input(s): TROPONINI in the last 168 hours. No results for input(s): TROPIPOC in the last 168 hours.   BNPNo results for input(s): BNP, PROBNP in the last 168 hours.   DDimer No results for input(s): DDIMER in the last 168 hours.   Radiology    [place] Ekg Site Rite  Result Date: 07/03/2018 If [facility] not [facility], placement could not be confirmed due to current cardiac rhythm.   Cardiac Studies     Patient Profile     [name] [name] is an  85 year old gentleman with history of paroxysmal atrial fibrillation, coronary artery disease occlusion of the LAD, prior STEMI PCI to the distal RCA, aortic atherosclerosis, stage III renal failure, PAD, s/p SFA/popliteal stent, history of DVT and PE, prior smoker who presented today for lower extremity angiogram with Dr. dew found to have acute respiratory distress, atrial fibrillation with RVR and procedure was canceled  Assessment & Plan    1) paroxysmal atrial fibrillation with RVR  acute respiratory distress/wheezing, pulmonary disease/bronchitis  Likely contributing to his arrhythmia with RVR Atrial fibrillation seem to start 2 nights ago early morning 4-5 AM on review of telemetry strips in the computer and telemetry, and vitals -Converting back to normal sinus rhythm in less than 24 hours on amiodarone and diltiazem infusion -He is on heparin infusion. ,CHA2DS2VASc score of at least4 (HTN, agex2, vascular). Would consider continuing both low-dose diltiazem and amiodarone until he is off BiPAP, swallowing pills at which point infusions could be changed to oral pill -High risk of recurrent arrhythmia in the setting of underlying lung disease and wheezing/respiratory distress  2) acute respiratory distress  being treated with broad-spectrum antibiotics Currently on BiPAP -Severe wheezing on today's exam Would hold the Lasix in the setting of worsening renal dysfunction and need for angiography lower extremities in the near future  3) CAD with stable angina High risk of ischemia given known disease, aortic atherosclerosis, vasculopath  also with PAD lower extremities Prior echocardiogram March 2019 with normal ejection fraction 55 to 60% No further ischemic work-up at this time  4) PAD, leg claudication Will need to work on respiratory status through the weekend in preparation for lower extremity angiography potentially next Monday -We will hold Lasix to minimize bump in  creatinine in preparation for contrast   Total encounter time more than 25 minutes  Greater than 50% was spent in counseling and coordination of care with the patient   For questions or updates, please contact [facility] Please consult [email or web address] for contact info under        Signed, Julien Nordmannimothy , MD  07/03/2018, 1:44  PM

## 2018-07-03 NOTE — Progress Notes (Signed)
Pt with audible WH and SOB when resting in bed. Rhonchorous, non-productive cough.  Refusing to put the BiPap back on.  Declines offer to call MD to get order for medication to help rest w/ BiPap on. Discussed w/pt my concern that he is working so hard to breathe and if he doesn't put the BiPap on to help him he'll end up having to go on a ventilator.  Pt states, "I don't want to be on a ventilator."  I encouraged pt to have a prn med neb.  Pt refuses this as well stating, "It makes it too hard for me to sleep at night."  Dr Belia HemanKasa is on his way in to the unit, I will discuss with him any further options for this patient.

## 2018-07-04 DIAGNOSIS — I2510 Atherosclerotic heart disease of native coronary artery without angina pectoris: Secondary | ICD-10-CM

## 2018-07-04 LAB — CULTURE, BLOOD (ROUTINE X 2)
Culture: NO GROWTH
SPECIAL REQUESTS: ADEQUATE

## 2018-07-04 LAB — BASIC METABOLIC PANEL
Anion gap: 7 (ref 5–15)
BUN: 39 mg/dL — ABNORMAL HIGH (ref 8–23)
CO2: 22 mmol/L (ref 22–32)
Calcium: 8.2 mg/dL — ABNORMAL LOW (ref 8.9–10.3)
Chloride: 109 mmol/L (ref 98–111)
Creatinine, Ser: 1.53 mg/dL — ABNORMAL HIGH (ref 0.61–1.24)
GFR calc Af Amer: 46 mL/min — ABNORMAL LOW (ref >60)
GFR calc non Af Amer: 40 mL/min — ABNORMAL LOW (ref >60)
GLUCOSE: 151 mg/dL — AB (ref 70–99)
Potassium: 3.9 mmol/L (ref 3.5–5.1)
Sodium: 138 mmol/L (ref 135–145)

## 2018-07-04 LAB — HEPARIN LEVEL (UNFRACTIONATED)
HEPARIN UNFRACTIONATED: 0.69 [IU]/mL (ref 0.30–0.70)
Heparin Unfractionated: 0.72 IU/mL — ABNORMAL HIGH (ref 0.30–0.70)
Heparin Unfractionated: 0.65 IU/mL (ref 0.30–0.70)

## 2018-07-04 LAB — CBC
HCT: 29.2 % — ABNORMAL LOW (ref 39.0–52.0)
Hemoglobin: 9 g/dL — ABNORMAL LOW (ref 13.0–17.0)
MCH: 23.4 pg — ABNORMAL LOW (ref 26.0–34.0)
MCHC: 30.8 g/dL (ref 30.0–36.0)
MCV: 76 fL — AB (ref 80.0–100.0)
PLATELETS: 309 10*3/uL (ref 150–400)
RBC: 3.84 MIL/uL — ABNORMAL LOW (ref 4.22–5.81)
RDW: 16 % — ABNORMAL HIGH (ref 11.5–15.5)
WBC: 19.3 10*3/uL — AB (ref 4.0–10.5)
nRBC: 0.2 % (ref 0.0–0.2)

## 2018-07-04 LAB — T4, FREE: Free T4: 1.69 ng/dL (ref 0.82–1.77)

## 2018-07-04 MED ORDER — FUROSEMIDE 10 MG/ML IJ SOLN
INTRAMUSCULAR | Status: AC
  Administered 2018-07-04: 40 mg
  Filled 2018-07-04: qty 4

## 2018-07-04 MED ORDER — BUDESONIDE 0.5 MG/2ML IN SUSP
0.5000 mg | Freq: Two times a day (BID) | RESPIRATORY_TRACT | Status: DC
  Administered 2018-07-04 – 2018-07-10 (×13): 0.5 mg via RESPIRATORY_TRACT
  Filled 2018-07-04 (×13): qty 2

## 2018-07-04 MED ORDER — FUROSEMIDE 10 MG/ML IJ SOLN: 40.0000 mg | Freq: Once | INTRAMUSCULAR | Status: DC

## 2018-07-04 MED ORDER — HYDRALAZINE HCL 20 MG/ML IJ SOLN
10.0000 mg | Freq: Four times a day (QID) | INTRAMUSCULAR | Status: DC | PRN
  Administered 2018-07-04: 10 mg via INTRAVENOUS
  Filled 2018-07-04: qty 1

## 2018-07-04 MED ORDER — GUAIFENESIN 100 MG/5ML PO SOLN
5.0000 mL | ORAL | Status: DC | PRN
  Administered 2018-07-04 – 2018-07-12 (×2): 100 mg via ORAL
  Filled 2018-07-04 (×4): qty 5

## 2018-07-04 NOTE — Progress Notes (Signed)
Subjective: Interval History: has complaints ongoing significant shortness of breath...  Significant developments overnight with patient now DNR/DNI.  Objective: Vital signs in last 24 hours: Temp:  [97.8 F (36.6 C)-98.7 F (37.1 C)] 97.8 F (36.6 C) (01/04 0800) Pulse Rate:  [57-78] 67 (01/04 0800) Resp:  [14-24] 15 (01/04 0800) BP: (131-178)/(55-83) 146/62 (01/04 0800) SpO2:  [93 %-100 %] 95 % (01/04 0800) FiO2 (%):  [30 %] 30 % (01/04 0404)  Intake/Output from previous day: 01/03 0701 - 01/04 0700 In: 4161.6 [P.O.:480; I.V.:2581.5; IV Piggyback:100.1] Out: 475 [Urine:475] Intake/Output this shift: No intake/output data recorded.  General appearance: alert, fatigued and mild distress Resp: wheezes Throughout and Accessory muscle use  Lab Results: Recent Labs    07/03/18 0256 07/04/18 0511  WBC 20.0* 19.3*  HGB 10.1* 9.0*  HCT 33.2* 29.2*  PLT 343 309   BMET Recent Labs    07/03/18 0256 07/03/18 1318 07/04/18 0511  NA 140  --  138  K 2.8* 3.1* 3.9  CL 103  --  109  CO2 22  --  22  GLUCOSE 194*  --  151*  BUN 30*  --  39*  CREATININE 1.53*  --  1.53*  CALCIUM 8.6*  --  8.2*    Studies/Results: Dg Chest 2 View  Result Date: 06/30/2018 CLINICAL DATA:  Wheezing, hypoxia, former smoker. Previous MI. Patient was hospitalized 2 days ago due to a fall. EXAM: CHEST - 2 VIEW COMPARISON:  PA and lateral chest x-ray of June 14, 2017 FINDINGS: Today's study is obtained in a more lordotic fashion. The lungs are well-expanded. The interstitial markings are coarse. There is thickening of the minor fissure. There is patchy increased density at the left lung base. Previously demonstrated density in the right upper lobe is less conspicuous today. The heart and pulmonary vascularity are normal. There is calcification in the wall of the aortic arch. IMPRESSION: Chronic interstitial changes bilaterally. Slightly increased interstitial density at the left lung base may reflect  developing pneumonia. Thoracic aortic atherosclerosis. Electronically Signed   By: David  [place] M.D.   On: 06/30/2018 14:32   [place] Ekg Site Rite  Result Date: 07/03/2018 If Site Rite image not attached, placement could not be confirmed due to current cardiac rhythm.  Anti-infectives: Anti-infectives (From admission, onward)   Start     Dose/Rate Route Frequency Ordered Stop   07/04/18 0600  vancomycin (VANCOCIN) IVPB 750 mg/150 ml premix     750 mg 150 mL/hr over 60 Minutes Intravenous Every 24 hours 07/03/18 0813     07/02/18 2200  vancomycin (VANCOCIN) IVPB 1000 mg/200 mL premix  Status:  Discontinued     1,000 mg 200 mL/hr over 60 Minutes Intravenous Every 24 hours 07/02/18 1306 07/03/18 0813   07/02/18 1300  vancomycin (VANCOCIN) IVPB 1000 mg/200 mL premix     1,000 mg 200 mL/hr over 60 Minutes Intravenous  Once 07/02/18 1148 07/02/18 1436   07/02/18 1200  ceFEPIme (MAXIPIME) 2 g in sodium chloride 0.9 % 100 mL IVPB     2 g 200 mL/hr over 30 Minutes Intravenous Every 12 hours 07/02/18 1148     07/02/18 0837  ceFAZolin (ANCEF) IVPB 2g/100 mL premix  Status:  Discontinued     2 g 200 mL/hr over 30 Minutes Intravenous 30 min pre-op 07/02/18 0837 07/02/18 1114   07/01/18 1400  levofloxacin (LEVAQUIN) IVPB 750 mg  Status:  Discontinued     750 mg 100 mL/hr over 90 Minutes Intravenous Every 48  hours 07/01/18 1235 07/02/18 1148      Assessment/Plan: s/p Procedure(s): Lower Extremity Angiography, possible intervention (Left) Plan had been for angiography Monday.  This may well be subject to change, as he likely will have difficulty laying flat, and holding still for the procedure.  It may be that palliative care is the most appropriate thing for this gentleman at this point given his multiple medical comorbidities.  No decision needs to be made today about whether or not to proceed with angiography Monday, Dr. Wyn Quaker will be back then, will make that decision on Monday depending on how he  responds to clinical intervention in the meantime.   LOS: 6 days   Avien Taha A Rowynn Mcweeney 07/04/2018, 9:01 AM

## 2018-07-04 NOTE — Progress Notes (Signed)
[employer] [facility] [place] [facility]   PATIENT NAME: [name] [name]    MR#:  [id number]  DATE OF BIRTH:  Mar 13, 1928  SUBJECTIVE:   Patient with some shortness of breath and cough.  Off BiPAP  REVIEW OF SYSTEMS:    Review of Systems  Constitutional: Negative for fever, chills weight loss HENT: Negative for ear pain, nosebleeds, congestion, facial swelling, rhinorrhea, neck pain, neck stiffness and ear discharge.   Respiratory: ++ for cough, shortness of breath, wheezing  Cardiovascular: Negative for chest pain, palpitations and leg swelling.  Gastrointestinal: Negative for heartburn, abdominal pain, vomiting, diarrhea or consitpation Genitourinary: Negative for dysuria, urgency, frequency, hematuria Musculoskeletal: Negative for back pain or joint pain Neurological: Negative for dizziness, seizures, syncope, focal weakness,  numbness and headaches.  Hematological: Does not bruise/bleed easily.  Psychiatric/Behavioral: Negative for hallucinations, confusion, dysphoric mood    Tolerating Diet: yes      DRUG ALLERGIES:   Allergies  Allergen Reactions  . Aspirin Anaphylaxis, Swelling and Shortness Of Breath    REACTION: terrible bp reaction/wheezing  . Bee Pollen Other (See Comments)    ragweed  . Pollen Extract     ragweed  . Rabbit Epithelium     rabbits    VITALS:  Blood pressure (!) 146/62, pulse 67, temperature 97.8 F (36.6 C), temperature source Oral, resp. rate 15, height 5\' 7"  (1.702 m), weight 68.8 kg, SpO2 95 %.  PHYSICAL EXAMINATION:  Constitutional: Appears frail and critically ill-appearing no distress. HENT: Normocephalic. Marland Kitchen. Oropharynx is clear and moist.  Eyes: Conjunctivae and EOM are normal. PERRLA, no scleral icterus.  Neck: Normal ROM. Neck supple. No JVD. No tracheal deviation. CVS: Tachycardia, irregular irregular, S1/S2 +, 2/6 murmurs, no gallops, no carotid bruit.  Pulmonary: inCreased respiratory effort with bilateral  wheezing/rhonchi.  Abdominal: Soft. BS +,  no distension, tenderness, rebound or guarding.  Musculoskeletal: Normal range of motion. No edema and no tenderness.  Neuro: Alert. CN 2-12 grossly intact. No focal deficits. Skin: Skin is warm and dry. No rash noted. Psychiatric: Normal mood and affect.      LABORATORY PANEL:   CBC Recent Labs  Lab 07/04/18 0511  WBC 19.3*  HGB 9.0*  HCT 29.2*  PLT 309   ------------------------------------------------------------------------------------------------------------------  Chemistries  Recent Labs  Lab 06/28/18 1650  07/04/18 0511  NA 142   < > 138  K 4.0   < > 3.9  CL 109   < > 109  CO2 24   < > 22  GLUCOSE 116*   < > 151*  BUN 31*   < > 39*  CREATININE 1.16   < > 1.53*  CALCIUM 9.2   < > 8.2*  AST 50*  --   --   ALT 27  --   --   ALKPHOS 65  --   --   BILITOT 0.5  --   --    < > = values in this interval not displayed.   ------------------------------------------------------------------------------------------------------------------  Cardiac Enzymes No results for input(s): TROPONINI in the last 168 hours. ------------------------------------------------------------------------------------------------------------------  RADIOLOGY:  [place] Ekg Site Rite  Result Date: 07/03/2018 If Site Rite image not attached, placement could not be confirmed due to current cardiac rhythm.    ASSESSMENT AND PLAN:   85 year old male with a history of PAD CAD, PAF and chronic kidney disease stage III who presented for lower extremity angiogram and found to have acute respiratory distress and atrial fibrillation.  1.  Acute hypoxic respiratory failure  in the setting of atrial fibrillation with RVR and pneumonia: Patient off BiPAP and on nasal cannula Continue steroids Continue cefepime and vancomycin for pneumonia Intensivist consultation appreciated 2.  Atrial fibrillation with RVR, normal sinus rhythm: Cardiology consultation  appreciated Continue diltiazem and amiodarone Continue heparin drip  3.  PAD with leg claudication: Patient's respiratory status is very tenuous Management as per vascular surgery  4.  Chronic kidney disease stage III with stable creatinine 5.  Hypokalemia: Repleted  6.  Hypothyroidism with decreased TSH: Follow-up on T3 and T4  7.  Dementia: Continue donepezil  Patient would benefit from palliative care consult as overall prognosis is very poor Management plans discussed with the patient and he is in agreement.  CODE STATUS: DNR  TOTAL TIME TAKING CARE OF THIS PATIENT: 28 minutes.     POSSIBLE D/C ??, DEPENDING ON CLINICAL CONDITION.   Nellie Pester M.D on 07/04/2018 at 1:20 PM  Between 7am to 6pm - Pager - [phone] After 6pm go to [email or web address] - password [email or web address]  [facility]  Office  [phone]  CC: Primary care physician; Kerman Passey, MD  Note: This dictation was prepared with Dragon dictation along with smaller phrase technology. Any transcriptional errors that result from this process are unintentional.

## 2018-07-04 NOTE — Progress Notes (Signed)
Received call from pt's daughter, Aurther Loft.  Update given and all questions answered.

## 2018-07-04 NOTE — Progress Notes (Signed)
CRITICAL CARE NOTE  CC   respiratory failure  SUBJECTIVE Patient remains critically ill Prognosis is guarded Placed back on biPAP  Patient is now DNR/DNI He refuses bipap intermittently      BP (!) 156/67   Pulse 69   Temp 98.7 F (37.1 C) (Axillary)   Resp (!) 23   Ht 5\' 7"  (1.702 m)   Wt 68.8 kg   SpO2 97%   BMI 23.76 kg/m    REVIEW OF SYSTEMS  PATIENT IS UNABLE TO PROVIDE COMPLETE REVIEW OF SYSTEMS DUE TO SEVERE CRITICAL ILLNESS   PHYSICAL EXAMINATION:  GENERAL:critically ill appearing, +resp distress HEAD: Normocephalic, atraumatic.  EYES: Pupils equal, round, reactive to light.  No scleral icterus.  MOUTH: Moist mucosal membrane. NECK: Supple. No thyromegaly. No nodules. No JVD.  PULMONARY: +rhonchi, +wheezing CARDIOVASCULAR: S1 and S2. Regular rate and rhythm. No murmurs, rubs, or gallops.  GASTROINTESTINAL: Soft, nontender, -distended. No masses. Positive bowel sounds. No hepatosplenomegaly.  MUSCULOSKELETAL: No swelling, clubbing, or edema.  NEUROLOGIC: alert and awake SKIN:intact,warm,dry      Indwelling Urinary Catheter continued, requirement due to   Reason to continue Indwelling Urinary Catheter for strict Intake/Output monitoring for hemodynamic instability      ASSESSMENT AND PLAN SYNOPSIS  85 yo thin frail WM with acute and severe resp failure from afib with RVR pulm edema with extensive vascular disease and very poor resp insufficiency, underlying pneumonia  Severe Hypoxic and Hypercapnic Respiratory Failure DNR/DNI intermittent biPAP -continue Bronchodilator Therapy -Wean Fio2 Continue IV steroids   CARDIAC FAILURE-afib with RVR -oxygen as needed -Lasix as tolerated -follow up cardiac enzymes as indicated Follow up cards recs   Renal Failure- -follow chem 7 -follow UO -continue Foley Catheter-assess need daily    CARDIAC ICU monitoring  ID-pneumonia -continue IV abx as prescibed -follow up  cultures  GI/Nutrition GI PROPHYLAXIS as indicated DIET-->NPO Constipation protocol as indicated  ENDO - ICU hypoglycemic\Hyperglycemia protocol -check FSBS per protocol   ELECTROLYTES -follow labs as needed -replace as needed -pharmacy consultation and following   DVT/GI PRX ordered TRANSFUSIONS AS NEEDED MONITOR FSBS ASSESS the need for LABS as needed   Critical Care Time devoted to patient care services described in this note is 32 minutes.   Overall, patient is critically ill, prognosis is guarded.  Patient with Multiorgan failure and at high risk for cardiac arrest and death.    Lucie Leather, M.D.  [facility]  Medical Director [facility] Medical Director [facility] Cardio-Pulmonary Department

## 2018-07-04 NOTE — Progress Notes (Signed)
Progress Note  Patient Name: [name] [name] Date of Encounter: 07/04/2018  Primary Cardiologist: Yvonne Kendall, MD , [facility]  Subjective   Denies chest pain. Still coughing. Denies fever Inpatient Medications    Scheduled Meds: . atenolol  50 mg Oral Daily  . budesonide (PULMICORT) nebulizer solution  0.5 mg Nebulization BID  . cholecalciferol  5,000 Units Oral Daily  . clopidogrel  75 mg Oral Daily  . donepezil  5 mg Oral QPM  . ipratropium-albuterol  3 mL Nebulization Q4H  . methylPREDNISolone (SOLU-MEDROL) injection  60 mg Intravenous Q6H  . omega-3 acid ethyl esters  1 g Oral Daily  . senna-docusate  1 tablet Oral BID  . sodium chloride flush  10-40 mL Intracatheter Q12H  . sodium chloride flush  3 mL Intravenous Q12H  . vitamin B-12  1,000 mcg Oral Daily   Continuous Infusions: . sodium chloride    . sodium chloride 75 mL/hr at 07/04/18 0600  . albuterol 10 mg/hr (07/03/18 0219)  . amiodarone 30 mg/hr (07/04/18 0600)  . ceFEPime (MAXIPIME) IV 2 g (07/04/18 1018)  . diltiazem (CARDIZEM) infusion Stopped (07/03/18 1244)  . heparin 1,100 Units/hr (07/04/18 1013)  . vancomycin 750 mg (07/04/18 0619)   PRN Meds: sodium chloride, acetaminophen **OR** acetaminophen, levalbuterol, loratadine, morphine injection, ondansetron **OR** ondansetron (ZOFRAN) IV, oxyCODONE, sodium chloride flush, sodium chloride flush   Vital Signs    Vitals:   07/04/18 0404 07/04/18 0500 07/04/18 0600 07/04/18 0800  BP:  (!) 151/61 (!) 157/70 (!) 146/62  Pulse:  62 66 67  Resp:  20 19 15   Temp:    97.8 F (36.6 C)  TempSrc:    Oral  SpO2: 97% 98% 99% 95%  Weight:      Height:        Intake/Output Summary (Last 24 hours) at 07/04/2018 1105 Last data filed at 07/04/2018 0900 Gross per 24 hour  Intake 4521.59 ml  Output 475 ml  Net 4046.59 ml    I/O    TOTAL   + 8 Liters    Filed Weights   06/28/18 1553 07/02/18 1125  Weight: 68.9 kg 68.8 kg    Telemetry    - Personally  Reviewed  ECG      Physical Exam   GEN: elderly appearing, frail but No acute distress.  BiPAP removed Neck: No JVD Cardiac: RRR, no murmurs, rubs, or gallops.  Respiratory:  Diffuse wheezing bilaterally GI: Soft, nontender, non-distended  MS: No edema; No deformity.   Labs    Chemistry Recent Labs  Lab 06/28/18 1650  07/01/18 0449 07/03/18 0256 07/03/18 1318 07/04/18 0511  NA 142   < > 137 140  --  138  K 4.0   < > 3.9 2.8* 3.1* 3.9  CL 109   < > 110 103  --  109  CO2 24   < > 21* 22  --  22  GLUCOSE 116*   < > 116* 194*  --  151*  BUN 31*   < > 28* 30*  --  39*  CREATININE 1.16   < > 1.06 1.53*  --  1.53*  CALCIUM 9.2   < > 8.2* 8.6*  --  8.2*  PROT 7.1  --   --   --   --   --   ALBUMIN 3.7  --   --   --   --   --   AST 50*  --   --   --   --   --  ALT 27  --   --   --   --   --   ALKPHOS 65  --   --   --   --   --   BILITOT 0.5  --   --   --   --   --   GFRNONAA 56*   < > >60 40*  --  40*  GFRAA >60   < > >60 46*  --  46*  ANIONGAP 9   < > 6 15  --  7   < > = values in this interval not displayed.     Hematology Recent Labs  Lab 07/02/18 0439 07/03/18 0256 07/04/18 0511  WBC 17.5* 20.0* 19.3*  RBC 3.98* 4.32 3.84*  HGB 9.3* 10.1* 9.0*  HCT 31.3* 33.2* 29.2*  MCV 78.6* 76.9* 76.0*  MCH 23.4* 23.4* 23.4*  MCHC 29.7* 30.4 30.8  RDW 15.8* 15.8* 16.0*  PLT 300 343 309    Cardiac EnzymesNo results for input(s): TROPONINI in the last 168 hours. No results for input(s): TROPIPOC in the last 168 hours.   BNPNo results for input(s): BNP, PROBNP in the last 168 hours.   DDimer No results for input(s): DDIMER in the last 168 hours.   Radiology    [place] Ekg Site Rite  Result Date: 07/03/2018 [facility], placement could not be confirmed due to current cardiac rhythm.   Cardiac Studies     Patient Profile     [name] [name] is an 85 year old gentleman with history of paroxysmal atrial fibrillation, coronary artery disease occlusion  of the LAD, prior STEMI PCI to the distal RCA, aortic atherosclerosis, stage III renal failure, PAD, s/p SFA/popliteal stent, history of DVT and PE, prior smoker who presented today for lower extremity angiogram with Dr. dew found to have acute respiratory distress, atrial fibrillation with RVR and procedure was canceled  Assessment & Plan    1) paroxysmal atrial fibrillation with RVR - he has now reverted back to NSR.   acute respiratory distress/wheezing, pulmonary disease/bronchitis probably culprit. -Converted back to normal sinus rhythm in less than 24 hours on amiodarone and diltiazem infusion -He is on heparin infusion. ,CHA2DS2VASc score of at least4 (HTN, agex2, vascular). Would consider continuing both low-dose diltiazem and amiodarone until he is off BiPAP, swallowing pills at which point infusions could be changed to oral pill -High risk of recurrent arrhythmia in the setting of underlying lung disease and wheezing/respiratory distress  2) acute respiratory distress  being treated with broad-spectrum antibiotics   3) CAD with stable angina High risk of ischemia given known disease, aortic atherosclerosis, vasculopath  also with PAD lower extremities Prior echocardiogram March 2019 with normal ejection fraction 55 to 60% No further ischemic work-up at this time  4) PAD, leg claudication Will need to work on respiratory status through the weekend in preparation for lower extremity angiography potentially next Monday. Might consider waiting until he has stabilized first.  5)   CKD    Cr 1.53 today   Stable    6)   Thyroid      Pt is hyperthyroid with  TSH 0.018   Check Free T3, Free T4    On amiodarone He will need treatment for this as an outpatient.   7   DNI / DNR      Total encounter time more than 25 minutes  Greater than 50% was spent in counseling and coordination of care with the patient   For  questions or updates, please contact [facility] Please  consult [email or web address] for contact info under        Signed, Lewayne BuntingGregg Kameron Glazebrook, MD  07/04/2018, 11:05 AM

## 2018-07-04 NOTE — Progress Notes (Signed)
ANTICOAGULATION CONSULT NOTE  Pharmacy Consult for Heparin  Indication: arterial occlusion  Patient Measurements: Height: 5\' 7"  (170.2 cm) Weight: 151 lb 10.8 oz (68.8 kg) IBW/kg (Calculated) : 66.1 Heparin Dosing Weight:  68.9 kg   Vital Signs: Temp: 97.8 F (36.6 C) (01/04 1700) Temp Source: Axillary (01/04 1700) BP: 137/56 (01/04 2000) Pulse Rate: 60 (01/04 2003)  Labs: Recent Labs    07/02/18 0439 07/03/18 0256 07/03/18 1318 07/04/18 0511 07/04/18 1345 07/04/18 2200  HGB 9.3* 10.1*  --  9.0*  --   --   HCT 31.3* 33.2*  --  29.2*  --   --   PLT 300 343  --  309  --   --   HEPARINUNFRC 0.36 0.42  --  0.72* 0.65 0.69  CREATININE  --  1.53*  --  1.53*  --   --   CKTOTAL 1,178*  --  686*  --   --   --     Estimated Creatinine Clearance: 31.2 mL/min (A) (by C-G formula based on SCr of 1.53 mg/dL (H)).   Medical History: Past Medical History:  Diagnosis Date  . Arthritis   . CKD (chronic kidney disease) stage 3, GFR 30-59 ml/min (HCC) 12/29/2015  . Dry gangrene (HCC) 06/16/2017   Great toe and 5th toe LEFT foot; Dr. Wyn Quaker  . Hyperlipidemia   . Hypertension   . Left leg DVT (HCC) 01/2014  . Myocardial infarction (HCC)   . Pulmonary embolism (HCC) 01/2014  . Thoracic aorta atherosclerosis (HCC) 10/08/2016    Medications:  Medications Prior to Admission  Medication Sig Dispense Refill Last Dose  . atenolol (TENORMIN) 50 MG tablet TAKE 1 TABLET (50 MG TOTAL) BY MOUTH DAILY. 90 tablet 1 06/27/2018 at 0800  . atorvastatin (LIPITOR) 10 MG tablet TAKE 1 TABLET BY MOUTH EVERYDAY AT BEDTIME 90 tablet 1 06/27/2018 at 2000  . Cholecalciferol (VITAMIN D-1000 MAX ST) 1000 units tablet Take 5 tablets by mouth daily.   06/27/2018 at 0800  . clopidogrel (PLAVIX) 75 MG tablet Take 1 tablet (75 mg total) by mouth daily. Use INSTEAD of Eliquis 30 tablet 0 06/28/2018 at 0800  . donepezil (ARICEPT) 5 MG tablet Take 1 tablet by mouth every evening.  6 06/27/2018 at 2000  . loratadine  (CLARITIN) 10 MG tablet Take 1 tablet (10 mg total) by mouth daily as needed for allergies.   06/27/2018 at 0800  . Omega-3 Fatty Acids (FISH OIL PO) Take 1 capsule by mouth 2 (two) times daily.   06/27/2018 at 0800  . triamcinolone lotion (KENALOG) 0.1 % Apply 1 application topically 3 (three) times daily. 120 mL 1 06/27/2018 at 0800  . vitamin B-12 (CYANOCOBALAMIN) 1000 MCG tablet Take 1 tablet by mouth daily.   06/27/2018 at 0800  . albuterol (PROVENTIL HFA;VENTOLIN HFA) 108 (90 Base) MCG/ACT inhaler Inhale 2 puffs into the lungs every 4 (four) hours as needed for wheezing or shortness of breath. (Patient not taking: Reported on 06/28/2018) 1 Inhaler 1 Not Taking at Unknown time    Assessment: Pharmacy consulted to dose heparin in this 85 year old male with occluded artery. Patient's anti-Xa level mildly elevated this am and heparin rate decreased to 1100 units/hr.   Goal of Therapy:  Heparin level 0.3-0.7 units/ml Monitor platelets by anticoagulation protocol: Yes   Plan:  01/04 @ 2200 HL 0.69 trending up, will decrease rate to 1000 units/hr and will recheck @ 0700. CBC check w/ am labs.  Thomasene Ripple, PharmD, BCPS Clinical Pharmacist  07/04/2018  

## 2018-07-04 NOTE — Progress Notes (Signed)
ANTICOAGULATION CONSULT NOTE  Pharmacy Consult for Heparin  Indication: arterial occlusion  Patient Measurements: Height: 5\' 7"  (170.2 cm) Weight: 151 lb 10.8 oz (68.8 kg) IBW/kg (Calculated) : 66.1 Heparin Dosing Weight:  68.9 kg   Vital Signs: Temp: 98 F (36.7 C) (01/04 0400) Temp Source: Axillary (01/04 0400) BP: 167/63 (01/04 0300) Pulse Rate: 63 (01/04 0300)  Labs: Recent Labs    07/02/18 0439 07/03/18 0256 07/03/18 1318 07/04/18 0511  HGB 9.3* 10.1*  --  9.0*  HCT 31.3* 33.2*  --  29.2*  PLT 300 343  --  309  HEPARINUNFRC 0.36 0.42  --  0.72*  CREATININE  --  1.53*  --   --   CKTOTAL 1,178*  --  686*  --     Estimated Creatinine Clearance: 31.2 mL/min (A) (by C-G formula based on SCr of 1.53 mg/dL (H)).   Medical History: Past Medical History:  Diagnosis Date  . Arthritis   . CKD (chronic kidney disease) stage 3, GFR 30-59 ml/min (HCC) 12/29/2015  . Dry gangrene (HCC) 06/16/2017   Great toe and 5th toe LEFT foot; Dr. Wyn Quakerew  . Hyperlipidemia   . Hypertension   . Left leg DVT (HCC) 01/2014  . Myocardial infarction (HCC)   . Pulmonary embolism (HCC) 01/2014  . Thoracic aorta atherosclerosis (HCC) 10/08/2016    Medications:  Medications Prior to Admission  Medication Sig Dispense Refill Last Dose  . atenolol (TENORMIN) 50 MG tablet TAKE 1 TABLET (50 MG TOTAL) BY MOUTH DAILY. 90 tablet 1 06/27/2018 at 0800  . atorvastatin (LIPITOR) 10 MG tablet TAKE 1 TABLET BY MOUTH EVERYDAY AT BEDTIME 90 tablet 1 06/27/2018 at 2000  . Cholecalciferol (VITAMIN D-1000 MAX ST) 1000 units tablet Take 5 tablets by mouth daily.   06/27/2018 at 0800  . clopidogrel (PLAVIX) 75 MG tablet Take 1 tablet (75 mg total) by mouth daily. Use INSTEAD of Eliquis 30 tablet 0 06/28/2018 at 0800  . donepezil (ARICEPT) 5 MG tablet Take 1 tablet by mouth every evening.  6 06/27/2018 at 2000  . loratadine (CLARITIN) 10 MG tablet Take 1 tablet (10 mg total) by mouth daily as needed for allergies.    06/27/2018 at 0800  . Omega-3 Fatty Acids (FISH OIL PO) Take 1 capsule by mouth 2 (two) times daily.   06/27/2018 at 0800  . triamcinolone lotion (KENALOG) 0.1 % Apply 1 application topically 3 (three) times daily. 120 mL 1 06/27/2018 at 0800  . vitamin B-12 (CYANOCOBALAMIN) 1000 MCG tablet Take 1 tablet by mouth daily.   06/27/2018 at 0800  . albuterol (PROVENTIL HFA;VENTOLIN HFA) 108 (90 Base) MCG/ACT inhaler Inhale 2 puffs into the lungs every 4 (four) hours as needed for wheezing or shortness of breath. (Patient not taking: Reported on 06/28/2018) 1 Inhaler 1 Not Taking at Unknown time    Assessment: Pharmacy consulted to dose heparin in this 85 year old male with occluded artery.   No prior anticoag noted.  Hgb still trending downward with PLT wnl;  will continue to monitor.  Heparin Course: 12/29 PM initiation: 4800 unit bolus, then 1200 units/hr 12/30 0100 HL 0.97: dec rate to 1100 units/hr 12/30 0956 HL 0.73: dec rate to 1000 units/hr 12/30 2035 HL 0.43 12/31 0416 HL 0.29: increase rate to 1050 units/hr 12/31 1246 HL 0.20 12/31 2300 HL 0.43 01/01 0700 HL 0.48 01/02 0439 HL 0.36 01/03 0256 HL 0.42  Goal of Therapy:  Heparin level 0.3-0.7 units/ml Monitor platelets by anticoagulation protocol: Yes  Plan:  01/04 0511 HL 0.72 is slightly supratherapeutic. Will decrease Heparin to 1100 units/hr. Next Heparin level in 8 hours. Daily CBC while on Heparin drip.  Clovia Cuff, PharmD, BCPS 07/04/2018 5:56 AM

## 2018-07-04 NOTE — Progress Notes (Signed)
ANTICOAGULATION CONSULT NOTE  Pharmacy Consult for Heparin  Indication: arterial occlusion  Patient Measurements: Height: 5\' 7"  (170.2 cm) Weight: 151 lb 10.8 oz (68.8 kg) IBW/kg (Calculated) : 66.1 Heparin Dosing Weight:  68.9 kg   Vital Signs: Temp: 97.8 F (36.6 C) (01/04 0800) Temp Source: Oral (01/04 0800) BP: 186/76 (01/04 1400) Pulse Rate: 66 (01/04 1400)  Labs: Recent Labs    07/02/18 0439 07/03/18 0256 07/03/18 1318 07/04/18 0511 07/04/18 1345  HGB 9.3* 10.1*  --  9.0*  --   HCT 31.3* 33.2*  --  29.2*  --   PLT 300 343  --  309  --   HEPARINUNFRC 0.36 0.42  --  0.72* 0.65  CREATININE  --  1.53*  --  1.53*  --   CKTOTAL 1,178*  --  686*  --   --     Estimated Creatinine Clearance: 31.2 mL/min (A) (by C-G formula based on SCr of 1.53 mg/dL (H)).   Medical History: Past Medical History:  Diagnosis Date  . Arthritis   . CKD (chronic kidney disease) stage 3, GFR 30-59 ml/min (HCC) 12/29/2015  . Dry gangrene (HCC) 06/16/2017   Great toe and 5th toe LEFT foot; Dr. Wyn Quaker  . Hyperlipidemia   . Hypertension   . Left leg DVT (HCC) 01/2014  . Myocardial infarction (HCC)   . Pulmonary embolism (HCC) 01/2014  . Thoracic aorta atherosclerosis (HCC) 10/08/2016    Medications:  Medications Prior to Admission  Medication Sig Dispense Refill Last Dose  . atenolol (TENORMIN) 50 MG tablet TAKE 1 TABLET (50 MG TOTAL) BY MOUTH DAILY. 90 tablet 1 06/27/2018 at 0800  . atorvastatin (LIPITOR) 10 MG tablet TAKE 1 TABLET BY MOUTH EVERYDAY AT BEDTIME 90 tablet 1 06/27/2018 at 2000  . Cholecalciferol (VITAMIN D-1000 MAX ST) 1000 units tablet Take 5 tablets by mouth daily.   06/27/2018 at 0800  . clopidogrel (PLAVIX) 75 MG tablet Take 1 tablet (75 mg total) by mouth daily. Use INSTEAD of Eliquis 30 tablet 0 06/28/2018 at 0800  . donepezil (ARICEPT) 5 MG tablet Take 1 tablet by mouth every evening.  6 06/27/2018 at 2000  . loratadine (CLARITIN) 10 MG tablet Take 1 tablet (10 mg total)  by mouth daily as needed for allergies.   06/27/2018 at 0800  . Omega-3 Fatty Acids (FISH OIL PO) Take 1 capsule by mouth 2 (two) times daily.   06/27/2018 at 0800  . triamcinolone lotion (KENALOG) 0.1 % Apply 1 application topically 3 (three) times daily. 120 mL 1 06/27/2018 at 0800  . vitamin B-12 (CYANOCOBALAMIN) 1000 MCG tablet Take 1 tablet by mouth daily.   06/27/2018 at 0800  . albuterol (PROVENTIL HFA;VENTOLIN HFA) 108 (90 Base) MCG/ACT inhaler Inhale 2 puffs into the lungs every 4 (four) hours as needed for wheezing or shortness of breath. (Patient not taking: Reported on 06/28/2018) 1 Inhaler 1 Not Taking at Unknown time    Assessment: Pharmacy consulted to dose heparin in this 85 year old male with occluded artery. Patient's anti-Xa level mildly elevated this am and heparin rate decreased to 1100 units/hr.   Goal of Therapy:  Heparin level 0.3-0.7 units/ml Monitor platelets by anticoagulation protocol: Yes   Plan:  Continue heparin at 1100 units/hr. Will obtain confirmatory level   MLS 07/04/2018 2:37 PM

## 2018-07-05 LAB — BASIC METABOLIC PANEL
Anion gap: 6 (ref 5–15)
BUN: 43 mg/dL — AB (ref 8–23)
CO2: 23 mmol/L (ref 22–32)
Calcium: 8.4 mg/dL — ABNORMAL LOW (ref 8.9–10.3)
Chloride: 106 mmol/L (ref 98–111)
Creatinine, Ser: 1.6 mg/dL — ABNORMAL HIGH (ref 0.61–1.24)
GFR calc Af Amer: 44 mL/min — ABNORMAL LOW (ref >60)
GFR calc non Af Amer: 38 mL/min — ABNORMAL LOW (ref >60)
Glucose, Bld: 140 mg/dL — ABNORMAL HIGH (ref 70–99)
Potassium: 3.6 mmol/L (ref 3.5–5.1)
Sodium: 135 mmol/L (ref 135–145)

## 2018-07-05 LAB — CBC
HEMATOCRIT: 28.8 % — AB (ref 39.0–52.0)
Hemoglobin: 9 g/dL — ABNORMAL LOW (ref 13.0–17.0)
MCH: 23.6 pg — ABNORMAL LOW (ref 26.0–34.0)
MCHC: 31.3 g/dL (ref 30.0–36.0)
MCV: 75.4 fL — ABNORMAL LOW (ref 80.0–100.0)
Platelets: 292 10*3/uL (ref 150–400)
RBC: 3.82 MIL/uL — ABNORMAL LOW (ref 4.22–5.81)
RDW: 15.9 % — ABNORMAL HIGH (ref 11.5–15.5)
WBC: 19.7 10*3/uL — ABNORMAL HIGH (ref 4.0–10.5)
nRBC: 0.2 % (ref 0.0–0.2)

## 2018-07-05 LAB — HEPARIN LEVEL (UNFRACTIONATED)
Heparin Unfractionated: 0.53 IU/mL (ref 0.30–0.70)
Heparin Unfractionated: 0.59 IU/mL (ref 0.30–0.70)

## 2018-07-05 LAB — CK: Total CK: 368 U/L (ref 49–397)

## 2018-07-05 LAB — GLUCOSE, CAPILLARY: Glucose-Capillary: 129 mg/dL — ABNORMAL HIGH (ref 70–99)

## 2018-07-05 MED ORDER — TRIAMCINOLONE ACETONIDE 0.1 % EX CREA
1.0000 "application " | TOPICAL_CREAM | Freq: Three times a day (TID) | CUTANEOUS | Status: DC
  Administered 2018-07-05 – 2018-07-10 (×13): 1 via TOPICAL
  Filled 2018-07-05 (×3): qty 15

## 2018-07-05 MED ORDER — AMIODARONE HCL 200 MG PO TABS
400.0000 mg | ORAL_TABLET | Freq: Every day | ORAL | Status: DC
  Administered 2018-07-05 – 2018-07-10 (×6): 400 mg via ORAL
  Filled 2018-07-05 (×6): qty 2

## 2018-07-05 NOTE — Progress Notes (Signed)
[facility] - [place] at [facility]   PATIENT NAME: [name] [name]    MR#:  [id number]  DATE OF BIRTH:  01-22-1928  SUBJECTIVE:   Patient on BiPAP this morning Converted to normal sinus rhythm REVIEW OF SYSTEMS:    Unable to obtain patient is confused   Tolerating Diet: yes      DRUG ALLERGIES:   Allergies  Allergen Reactions  . Aspirin Anaphylaxis, Swelling and Shortness Of Breath    REACTION: terrible bp reaction/wheezing  . Bee Pollen Other (See Comments)    ragweed  . Pollen Extract     ragweed  . Rabbit Epithelium     rabbits    VITALS:  Blood pressure (!) 122/52, pulse (!) 58, temperature 98.8 F (37.1 C), temperature source Axillary, resp. rate (!) 25, height 5\' 7"  (1.702 m), weight 68.8 kg, SpO2 95 %.  PHYSICAL EXAMINATION:  Constitutional: Appears frail and critically ill-appearing no distress. HENT: Normocephalic. Marland Kitchen BiPAP  eyes: Conjunctivae and EOM are normal. PERRLA, no scleral icterus.  Neck: Normal ROM. Neck supple. No JVD. No tracheal deviation. CVS: Tachycardia regular rate and rhythm S1/S2 +, 2/6 murmurs, no gallops, no carotid bruit.  Pulmonary: bilateral wheezing/rhonchi Abdominal: Soft. BS +,  no distension, tenderness, rebound or guarding.  Musculoskeletal: Normal range of motion. No edema and no tenderness.  Neuro: Alert. . No focal deficits. Skin: Skin is warm and dry.  Diffuse maculopapular rash lower extremity Pulses not palpable left distal foot which is cool to touch    LABORATORY PANEL:   CBC Recent Labs  Lab 07/05/18 0416  WBC 19.7*  HGB 9.0*  HCT 28.8*  PLT 292   ------------------------------------------------------------------------------------------------------------------  Chemistries  Recent Labs  Lab 06/28/18 1650  07/05/18 0416  NA 142   < > 135  K 4.0   < > 3.6  CL 109   < > 106  CO2 24   < > 23  GLUCOSE 116*   < > 140*  BUN 31*   < > 43*  CREATININE 1.16   < > 1.60*  CALCIUM 9.2   <  > 8.4*  AST 50*  --   --   ALT 27  --   --   ALKPHOS 65  --   --   BILITOT 0.5  --   --    < > = values in this interval not displayed.   ------------------------------------------------------------------------------------------------------------------  Cardiac Enzymes No results for input(s): TROPONINI in the last 168 hours. ------------------------------------------------------------------------------------------------------------------  RADIOLOGY:  No results found.   ASSESSMENT AND PLAN:   85 year old male with a history of PAD CAD, PAF and chronic kidney disease stage III who presented for lower extremity angiogram and found to have acute respiratory distress and atrial fibrillation.  1.  Acute hypoxic respiratory failure in the setting of atrial fibrillation with RVR and pneumonia: Patient back on BiPAP this morning with bronchospasm. Continue steroids Continue cefepime and vancomycin for pneumonia Intensivist consultation appreciated 2.  Atrial fibrillation with RVR, normal sinus rhythm: Cardiology consultation appreciated Continue diltiazem, atenolol and amiodarone Continue heparin drip  3.  PAD with leg claudication: Patient's respiratory status is very tenuous Management as per vascular surgery Continue Plavix Plan for possible angiogram tomorrow if patient stable 4.  Chronic kidney disease stage III with stable creatinine 5.  Hypokalemia: Repleted  6.  Hypothyroidism with decreased TSH: T4 is normal  7.  Dementia: Continue donepezil  Patient would benefit from palliative care consult as overall prognosis is very  poor  CODE STATUS: DNR  TOTAL TIME TAKING CARE OF THIS PATIENT: 24 minutes.     POSSIBLE D/C ??, DEPENDING ON CLINICAL CONDITION.   Shakeira Rhee M.D on 07/05/2018 at 11:29 AM  Between 7am to 6pm - Pager - [phone] After 6pm go to [email or web address] - password [email or web address]  [facility]  Office  [phone]  CC: Primary care  physician; Kerman Passey, MD  Note: This dictation was prepared with Dragon dictation along with smaller phrase technology. Any transcriptional errors that result from this process are unintentional.

## 2018-07-05 NOTE — Progress Notes (Signed)
ANTICOAGULATION CONSULT NOTE  Pharmacy Consult for Heparin  Indication: arterial occlusion  Patient Measurements: Height: 5\' 7"  (170.2 cm) Weight: 151 lb 10.8 oz (68.8 kg) IBW/kg (Calculated) : 66.1 Heparin Dosing Weight:  68.9 kg   Vital Signs: Temp: 98.3 F (36.8 C) (01/05 0400) BP: 128/55 (01/05 0500) Pulse Rate: 56 (01/05 0729)  Labs: Recent Labs    07/03/18 0256 07/03/18 1318 07/04/18 0511 07/04/18 1345 07/04/18 2200 07/05/18 0416 07/05/18 0736  HGB 10.1*  --  9.0*  --   --  9.0*  --   HCT 33.2*  --  29.2*  --   --  28.8*  --   PLT 343  --  309  --   --  292  --   HEPARINUNFRC 0.42  --  0.72* 0.65 0.69  --  0.53  CREATININE 1.53*  --  1.53*  --   --  1.60*  --   CKTOTAL  --  686*  --   --   --   --   --     Estimated Creatinine Clearance: 29.8 mL/min (A) (by C-G formula based on SCr of 1.6 mg/dL (H)).   Medical History: Past Medical History:  Diagnosis Date  . Arthritis   . CKD (chronic kidney disease) stage 3, GFR 30-59 ml/min (HCC) 12/29/2015  . Dry gangrene (HCC) 06/16/2017   Great toe and 5th toe LEFT foot; Dr. Wyn Quakerew  . Hyperlipidemia   . Hypertension   . Left leg DVT (HCC) 01/2014  . Myocardial infarction (HCC)   . Pulmonary embolism (HCC) 01/2014  . Thoracic aorta atherosclerosis (HCC) 10/08/2016    Medications:  Medications Prior to Admission  Medication Sig Dispense Refill Last Dose  . atenolol (TENORMIN) 50 MG tablet TAKE 1 TABLET (50 MG TOTAL) BY MOUTH DAILY. 90 tablet 1 06/27/2018 at 0800  . atorvastatin (LIPITOR) 10 MG tablet TAKE 1 TABLET BY MOUTH EVERYDAY AT BEDTIME 90 tablet 1 06/27/2018 at 2000  . Cholecalciferol (VITAMIN D-1000 MAX ST) 1000 units tablet Take 5 tablets by mouth daily.   06/27/2018 at 0800  . clopidogrel (PLAVIX) 75 MG tablet Take 1 tablet (75 mg total) by mouth daily. Use INSTEAD of Eliquis 30 tablet 0 06/28/2018 at 0800  . donepezil (ARICEPT) 5 MG tablet Take 1 tablet by mouth every evening.  6 06/27/2018 at 2000  .  loratadine (CLARITIN) 10 MG tablet Take 1 tablet (10 mg total) by mouth daily as needed for allergies.   06/27/2018 at 0800  . Omega-3 Fatty Acids (FISH OIL PO) Take 1 capsule by mouth 2 (two) times daily.   06/27/2018 at 0800  . triamcinolone lotion (KENALOG) 0.1 % Apply 1 application topically 3 (three) times daily. 120 mL 1 06/27/2018 at 0800  . vitamin B-12 (CYANOCOBALAMIN) 1000 MCG tablet Take 1 tablet by mouth daily.   06/27/2018 at 0800  . albuterol (PROVENTIL HFA;VENTOLIN HFA) 108 (90 Base) MCG/ACT inhaler Inhale 2 puffs into the lungs every 4 (four) hours as needed for wheezing or shortness of breath. (Patient not taking: Reported on 06/28/2018) 1 Inhaler 1 Not Taking at Unknown time    Assessment: Pharmacy consulted to dose heparin in this 85 year old male with occluded artery. Heparin rate decreased around 2300 on 1/4 to 1000 units/hr.   Goal of Therapy:  Heparin level 0.3-0.7 units/ml Monitor platelets by anticoagulation protocol: Yes   Plan:  Anti-Xa level is currently within target range. Will continue heparin infusion at 1000 units/hr. Will obtain confirmatory Heparin level  at 1500.   Pharmacy will continue to monitor and adjust per consult.   MLS 07/05/2018

## 2018-07-05 NOTE — Progress Notes (Signed)
Progress Note  Patient Name: [name] [name] Date of Encounter: 07/05/2018  Primary Cardiologist: Yvonne Kendallhristopher End, MD   Subjective   Denies chest pain or sob.   Inpatient Medications    Scheduled Meds: . amiodarone  400 mg Oral Daily  . atenolol  50 mg Oral Daily  . budesonide (PULMICORT) nebulizer solution  0.5 mg Nebulization BID  . cholecalciferol  5,000 Units Oral Daily  . clopidogrel  75 mg Oral Daily  . donepezil  5 mg Oral QPM  . furosemide  40 mg Intravenous Once  . ipratropium-albuterol  3 mL Nebulization Q4H  . methylPREDNISolone (SOLU-MEDROL) injection  60 mg Intravenous Q6H  . omega-3 acid ethyl esters  1 g Oral Daily  . senna-docusate  1 tablet Oral BID  . sodium chloride flush  10-40 mL Intracatheter Q12H  . sodium chloride flush  3 mL Intravenous Q12H  . triamcinolone cream  1 application Topical TID  . vitamin B-12  1,000 mcg Oral Daily   Continuous Infusions: . sodium chloride    . albuterol 10 mg/hr (07/03/18 0219)  . ceFEPime (MAXIPIME) IV 2 g (07/05/18 1049)  . diltiazem (CARDIZEM) infusion Stopped (07/03/18 1244)  . heparin 1,000 Units/hr (07/05/18 0821)  . vancomycin 750 mg (07/05/18 [id number])   PRN Meds: sodium chloride, acetaminophen **OR** acetaminophen, guaiFENesin, hydrALAZINE, levalbuterol, loratadine, morphine injection, ondansetron **OR** ondansetron (ZOFRAN) IV, oxyCODONE, sodium chloride flush, sodium chloride flush   Vital Signs    Vitals:   07/05/18 1000 07/05/18 1038 07/05/18 1100 07/05/18 1139  BP: (!) 136/52  (!) 122/52   Pulse: 60 (!) 59 (!) 58 64  Resp: 19  (!) 25 19  Temp:      TempSrc:      SpO2: 99%  95% 94%  Weight:      Height:        Intake/Output Summary (Last 24 hours) at 07/05/2018 1206 Last data filed at 07/05/2018 1052 Gross per 24 hour  Intake 1763.69 ml  Output 2720 ml  Net -956.31 ml   Filed Weights   06/28/18 1553 07/02/18 1125  Weight: 68.9 kg 68.8 kg    Telemetry    nsr - Personally Reviewed  ECG     none - Personally Reviewed  Physical Exam   GEN: No acute distress.  Frail Neck: 7 cm JVD Cardiac: RRR, no murmurs, rubs, or gallops.  Respiratory: audible wheezes through out, no increased work of breathing. GI: Soft, nontender, non-distended  MS: No edema; No deformity. Neuro:  Nonfocal  Psych: Normal affect   Labs    Chemistry Recent Labs  Lab 06/28/18 1650  07/03/18 0256 07/03/18 1318 07/04/18 0511 07/05/18 0416  NA 142   < > 140  --  138 135  K 4.0   < > 2.8* 3.1* 3.9 3.6  CL 109   < > 103  --  109 106  CO2 24   < > 22  --  22 23  GLUCOSE 116*   < > 194*  --  151* 140*  BUN 31*   < > 30*  --  39* 43*  CREATININE 1.16   < > 1.53*  --  1.53* 1.60*  CALCIUM 9.2   < > 8.6*  --  8.2* 8.4*  PROT 7.1  --   --   --   --   --   ALBUMIN 3.7  --   --   --   --   --   AST 50*  --   --   --   --   --  ALT 27  --   --   --   --   --   ALKPHOS 65  --   --   --   --   --   BILITOT 0.5  --   --   --   --   --   GFRNONAA 56*   < > 40*  --  40* 38*  GFRAA >60   < > 46*  --  46* 44*  ANIONGAP 9   < > 15  --  7 6   < > = values in this interval not displayed.     Hematology Recent Labs  Lab 07/03/18 0256 07/04/18 0511 07/05/18 0416  WBC 20.0* 19.3* 19.7*  RBC 4.32 3.84* 3.82*  HGB 10.1* 9.0* 9.0*  HCT 33.2* 29.2* 28.8*  MCV 76.9* 76.0* 75.4*  MCH 23.4* 23.4* 23.6*  MCHC 30.4 30.8 31.3  RDW 15.8* 16.0* 15.9*  PLT 343 309 292    Cardiac EnzymesNo results for input(s): TROPONINI in the last 168 hours. No results for input(s): TROPIPOC in the last 168 hours.   BNPNo results for input(s): BNP, PROBNP in the last 168 hours.   DDimer No results for input(s): DDIMER in the last 168 hours.   Radiology    No results found.  Cardiac Studies   none  Patient Profile     85 y.o. male admitted with respiratory failure, Atrial fib, now back to NSR.    Assessment & Plan    1. PAF - he is maintaining NSR. He is off of IV but still on po amiodarone. Continue for  now. 2. Acute respiratory failure - he is improved. sats are good. Being treated for pneumonia and COPD. Still wheezing on exam. Off bipap presently.  3. Peripheral vascular disease - in light of respiratory problems and dementia, I would be loath to proceed with angiography tomorrow.  4. Disp. - Dr. Okey Dupre and associates will return tomorrow.     For questions or updates, please contact [facility] Please consult [email or web address] for contact info under Cardiology/STEMI.      Signed, Lewayne Bunting, MD  07/05/2018, 12:07 PM  Patient ID: [name] Hai, male   DOB: [birth year], 85 y.o.   MRN: [id number]

## 2018-07-05 NOTE — Progress Notes (Signed)
ANTICOAGULATION CONSULT NOTE  Pharmacy Consult for Heparin  Indication: arterial occlusion  Patient Measurements: Height: 5\' 7"  (170.2 cm) Weight: 151 lb 10.8 oz (68.8 kg) IBW/kg (Calculated) : 66.1 Heparin Dosing Weight:  68.9 kg   Vital Signs: Temp: 98.2 F (36.8 C) (01/05 1400) Temp Source: Axillary (01/05 1400) BP: 142/56 (01/05 1500) Pulse Rate: 69 (01/05 1557)  Labs: Recent Labs    07/03/18 0256 07/03/18 1318 07/04/18 0511  07/04/18 2200 07/05/18 0416 07/05/18 0736 07/05/18 1636  HGB 10.1*  --  9.0*  --   --  9.0*  --   --   HCT 33.2*  --  29.2*  --   --  28.8*  --   --   PLT 343  --  309  --   --  292  --   --   HEPARINUNFRC 0.42  --  0.72*   < > 0.69  --  0.53 0.59  CREATININE 1.53*  --  1.53*  --   --  1.60*  --   --   CKTOTAL  --  686*  --   --   --  368  --   --    < > = values in this interval not displayed.    Estimated Creatinine Clearance: 29.8 mL/min (A) (by C-G formula based on SCr of 1.6 mg/dL (H)).   Medical History: Past Medical History:  Diagnosis Date  . Arthritis   . CKD (chronic kidney disease) stage 3, GFR 30-59 ml/min (HCC) 12/29/2015  . Dry gangrene (HCC) 06/16/2017   Great toe and 5th toe LEFT foot; Dr. Wyn Quaker  . Hyperlipidemia   . Hypertension   . Left leg DVT (HCC) 01/2014  . Myocardial infarction (HCC)   . Pulmonary embolism (HCC) 01/2014  . Thoracic aorta atherosclerosis (HCC) 10/08/2016    Medications:  Medications Prior to Admission  Medication Sig Dispense Refill Last Dose  . atenolol (TENORMIN) 50 MG tablet TAKE 1 TABLET (50 MG TOTAL) BY MOUTH DAILY. 90 tablet 1 06/27/2018 at 0800  . atorvastatin (LIPITOR) 10 MG tablet TAKE 1 TABLET BY MOUTH EVERYDAY AT BEDTIME 90 tablet 1 06/27/2018 at 2000  . Cholecalciferol (VITAMIN D-1000 MAX ST) 1000 units tablet Take 5 tablets by mouth daily.   06/27/2018 at 0800  . clopidogrel (PLAVIX) 75 MG tablet Take 1 tablet (75 mg total) by mouth daily. Use INSTEAD of Eliquis 30 tablet 0 06/28/2018  at 0800  . donepezil (ARICEPT) 5 MG tablet Take 1 tablet by mouth every evening.  6 06/27/2018 at 2000  . loratadine (CLARITIN) 10 MG tablet Take 1 tablet (10 mg total) by mouth daily as needed for allergies.   06/27/2018 at 0800  . Omega-3 Fatty Acids (FISH OIL PO) Take 1 capsule by mouth 2 (two) times daily.   06/27/2018 at 0800  . triamcinolone lotion (KENALOG) 0.1 % Apply 1 application topically 3 (three) times daily. 120 mL 1 06/27/2018 at 0800  . vitamin B-12 (CYANOCOBALAMIN) 1000 MCG tablet Take 1 tablet by mouth daily.   06/27/2018 at 0800  . albuterol (PROVENTIL HFA;VENTOLIN HFA) 108 (90 Base) MCG/ACT inhaler Inhale 2 puffs into the lungs every 4 (four) hours as needed for wheezing or shortness of breath. (Patient not taking: Reported on 06/28/2018) 1 Inhaler 1 Not Taking at Unknown time    Assessment: Pharmacy consulted to dose heparin in this 85 year old male with occluded artery. Heparin rate decreased around 2300 on 1/4 to 1000 units/hr.   Goal of Therapy:  Heparin level 0.3-0.7 units/ml Monitor platelets by anticoagulation protocol: Yes   Plan:  Anti-Xa level is currently within target range x 2   Will continue heparin infusion at 1000 units/hr.   Pharmacy will continue to monitor heparin level and CBC daily and adjust per consult.   Albina Billetharles M Glorianne Proctor, PharmD, BCPS Clinical Pharmacist 07/05/2018 5:47 PM

## 2018-07-05 NOTE — Progress Notes (Signed)
Follow up - Critical Care Medicine Note  Patient Details:    [name] MIKLES is an 85 y.o. male.with a past medical history remarkable for hypertension, hyperlipidemia, coronary artery disease, thromboembolic disease, severe peripheral arterial disease, chronic renal failure presented with left-sided leg pain beginning 2 days prior to presentation.  This was associated with claudication with eventual inability to ambulate.  Associated complaints of shortness of breath, wheezing.  Was going to have angiography upon arrival was noted to be in rapid atrial fibrillation with uncontrolled ventricular response along with severe dyspnea.  Patient is being transferred to the intensive care unit for further management.   Lines, Airways, Drains:    Anti-infectives:  Anti-infectives (From admission, onward)   Start     Dose/Rate Route Frequency Ordered Stop   07/04/18 0600  vancomycin (VANCOCIN) IVPB 750 mg/150 ml premix     750 mg 150 mL/hr over 60 Minutes Intravenous Every 24 hours 07/03/18 0813     07/02/18 2200  vancomycin (VANCOCIN) IVPB 1000 mg/200 mL premix  Status:  Discontinued     1,000 mg 200 mL/hr over 60 Minutes Intravenous Every 24 hours 07/02/18 1306 07/03/18 0813   07/02/18 1300  vancomycin (VANCOCIN) IVPB 1000 mg/200 mL premix     1,000 mg 200 mL/hr over 60 Minutes Intravenous  Once 07/02/18 1148 07/02/18 1436   07/02/18 1200  ceFEPIme (MAXIPIME) 2 g in sodium chloride 0.9 % 100 mL IVPB     2 g 200 mL/hr over 30 Minutes Intravenous Every 12 hours 07/02/18 1148     07/02/18 0837  ceFAZolin (ANCEF) IVPB 2g/100 mL premix  Status:  Discontinued     2 g 200 mL/hr over 30 Minutes Intravenous 30 min pre-op 07/02/18 0837 07/02/18 1114   07/01/18 1400  levofloxacin (LEVAQUIN) IVPB 750 mg  Status:  Discontinued     750 mg 100 mL/hr over 90 Minutes Intravenous Every 48 hours 07/01/18 1235 07/02/18 1148      Microbiology: Results for orders placed or performed during the hospital  encounter of 06/28/18  Blood culture (routine x 2)     Status: Abnormal   Collection Time: 06/28/18  4:35 PM  Result Value Ref Range Status   Specimen Description   Final    BLOOD BLOOD RIGHT WRIST Performed at [facility], [address], [place], [place] [address]    Special Requests   Final    BOTTLES DRAWN AEROBIC AND ANAEROBIC Blood Culture adequate volume Performed at [facility], [address] [place], [place] [address]    Culture  Setup Time (A)  Final    GRAM VARIABLE ROD AEROBIC BOTTLE ONLY CRITICAL RESULT CALLED TO, READ BACK BY AND VERIFIED WITHJeralene Peters [facility] AT 3220 06/29/18 SDR Performed at [facility], [address] [place], [place] [address]    Culture (A)  Final    BACILLUS SPECIES Standardized susceptibility testing for this organism is not available. Performed at [facility], [place] [address], [place], [place] [address]    Report Status 07/01/2018 FINAL  Final  Blood culture (routine x 2)     Status: None   Collection Time: 06/28/18  4:51 PM  Result Value Ref Range Status   Specimen Description BLOOD RIGHT ANTECUBITAL  Final   Special Requests   Final    BOTTLES DRAWN AEROBIC AND ANAEROBIC Blood Culture adequate volume   Culture   Final    NO GROWTH 6 DAYS Performed at [facility], [place]  [address], [place], [place] [address]    Report Status 07/04/2018 FINAL  Final  MRSA PCR Screening     Status: None   Collection Time: 07/02/18 11:17 AM  Result Value Ref Range Status   MRSA by PCR NEGATIVE NEGATIVE Final    Comment:        The GeneXpert MRSA Assay (FDA approved for NASAL specimens only), is one component of a comprehensive MRSA colonization surveillance program. It is not intended to diagnose MRSA infection nor to guide or monitor treatment for MRSA infections. Performed at [facility], [address], [place], [place] [address]    Studies: Dg Chest 2 View  Result Date:  06/30/2018 CLINICAL DATA:  Wheezing, hypoxia, former smoker. Previous MI. Patient was hospitalized 2 days ago due to a fall. EXAM: CHEST - 2 VIEW COMPARISON:  PA and lateral chest x-ray of June 14, 2017 FINDINGS: Today's study is obtained in a more lordotic fashion. The lungs are well-expanded. The interstitial markings are coarse. There is thickening of the minor fissure. There is patchy increased density at the left lung base. Previously demonstrated density in the right upper lobe is less conspicuous today. The heart and pulmonary vascularity are normal. There is calcification in the wall of the aortic arch. IMPRESSION: Chronic interstitial changes bilaterally. Slightly increased interstitial density at the left lung base may reflect developing pneumonia. Thoracic aortic atherosclerosis. Electronically Signed   By: David  Martinique M.D.   On: 06/30/2018 14:32   [place] Ekg Site Rite  Result Date: 07/03/2018 If Site Rite image not attached, placement could not be confirmed due to current cardiac rhythm.   Consults: Treatment Team:  Evaristo Bury, MD Hermelinda Dellen, DO Minna Merritts, MD   Subjective:    Overnight Issues: Patient received Lasix yesterday with some improvement, presently on BiPAP, bronchospasm has improved.  On amiodarone, converted to normal sinus rhythm.  Resting comfortably this morning  Objective:  Vital signs for last 24 hours: Temp:  [97.8 F (36.6 C)-98.3 F (36.8 C)] 98.3 F (36.8 C) (01/05 0400) Pulse Rate:  [52-70] 56 (01/05 0729) Resp:  [10-23] 22 (01/05 0729) BP: (124-187)/(51-83) 128/55 (01/05 0500) SpO2:  [93 %-100 %] 100 % (01/05 0729) FiO2 (%):  [40 %-60 %] 40 % (01/05 0729)  Hemodynamic parameters for last 24 hours:    Intake/Output from previous day: 01/04 0701 - 01/05 0700 In: 2120.7 [P.O.:360; I.V.:1410.3; IV Piggyback:350.4] Out: 2650 [Urine:2650]  Intake/Output this shift: No intake/output data recorded.  Vent settings for last 24  hours: FiO2 (%):  [40 %-60 %] 40 %  Physical Exam:  HEENT:           Presently on BiPAP,  improved from yesterday.  Trachea midline, no thyromegaly noted Cardiovascular:        Presently in sinus rhythm, on amiodarone  Pulmonary:       Minimal expiratory wheezing this morning Abdominal:      Positive bowel sounds, soft exam Extremities:      Unable to palpate pulses in the distal left foot, cool to touch, rash has somewhat improved.  Now more scaling much like the right lower extremity scaling.  Pending angiogram tomorrow per Dr. Lazaro Arms and vascular surgery  neurologic:      Patient moves all extremities, cranial nerves are grossly intact of any focal deficits appreciated Cutaneous:      Diffuse maculopapular rash appreciated in particular in his left lower extremity associated with scaling also noted in his right lower extremity.  Assessment/Plan:  Respiratory distress.  Significantly bronchospastic.  On Solu-Medrol, albuterol, Atrovent and BiPAP  Atrial fibrillation with rapid ventricular response.  Converted with amiodarone.  On 5 of Cardizem.  Cardiology following appreciate input  Peripheral arterial disease.  Patient continues to be on heparin, pending angiogram per vascular  Elevated CK.  Continues to decrease, will recheck this morning.  Leukocytosis.  Patient is on cefepime and vancomycin  Anemia no evidence of active bleeding  Cutaneous rash.  Connective tissue work-up thus far reveals a normal complement level, elevated rheumatoid factor, ESR and CRP, extractable nuclear antigens negative except SSA, pending ANCA levels.  If vasculitis should see on angiogram.   Renal insufficiency.  BUN is 43/creatinine 1.6.  Which has worsened from yesterday.   Critical care time 35 minutes  Kenzie Thoreson 07/05/2018  *Care during the described time interval was provided by me and/or other providers on the critical care team.  I have reviewed this patient's available data, including medical  history, events of note, physical examination and test results as part of my evaluation. Patient ID: TRAVEON LOURO, male   DOB: March 18, 1928, 85 y.o.   MRN: [id number]

## 2018-07-05 NOTE — Progress Notes (Signed)
Subjective: Interval History: none..   Objective: Vital signs in last 24 hours: Temp:  [97.8 F (36.6 C)-98.3 F (36.8 C)] 98.3 F (36.8 C) (01/05 0400) Pulse Rate:  [52-70] 56 (01/05 0729) Resp:  [10-23] 22 (01/05 0729) BP: (124-187)/(51-83) 128/55 (01/05 0500) SpO2:  [93 %-100 %] 100 % (01/05 0729) FiO2 (%):  [40 %-60 %] 40 % (01/05 0729)  Intake/Output from previous day: 01/04 0701 - 01/05 0700 In: 2120.7 [P.O.:360; I.V.:1410.3; IV Piggyback:350.4] Out: 2650 [Urine:2650] Intake/Output this shift: No intake/output data recorded.  General appearance: no distress, resting quietly. Resp: BiPAP in place, no accessory muscle use  Lab Results: Recent Labs    07/04/18 0511 07/05/18 0416  WBC 19.3* 19.7*  HGB 9.0* 9.0*  HCT 29.2* 28.8*  PLT 309 292   BMET Recent Labs    07/04/18 0511 07/05/18 0416  NA 138 135  K 3.9 3.6  CL 109 106  CO2 22 23  GLUCOSE 151* 140*  BUN 39* 43*  CREATININE 1.53* 1.60*  CALCIUM 8.2* 8.4*    Studies/Results: Dg Chest 2 View  Result Date: 06/30/2018 CLINICAL DATA:  Wheezing, hypoxia, former smoker. Previous MI. Patient was hospitalized 2 days ago due to a fall. EXAM: CHEST - 2 VIEW COMPARISON:  PA and lateral chest x-ray of June 14, 2017 FINDINGS: Today's study is obtained in a more lordotic fashion. The lungs are well-expanded. The interstitial markings are coarse. There is thickening of the minor fissure. There is patchy increased density at the left lung base. Previously demonstrated density in the right upper lobe is less conspicuous today. The heart and pulmonary vascularity are normal. There is calcification in the wall of the aortic arch. IMPRESSION: Chronic interstitial changes bilaterally. Slightly increased interstitial density at the left lung base may reflect developing pneumonia. Thoracic aortic atherosclerosis. Electronically Signed   By: David  [place] M.D.   On: 06/30/2018 14:32   [place] Ekg Site Rite  Result Date:  07/03/2018 If Site Rite image not attached, placement could not be confirmed due to current cardiac rhythm.  Anti-infectives: Anti-infectives (From admission, onward)   Start     Dose/Rate Route Frequency Ordered Stop   07/04/18 0600  vancomycin (VANCOCIN) IVPB 750 mg/150 ml premix     750 mg 150 mL/hr over 60 Minutes Intravenous Every 24 hours 07/03/18 0813     07/02/18 2200  vancomycin (VANCOCIN) IVPB 1000 mg/200 mL premix  Status:  Discontinued     1,000 mg 200 mL/hr over 60 Minutes Intravenous Every 24 hours 07/02/18 1306 07/03/18 0813   07/02/18 1300  vancomycin (VANCOCIN) IVPB 1000 mg/200 mL premix     1,000 mg 200 mL/hr over 60 Minutes Intravenous  Once 07/02/18 1148 07/02/18 1436   07/02/18 1200  ceFEPIme (MAXIPIME) 2 g in sodium chloride 0.9 % 100 mL IVPB     2 g 200 mL/hr over 30 Minutes Intravenous Every 12 hours 07/02/18 1148     07/02/18 0837  ceFAZolin (ANCEF) IVPB 2g/100 mL premix  Status:  Discontinued     2 g 200 mL/hr over 30 Minutes Intravenous 30 min pre-op 07/02/18 0837 07/02/18 1114   07/01/18 1400  levofloxacin (LEVAQUIN) IVPB 750 mg  Status:  Discontinued     750 mg 100 mL/hr over 90 Minutes Intravenous Every 48 hours 07/01/18 1235 07/02/18 1148      Assessment/Plan: s/p Procedure(s): Lower Extremity Angiography, possible intervention (Left)   Creatinine slightly up, positive RA latex turbid test, no history previously of RA.  At  this point in time, reassess in the morning to see if he can lay flat for prolonged period of time and cooperate as far as remaining immobile.  This would be paramount to success for percutaneous procedure, would not want to intubate this gentleman under his tenuous pulmonary circumstances.  N.p.o. after midnight, reassess in a.m.   LOS: 7 days   Piers Baade A Manasi Dishon 07/05/2018, 9:11 AM

## 2018-07-06 ENCOUNTER — Inpatient Hospital Stay: Payer: Medicare HMO

## 2018-07-06 ENCOUNTER — Encounter
Admission: EM | Disposition: A | Discharge status: Hospice | Payer: Self-pay | Source: Home / Self Care | Attending: Internal Medicine

## 2018-07-06 DIAGNOSIS — I70222 Atherosclerosis of native arteries of extremities with rest pain, left leg: Secondary | ICD-10-CM

## 2018-07-06 DIAGNOSIS — Z515 Encounter for palliative care: Secondary | ICD-10-CM

## 2018-07-06 DIAGNOSIS — Z7189 Other specified counseling: Secondary | ICD-10-CM

## 2018-07-06 DIAGNOSIS — I998 Other disorder of circulatory system: Secondary | ICD-10-CM

## 2018-07-06 DIAGNOSIS — J810 Acute pulmonary edema: Secondary | ICD-10-CM

## 2018-07-06 HISTORY — PX: LOWER EXTREMITY ANGIOGRAPHY: CATH118251

## 2018-07-06 LAB — COMPREHENSIVE METABOLIC PANEL
ALT: 41 U/L (ref 0–44)
AST: 33 U/L (ref 15–41)
Albumin: 2.2 g/dL — ABNORMAL LOW (ref 3.5–5.0)
Alkaline Phosphatase: 54 U/L (ref 38–126)
Anion gap: 6 (ref 5–15)
BILIRUBIN TOTAL: 0.6 mg/dL (ref 0.3–1.2)
BUN: 48 mg/dL — ABNORMAL HIGH (ref 8–23)
CO2: 24 mmol/L (ref 22–32)
Calcium: 8.4 mg/dL — ABNORMAL LOW (ref 8.9–10.3)
Chloride: 110 mmol/L (ref 98–111)
Creatinine, Ser: 1.67 mg/dL — ABNORMAL HIGH (ref 0.61–1.24)
GFR calc Af Amer: 42 mL/min — ABNORMAL LOW (ref >60)
GFR calc non Af Amer: 36 mL/min — ABNORMAL LOW (ref >60)
Glucose, Bld: 154 mg/dL — ABNORMAL HIGH (ref 70–99)
Potassium: 3.4 mmol/L — ABNORMAL LOW (ref 3.5–5.1)
Sodium: 140 mmol/L (ref 135–145)
Total Protein: 5.6 g/dL — ABNORMAL LOW (ref 6.5–8.1)

## 2018-07-06 LAB — CBC
HCT: 28.6 % — ABNORMAL LOW (ref 39.0–52.0)
Hemoglobin: 9 g/dL — ABNORMAL LOW (ref 13.0–17.0)
MCH: 23.6 pg — ABNORMAL LOW (ref 26.0–34.0)
MCHC: 31.5 g/dL (ref 30.0–36.0)
MCV: 74.9 fL — ABNORMAL LOW (ref 80.0–100.0)
Platelets: 277 K/uL (ref 150–400)
RBC: 3.82 MIL/uL — ABNORMAL LOW (ref 4.22–5.81)
RDW: 16.3 % — ABNORMAL HIGH (ref 11.5–15.5)
WBC: 21.5 K/uL — ABNORMAL HIGH (ref 4.0–10.5)
nRBC: 0.1 % (ref 0.0–0.2)

## 2018-07-06 LAB — PHOSPHORUS: Phosphorus: 3.9 mg/dL (ref 2.5–4.6)

## 2018-07-06 LAB — VANCOMYCIN, TROUGH: Vancomycin Tr: 14 ug/mL — ABNORMAL LOW (ref 15–20)

## 2018-07-06 LAB — PROCALCITONIN: Procalcitonin: 0.11 ng/mL

## 2018-07-06 LAB — MAGNESIUM: Magnesium: 2.3 mg/dL (ref 1.7–2.4)

## 2018-07-06 LAB — HEPARIN LEVEL (UNFRACTIONATED): Heparin Unfractionated: 0.35 IU/mL (ref 0.30–0.70)

## 2018-07-06 LAB — CRYOGLOBULIN

## 2018-07-06 SURGERY — LOWER EXTREMITY ANGIOGRAPHY
Anesthesia: Moderate Sedation | Laterality: Left

## 2018-07-06 MED ORDER — ALTEPLASE 2 MG IJ SOLR
INTRAMUSCULAR | Status: AC
  Filled 2018-07-06: qty 4

## 2018-07-06 MED ORDER — HEPARIN (PORCINE) IN NACL 1000-0.9 UT/500ML-% IV SOLN
INTRAVENOUS | Status: AC
  Filled 2018-07-06: qty 1000

## 2018-07-06 MED ORDER — MIDAZOLAM HCL 5 MG/5ML IJ SOLN
INTRAMUSCULAR | Status: AC
  Filled 2018-07-06: qty 5

## 2018-07-06 MED ORDER — MORPHINE SULFATE (PF) 2 MG/ML IV SOLN
INTRAVENOUS | Status: AC
  Filled 2018-07-06: qty 1

## 2018-07-06 MED ORDER — DEXMEDETOMIDINE HCL IN NACL 400 MCG/100ML IV SOLN
0.4000 ug/kg/h | INTRAVENOUS | Status: DC
  Administered 2018-07-06: 0.5 ug/kg/h via INTRAVENOUS

## 2018-07-06 MED ORDER — LIDOCAINE-EPINEPHRINE (PF) 1 %-1:200000 IJ SOLN
INTRAMUSCULAR | Status: AC
  Filled 2018-07-06: qty 10

## 2018-07-06 MED ORDER — MIDAZOLAM HCL 2 MG/2ML IJ SOLN
INTRAMUSCULAR | Status: DC | PRN
  Administered 2018-07-06: 1 mg via INTRAVENOUS

## 2018-07-06 MED ORDER — BLISTEX MEDICATED EX OINT
TOPICAL_OINTMENT | CUTANEOUS | Status: DC | PRN
  Filled 2018-07-06 (×2): qty 6.3

## 2018-07-06 MED ORDER — POTASSIUM CHLORIDE 10 MEQ/100ML IV SOLN
10.0000 meq | INTRAVENOUS | Status: AC
  Administered 2018-07-06 (×2): 10 meq via INTRAVENOUS
  Filled 2018-07-06 (×2): qty 100

## 2018-07-06 MED ORDER — CEFAZOLIN SODIUM-DEXTROSE 1-4 GM/50ML-% IV SOLN
1.0000 g | INTRAVENOUS | Status: AC
  Filled 2018-07-06: qty 50

## 2018-07-06 MED ORDER — METOPROLOL TARTRATE 25 MG PO TABS
25.0000 mg | ORAL_TABLET | Freq: Two times a day (BID) | ORAL | Status: DC
  Administered 2018-07-06 – 2018-07-10 (×8): 25 mg via ORAL
  Filled 2018-07-06 (×8): qty 1

## 2018-07-06 MED ORDER — MIDAZOLAM HCL 2 MG/2ML IJ SOLN
INTRAMUSCULAR | Status: DC | PRN
  Administered 2018-07-06 (×3): 1 mg via INTRAVENOUS

## 2018-07-06 MED ORDER — FENTANYL CITRATE (PF) 100 MCG/2ML IJ SOLN
INTRAMUSCULAR | Status: DC | PRN
  Administered 2018-07-06 (×3): 25 ug via INTRAVENOUS

## 2018-07-06 MED ORDER — FENTANYL CITRATE (PF) 100 MCG/2ML IJ SOLN
INTRAMUSCULAR | Status: DC | PRN
  Administered 2018-07-06: 25 ug via INTRAVENOUS

## 2018-07-06 MED ORDER — FENTANYL CITRATE (PF) 100 MCG/2ML IJ SOLN
INTRAMUSCULAR | Status: AC
  Filled 2018-07-06: qty 2

## 2018-07-06 MED ORDER — HEPARIN SODIUM (PORCINE) 1000 UNIT/ML IJ SOLN
INTRAMUSCULAR | Status: AC
  Filled 2018-07-06: qty 1

## 2018-07-06 MED ORDER — SODIUM CHLORIDE 0.9 % IV SOLN
2.0000 g | INTRAVENOUS | Status: DC
  Filled 2018-07-06: qty 2

## 2018-07-06 MED ORDER — IOPAMIDOL (ISOVUE-300) INJECTION 61%
INTRAVENOUS | Status: DC | PRN
  Administered 2018-07-06: 50 mL via INTRAVENOUS

## 2018-07-06 MED ORDER — ALTEPLASE 2 MG IJ SOLR
INTRAMUSCULAR | Status: DC | PRN
  Administered 2018-07-06: 4 mg

## 2018-07-06 MED ORDER — HEPARIN SODIUM (PORCINE) 1000 UNIT/ML IJ SOLN
INTRAMUSCULAR | Status: DC | PRN
  Administered 2018-07-06: 4000 [IU] via INTRAVENOUS

## 2018-07-06 SURGICAL SUPPLY — 20 items
BALLN LUTONIX 018 5X100X130 (BALLOONS) ×3
BALLN LUTONIX 018 5X220X130 (BALLOONS) ×3
BALLN ULTRVRSE 2.5X300X150 (BALLOONS) ×3
BALLOON LUTONIX 018 5X100X130 (BALLOONS) IMPLANT
BALLOON LUTONIX 018 5X220X130 (BALLOONS) IMPLANT
BALLOON ULTRVRSE 2.5X300X150 (BALLOONS) IMPLANT
CANISTER PENUMBRA ENGINE (MISCELLANEOUS) ×2 IMPLANT
CATH BEACON 5 .038 100 VERT TP (CATHETERS) ×2 IMPLANT
CATH INDIGO CAT6 KIT (CATHETERS) ×2 IMPLANT
CATH PIG 70CM (CATHETERS) ×2 IMPLANT
DEVICE PRESTO INFLATION (MISCELLANEOUS) ×2 IMPLANT
DEVICE STARCLOSE SE CLOSURE (Vascular Products) ×2 IMPLANT
GLIDEWIRE ADV .035X260CM (WIRE) ×2 IMPLANT
PACK ANGIOGRAPHY (CUSTOM PROCEDURE TRAY) ×3 IMPLANT
SHEATH BRITE TIP 5FRX11 (SHEATH) ×2 IMPLANT
SHEATH DESTIN RDC 6FR 45 (SHEATH) ×2 IMPLANT
SYR MEDRAD MARK V 150ML (SYRINGE) ×2 IMPLANT
TUBING CONTRAST HIGH PRESS 72 (TUBING) ×4 IMPLANT
WIRE G V18X300CM (WIRE) ×2 IMPLANT
WIRE J 3MM .035X145CM (WIRE) ×2 IMPLANT

## 2018-07-06 NOTE — Progress Notes (Signed)
[facility] - [place] [facility]   PATIENT NAME: [name] [name]    MR#:  [id number]  DATE OF BIRTH:  [birth year]  SUBJECTIVE:   Patient transition to nasal cannula this morning REVIEW OF SYSTEMS:    Review of Systems  Constitutional: Negative.  Negative for chills, fever and malaise/fatigue.  HENT: Negative.  Negative for ear discharge, ear pain, hearing loss, nosebleeds and sore throat.   Eyes: Negative.  Negative for blurred vision and pain.  Respiratory: Negative.  Negative for cough, hemoptysis, shortness of breath and wheezing.   Cardiovascular: Negative.  Negative for chest pain, palpitations and leg swelling.  Gastrointestinal: Negative.  Negative for abdominal pain, blood in stool, diarrhea, nausea and vomiting.  Genitourinary: Negative.  Negative for dysuria.  Musculoskeletal: Negative.  Negative for back pain.       Foot pain   Skin: Negative.   Neurological: Negative for dizziness, tremors, speech change, focal weakness, seizures and headaches.  Endo/Heme/Allergies: Negative.  Does not bruise/bleed easily.  Psychiatric/Behavioral: Positive for memory loss. Negative for depression, hallucinations and suicidal ideas.     Tolerating Diet: npo      DRUG ALLERGIES:   Allergies  Allergen Reactions  . Aspirin Anaphylaxis, Swelling and Shortness Of Breath    REACTION: terrible bp reaction/wheezing  . Bee Pollen Other (See Comments)    ragweed  . Pollen Extract     ragweed  . Rabbit Epithelium     rabbits    VITALS:  Blood pressure (!) 157/63, pulse 71, temperature 98.1 F (36.7 C), temperature source Oral, resp. rate 19, height 5\' 7"  (1.702 m), weight 68.8 kg, SpO2 96 %.  PHYSICAL EXAMINATION:  Constitutional: Appears frail and critically ill-appearing no distress. HENT: Normocephalic. Marland Kitchen. BiPAP  eyes: Conjunctivae and EOM are normal. PERRLA, no scleral icterus.  Neck: Normal ROM. Neck supple. No JVD. No tracheal deviation. CVS:  Tachycardia regular rate and rhythm S1/S2 +, 2/6 murmurs, no gallops, no carotid bruit.  Pulmonary: bilateral wheezing/rhonchi Abdominal: Soft. BS +,  no distension, tenderness, rebound or guarding.  Musculoskeletal: Normal range of motion. No edema and no tenderness.  Neuro: Alert. . No focal deficits. Skin: Skin is warm and dry.  Diffuse maculopapular rash lower extremity Pulses not palpable left distal foot which is cool to touch    LABORATORY PANEL:   CBC Recent Labs  Lab 07/06/18 0543  WBC 21.5*  HGB 9.0*  HCT 28.6*  PLT 277   ------------------------------------------------------------------------------------------------------------------  Chemistries  Recent Labs  Lab 07/06/18 0543  NA 140  K 3.4*  CL 110  CO2 24  GLUCOSE 154*  BUN 48*  CREATININE 1.67*  CALCIUM 8.4*  MG 2.3  AST 33  ALT 41  ALKPHOS 54  BILITOT 0.6   ------------------------------------------------------------------------------------------------------------------  Cardiac Enzymes No results for input(s): TROPONINI in the last 168 hours. ------------------------------------------------------------------------------------------------------------------  RADIOLOGY:  Dg Chest Port 1 View  Result Date: 07/06/2018 CLINICAL DATA:  85 year old male with pulmonary edema. Subsequent encounter. EXAM: PORTABLE CHEST 1 VIEW COMPARISON:  06/30/2018, 06/14/2017, 10/08/2016 and 06/28/2015 chest x-ray. FINDINGS: Right PICC line tip proximal superior vena cava level. Heart size within normal limits. Calcified aorta. Interval decrease in degree of pulmonary edema. Possibly loculated small pleural effusion versus atelectasis left lung base. Left base infiltrate less likely consideration. Small left-sided pleural effusion. Persistent scarring right mid lung. Small amount of fissural fluid may be present. IMPRESSION: 1. Interval decrease in degree of pulmonary edema. 2. Possibly loculated pleural effusion versus  atelectasis left lung  base. Infiltrate less likely consideration. 3. Persistent scarring right mid lung. Small amount of fissural fluid may be present. Small right-sided pleural effusion. 4. Right PICC line tip proximal superior vena cava level. 5.  Aortic Atherosclerosis (ICD10-I70.0). Electronically Signed   By: Lacy Duverney M.D.   On: 07/06/2018 07:11     ASSESSMENT AND PLAN:   85 year old male with a history of PAD CAD, PAF and chronic kidney disease stage III who presented for lower extremity angiogram and found to have acute respiratory distress and atrial fibrillation.  1.  Acute hypoxic respiratory failure in the setting of atrial fibrillation with RVR and pneumonia: Patient weaned to [place] from bipap Wean steroids as tolerated.  He has been treated for pneumonia and off of antibiotics.  2.  Atrial fibrillation with RVR, normal sinus rhythm: Cardiology consultation appreciated Continue diltiazem and amiodarone Continue heparin drip  3.  PAD with leg claudication: Patient's respiratory status is very tenuous Management as per vascular surgery Continue Plavix Plan for possible angiogram today 4.  Chronic kidney disease stage III with stable creatinine 5.  Hypokalemia: Replete PRN  6.  Hypothyroidism with decreased TSH: T4 is normal  7.  Dementia: Continue donepezil    CODE STATUS: DNR  TOTAL TIME TAKING CARE OF THIS PATIENT: 22 minutes.     POSSIBLE D/C ??, DEPENDING ON CLINICAL CONDITION.   Jemila Camille M.D on 07/06/2018 at 12:16 PM  Between 7am to 6pm - Pager - [phone] After 6pm go to [email or web address] - password [email or web address]  [facility]  Office  [phone]  CC: Primary care physician; Kerman Passey, MD  Note: This dictation was prepared with Dragon dictation along with smaller phrase technology. Any transcriptional errors that result from this process are unintentional.

## 2018-07-06 NOTE — Consult Note (Signed)
Consultation Note Date: 07/06/2018   Patient Name: [name] [name]  DOB: 16-May-1928  MRN: [id number]  Age / Sex: 85 y.o., male  PCP: Arnetha Courser, MD Referring Physician: Bettey Costa, MD  Reason for Consultation: Establishing goals of care  HPI/Patient Profile: 85 y.o. 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 Hospitalization complicated by rapid atrial fibrillation and severe dyspnea requiring BiPAP and ICU stay. Has weaned off BiPAP 07/06/18 with plans for potential angiogram today.   Clinical Assessment and Goals of Care: I met today with [name] [name]" but conversation is slightly difficult as he is very hard of hearing. He also appears to fatigue very much with conversation. He understands he is ill but all he can say is "it's bad" when discussing his leg and breathing.   I spoke with [name] [name] daughter, Judeen Hammans, via telephone. Judeen Hammans shares that she is her parents main caretaker and her mother has dementia. There are 4 children in total [place], 1 daughter in [place], 1 daughter in [place], and 1 son in [place]). Son is currently en-route to come visit. Judeen Hammans currently has the flu herself and unable to be present. 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 She is aware he is in need of angiogram and awaiting vascular report for what is next for his LLE. I did explain that his breathing is still labored and that vascular may decide to wait on procedure while breathing is still poor but they will let family know the plan. Judeen Hammans mentions amputation and "if they have  to take his leg then they have to take it." I explained that this would be a major surgery for her father and could greatly impact his QOL. I explained that much thought and discussion should be had prior to any surgery. All questions/concerns addressed. Emotional support provided.   I was hoping to meet with son at bedside to further discuss but he is yet to arrive. Daughter invested in continued work up and angiogram. I will continue my conversations with family regarding aggressiveness of care desired and expectations. Conversations limited by patient hard of hearing, wife with dementia, and local adult child acutely ill with flu. I will continue efforts tomorrow.   Primary Decision Maker Patient and wife poor historians at times. If patient and his wife unable to make his own decisions his reasonably available 4 children would make decisions on his behalf.    SUMMARY OF RECOMMENDATIONS   - Will need to continue [place] conversations  Code Status/Advance Care Planning:  DNR - confirmed by daughter Judeen Hammans   Symptom Management:   For  angiogram to better evaluate severity of left leg claudication.   Palliative Prophylaxis:   Aspiration, Bowel Regimen, Delirium Protocol, Frequent Pain Assessment and Turn Reposition  Psycho-social/Spiritual:   Desire for further Chaplaincy support:yes  Additional Recommendations: Caregiving  Support/Resources and Education on Hospice  Prognosis:   Overall prognosis very poor with worsening functional status and complex health conditions with advanced age.   Discharge Planning: To Be Determined      Primary Diagnoses: Present on Admission: . PVD (peripheral vascular disease) ([place])   I have reviewed the medical record, interviewed the patient and family, and examined the patient. The following aspects are pertinent.  Past Medical History:  Diagnosis Date  . Arthritis   . CKD (chronic kidney disease) stage 3, GFR 30-59 ml/min (HCC) 12/29/2015    . Dry gangrene ([place]) 06/16/2017   Great toe and 5th toe LEFT foot; Dr. Lucky Cowboy  . Hyperlipidemia   . Hypertension   . Left leg DVT ([place]) 01/2014  . Myocardial infarction ([place])   . Pulmonary embolism ([place]) 01/2014  . Thoracic aorta atherosclerosis ([place]) 10/08/2016   Social History   Socioeconomic History  . Marital status: Married    Spouse name: Not on file  . Number of children: Not on file  . Years of education: Not on file  . Highest education level: Not on file  Occupational History    Employer: RETIRED  Social Needs  . Financial resource strain: Not on file  . Food insecurity:    Worry: Not on file    Inability: Not on file  . Transportation needs:    Medical: Not on file    Non-medical: Not on file  Tobacco Use  . Smoking status: Former Smoker    Types: Pipe    Last attempt to quit: 1970    Years since quitting: 50.0  . Smokeless tobacco: Never Used  Substance and Sexual Activity  . Alcohol use: No    Alcohol/week: 0.0 standard drinks  . Drug use: No  . Sexual activity: Not Currently  Lifestyle  . Physical activity:    Days per week: Not on file    Minutes per session: Not on file  . Stress: Not on file  Relationships  . Social connections:    Talks on phone: Not on file    Gets together: Not on file    Attends religious service: Not on file    Active member of club or organization: Not on file    Attends meetings of clubs or organizations: Not on file    Relationship status: Not on file  Other Topics Concern  . Not on file  Social History Narrative  . Not on file   Family History  Problem Relation Age of Onset  . Arthritis Mother   . Arthritis Father   . Depression Father   . Hypertension Daughter   . Pulmonary Hypertension Daughter    Scheduled Meds: . amiodarone  400 mg Oral Daily  . budesonide (PULMICORT) nebulizer solution  0.5 mg Nebulization BID  . cholecalciferol  5,000 Units Oral Daily  . clopidogrel  75 mg Oral Daily  . donepezil  5 mg  Oral QPM  . furosemide  40 mg Intravenous Once  . ipratropium-albuterol  3 mL Nebulization Q4H  . methylPREDNISolone (SOLU-MEDROL) injection  60 mg Intravenous Q6H  . metoprolol tartrate  25 mg Oral BID  . omega-3 acid ethyl esters  1 g Oral Daily  . senna-docusate  1 tablet Oral BID  .  sodium chloride flush  10-40 mL Intracatheter Q12H  . sodium chloride flush  3 mL Intravenous Q12H  . triamcinolone cream  1 application Topical TID  . vitamin B-12  1,000 mcg Oral Daily   Continuous Infusions: . sodium chloride    . albuterol 10 mg/hr (07/06/18 0801)  . diltiazem (CARDIZEM) infusion Stopped (07/03/18 1244)  . heparin 1,000 Units/hr (07/06/18 0200)  . potassium chloride     PRN Meds:.sodium chloride, acetaminophen **OR** acetaminophen, guaiFENesin, hydrALAZINE, levalbuterol, lip balm, loratadine, morphine injection, ondansetron **OR** ondansetron (ZOFRAN) IV, oxyCODONE, sodium chloride flush, sodium chloride flush Allergies  Allergen Reactions  . Aspirin Anaphylaxis, Swelling and Shortness Of Breath    REACTION: terrible bp reaction/wheezing  . Bee Pollen Other (See Comments)    ragweed  . Pollen Extract     ragweed  . Rabbit Epithelium     rabbits   Review of Systems  Constitutional: Positive for activity change.  Respiratory: Positive for shortness of breath.   Skin: Positive for color change and rash.       LLE    Physical Exam Vitals signs and nursing note reviewed.  Constitutional:      General: He is not in acute distress.    Appearance: He is ill-appearing.  Cardiovascular:     Rate and Rhythm: Normal rate. Rhythm irregularly irregular.  Pulmonary:     Effort: No tachypnea, accessory muscle usage or respiratory distress.     Breath sounds: Rhonchi present.     Comments: Labored breathing and fatigue with conversation Abdominal:     Palpations: Abdomen is soft.  Musculoskeletal:     Comments: Left dorsal pedis absent and left foot cool to touch. Right foot  warm with normal palpated pulse.   Neurological:     Mental Status: He is alert.     Comments: Orientation to situation questionable      Vital Signs: BP (!) 157/63 (BP Location: Right Arm)   Pulse 65   Temp 98.1 F (36.7 C) (Oral)   Resp (!) 25   Ht '5\' 7"'$  (1.702 m)   Wt 68.8 kg   SpO2 95%   BMI 23.76 kg/m  Pain Scale: 0-10 POSS *See Group Information*: 1-Acceptable,Awake and alert Pain Score: 0-No pain   SpO2: SpO2: 95 % O2 Device:SpO2: 95 % O2 Flow Rate: .O2 Flow Rate (L/min): 3 L/min  IO: Intake/output summary:   Intake/Output Summary (Last 24 hours) at 07/06/2018 1252 Last data filed at 07/06/2018 0600 Gross per 24 hour  Intake 571.62 ml  Output 925 ml  Net -353.38 ml    LBM: Last BM Date: 07/02/18 Baseline Weight: Weight: 68.9 kg Most recent weight: Weight: 68.8 kg     Palliative Assessment/Data: 30%   Flowsheet Rows     Most Recent Value  Intake Tab  Referral Department  Hospitalist  Unit at Time of Referral  ICU  Palliative Care Primary Diagnosis  Cardiac  Date Notified  07/04/18  Palliative Care Type  New Palliative care  Reason for referral  Clarify Goals of Care  Date of Admission  06/28/18  # of days IP prior to Palliative referral  6  Clinical Assessment  Psychosocial & Spiritual Assessment  Palliative Care Outcomes      Time In: 1230 Time Out: 1330 Time Total: 60 min Greater than 50%  of this time was spent counseling and coordinating care related to the above assessment and plan.  Signed by: Vinie Sill, NP Palliative Medicine Team Pager # [phone] (M-F  8a-5p) Team Phone # [phone] (Nights/Weekends)

## 2018-07-06 NOTE — Progress Notes (Signed)
ANTICOAGULATION CONSULT NOTE  Pharmacy Consult for Heparin  Indication: arterial occlusion  Patient Measurements: Height: 5\' 7"  (170.2 cm) Weight: 151 lb 10.8 oz (68.8 kg) IBW/kg (Calculated) : 66.1 Heparin Dosing Weight:  68.9 kg   Vital Signs: Temp: 98.1 F (36.7 C) (01/06 0400) Temp Source: Oral (01/06 0400) BP: 157/63 (01/06 0400) Pulse Rate: 69 (01/06 0400)  Labs: Recent Labs    07/03/18 1318  07/04/18 0511  07/05/18 0416 07/05/18 0736 07/05/18 1636 07/06/18 0543  HGB  --    < > 9.0*  --  9.0*  --   --  9.0*  HCT  --   --  29.2*  --  28.8*  --   --  28.6*  PLT  --   --  309  --  292  --   --  277  HEPARINUNFRC  --   --  0.72*   < >  --  0.53 0.59 0.35  CREATININE  --   --  1.53*  --  1.60*  --   --  1.67*  CKTOTAL 686*  --   --   --  368  --   --   --    < > = values in this interval not displayed.    Estimated Creatinine Clearance: 28.6 mL/min (A) (by C-G formula based on SCr of 1.67 mg/dL (H)).   Medical History: Past Medical History:  Diagnosis Date  . Arthritis   . CKD (chronic kidney disease) stage 3, GFR 30-59 ml/min (HCC) 12/29/2015  . Dry gangrene (HCC) 06/16/2017   Great toe and 5th toe LEFT foot; Dr. Wyn Quaker  . Hyperlipidemia   . Hypertension   . Left leg DVT (HCC) 01/2014  . Myocardial infarction (HCC)   . Pulmonary embolism (HCC) 01/2014  . Thoracic aorta atherosclerosis (HCC) 10/08/2016    Medications:  Medications Prior to Admission  Medication Sig Dispense Refill Last Dose  . atenolol (TENORMIN) 50 MG tablet TAKE 1 TABLET (50 MG TOTAL) BY MOUTH DAILY. 90 tablet 1 06/27/2018 at 0800  . atorvastatin (LIPITOR) 10 MG tablet TAKE 1 TABLET BY MOUTH EVERYDAY AT BEDTIME 90 tablet 1 06/27/2018 at 2000  . Cholecalciferol (VITAMIN D-1000 MAX ST) 1000 units tablet Take 5 tablets by mouth daily.   06/27/2018 at 0800  . clopidogrel (PLAVIX) 75 MG tablet Take 1 tablet (75 mg total) by mouth daily. Use INSTEAD of Eliquis 30 tablet 0 06/28/2018 at 0800  .  donepezil (ARICEPT) 5 MG tablet Take 1 tablet by mouth every evening.  6 06/27/2018 at 2000  . loratadine (CLARITIN) 10 MG tablet Take 1 tablet (10 mg total) by mouth daily as needed for allergies.   06/27/2018 at 0800  . Omega-3 Fatty Acids (FISH OIL PO) Take 1 capsule by mouth 2 (two) times daily.   06/27/2018 at 0800  . triamcinolone lotion (KENALOG) 0.1 % Apply 1 application topically 3 (three) times daily. 120 mL 1 06/27/2018 at 0800  . vitamin B-12 (CYANOCOBALAMIN) 1000 MCG tablet Take 1 tablet by mouth daily.   06/27/2018 at 0800  . albuterol (PROVENTIL HFA;VENTOLIN HFA) 108 (90 Base) MCG/ACT inhaler Inhale 2 puffs into the lungs every 4 (four) hours as needed for wheezing or shortness of breath. (Patient not taking: Reported on 06/28/2018) 1 Inhaler 1 Not Taking at Unknown time    Assessment: Pharmacy consulted to dose heparin in this 85 year old male with occluded artery. Heparin rate decreased around 2300 on 1/4 to 1000 units/hr.   01/05  Anti-Xa level therapeutic x 2. Heparin infusion continued at 1000 units/hr.   Goal of Therapy:  Heparin level 0.3-0.7 units/ml Monitor platelets by anticoagulation protocol: Yes   Plan:  01/06 AM: HL 0.35. Level remains therapeutic.   Will continue heparin infusion at 1000 units/hr.   Pharmacy will continue to monitor heparin level and CBC daily per protocol and adjust per consult.   Gardner Candle, PharmD, BCPS Clinical Pharmacist 07/06/2018 7:25 AM

## 2018-07-06 NOTE — Op Note (Signed)
[facility]  Percutaneous Study/Intervention Procedural Note   Date of Surgery: 07/06/2018  Surgeon(s):,    Assistants:none  Pre-operative Diagnosis: PAD with rest pain left foot  Post-operative diagnosis:  Same  Procedure(s) Performed:             1.  Ultrasound guidance for vascular access right femoral artery             2.  Catheter placement into left SFA from right femoral approach             3.  Aortogram and selective left lower extremity angiogram             4.   Catheter directed thrombolytic therapy with 4 mg of TPA delivered left SFA and popliteal arteries             5.   Mechanical thrombectomy to the left SFA, popliteal, and anterior tibial arteries with the penumbra cat 6 device  6.  Percutaneous transluminal angioplasty of the left anterior tibial artery from the ankle all the way up to the origin of the anterior tibial artery with a 2.5 mm diameter by 30 cm length angioplasty balloon  7.  Percutaneous transluminal angioplasty of the left SFA and popliteal arteries with 5 mm diameter by 22 cm length Lutonix drug-coated angioplasty balloon and a 5 mm diameter by 10 cm length Lutonix drug-coated angioplasty balloon             8.  StarClose closure device right femoral artery  EBL: 175 cc  Contrast: 75 cc  Fluoro Time: 19.2 minutes  Moderate Conscious Sedation Time: approximately 50 minutes using 3 mg of Versed and 75 Mcg of Fentanyl              Indications:  Patient is a 85 y.o.male with rest pain of the left foot with no distal pulses.  He has a previous history of intervention but multiple other medical issues have been ongoing which have delayed his angiogram for many days. The patient is brought in for angiography for further evaluation and potential treatment.  Due to the limb threatening nature of the situation, angiogram was performed for attempted limb salvage. The patient is aware that if the procedure fails, amputation would  be expected.  The patient also understands that even with successful revascularization, amputation may still be required due to the severity of the situation.  Risks and benefits are discussed and informed consent is obtained.   Procedure:  The patient was identified and appropriate procedural time out was performed.  The patient was then placed supine on the table and prepped and draped in the usual sterile fashion. Moderate conscious sedation was administered during a face to face encounter with the patient throughout the procedure with my supervision of the RN administering medicines and monitoring the patient's vital signs, pulse oximetry, telemetry and mental status throughout from the start of the procedure until the patient was taken to the recovery room. Ultrasound was used to evaluate the right common femoral artery.  It was patent .  A digital ultrasound image was acquired.  A Seldinger needle was used to access the right common femoral artery under direct ultrasound guidance and a permanent image was performed.  A 0.035 J wire was advanced without resistance and a 5Fr sheath was placed.  Pigtail catheter was placed into the aorta and an AP aortogram was performed. This demonstrated normal renal arteries and normal aorta and iliac segments without significant  stenosis. I then crossed the aortic bifurcation and advanced to the left femoral head.  I then advanced the pigtail catheter down into the left proximal SFA because distal opacification was poor. Selective left lower extremity angiogram was then performed. This demonstrated normal common femoral artery and profunda femoris artery.  The proximal superficial femoral artery is patent.  The SFA occludes about 4 to 5 cm above the previously placed stent abruptly and essentially no distal runoff is seen with occlusion of all 3 tibial vessels, the remainder of the SFA and popliteal arteries.  Although this was a grave situation with a high risk of limb  loss, it was felt that it was in the patient's best interest to proceed with intervention after these images to avoid a second procedure and a larger amount of contrast and fluoroscopy based off of the findings from the initial angiogram. The patient was systemically heparinized and a 6 [place] Ansell sheath was then placed over the [employer] wire. I then used a Kumpe catheter and the advantage wire to navigate through the occlusion.  The wire tract only into the anterior tibial artery.  The Kumpe catheter was taken down to the anterior tibial artery and this was seen to have a long segment occlusion as well.  This was a very dire situation with no other runoff seen I then advanced a V 18 wire all the way down into the foot and the anterior tibial artery.  4 mg of TPA was then deployed in the left SFA and popliteal artery with the Kumpe catheter.  After this was allowed to dwell, mechanical thrombectomy was then performed with the penumbra cat 6 catheter in the left SFA and popliteal arteries as well as the proximal anterior tibial artery.  Imaging showed no improvement.  I then performed angioplasty.  A 2.5 mm diameter by 30 cm length angioplasty balloon was inflated from the dorsalis pedis artery in the proximal foot all the way up to the origin of the anterior tibial artery.  This was taken to 14 atm for 1 minute.  A pair of Lutonix drug-coated angioplasty balloons were used to treat the SFA and popliteal arteries.  These were 5 mm in diameter and the first was 22 cm and the second was 10 cm.  Both were inflated to about 10 atm for a minute.  Completion imaging showed continued thrombosis of the stent and essentially no distal runoff.  I made another pass with the penumbra cat 6 device to encompass the left SFA, popliteal artery, and all the way down throughout the proximal and mid anterior tibial artery.  There was still really no improvement.  Given the patient's advanced age, cardiopulmonary status which  is very poor, I did not feel he was a candidate for TPA at this point.  I also did not think it was likely to work.  He had become very restless and her imaging quality was becoming much more difficult as well.  He had to be switched over to BiPAP for hypoxia.  At this point, I felt we really had no other options and we will have to consider an amputation if he does not choose palliative care. I elected to terminate the procedure. The sheath was removed and StarClose closure device was deployed in the right femoral artery with excellent hemostatic result. The patient was taken to the recovery room in stable condition having tolerated the procedure well.  Findings:  Aortogram:  Renal arteries not particularly well seen due to catheter position but no obvious stenosis was identified.  Aorta and iliac arteries are patent without significant stenosis.             Left lower Extremity:  Normal common femoral artery and profunda femoris artery.  The proximal superficial femoral artery is patent.  The SFA occludes about 4 to 5 cm above the previously placed stent abruptly and essentially no distal runoff is seen with occlusion of all 3 tibial vessels, the remainder of the SFA and popliteal arteries.   Disposition: Patient was taken to the recovery room in stable condition having tolerated the procedure well.  Complications: None  Leotis Pain 07/06/2018 4:43 PM   This note was created with Dragon Medical transcription system. Any errors in dictation are purely unintentional.

## 2018-07-06 NOTE — Progress Notes (Signed)
Initial Nutrition Assessment  DOCUMENTATION CODES:   Non-severe (moderate) malnutrition in context of social or environmental circumstances  INTERVENTION:  When diet able to be advanced recommend liberalizing to a regular diet.  Once diet advanced provide Ensure Enlive po TID, each supplement provides 350 kcal and 20 grams of protein.  If intake does not improve by the end of the week will need to consider placement of NGT/Dobbhoff tube for short-term enteral nutrition support if this aligns with goals of care.  NUTRITION DIAGNOSIS:   Moderate Malnutrition related to social / environmental circumstances(dementia, inadequate oral intake) as evidenced by moderate fat depletion, mild-moderate muscle depletion.  GOAL:   Patient will meet greater than or equal to 90% of their needs  MONITOR:   Diet advancement, PO intake, Supplement acceptance, Labs, Weight trends, I & O's  REASON FOR ASSESSMENT:   Consult Assessment of nutrition requirement/status  ASSESSMENT:   85 year old male with PMHx of dementia, arthritis, HLD, HTN, hx MI s/p stent placement 2006, hx pulmonary embolism, hx LLE DVT, CKD stage III, thoracic aorta atherosclerosis, dry gangrene to great toe and 5th toe of left foot who is admitted with acute hypoxic respiratory failure, PNA, A-fib RVR, PAD with leg claudication.   -Plan is for angiogram once respiratory status improves and patient can lie flat.  Met with patient at bedside. He is HOH and a poor historian. He is only able to report that his appetite is "so-so" and he has not been eating that much during admission. It appears that up until 1/4 he was finishing about 60-75% of his meals. After that is when intake decreased. On 1/5 he finished 0% of his meals. He is on nasal cannula now, but is remaining NPO for possible angiogram. Patient is amenable to drinking Ensure when diet advanced back. Last BM was only a smear on 1/2.  Patient is unsure of his weight trend.  Per chart he has been weight-stable the past year. He is currently 68.8 kg (151.68 lbs).  Medications reviewed and include: amiodarone, Solu-Medrol 60 mg Q6hrs IV, Lovaza, senna-docusate 1 tablet BID, vitamin B12 1000 micrograms daily, heparin gtt, potassium chloride 10 mEq IV twice today.  Labs reviewed: CBG 129, Potassium 3.4, BUN 48, Creatinine 1.67.  Discussed with RN and on rounds.  NUTRITION - FOCUSED PHYSICAL EXAM:    Most Recent Value  Orbital Region  Moderate depletion  Upper Arm Region  Moderate depletion  Thoracic and Lumbar Region  Mild depletion  Buccal Region  Moderate depletion  Temple Region  Moderate depletion  Clavicle Bone Region  Mild depletion  Clavicle and Acromion Bone Region  Moderate depletion  Scapular Bone Region  Mild depletion  Dorsal Hand  Mild depletion  Patellar Region  Moderate depletion  Anterior Thigh Region  Moderate depletion  Posterior Calf Region  Moderate depletion  Edema (RD Assessment)  Mild  Hair  Reviewed  Eyes  Reviewed  Mouth  Reviewed  Skin  Reviewed  Nails  Reviewed     Diet Order:   Diet Order            Diet NPO time specified  Diet effective 0500 tomorrow             EDUCATION NEEDS:   Not appropriate for education at this time  Skin:  Skin Assessment: Reviewed RN Assessment  Last BM:  07/02/2018 - smear type 4  Height:   Ht Readings from Last 1 Encounters:  07/02/18 5' 7" (1.702 m)  Weight:   Wt Readings from Last 1 Encounters:  07/02/18 68.8 kg   Ideal Body Weight:  67.3 kg  BMI:  Body mass index is 23.76 kg/m.  Estimated Nutritional Needs:   Kcal:  1585-1850 (MSJ x 1.2-1.4)  Protein:  80-95 grams (1.2-1.4 grams/kg)  Fluid:  1.5-1.8 L/day (1 mL/kcal)  Willey Blade, MS, RD, LDN Office: [phone] Pager: [phone] After Hours/Weekend Pager: [phone]

## 2018-07-06 NOTE — H&P (Signed)
[facility] History & Physical Update  The patient was interviewed and re-examined.  [name] previous History and Physical has been reviewed and is unchanged.  There is no change in the plan of care. We plan to proceed with the scheduled procedure.  Festus Barren, MD  07/06/2018, 3:17 PM

## 2018-07-06 NOTE — Progress Notes (Signed)
Follow up - Critical Care Medicine Note  Patient Details:    [name] [name] is an 85 y.o. male.with a past medical history remarkable for hypertension, hyperlipidemia, coronary artery disease, thromboembolic disease, severe peripheral arterial disease, chronic renal failure presented with left-sided leg pain beginning 2 days prior to presentation.  This was associated with claudication with eventual inability to ambulate.  Associated complaints of shortness of breath, wheezing.  Was going to have angiography upon arrival was noted to be in rapid atrial fibrillation with uncontrolled ventricular response along with severe dyspnea.  Patient was transferred to the intensive care unit for further management.   Lines, Airways, Drains:    Anti-infectives:  Anti-infectives (From admission, onward)   Start     Dose/Rate Route Frequency Ordered Stop   07/06/18 2128  ceFEPIme (MAXIPIME) 2 g in sodium chloride 0.9 % 100 mL IVPB  Status:  Discontinued     2 g 200 mL/hr over 30 Minutes Intravenous Every 24 hours 07/06/18 0731 07/06/18 1026   07/06/18 1430  [MAR Hold]  ceFAZolin (ANCEF) IVPB 1 g/50 mL premix     (MAR Hold since Mon 07/06/2018 at 1545. Reason: Transfer to a Procedural area.)  Note to Pharmacy:  Send with pt to OR   1 g 100 mL/hr over 30 Minutes Intravenous On call 07/06/18 1415 07/07/18 1430   07/04/18 0600  vancomycin (VANCOCIN) IVPB 750 mg/150 ml premix  Status:  Discontinued     750 mg 150 mL/hr over 60 Minutes Intravenous Every 24 hours 07/03/18 0813 07/06/18 1026   07/02/18 2200  vancomycin (VANCOCIN) IVPB 1000 mg/200 mL premix  Status:  Discontinued     1,000 mg 200 mL/hr over 60 Minutes Intravenous Every 24 hours 07/02/18 1306 07/03/18 0813   07/02/18 1300  vancomycin (VANCOCIN) IVPB 1000 mg/200 mL premix     1,000 mg 200 mL/hr over 60 Minutes Intravenous  Once 07/02/18 1148 07/02/18 1436   07/02/18 1200  ceFEPIme (MAXIPIME) 2 g in sodium chloride 0.9 % 100 mL IVPB  Status:   Discontinued     2 g 200 mL/hr over 30 Minutes Intravenous Every 12 hours 07/02/18 1148 07/06/18 0731   07/02/18 0837  ceFAZolin (ANCEF) IVPB 2g/100 mL premix  Status:  Discontinued     2 g 200 mL/hr over 30 Minutes Intravenous 30 min pre-op 07/02/18 0837 07/02/18 1114   07/01/18 1400  levofloxacin (LEVAQUIN) IVPB 750 mg  Status:  Discontinued     750 mg 100 mL/hr over 90 Minutes Intravenous Every 48 hours 07/01/18 1235 07/02/18 1148      Microbiology: Results for orders placed or performed during the hospital encounter of 06/28/18  Blood culture (routine x 2)     Status: Abnormal   Collection Time: 06/28/18  4:35 PM  Result Value Ref Range Status   Specimen Description   Final    BLOOD BLOOD RIGHT WRIST Performed at [facility], [address], [place], [place] [address]    Special Requests   Final    BOTTLES DRAWN AEROBIC AND ANAEROBIC Blood Culture adequate volume Performed at [facility], [address] [place], [place] [address]    Culture  Setup Time (A)  Final    GRAM VARIABLE ROD AEROBIC BOTTLE ONLY CRITICAL RESULT CALLED TO, READ BACK BY AND VERIFIED WITHJeralene Peters [facility] AT 6256 06/29/18 SDR Performed at [facility], [address] [place], [place] [address]    Culture (A)  Final    BACILLUS SPECIES  Standardized susceptibility testing for this organism is not available. Performed at [facility], [place] [address], [place], [place] [address]    Report Status 07/01/2018 FINAL  Final  Blood culture (routine x 2)     Status: None   Collection Time: 06/28/18  4:51 PM  Result Value Ref Range Status   Specimen Description BLOOD RIGHT ANTECUBITAL  Final   Special Requests   Final    BOTTLES DRAWN AEROBIC AND ANAEROBIC Blood Culture adequate volume   Culture   Final    NO GROWTH 6 DAYS Performed at [facility], [address] [place], [place] [address]    Report Status 07/04/2018 FINAL  Final  MRSA PCR Screening      Status: None   Collection Time: 07/02/18 11:17 AM  Result Value Ref Range Status   MRSA by PCR NEGATIVE NEGATIVE Final    Comment:        The GeneXpert MRSA Assay (FDA approved for NASAL specimens only), is one component of a comprehensive MRSA colonization surveillance program. It is not intended to diagnose MRSA infection nor to guide or monitor treatment for MRSA infections. Performed at [facility], [address], [place], [place] [address]    Studies: Dg Chest 2 View  Result Date: 06/30/2018 CLINICAL DATA:  Wheezing, hypoxia, former smoker. Previous MI. Patient was hospitalized 2 days ago due to a fall. EXAM: CHEST - 2 VIEW COMPARISON:  PA and lateral chest x-ray of June 14, 2017 FINDINGS: Today's study is obtained in a more lordotic fashion. The lungs are well-expanded. The interstitial markings are coarse. There is thickening of the minor fissure. There is patchy increased density at the left lung base. Previously demonstrated density in the right upper lobe is less conspicuous today. The heart and pulmonary vascularity are normal. There is calcification in the wall of the aortic arch. IMPRESSION: Chronic interstitial changes bilaterally. Slightly increased interstitial density at the left lung base may reflect developing pneumonia. Thoracic aortic atherosclerosis. Electronically Signed   By: David  Martinique M.D.   On: 06/30/2018 14:32   Dg Chest Port 1 View  Result Date: 07/06/2018 CLINICAL DATA:  85 year old male with pulmonary edema. Subsequent encounter. EXAM: PORTABLE CHEST 1 VIEW COMPARISON:  06/30/2018, 06/14/2017, 10/08/2016 and 06/28/2015 chest x-ray. FINDINGS: Right PICC line tip proximal superior vena cava level. Heart size within normal limits. Calcified aorta. Interval decrease in degree of pulmonary edema. Possibly loculated small pleural effusion versus atelectasis left lung base. Left base infiltrate less likely consideration. Small left-sided pleural  effusion. Persistent scarring right mid lung. Small amount of fissural fluid may be present. IMPRESSION: 1. Interval decrease in degree of pulmonary edema. 2. Possibly loculated pleural effusion versus atelectasis left lung base. Infiltrate less likely consideration. 3. Persistent scarring right mid lung. Small amount of fissural fluid may be present. Small right-sided pleural effusion. 4. Right PICC line tip proximal superior vena cava level. 5.  Aortic Atherosclerosis (ICD10-I70.0). Electronically Signed   By: Genia Del M.D.   On: 07/06/2018 07:11   [place] Ekg Site Rite  Result Date: 07/03/2018 If Site Rite image not attached, placement could not be confirmed due to current cardiac rhythm.   Consults: Treatment Team:  Evaristo Bury, MD Hermelinda Dellen, DO Minna Merritts, MD   Subjective:    Overnight Issues: Patient was diuresed with Lasix and now doing better.  Taken off of BiPAP and tolerating.  To have angiogram today.  On amiodarone, converted to normal sinus  rhythm.  Resting comfortably this morning voices no complaint.  Objective:  Vital signs for last 24 hours: Temp:  [97.7 F (36.5 C)-98.2 F (36.8 C)] 97.7 F (36.5 C) (01/06 1509) Pulse Rate:  [60-77] 73 (01/06 1509) Resp:  [13-32] 23 (01/06 1509) BP: (137-178)/(48-78) 173/67 (01/06 1509) SpO2:  [89 %-98 %] 97 % (01/06 1509) FiO2 (%):  [30 %-40 %] 40 % (01/06 1646)  Hemodynamic parameters for last 24 hours:    Intake/Output from previous day: 01/05 0701 - 01/06 0700 In: 574.6 [I.V.:240.8; IV Piggyback:333.9] Out: 995 [Urine:995]  Intake/Output this shift: Total I/O In: 310.8 [I.V.:110.8; IV Piggyback:200] Out: 300 [Urine:300]  Vent settings for last 24 hours: FiO2 (%):  [30 %-40 %] 40 %  Physical Exam:  HEENT:           Presently off of BiPAP, feels his breathing improved from yesterday.  Trachea midline, no thyromegaly noted. Cardiovascular: Presently in sinus rhythm, on amiodarone  Pulmonary:   No  expiratory wheezing this morning Abdominal:  Positive bowel sounds, soft exam Extremities: Unable to palpate pulses in the distal left foot, cool to touch, rash has somewhat improved.  Now more scaling much like the right lower extremity scaling.  More consistent with stasis/low flow changes.  Pending angiogram today per Dr.Dew, vascular surgery  Neurologic:Patient moves all extremities, cranial nerves are grossly intact of any focal deficits appreciated Cutaneous: Diffuse maculopapular rash appreciated in particular in his left lower extremity associated with scaling also noted in his right lower extremity, somewhat improved from yesterday.    Assessment/Plan:  1.  Respiratory distress.  Improved, off of BiPAP.  Bronchospasm resolved.  Wean off steroids quickly.  Continue nebulization treatments.  Suspect he had an element of "cardiac asthma".  Has responded to diuresis.  2.  Atrial fibrillation with rapid ventricular response.  Converted with amiodarone.  On 5 mg/h of Cardizem.  Cardiology following appreciate input  3.  Peripheral arterial disease.  Patient continues to be on heparin, angiogram today per vascular  4.  Elevated CK.  Continues to decrease, will recheck this morning.  Likely due to lower extremity ischemia.  5.  Leukocytosis.  Patient is on cefepime and vancomycin, procalcitonin low.  Leukocytosis resolved.  Discontinue antibiotics.  6.  Anemia no evidence of active bleeding, likely anemia of chronic disease  7.  Cutaneous rash.  Connective tissue work-up thus far reveals a normal complement level, elevated rheumatoid factor, ESR and CRP, extractable nuclear antigens negative except SSA, pending ANCA levels.  If vasculitis, should see on angiogram.   8.  Renal insufficiency.  BUN is 48/creatinine 1.67.  Which has worsened from yesterday.  Continue to monitor.  Avoid nephrotoxins as much as possible.   Overall prognosis guarded given multiple  comorbidities.  Multidisciplinary rounds were performed with ICU team.    Renold Don, MD [facility]   07/06/2018

## 2018-07-06 NOTE — Consult Note (Signed)
Pharmacy Antibiotic Note  [name] [name] is a 85 y.o. male admitted on 06/28/2018 with severe PAD, arterial occlusion and possible PNA.  Patient has leukocytosis and has been receiving levofloxacin.  Pharmacy consulted for vancomycin and cefepime dosing on 01/02.     01/02 Vancomycin 1000mg  IV every 24 hour started.  01/03 Dose changed to 750mg  IV every 24 hours.   Plan: 01/06 Vancomycin trough 14. Level within goal.  Scr  Increases again (1.06>>1.53>> 1.67). Will continue vancomycin 750mg  IV every 24 hours for now.  Goal trough 10-15 mcg/mL. Pharmacy will monitor renal function and adjust dose as needed.   CrCl now <38ml/min. Will adjust Cefepime to 2g IV every 24 hours.   New Kinetics:   Ke:0.030    Vd: 48    T1/2: 23  Height: 5\' 7"  (170.2 cm) Weight: 151 lb 10.8 oz (68.8 kg) IBW/kg (Calculated) : 66.1  Temp (24hrs), Avg:98.3 F (36.8 C), Min:98.1 F (36.7 C), Max:98.8 F (37.1 C)  Recent Labs  Lab 07/01/18 0449 07/02/18 0439 07/03/18 0256 07/04/18 0511 07/05/18 0416 07/06/18 0543  WBC 19.2* 17.5* 20.0* 19.3* 19.7* 21.5*  CREATININE 1.06  --  1.53* 1.53* 1.60* 1.67*  VANCOTROUGH  --   --   --   --   --  14*    Estimated Creatinine Clearance: 28.6 mL/min (A) (by C-G formula based on SCr of 1.67 mg/dL (H)).    Allergies  Allergen Reactions  . Aspirin Anaphylaxis, Swelling and Shortness Of Breath    REACTION: terrible bp reaction/wheezing  . Bee Pollen Other (See Comments)    ragweed  . Pollen Extract     ragweed  . Rabbit Epithelium     rabbits    Antimicrobials this admission: 1/1 levofloxacin >> 1/2 1/2 vancomycin >>   1/2 cefepime>>   Dose adjustments this admission: 1/3 Vanc dose changed from 1g q24h to 750mg  q24h due to chage in renal function   Microbiology results: 12/29 BCx: gram variable rods, Bacillus spp 01/02 MRSA PCR: negative   Thank you for allowing pharmacy to be a part of this patient's care.  Gardner Candle, PharmD, BCPS Clinical  Pharmacist 07/06/2018 7:26 AM

## 2018-07-06 NOTE — Progress Notes (Signed)
Progress Note  Patient Name: [name] [name] Date of Encounter: 07/06/2018  Primary Cardiologist: Yvonne Kendallhristopher End, MD   Subjective   The patient is alert.  He is on BiPAP.  Denies chest pain or significant dyspnea.  He does complain of left foot pain.  Inpatient Medications    Scheduled Meds: . amiodarone  400 mg Oral Daily  . atenolol  50 mg Oral Daily  . budesonide (PULMICORT) nebulizer solution  0.5 mg Nebulization BID  . cholecalciferol  5,000 Units Oral Daily  . clopidogrel  75 mg Oral Daily  . donepezil  5 mg Oral QPM  . furosemide  40 mg Intravenous Once  . ipratropium-albuterol  3 mL Nebulization Q4H  . methylPREDNISolone (SOLU-MEDROL) injection  60 mg Intravenous Q6H  . omega-3 acid ethyl esters  1 g Oral Daily  . senna-docusate  1 tablet Oral BID  . sodium chloride flush  10-40 mL Intracatheter Q12H  . sodium chloride flush  3 mL Intravenous Q12H  . triamcinolone cream  1 application Topical TID  . vitamin B-12  1,000 mcg Oral Daily   Continuous Infusions: . sodium chloride    . albuterol 10 mg/hr (07/06/18 0801)  . ceFEPime (MAXIPIME) IV    . diltiazem (CARDIZEM) infusion Stopped (07/03/18 1244)  . heparin 1,000 Units/hr (07/06/18 0200)  . vancomycin 750 mg (07/06/18 0739)   PRN Meds: sodium chloride, acetaminophen **OR** acetaminophen, guaiFENesin, hydrALAZINE, levalbuterol, lip balm, loratadine, morphine injection, ondansetron **OR** ondansetron (ZOFRAN) IV, oxyCODONE, sodium chloride flush, sodium chloride flush   Vital Signs    Vitals:   07/06/18 0225 07/06/18 0349 07/06/18 0400 07/06/18 0803  BP:   (!) 157/63   Pulse: 71 65 69 71  Resp: 19 16 20 19   Temp:   98.1 F (36.7 C)   TempSrc:   Oral   SpO2: 97% 98% 97% 98%  Weight:      Height:        Intake/Output Summary (Last 24 hours) at 07/06/2018 0926 Last data filed at 07/06/2018 0600 Gross per 24 hour  Intake 574.62 ml  Output 925 ml  Net -350.38 ml   Filed Weights   06/28/18 1553  07/02/18 1125  Weight: 68.9 kg 68.8 kg    Telemetry    NSR - Personally Reviewed  ECG     - Personally Reviewed  Physical Exam   GEN: No acute distress.   Neck: No JVD Cardiac: RRR, no murmurs, rubs, or gallops.  Respiratory:  Mildly diminished breath sounds with minimal wheezing. GI: Soft, nontender, non-distended  MS: No edema; No deformity. Neuro:  Nonfocal  Psych: Normal affect   Labs    Chemistry Recent Labs  Lab 07/04/18 0511 07/05/18 0416 07/06/18 0543  NA 138 135 140  K 3.9 3.6 3.4*  CL 109 106 110  CO2 22 23 24   GLUCOSE 151* 140* 154*  BUN 39* 43* 48*  CREATININE 1.53* 1.60* 1.67*  CALCIUM 8.2* 8.4* 8.4*  PROT  --   --  5.6*  ALBUMIN  --   --  2.2*  AST  --   --  33  ALT  --   --  41  ALKPHOS  --   --  54  BILITOT  --   --  0.6  GFRNONAA 40* 38* 36*  GFRAA 46* 44* 42*  ANIONGAP 7 6 6      Hematology Recent Labs  Lab 07/04/18 0511 07/05/18 0416 07/06/18 0543  WBC 19.3* 19.7* 21.5*  RBC 3.84*  3.82* 3.82*  HGB 9.0* 9.0* 9.0*  HCT 29.2* 28.8* 28.6*  MCV 76.0* 75.4* 74.9*  MCH 23.4* 23.6* 23.6*  MCHC 30.8 31.3 31.5  RDW 16.0* 15.9* 16.3*  PLT 309 292 277    Cardiac EnzymesNo results for input(s): TROPONINI in the last 168 hours. No results for input(s): TROPIPOC in the last 168 hours.   BNPNo results for input(s): BNP, PROBNP in the last 168 hours.   DDimer No results for input(s): DDIMER in the last 168 hours.   Radiology    Dg Chest Port 1 View  Result Date: 07/06/2018 CLINICAL DATA:  85 year old male with pulmonary edema. Subsequent encounter. EXAM: PORTABLE CHEST 1 VIEW COMPARISON:  06/30/2018, 06/14/2017, 10/08/2016 and 06/28/2015 chest x-ray. FINDINGS: Right PICC line tip proximal superior vena cava level. Heart size within normal limits. Calcified aorta. Interval decrease in degree of pulmonary edema. Possibly loculated small pleural effusion versus atelectasis left lung base. Left base infiltrate less likely consideration. Small  left-sided pleural effusion. Persistent scarring right mid lung. Small amount of fissural fluid may be present. IMPRESSION: 1. Interval decrease in degree of pulmonary edema. 2. Possibly loculated pleural effusion versus atelectasis left lung base. Infiltrate less likely consideration. 3. Persistent scarring right mid lung. Small amount of fissural fluid may be present. Small right-sided pleural effusion. 4. Right PICC line tip proximal superior vena cava level. 5.  Aortic Atherosclerosis (ICD10-I70.0). Electronically Signed   By: Lacy Duverney M.D.   On: 07/06/2018 07:11    Cardiac Studies   Echocardiogram done in 2017-10-01 - Left ventricle: The cavity size was normal. Systolic function was   normal. The estimated ejection fraction was in the range of 55%   to 60%. Wall motion was normal; there were no regional wall   motion abnormalities. Doppler parameters are consistent with   abnormal left ventricular relaxation (grade 1 diastolic   dysfunction). - Aortic valve: Transvalvular velocity was within the normal range.   there was sclerosis without significant stenosis. - Left atrium: The atrium was normal in size. - Right ventricle: Systolic function was normal. - Pulmonary arteries: Systolic pressure was within the normal   range.  Patient Profile     85 y.o. male history of paroxysmal atrial fibrillation, coronary artery disease with an occluded LAD, prior STEMI with PCI to the distal RCA, chronic kidney disease and peripheral arterial disease who initially presented for lower extremity angiography which was in respiratory distress and A. fib with RVR.  Assessment & Plan    1.  Paroxysmal atrial fibrillation: He is currently maintaining in sinus rhythm.  Continue amiodarone 400 mg by mouth once daily.  We will going to switch atenolol to metoprolol given underlying chronic kidney disease. He is currently being anticoagulated with unfractionated heparin and should consider switching to  a NOAC before hospital discharge once no more procedures are planned.  2.  Respiratory failure: Improved with antibiotics and gentle diuresis.  3.  Coronary artery disease with stable angina: Most recent ejection fraction was normal.  Continue medical therapy.  4.  Peripheral arterial disease: Managed by vascular surgery.       For questions or updates, please contact [facility] Please consult [email or web address] for contact info under        Signed, Lorine Bears, MD  07/06/2018, 9:26 AM

## 2018-07-07 ENCOUNTER — Encounter: Payer: Self-pay | Admitting: Vascular Surgery

## 2018-07-07 DIAGNOSIS — I5033 Acute on chronic diastolic (congestive) heart failure: Secondary | ICD-10-CM

## 2018-07-07 LAB — CBC WITH DIFFERENTIAL/PLATELET
Abs Immature Granulocytes: 0.33 10*3/uL — ABNORMAL HIGH (ref 0.00–0.07)
BASOS PCT: 0 %
Basophils Absolute: 0 10*3/uL (ref 0.0–0.1)
Eosinophils Absolute: 0 10*3/uL (ref 0.0–0.5)
Eosinophils Relative: 0 %
HCT: 29 % — ABNORMAL LOW (ref 39.0–52.0)
Hemoglobin: 9 g/dL — ABNORMAL LOW (ref 13.0–17.0)
Immature Granulocytes: 2 %
Lymphocytes Relative: 5 %
Lymphs Abs: 0.9 10*3/uL (ref 0.7–4.0)
MCH: 23 pg — ABNORMAL LOW (ref 26.0–34.0)
MCHC: 31 g/dL (ref 30.0–36.0)
MCV: 74 fL — ABNORMAL LOW (ref 80.0–100.0)
Monocytes Absolute: 0.5 10*3/uL (ref 0.1–1.0)
Monocytes Relative: 3 %
Neutro Abs: 18.5 10*3/uL — ABNORMAL HIGH (ref 1.7–7.7)
Neutrophils Relative %: 90 %
Platelets: 267 10*3/uL (ref 150–400)
RBC: 3.92 MIL/uL — AB (ref 4.22–5.81)
RDW: 16.5 % — ABNORMAL HIGH (ref 11.5–15.5)
WBC: 20.3 10*3/uL — ABNORMAL HIGH (ref 4.0–10.5)
nRBC: 0 % (ref 0.0–0.2)

## 2018-07-07 LAB — PROCALCITONIN: PROCALCITONIN: 0.17 ng/mL

## 2018-07-07 LAB — HEPARIN LEVEL (UNFRACTIONATED)
HEPARIN UNFRACTIONATED: 0.83 [IU]/mL — AB (ref 0.30–0.70)
Heparin Unfractionated: 0.5 IU/mL (ref 0.30–0.70)
Heparin Unfractionated: 0.45 IU/mL (ref 0.30–0.70)

## 2018-07-07 LAB — BASIC METABOLIC PANEL
Anion gap: 8 (ref 5–15)
BUN: 50 mg/dL — ABNORMAL HIGH (ref 8–23)
CO2: 24 mmol/L (ref 22–32)
Calcium: 8.2 mg/dL — ABNORMAL LOW (ref 8.9–10.3)
Chloride: 110 mmol/L (ref 98–111)
Creatinine, Ser: 1.55 mg/dL — ABNORMAL HIGH (ref 0.61–1.24)
GFR calc Af Amer: 46 mL/min — ABNORMAL LOW (ref >60)
GFR calc non Af Amer: 39 mL/min — ABNORMAL LOW (ref >60)
GLUCOSE: 156 mg/dL — AB (ref 70–99)
Potassium: 3.4 mmol/L — ABNORMAL LOW (ref 3.5–5.1)
Sodium: 142 mmol/L (ref 135–145)

## 2018-07-07 LAB — C3 COMPLEMENT: C3 Complement: 167 mg/dL (ref 82–167)

## 2018-07-07 LAB — T3, FREE: T3, Free: 2 pg/mL (ref 2.0–4.4)

## 2018-07-07 LAB — C4 COMPLEMENT: Complement C4, Body Fluid: 62 mg/dL — ABNORMAL HIGH (ref 14–44)

## 2018-07-07 MED ORDER — METHYLPREDNISOLONE SODIUM SUCC 40 MG IJ SOLR
40.0000 mg | Freq: Two times a day (BID) | INTRAMUSCULAR | Status: DC
  Administered 2018-07-07 – 2018-07-10 (×6): 40 mg via INTRAVENOUS
  Filled 2018-07-07 (×6): qty 1

## 2018-07-07 MED ORDER — IPRATROPIUM-ALBUTEROL 0.5-2.5 (3) MG/3ML IN SOLN
3.0000 mL | Freq: Three times a day (TID) | RESPIRATORY_TRACT | Status: DC
  Administered 2018-07-07 – 2018-07-10 (×9): 3 mL via RESPIRATORY_TRACT
  Filled 2018-07-07 (×10): qty 3

## 2018-07-07 MED ORDER — POTASSIUM CHLORIDE 10 MEQ/50ML IV SOLN
10.0000 meq | INTRAVENOUS | Status: AC
  Administered 2018-07-07 (×2): 10 meq via INTRAVENOUS
  Filled 2018-07-07 (×2): qty 50

## 2018-07-07 NOTE — Progress Notes (Signed)
ANTICOAGULATION CONSULT NOTE  Pharmacy Consult for Heparin  Indication: arterial occlusion  Patient Measurements: Height: 5\' 7"  (170.2 cm) Weight: 151 lb 10.8 oz (68.8 kg) IBW/kg (Calculated) : 66.1 Heparin Dosing Weight:  68.9 kg   Vital Signs: Temp: 96.5 F (35.8 C) (01/07 0200) Temp Source: Axillary (01/07 0200) BP: 177/63 (01/07 0600) Pulse Rate: 73 (01/07 0600)  Labs: Recent Labs    07/05/18 0416  07/05/18 1636 07/06/18 0543 07/07/18 0433  HGB 9.0*  --   --  9.0* 9.0*  HCT 28.8*  --   --  28.6* 29.0*  PLT 292  --   --  277 267  HEPARINUNFRC  --    < > 0.59 0.35 0.83*  CREATININE 1.60*  --   --  1.67* 1.55*  CKTOTAL 368  --   --   --   --    < > = values in this interval not displayed.    Estimated Creatinine Clearance: 30.8 mL/min (A) (by C-G formula based on SCr of 1.55 mg/dL (H)).   Medical History: Past Medical History:  Diagnosis Date  . Arthritis   . CKD (chronic kidney disease) stage 3, GFR 30-59 ml/min (HCC) 12/29/2015  . Dry gangrene (HCC) 06/16/2017   Great toe and 5th toe LEFT foot; Dr. Wyn Quakerew  . Hyperlipidemia   . Hypertension   . Left leg DVT (HCC) 01/2014  . Myocardial infarction (HCC)   . Pulmonary embolism (HCC) 01/2014  . Thoracic aorta atherosclerosis (HCC) 10/08/2016    Medications:  Medications Prior to Admission  Medication Sig Dispense Refill Last Dose  . atenolol (TENORMIN) 50 MG tablet TAKE 1 TABLET (50 MG TOTAL) BY MOUTH DAILY. 90 tablet 1 06/27/2018 at 0800  . atorvastatin (LIPITOR) 10 MG tablet TAKE 1 TABLET BY MOUTH EVERYDAY AT BEDTIME 90 tablet 1 06/27/2018 at 2000  . Cholecalciferol (VITAMIN D-1000 MAX ST) 1000 units tablet Take 5 tablets by mouth daily.   06/27/2018 at 0800  . clopidogrel (PLAVIX) 75 MG tablet Take 1 tablet (75 mg total) by mouth daily. Use INSTEAD of Eliquis 30 tablet 0 06/28/2018 at 0800  . donepezil (ARICEPT) 5 MG tablet Take 1 tablet by mouth every evening.  6 06/27/2018 at 2000  . loratadine (CLARITIN) 10 MG  tablet Take 1 tablet (10 mg total) by mouth daily as needed for allergies.   06/27/2018 at 0800  . Omega-3 Fatty Acids (FISH OIL PO) Take 1 capsule by mouth 2 (two) times daily.   06/27/2018 at 0800  . triamcinolone lotion (KENALOG) 0.1 % Apply 1 application topically 3 (three) times daily. 120 mL 1 06/27/2018 at 0800  . vitamin B-12 (CYANOCOBALAMIN) 1000 MCG tablet Take 1 tablet by mouth daily.   06/27/2018 at 0800  . albuterol (PROVENTIL HFA;VENTOLIN HFA) 108 (90 Base) MCG/ACT inhaler Inhale 2 puffs into the lungs every 4 (four) hours as needed for wheezing or shortness of breath. (Patient not taking: Reported on 06/28/2018) 1 Inhaler 1 Not Taking at Unknown time    Assessment: Pharmacy consulted to dose heparin in this 85 year old male with occluded artery. Heparin rate decreased around 2300 on 1/4 to 1000 units/hr.   01/05 Anti-Xa level therapeutic x 2. Heparin infusion continued at 1000 units/hr.   Goal of Therapy:  Heparin level 0.3-0.7 units/ml Monitor platelets by anticoagulation protocol: Yes   Plan:  01/06 AM: HL 0.35. Level remains therapeutic.   Will continue heparin infusion at 1000 units/hr.   Pharmacy will continue to monitor heparin level  and CBC daily per protocol and adjust per consult.   1/7 AM heparin level 0.83. Decrease to 850 units/hr and recheck in 8 hours   Jon S, PharmD, BCPS Clinical Pharmacist 07/07/2018 6:11 AM

## 2018-07-07 NOTE — Treatment Plan (Signed)
Patient was discussed during multidisciplinary rounds.  I discussed the case also with palliative care team.  Have reviewed Dr. Driscilla Grammes notes.  The patient has a nonsalvageable limb.  He will not tolerate amputation.  He will be transitioned to care with intent of palliation.  Palliative care team will meet with the family to determine with regards to comfort measures.  The patient may be transferred out of the stepdown unit.  He will be transferred to the MedSurg unit.  Pulmonary/Critical Care will sign off please reconsult as needed.

## 2018-07-07 NOTE — Progress Notes (Signed)
[facility] [place] NAME: [name] [name]    MR#:  [id number]  DATE OF BIRTH:  1928/03/03  SUBJECTIVE:  Patient went for angiogram yesterday with unsuccess. Will need comfort care or amputation Palliative care met with family this morning   REVIEW OF SYSTEMS:    Review of Systems  Constitutional: Negative.  Negative for chills, fever and malaise/fatigue.  HENT: Negative.  Negative for ear discharge, ear pain, hearing loss, nosebleeds and sore throat.   Eyes: Negative.  Negative for blurred vision and pain.  Respiratory: Negative.  Negative for cough, hemoptysis, shortness of breath and wheezing.   Cardiovascular: Negative.  Negative for chest pain, palpitations and leg swelling.  Gastrointestinal: Negative.  Negative for abdominal pain, blood in stool, diarrhea, nausea and vomiting.  Genitourinary: Negative.  Negative for dysuria.  Musculoskeletal: Negative.  Negative for back pain.       Foot pain   Skin: Negative.   Neurological: Negative for dizziness, tremors, speech change, focal weakness, seizures and headaches.  Endo/Heme/Allergies: Negative.  Does not bruise/bleed easily.  Psychiatric/Behavioral: Positive for memory loss. Negative for depression, hallucinations and suicidal ideas.     Tolerating Diet: yes     DRUG ALLERGIES:   Allergies  Allergen Reactions  . Aspirin Anaphylaxis, Swelling and Shortness Of Breath    REACTION: terrible bp reaction/wheezing  . Bee Pollen Other (See Comments)    ragweed  . Pollen Extract     ragweed  . Rabbit Epithelium     rabbits    VITALS:  Blood pressure (!) 165/65, pulse 74, temperature 98.5 F (36.9 C), temperature source Oral, resp. rate 15, height '5\' 7"'  (1.702 m), weight 68.8 kg, SpO2 96 %.  PHYSICAL EXAMINATION:  Constitutional: Appears frail and critically ill-appearing no distress. HENT: Normocephalic. Marland Kitchen BiPAP  eyes: Conjunctivae and EOM are normal. PERRLA, no scleral  icterus.  Neck: Normal ROM. Neck supple. No JVD. No tracheal deviation. CVS regular regular rate and rhythm S1/S2 +, 2/6 murmurs, no gallops, no carotid bruit.  Pulmonary: bilateral wheezing/rhonchi Abdominal: Soft. BS +,  no distension, tenderness, rebound or guarding.  Musculoskeletal: Normal range of motion. No edema and no tenderness.  Neuro: Alert. . No focal deficits.    LABORATORY PANEL:   CBC Recent Labs  Lab 07/07/18 0433  WBC 20.3*  HGB 9.0*  HCT 29.0*  PLT 267   ------------------------------------------------------------------------------------------------------------------  Chemistries  Recent Labs  Lab 07/06/18 0543 07/07/18 0433  NA 140 142  K 3.4* 3.4*  CL 110 110  CO2 24 24  GLUCOSE 154* 156*  BUN 48* 50*  CREATININE 1.67* 1.55*  CALCIUM 8.4* 8.2*  MG 2.3  --   AST 33  --   ALT 41  --   ALKPHOS 54  --   BILITOT 0.6  --    ------------------------------------------------------------------------------------------------------------------  Cardiac Enzymes No results for input(s): TROPONINI in the last 168 hours. ------------------------------------------------------------------------------------------------------------------  RADIOLOGY:  Dg Chest Port 1 View  Result Date: 07/06/2018 CLINICAL DATA:  85 year old male with pulmonary edema. Subsequent encounter. EXAM: PORTABLE CHEST 1 VIEW COMPARISON:  06/30/2018, 06/14/2017, 10/08/2016 and 06/28/2015 chest x-ray. FINDINGS: Right PICC line tip proximal superior vena cava level. Heart size within normal limits. Calcified aorta. Interval decrease in degree of pulmonary edema. Possibly loculated small pleural effusion versus atelectasis left lung base. Left base infiltrate less likely consideration. Small left-sided pleural effusion. Persistent scarring right mid lung. Small amount of fissural fluid may be present. IMPRESSION: 1. Interval decrease  in degree of pulmonary edema. 2. Possibly loculated pleural  effusion versus atelectasis left lung base. Infiltrate less likely consideration. 3. Persistent scarring right mid lung. Small amount of fissural fluid may be present. Small right-sided pleural effusion. 4. Right PICC line tip proximal superior vena cava level. 5.  Aortic Atherosclerosis (ICD10-I70.0). Electronically Signed   By: Genia Del M.D.   On: 07/06/2018 07:11     ASSESSMENT AND PLAN:   85 year old male with a history of PAD CAD, PAF and chronic kidney disease stage III who presented for lower extremity angiogram and found to have acute respiratory distress and atrial fibrillation.  1.  Acute hypoxic respiratory failure in the setting of atrial fibrillation with RVR and pneumonia: Patient weaned to Progress from bipap Wean steroids as tolerated.  He has been treated for pneumonia and off of antibiotics.  2.  Atrial fibrillation with RVR, normal sinus rhythm: Cardiology consultation appreciated Continue diltiazem and amiodarone Continue heparin drip for now  3.  PAD with leg claudication: Family to be leaning towards comfort care and leg is unsalvageable and would require amputation.   Follow-up on palliative care recommendations tomorrow   4.  Chronic kidney disease stage III with stable creatinine 5.  Hypokalemia: Replete PRN  6.  Hypothyroidism with decreased TSH: T4 is normal  7.  Dementia: Continue donepezil  Critically ill.  Overall poor prognosis. Palliative care to meet with family again tomorrow to discuss goals of care Patient would most benefit from hospice  CODE STATUS: DNR  TOTAL TIME TAKING CARE OF THIS PATIENT: 22 minutes.     POSSIBLE D/C ??, DEPENDING ON CLINICAL CONDITION.   Nataliya Graig M.D on 07/07/2018 at 12:46 PM  Between 7am to 6pm - Pager - [phone] After 6pm go to [email or web address] - password [email or web address] [facility]  Office  [phone]  CC: Primary care physician; Arnetha Courser, MD  Note: This dictation was prepared with  Dragon dictation along with smaller phrase technology. Any transcriptional errors that result from this process are unintentional.

## 2018-07-07 NOTE — Progress Notes (Signed)
ANTICOAGULATION CONSULT NOTE  Pharmacy Consult for Heparin  Indication: arterial occlusion  Patient Measurements: Height: 5\' 7"  (170.2 cm) Weight: 151 lb 10.8 oz (68.8 kg) IBW/kg (Calculated) : 66.1 Heparin Dosing Weight:  68.9 kg   Vital Signs: Temp: 97.7 F (36.5 C) (01/07 1945) Temp Source: Oral (01/07 1945) BP: 167/69 (01/07 1945) Pulse Rate: 70 (01/07 1945)  Labs: Recent Labs    07/05/18 0416  07/06/18 0543 07/07/18 0433 07/07/18 1421 07/07/18 2200  HGB 9.0*  --  9.0* 9.0*  --   --   HCT 28.8*  --  28.6* 29.0*  --   --   PLT 292  --  277 267  --   --   HEPARINUNFRC  --    < > 0.35 0.83* 0.50 0.45  CREATININE 1.60*  --  1.67* 1.55*  --   --   CKTOTAL 368  --   --   --   --   --    < > = values in this interval not displayed.    Estimated Creatinine Clearance: 30.8 mL/min (A) (by C-G formula based on SCr of 1.55 mg/dL (H)).   Medical History: Past Medical History:  Diagnosis Date  . Arthritis   . CKD (chronic kidney disease) stage 3, GFR 30-59 ml/min (HCC) 12/29/2015  . Dry gangrene (HCC) 06/16/2017   Great toe and 5th toe LEFT foot; Dr. Wyn Quakerew  . Hyperlipidemia   . Hypertension   . Left leg DVT (HCC) 01/2014  . Myocardial infarction (HCC)   . Pulmonary embolism (HCC) 01/2014  . Thoracic aorta atherosclerosis (HCC) 10/08/2016    Medications:  Medications Prior to Admission  Medication Sig Dispense Refill Last Dose  . atenolol (TENORMIN) 50 MG tablet TAKE 1 TABLET (50 MG TOTAL) BY MOUTH DAILY. 90 tablet 1 06/27/2018 at 0800  . atorvastatin (LIPITOR) 10 MG tablet TAKE 1 TABLET BY MOUTH EVERYDAY AT BEDTIME 90 tablet 1 06/27/2018 at 2000  . Cholecalciferol (VITAMIN D-1000 MAX ST) 1000 units tablet Take 5 tablets by mouth daily.   06/27/2018 at 0800  . clopidogrel (PLAVIX) 75 MG tablet Take 1 tablet (75 mg total) by mouth daily. Use INSTEAD of Eliquis 30 tablet 0 06/28/2018 at 0800  . donepezil (ARICEPT) 5 MG tablet Take 1 tablet by mouth every evening.  6  06/27/2018 at 2000  . loratadine (CLARITIN) 10 MG tablet Take 1 tablet (10 mg total) by mouth daily as needed for allergies.   06/27/2018 at 0800  . Omega-3 Fatty Acids (FISH OIL PO) Take 1 capsule by mouth 2 (two) times daily.   06/27/2018 at 0800  . triamcinolone lotion (KENALOG) 0.1 % Apply 1 application topically 3 (three) times daily. 120 mL 1 06/27/2018 at 0800  . vitamin B-12 (CYANOCOBALAMIN) 1000 MCG tablet Take 1 tablet by mouth daily.   06/27/2018 at 0800  . albuterol (PROVENTIL HFA;VENTOLIN HFA) 108 (90 Base) MCG/ACT inhaler Inhale 2 puffs into the lungs every 4 (four) hours as needed for wheezing or shortness of breath. (Patient not taking: Reported on 06/28/2018) 1 Inhaler 1 Not Taking at Unknown time    Assessment: Pharmacy consulted to dose heparin in this 85 year old male with occluded artery. Heparin rate decreased around 2300 on 1/4 to 1000 units/hr.   01/05 Anti-Xa level therapeutic x 2. Heparin infusion continued at 1000 units/hr.   01/07 AM HL 0.83. Heparin infusion decreased to 850 units/hr  Goal of Therapy:  Heparin level 0.3-0.7 units/ml Monitor platelets by anticoagulation protocol: Yes  Plan:  01/07 @ 1421 HL: 0.50. Level is therapeutic x1. Will decrease to 850 units/hr and recheck in 8 hours  1/7 PM heparin level 0.45. Continue current regimen. Recheck heparin level and CBC with tomorrow AM labs.  Erich Montane, PharmD, BCPS Clinical Pharmacist 07/07/2018 11:46 PM

## 2018-07-07 NOTE — Progress Notes (Signed)
[facility] Daily Progress Note   Subjective: 1 Day Post-Op: Ultrasound guidance for vascular access right femoral artery, 2. Catheter placement into left SFA from right femoral approach, 3. Aortogram and selective left lower extremity angiogram, 4.  Catheter directed thrombolytic therapy with 4 mg of TPA delivered left SFA and popliteal arteries, 5.  Mechanical thrombectomy to the left SFA, popliteal, and anterior tibial arteries with the penumbra cat 6 device, 6.  Percutaneous transluminal angioplasty of the left anterior tibial artery from the ankle all the way up to the origin of the anterior tibial artery with a 2.5 mm diameter by 30 cm length angioplasty balloon, 7.  Percutaneous transluminal angioplasty of the left SFA and popliteal arteries with 5 mm diameter by 22 cm length Lutonix drug-coated angioplasty balloon and a 5 mm diameter by 10 cm length Lutonix drug-coated angioplasty balloon, 8. StarClose closure device right femoral artery  Patient with family at bedside. Son and wife. Discussed the procedure at length, possible AKA, high risk of surgery, post-operative course with patient and family. Still with stable left lower extremity pain.   Objective: Vitals:   07/07/18 1400 07/07/18 1441 07/07/18 1500 07/07/18 1600  BP: (!) 146/58  (!) 155/62 (!) 174/62  Pulse: 78 76 72 76  Resp: (!) 23 19 (!) 22 19  Temp:      TempSrc:      SpO2: 96% 96% 95% 94%  Weight:      Height:        Intake/Output Summary (Last 24 hours) at 07/07/2018 1638 Last data filed at 07/07/2018 1610 Gross per 24 hour  Intake 492.88 ml  Output 750 ml  Net -257.12 ml   Physical Exam: A&Ox2, NAD CV: RRR Pulmonary: CTA Bilaterally Abdomen: Soft, Nontender, Nondistended Vascular:  Right Lower Extremity: Warm distally to toes, good capillary refill  Left Lower Extremity: warm until mid-calf then cooler, tender to palpation, no palpable pedal pulses, mottling noted to foot.     Laboratory: CBC    Component Value Date/Time   WBC 20.3 (H) 07/07/2018 0433   HGB 9.0 (L) 07/07/2018 0433   HGB 11.5 (L) 03/13/2015 0906   HCT 29.0 (L) 07/07/2018 0433   HCT 36.5 (L) 03/13/2015 0906   PLT 267 07/07/2018 0433   PLT 241 03/13/2015 0906   BMET    Component Value Date/Time   NA 142 07/07/2018 0433   NA 141 03/13/2015 0906   NA 137 02/07/2014 0402   K 3.4 (L) 07/07/2018 0433   K 4.3 02/07/2014 0402   CL 110 07/07/2018 0433   CL 103 02/07/2014 0402   CO2 24 07/07/2018 0433   CO2 22 02/07/2014 0402   GLUCOSE 156 (H) 07/07/2018 0433   GLUCOSE 104 (H) 02/06/2014 0620   BUN 50 (H) 07/07/2018 0433   BUN 27 03/13/2015 0906   BUN 20 (H) 02/06/2014 0620   CREATININE 1.55 (H) 07/07/2018 0433   CREATININE 1.57 (H) 06/18/2018 1547   CALCIUM 8.2 (L) 07/07/2018 0433   CALCIUM 8.1 (L) 02/06/2014 0620   GFRNONAA 39 (L) 07/07/2018 0433   GFRNONAA 39 (L) 06/18/2018 1547   GFRAA 46 (L) 07/07/2018 0433   GFRAA 45 (L) 06/18/2018 1547   Assessment/Planning: 85 year old male with severe LLE extremity PAD s/p POD#1 LLE angiogram 1) Angiogram notable for non-salvagable limb 2) Recommend left AKA vs hospice / palliative care 3) Surgery high risk due to cardiopulmonary status 4) Possible meeting tomorrow when remaining family arrives  Discussed with  Dr. Charlie PitterSchnier  Tao Satz PA-C 07/07/2018 4:38 PM

## 2018-07-07 NOTE — Progress Notes (Signed)
Palliative:  HPI: 85 y.o. 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 Hospitalization complicated by rapid atrial fibrillation and severe dyspnea requiring BiPAP and ICU stay. Has weaned off BiPAP 07/06/18 with plans for potential angiogram today. 07/07/18 he is more confused and breathing is more labored. Angiogram was completed 07/06/18 and now faced with decision for amputation L AKA vs comfort care.   I met today with "[name] [name]" who continues with labored breathing and is more confused today. He is seeing ants on his pillow and in his room (I did not see any ants). I attempted to gently explain how ill he is and the situation with his left leg. He is able to say "it's bad" but unable to tell me anything else and is often distracted talking about how dry he is and asking for water. I did ask him how he would feel if the doctor's felt he needed an amputation and he did reply "they aren't taking my leg" but is unable to expand so difficult to tell how much he understands.   I spoke with son, [name] [name], in the room as well as daughter, [name] [name], via telephone (separately). I explained the situation and the decision of amputation with an emphasis that surgery would be very risky and at best I would be concerned with his QOL after amputation and if this is what he would want. [name] [name] feels that his father is too proud and would not want an amputation or have to rely on other for ADLs. [name] [name] initially felt he should have amputation but then recalled that he has often told her lately that he wants to go to heaven and that he questions why God has not taken him yet. [name] [name] is more concerned with their mother as she does have dementia and is very dependent on [name] [name].   I have also left a voicemail for daughter, [name] [name], who is a Marine scientist and [name] [name]  says he relies on her opinion and explanations. Family reports that [name] [name] should be arriving from out of town today or tomorrow. I have requested to meet with her when she gets here. I will plan to speak more with wife and other daughter ([name] [name] via telephone) at that time.   I will also continue to discuss with [name] [name] as his mentation allows. I encouraged children to discuss amongst themselves and we will continue the conversation. Emotional support provided to family.   Exam: Alert, confused. Easily fatigued. Labored breathing with rhonchi/rales. Left foot pedal pulse unable to palpate and foot cool to touch.   Plan: - Hopeful to meet with other family members tomorrow [name] [name] confirms meeting tomorrow 1/8 ~1400) - They family I have spoken with at present are leaning more towards no amputation and for comfort - Final decision yet to be made  [id number] 251 min  Vinie Sill, NP Palliative Medicine Team Pager # [phone] (M-F 8a-5p) Team Phone # [phone] (Nights/Weekends)

## 2018-07-07 NOTE — Progress Notes (Signed)
Progress Note  Patient Name: Nedra Haihomas E Perrott Date of Encounter: 07/07/2018  Primary Cardiologist: Yvonne Kendallhristopher Rufino Staup, MD   Subjective   Complains of left leg pain and shortness of breath.  Underwent percutaneous intervention to LLE yesterday by vascular surgery.  Per Dr. Jayme CloudGonzalez, anticipate transitioning to comfort care.  Inpatient Medications    Scheduled Meds: . amiodarone  400 mg Oral Daily  . budesonide (PULMICORT) nebulizer solution  0.5 mg Nebulization BID  . cholecalciferol  5,000 Units Oral Daily  . clopidogrel  75 mg Oral Daily  . donepezil  5 mg Oral QPM  . furosemide  40 mg Intravenous Once  . ipratropium-albuterol  3 mL Nebulization Q4H  . methylPREDNISolone (SOLU-MEDROL) injection  60 mg Intravenous Q6H  . metoprolol tartrate  25 mg Oral BID  . omega-3 acid ethyl esters  1 g Oral Daily  . senna-docusate  1 tablet Oral BID  . sodium chloride flush  10-40 mL Intracatheter Q12H  . sodium chloride flush  3 mL Intravenous Q12H  . triamcinolone cream  1 application Topical TID  . vitamin B-12  1,000 mcg Oral Daily   Continuous Infusions: . sodium chloride    . albuterol 10 mg/hr (07/06/18 0801)  .  ceFAZolin (ANCEF) IV    . dexmedetomidine (PRECEDEX) IV infusion Stopped (07/06/18 2228)  . diltiazem (CARDIZEM) infusion Stopped (07/03/18 1244)  . heparin 850 Units/hr (07/07/18 0625)   PRN Meds: sodium chloride, acetaminophen **OR** acetaminophen, guaiFENesin, hydrALAZINE, levalbuterol, lip balm, loratadine, morphine injection, ondansetron **OR** ondansetron (ZOFRAN) IV, oxyCODONE, sodium chloride flush, sodium chloride flush   Vital Signs    Vitals:   07/07/18 0700 07/07/18 0743 07/07/18 0800 07/07/18 0900  BP: (!) 174/68  (!) 141/59 137/61  Pulse: 73 74 81 85  Resp: (!) 28 19 (!) 26 20  Temp:   98.5 F (36.9 C)   TempSrc:   Oral   SpO2: 95% 94% 92% 94%  Weight:      Height:        Intake/Output Summary (Last 24 hours) at 07/07/2018 0920 Last data filed at  07/07/2018 0800 Gross per 24 hour  Intake 460.49 ml  Output 900 ml  Net -439.51 ml   Filed Weights   06/28/18 1553 07/02/18 1125  Weight: 68.9 kg 68.8 kg    Telemetry    NSR with PAC's and PVC's - Personally Reviewed  ECG    No new tracing.  Physical Exam   GEN: Frail with increased WOB. Neck: JVP ~8 cm with +HJR. Cardiac: RRR, no murmurs, rubs, or gallops.  Respiratory: Diminished breath sounds at both bases.  Scattered rhonchi and wheezes. GI: Soft, nontender, non-distended  MS: No edema; marked purpura of left foot/calf. Neuro:  Nonfocal  Psych: Anxious.  Labs    Chemistry Recent Labs  Lab 07/05/18 0416 07/06/18 0543 07/07/18 0433  NA 135 140 142  K 3.6 3.4* 3.4*  CL 106 110 110  CO2 23 24 24   GLUCOSE 140* 154* 156*  BUN 43* 48* 50*  CREATININE 1.60* 1.67* 1.55*  CALCIUM 8.4* 8.4* 8.2*  PROT  --  5.6*  --   ALBUMIN  --  2.2*  --   AST  --  33  --   ALT  --  41  --   ALKPHOS  --  54  --   BILITOT  --  0.6  --   GFRNONAA 38* 36* 39*  GFRAA 44* 42* 46*  ANIONGAP 6 6 8  Hematology Recent Labs  Lab 07/05/18 0416 07/06/18 0543 07/07/18 0433  WBC 19.7* 21.5* 20.3*  RBC 3.82* 3.82* 3.92*  HGB 9.0* 9.0* 9.0*  HCT 28.8* 28.6* 29.0*  MCV 75.4* 74.9* 74.0*  MCH 23.6* 23.6* 23.0*  MCHC 31.3 31.5 31.0  RDW 15.9* 16.3* 16.5*  PLT 292 277 267    Cardiac EnzymesNo results for input(s): TROPONINI in the last 168 hours. No results for input(s): TROPIPOC in the last 168 hours.   BNPNo results for input(s): BNP, PROBNP in the last 168 hours.   DDimer No results for input(s): DDIMER in the last 168 hours.   Radiology    Dg Chest Port 1 View  Result Date: 07/06/2018 CLINICAL DATA:  85 year old male with pulmonary edema. Subsequent encounter. EXAM: PORTABLE CHEST 1 VIEW COMPARISON:  06/30/2018, 06/14/2017, 10/08/2016 and 06/28/2015 chest x-ray. FINDINGS: Right PICC line tip proximal superior vena cava level. Heart size within normal limits. Calcified  aorta. Interval decrease in degree of pulmonary edema. Possibly loculated small pleural effusion versus atelectasis left lung base. Left base infiltrate less likely consideration. Small left-sided pleural effusion. Persistent scarring right mid lung. Small amount of fissural fluid may be present. IMPRESSION: 1. Interval decrease in degree of pulmonary edema. 2. Possibly loculated pleural effusion versus atelectasis left lung base. Infiltrate less likely consideration. 3. Persistent scarring right mid lung. Small amount of fissural fluid may be present. Small right-sided pleural effusion. 4. Right PICC line tip proximal superior vena cava level. 5.  Aortic Atherosclerosis (ICD10-I70.0). Electronically Signed   By: Lacy Duverney M.D.   On: 07/06/2018 07:11    Cardiac Studies   Echocardiogram done in Oct 02, 2017 - Left ventricle: The cavity size was normal. Systolic function was normal. The estimated ejection fraction was in the range of 55% to 60%. Wall motion was normal; there were no regional wall motion abnormalities. Doppler parameters are consistent with abnormal left ventricular relaxation (grade 1 diastolic dysfunction). - Aortic valve: Transvalvular velocity was within the normal range. there was sclerosis without significant stenosis. - Left atrium: The atrium was normal in size. - Right ventricle: Systolic function was normal. - Pulmonary arteries: Systolic pressure was within the normal range.  Patient Profile     85 y.o. male with history of paroxysmal atrial fibrillation, coronary artery disease with an occluded LAD, prior STEMI with PCI to the distal RCA, chronic kidney disease and peripheral arterial disease who initially presented for lower extremity angiography which was in respiratory distress and A. fib with RVR.  Assessment & Plan    Atrial fibrillation Maintaining NSR on oral amiodarone.  Continue amiodarone 400 mg daily for now; could be decreased to  200 mg daily at d/c.  Continue IV heparin.  If plan is to move forward with comfort care, I think it is reasonable to forgo long-term anticoagulation.  Acute on chronic diastolic heart failure Fluid retention likely contributing to shortness of breath.  Creatinine slightly improved today.  Patient net negative 340 mL yesterday but still positive > 6L during this admission.  Agree with furosemide 40 mg IV x 1; further dosing based on UOP and volume/respiratory status.  Wean steroids as tolerated, as they may be contributing to fluid retention.  Coronary artery disease with stable angina No significant chest pain.  Given comorbidities and plans to transition to comfort care, no further work-up is recommended.  Continue medical therapy.  PAD  Per vascular surgery.  [facility] will sign off.   Medication Recommendations:  Transition amiodarone  to 200 mg daily at discharge.  Diuresis per CCM. Other recommendations (labs, testing, etc):  None Follow up as an outpatient:  2-4 weeks after discharge.  For questions or updates, please contact [facility] Please consult [email or web address] for contact info under [facility].     Signed, Yvonne Kendallhristopher Aysel Gilchrest, MD  07/07/2018, 9:20 AM

## 2018-07-07 NOTE — Progress Notes (Signed)
ANTICOAGULATION CONSULT NOTE  Pharmacy Consult for Heparin  Indication: arterial occlusion  Patient Measurements: Height: 5\' 7"  (170.2 cm) Weight: 151 lb 10.8 oz (68.8 kg) IBW/kg (Calculated) : 66.1 Heparin Dosing Weight:  68.9 kg   Vital Signs: Temp: 98.5 F (36.9 C) (01/07 0800) Temp Source: Oral (01/07 0800) BP: 165/65 (01/07 1100) Pulse Rate: 76 (01/07 1441)  Labs: Recent Labs    07/05/18 0416  07/06/18 0543 07/07/18 0433 07/07/18 1421  HGB 9.0*  --  9.0* 9.0*  --   HCT 28.8*  --  28.6* 29.0*  --   PLT 292  --  277 267  --   HEPARINUNFRC  --    < > 0.35 0.83* 0.50  CREATININE 1.60*  --  1.67* 1.55*  --   CKTOTAL 368  --   --   --   --    < > = values in this interval not displayed.    Estimated Creatinine Clearance: 30.8 mL/min (A) (by C-G formula based on SCr of 1.55 mg/dL (H)).   Medical History: Past Medical History:  Diagnosis Date  . Arthritis   . CKD (chronic kidney disease) stage 3, GFR 30-59 ml/min (HCC) 12/29/2015  . Dry gangrene (HCC) 06/16/2017   Great toe and 5th toe LEFT foot; Dr. Wyn Quaker  . Hyperlipidemia   . Hypertension   . Left leg DVT (HCC) 01/2014  . Myocardial infarction (HCC)   . Pulmonary embolism (HCC) 01/2014  . Thoracic aorta atherosclerosis (HCC) 10/08/2016    Medications:  Medications Prior to Admission  Medication Sig Dispense Refill Last Dose  . atenolol (TENORMIN) 50 MG tablet TAKE 1 TABLET (50 MG TOTAL) BY MOUTH DAILY. 90 tablet 1 06/27/2018 at 0800  . atorvastatin (LIPITOR) 10 MG tablet TAKE 1 TABLET BY MOUTH EVERYDAY AT BEDTIME 90 tablet 1 06/27/2018 at 2000  . Cholecalciferol (VITAMIN D-1000 MAX ST) 1000 units tablet Take 5 tablets by mouth daily.   06/27/2018 at 0800  . clopidogrel (PLAVIX) 75 MG tablet Take 1 tablet (75 mg total) by mouth daily. Use INSTEAD of Eliquis 30 tablet 0 06/28/2018 at 0800  . donepezil (ARICEPT) 5 MG tablet Take 1 tablet by mouth every evening.  6 06/27/2018 at 2000  . loratadine (CLARITIN) 10 MG  tablet Take 1 tablet (10 mg total) by mouth daily as needed for allergies.   06/27/2018 at 0800  . Omega-3 Fatty Acids (FISH OIL PO) Take 1 capsule by mouth 2 (two) times daily.   06/27/2018 at 0800  . triamcinolone lotion (KENALOG) 0.1 % Apply 1 application topically 3 (three) times daily. 120 mL 1 06/27/2018 at 0800  . vitamin B-12 (CYANOCOBALAMIN) 1000 MCG tablet Take 1 tablet by mouth daily.   06/27/2018 at 0800  . albuterol (PROVENTIL HFA;VENTOLIN HFA) 108 (90 Base) MCG/ACT inhaler Inhale 2 puffs into the lungs every 4 (four) hours as needed for wheezing or shortness of breath. (Patient not taking: Reported on 06/28/2018) 1 Inhaler 1 Not Taking at Unknown time    Assessment: Pharmacy consulted to dose heparin in this 85 year old male with occluded artery. Heparin rate decreased around 2300 on 1/4 to 1000 units/hr.   01/05 Anti-Xa level therapeutic x 2. Heparin infusion continued at 1000 units/hr.   01/07 AM HL 0.83. Heparin infusion decreased to 850 units/hr  Goal of Therapy:  Heparin level 0.3-0.7 units/ml Monitor platelets by anticoagulation protocol: Yes   Plan:  01/07 @ 1421 HL: 0.50. Level is therapeutic x1. Will decrease to 850 units/hr  and recheck in 8 hours  Gardner CandleSheema M Raja Caputi, PharmD, BCPS Clinical Pharmacist 07/07/2018 2:53 PM

## 2018-07-08 LAB — CBC
HCT: 28.6 % — ABNORMAL LOW (ref 39.0–52.0)
Hemoglobin: 9 g/dL — ABNORMAL LOW (ref 13.0–17.0)
MCH: 23.2 pg — ABNORMAL LOW (ref 26.0–34.0)
MCHC: 31.5 g/dL (ref 30.0–36.0)
MCV: 73.7 fL — AB (ref 80.0–100.0)
NRBC: 0 % (ref 0.0–0.2)
Platelets: 265 10*3/uL (ref 150–400)
RBC: 3.88 MIL/uL — ABNORMAL LOW (ref 4.22–5.81)
RDW: 16.1 % — ABNORMAL HIGH (ref 11.5–15.5)
WBC: 26.9 10*3/uL — AB (ref 4.0–10.5)

## 2018-07-08 LAB — PROCALCITONIN: Procalcitonin: 0.23 ng/mL

## 2018-07-08 LAB — ANCA TITERS
Atypical P-ANCA titer: <1:20 {titer}
C-ANCA: <1:20 {titer}
P-ANCA: <1:20 {titer}

## 2018-07-08 LAB — HEPARIN LEVEL (UNFRACTIONATED): Heparin Unfractionated: 0.47 IU/mL (ref 0.30–0.70)

## 2018-07-08 NOTE — Progress Notes (Signed)
Palliative:  I met again today with [name] [name], his wife, daughter, Karna Christmas, and daughter Lynelle Smoke was on speaker phone. We attempted to discuss together with [name] his options, expectations, and his wishes. He does consistently say NO amputation and even at one time he says "if I die, then I die" when I tell him that he will pass away without amputation. His wife is upset but I did explain to her that surgery for him would be very risky and that many of his doctors do not feel surgery would be a good idea for him. She is saddened but hears his opinion. However, at other parts of the conversation [name] [name] tells [place] that he is getting better and he will make his leg better as well as "I'm going to live a long time."   All family appear to be supporting the decision for comfort care over amputation. They struggle with his confusion and that he is not making a clear decision. We discussed hospice facility vs hospice at home. We discussed the limitations of medications such as anticoagulants and antibiotics in this situation. I am encouraged that [name] [name] is not having much pain and encourage them to make this our focus that he is comfortable and happy. Family would like to continue to talk to each other regarding final decisions. I will follow up tomorrow. Emotional support provided.   Exam: Alert, confused. Better spirits and joking a lot. Breathing still a little labored but much improved from yesterday. LLE discoloration with PAD, no pedal pulse present, cool to touch.   Plan: - Family continuing to talk about final decisions but leaning towards no surgery and considering hospice options.  - Continue pain medication as needed. Daughter concerned that he will be overmedicated at the end of his life but I explain that our goal is to treat his pain with medication needed - no more, no less.   [id number] 58 min  Vinie Sill, NP Palliative Medicine Team Pager # [phone] (M-F 8a-5p) Team Phone # [phone]  (Nights/Weekends)

## 2018-07-08 NOTE — Progress Notes (Signed)
ANTICOAGULATION CONSULT NOTE  Pharmacy Consult for Heparin  Indication: arterial occlusion  Patient Measurements: Height: 5\' 7"  (170.2 cm) Weight: 151 lb 10.8 oz (68.8 kg) IBW/kg (Calculated) : 66.1 Heparin Dosing Weight:  68.9 kg   Vital Signs: Temp: 97.7 F (36.5 C) (01/07 1945) Temp Source: Oral (01/07 1945) BP: 167/69 (01/07 1945) Pulse Rate: 70 (01/07 1945)  Labs: Recent Labs    07/06/18 0543 07/07/18 0433 07/07/18 1421 07/07/18 2200 07/08/18 0319  HGB 9.0* 9.0*  --   --  9.0*  HCT 28.6* 29.0*  --   --  28.6*  PLT 277 267  --   --  265  HEPARINUNFRC 0.35 0.83* 0.50 0.45 0.47  CREATININE 1.67* 1.55*  --   --   --     Estimated Creatinine Clearance: 30.8 mL/min (A) (by C-G formula based on SCr of 1.55 mg/dL (H)).   Medical History: Past Medical History:  Diagnosis Date  . Arthritis   . CKD (chronic kidney disease) stage 3, GFR 30-59 ml/min (HCC) 12/29/2015  . Dry gangrene (HCC) 06/16/2017   Great toe and 5th toe LEFT foot; Dr. Wyn Quaker  . Hyperlipidemia   . Hypertension   . Left leg DVT (HCC) 01/2014  . Myocardial infarction (HCC)   . Pulmonary embolism (HCC) 01/2014  . Thoracic aorta atherosclerosis (HCC) 10/08/2016    Medications:  Medications Prior to Admission  Medication Sig Dispense Refill Last Dose  . atenolol (TENORMIN) 50 MG tablet TAKE 1 TABLET (50 MG TOTAL) BY MOUTH DAILY. 90 tablet 1 06/27/2018 at 0800  . atorvastatin (LIPITOR) 10 MG tablet TAKE 1 TABLET BY MOUTH EVERYDAY AT BEDTIME 90 tablet 1 06/27/2018 at 2000  . Cholecalciferol (VITAMIN D-1000 MAX ST) 1000 units tablet Take 5 tablets by mouth daily.   06/27/2018 at 0800  . clopidogrel (PLAVIX) 75 MG tablet Take 1 tablet (75 mg total) by mouth daily. Use INSTEAD of Eliquis 30 tablet 0 06/28/2018 at 0800  . donepezil (ARICEPT) 5 MG tablet Take 1 tablet by mouth every evening.  6 06/27/2018 at 2000  . loratadine (CLARITIN) 10 MG tablet Take 1 tablet (10 mg total) by mouth daily as needed for  allergies.   06/27/2018 at 0800  . Omega-3 Fatty Acids (FISH OIL PO) Take 1 capsule by mouth 2 (two) times daily.   06/27/2018 at 0800  . triamcinolone lotion (KENALOG) 0.1 % Apply 1 application topically 3 (three) times daily. 120 mL 1 06/27/2018 at 0800  . vitamin B-12 (CYANOCOBALAMIN) 1000 MCG tablet Take 1 tablet by mouth daily.   06/27/2018 at 0800  . albuterol (PROVENTIL HFA;VENTOLIN HFA) 108 (90 Base) MCG/ACT inhaler Inhale 2 puffs into the lungs every 4 (four) hours as needed for wheezing or shortness of breath. (Patient not taking: Reported on 06/28/2018) 1 Inhaler 1 Not Taking at Unknown time    Assessment: Pharmacy consulted to dose heparin in this 85 year old male with occluded artery. Heparin rate decreased around 2300 on 1/4 to 1000 units/hr.   01/05 Anti-Xa level therapeutic x 2. Heparin infusion continued at 1000 units/hr.   01/07 AM HL 0.83. Heparin infusion decreased to 850 units/hr  Goal of Therapy:  Heparin level 0.3-0.7 units/ml Monitor platelets by anticoagulation protocol: Yes   Plan:  01/07 @ 1421 HL: 0.50. Level is therapeutic x1. Will decrease to 850 units/hr and recheck in 8 hours  1/7 PM heparin level 0.45. Continue current regimen. Recheck heparin level and CBC with tomorrow AM labs.  1/8 AM heparin level  0.47. Continue current regimen. Recheck heparin level and CBC with tomorrow AM labs  Erich MontaneMcBane,Chayce Rullo S, PharmD, BCPS Clinical Pharmacist 07/08/2018 4:55 AM

## 2018-07-08 NOTE — Care Management Important Message (Signed)
Copy of signed Medicare IM left with patient in room. 

## 2018-07-08 NOTE — Progress Notes (Signed)
[facility] [place] at [facility]   PATIENT NAME: [name] [name]    MR#:  [id number]  DATE OF BIRTH:  [birth year]  SUBJECTIVE:  Patient confused this morning  Nurse at bedside  REVIEW OF SYSTEMS:    Patient with confusion unable to obtain review of system   Tolerating Diet: yes     DRUG ALLERGIES:   Allergies  Allergen Reactions  . Aspirin Anaphylaxis, Swelling and Shortness Of Breath    REACTION: terrible bp reaction/wheezing  . Bee Pollen Other (See Comments)    ragweed  . Pollen Extract     ragweed  . Rabbit Epithelium     rabbits    VITALS:  Blood pressure (!) 162/59, pulse 82, temperature 98.9 F (37.2 C), temperature source Oral, resp. rate (!) 24, height 5\' 7"  (1.702 m), weight 68.8 kg, SpO2 93 %.  PHYSICAL EXAMINATION:  Constitutional: Appears frail and critically ill-appearing no distress. HENT: Normocephalic. Marland Kitchen BiPAP  eyes: Conjunctivae and EOM are normal. PERRLA, no scleral icterus.  Neck: Normal ROM. Neck supple. No JVD. No tracheal deviation. CVS regular regular rate and rhythm S1/S2 +, 2/6 murmurs, no gallops, no carotid bruit.  Pulmonary: bilateral wheezing/rhonchi Abdominal: Soft. BS +,  no distension, tenderness, rebound or guarding.  Musculoskeletal: Normal range of motion. No edema and no tenderness.  Neuro: Alert. . No focal deficits.  Left leg is cold unable to palpate pulse  LABORATORY PANEL:   CBC Recent Labs  Lab 07/08/18 0319  WBC 26.9*  HGB 9.0*  HCT 28.6*  PLT 265   ------------------------------------------------------------------------------------------------------------------  Chemistries  Recent Labs  Lab 07/06/18 0543 07/07/18 0433  NA 140 142  K 3.4* 3.4*  CL 110 110  CO2 24 24  GLUCOSE 154* 156*  BUN 48* 50*  CREATININE 1.67* 1.55*  CALCIUM 8.4* 8.2*  MG 2.3  --   AST 33  --   ALT 41  --   ALKPHOS 54  --   BILITOT 0.6  --     ------------------------------------------------------------------------------------------------------------------  Cardiac Enzymes No results for input(s): TROPONINI in the last 168 hours. ------------------------------------------------------------------------------------------------------------------  RADIOLOGY:  No results found.   ASSESSMENT AND PLAN:   85 year old male with a history of PAD CAD, PAF and chronic kidney disease stage III who presented for lower extremity angiogram and found to have acute respiratory distress and atrial fibrillation.  1.  Acute hypoxic respiratory failure in the setting of atrial fibrillation with RVR and pneumonia: Patient weaned to [place] from bipap Wean steroids as tolerated.  He has been treated for pneumonia and off of antibiotics.  2.  Atrial fibrillation with RVR, normal sinus rhythm: Cardiology consultation appreciated Continue diltiazem and amiodarone Continue heparin drip for now  3.  PAD with leg claudication: Patient is status post angiogram which is notable for nonsalvageable limb. Family  leaning towards comfort care as leg is unsalvageable and would require amputation and he is at high risk for surgery given cardiopulmonary status..   Follow-up on palliative care recommendations this afternoon  4.  Chronic kidney disease stage III with stable creatinine 5.  Hypokalemia: Replete PRN  6.  Hypothyroidism with decreased TSH: T4 is normal  7.  Dementia: Continue donepezil  Critically ill.  Overall poor prognosis. Palliative care to meet with family again today to discuss goals of care Patient would most benefit from hospice  CODE STATUS: DNR  TOTAL TIME TAKING CARE OF THIS PATIENT: 22 minutes.     POSSIBLE D/C ??, DEPENDING ON CLINICAL  CONDITION.   Aryka Coonradt M.D on 07/08/2018 at 10:26 AM  Between 7am to 6pm - Pager - [phone] After 6pm go to [email or web address] - password [email or web address]  [facility]  Office   [phone]  CC: Primary care physician; Kerman Passey, MD  Note: This dictation was prepared with Dragon dictation along with smaller phrase technology. Any transcriptional errors that result from this process are unintentional.

## 2018-07-09 DIAGNOSIS — E440 Moderate protein-calorie malnutrition: Secondary | ICD-10-CM

## 2018-07-09 LAB — CBC
HCT: 27 % — ABNORMAL LOW (ref 39.0–52.0)
HEMOGLOBIN: 8.5 g/dL — AB (ref 13.0–17.0)
MCH: 23.2 pg — ABNORMAL LOW (ref 26.0–34.0)
MCHC: 31.5 g/dL (ref 30.0–36.0)
MCV: 73.8 fL — ABNORMAL LOW (ref 80.0–100.0)
Platelets: 249 10*3/uL (ref 150–400)
RBC: 3.66 MIL/uL — ABNORMAL LOW (ref 4.22–5.81)
RDW: 16.5 % — ABNORMAL HIGH (ref 11.5–15.5)
WBC: 22.8 10*3/uL — ABNORMAL HIGH (ref 4.0–10.5)
nRBC: 0 % (ref 0.0–0.2)

## 2018-07-09 LAB — HEPARIN LEVEL (UNFRACTIONATED): Heparin Unfractionated: 0.34 IU/mL (ref 0.30–0.70)

## 2018-07-09 NOTE — Progress Notes (Signed)
ANTICOAGULATION CONSULT NOTE  Pharmacy Consult for Heparin  Indication: arterial occlusion  Patient Measurements: Height: 5\' 7"  (170.2 cm) Weight: 151 lb 10.8 oz (68.8 kg) IBW/kg (Calculated) : 66.1 Heparin Dosing Weight:  68.9 kg   Vital Signs: Temp: 98.2 F (36.8 C) (01/09 0519) Temp Source: Oral (01/09 0519) BP: 166/62 (01/09 0519) Pulse Rate: 66 (01/09 0519)  Labs: Recent Labs    07/07/18 0433  07/07/18 2200 07/08/18 0319 07/09/18 0329  HGB 9.0*  --   --  9.0* 8.5*  HCT 29.0*  --   --  28.6* 27.0*  PLT 267  --   --  265 249  HEPARINUNFRC 0.83*   < > 0.45 0.47 0.34  CREATININE 1.55*  --   --   --   --    < > = values in this interval not displayed.    Estimated Creatinine Clearance: 30.8 mL/min (A) (by C-G formula based on SCr of 1.55 mg/dL (H)).   Medical History: Past Medical History:  Diagnosis Date  . Arthritis   . CKD (chronic kidney disease) stage 3, GFR 30-59 ml/min (HCC) 12/29/2015  . Dry gangrene (HCC) 06/16/2017   Great toe and 5th toe LEFT foot; Dr. Wyn Quaker  . Hyperlipidemia   . Hypertension   . Left leg DVT (HCC) 01/2014  . Myocardial infarction (HCC)   . Pulmonary embolism (HCC) 01/2014  . Thoracic aorta atherosclerosis (HCC) 10/08/2016    Medications:  Medications Prior to Admission  Medication Sig Dispense Refill Last Dose  . atenolol (TENORMIN) 50 MG tablet TAKE 1 TABLET (50 MG TOTAL) BY MOUTH DAILY. 90 tablet 1 06/27/2018 at 0800  . atorvastatin (LIPITOR) 10 MG tablet TAKE 1 TABLET BY MOUTH EVERYDAY AT BEDTIME 90 tablet 1 06/27/2018 at 2000  . Cholecalciferol (VITAMIN D-1000 MAX ST) 1000 units tablet Take 5 tablets by mouth daily.   06/27/2018 at 0800  . clopidogrel (PLAVIX) 75 MG tablet Take 1 tablet (75 mg total) by mouth daily. Use INSTEAD of Eliquis 30 tablet 0 06/28/2018 at 0800  . donepezil (ARICEPT) 5 MG tablet Take 1 tablet by mouth every evening.  6 06/27/2018 at 2000  . loratadine (CLARITIN) 10 MG tablet Take 1 tablet (10 mg total) by  mouth daily as needed for allergies.   06/27/2018 at 0800  . Omega-3 Fatty Acids (FISH OIL PO) Take 1 capsule by mouth 2 (two) times daily.   06/27/2018 at 0800  . triamcinolone lotion (KENALOG) 0.1 % Apply 1 application topically 3 (three) times daily. 120 mL 1 06/27/2018 at 0800  . vitamin B-12 (CYANOCOBALAMIN) 1000 MCG tablet Take 1 tablet by mouth daily.   06/27/2018 at 0800  . albuterol (PROVENTIL HFA;VENTOLIN HFA) 108 (90 Base) MCG/ACT inhaler Inhale 2 puffs into the lungs every 4 (four) hours as needed for wheezing or shortness of breath. (Patient not taking: Reported on 06/28/2018) 1 Inhaler 1 Not Taking at Unknown time    Assessment: Pharmacy consulted to dose heparin in this 85 year old male with occluded artery. Heparin rate decreased around 2300 on 1/4 to 1000 units/hr.   01/05 Anti-Xa level therapeutic x 2. Heparin infusion continued at 1000 units/hr.   01/07 AM HL 0.83. Heparin infusion decreased to 850 units/hr  Goal of Therapy:  Heparin level 0.3-0.7 units/ml Monitor platelets by anticoagulation protocol: Yes   Plan:  01/07 @ 1421 HL: 0.50. Level is therapeutic x1. Will decrease to 850 units/hr and recheck in 8 hours  1/7 PM heparin level 0.45. Continue current  regimen. Recheck heparin level and CBC with tomorrow AM labs.  1/8 AM heparin level 0.47. Continue current regimen. Recheck heparin level and CBC with tomorrow AM labs  1/9 AM heparin level 0.34. Continue current regimen. Recheck heparin level and CBC with tomorrow AM labs  Erich Montane, PharmD, BCPS Clinical Pharmacist 07/09/2018 6:03 AM

## 2018-07-09 NOTE — Progress Notes (Addendum)
[facility] - [place] at [facility]   PATIENT NAME: [name] [name]    MR#:  [id number]  DATE OF BIRTH:  [birth year]  SUBJECTIVE:  Patient lethargic  this morning  Nurse at bedside  REVIEW OF SYSTEMS:    Patient is lethargic unable to obtain review of system       DRUG ALLERGIES:   Allergies  Allergen Reactions  . Aspirin Anaphylaxis, Swelling and Shortness Of Breath    REACTION: terrible bp reaction/wheezing  . Bee Pollen Other (See Comments)    ragweed  . Pollen Extract     ragweed  . Rabbit Epithelium     rabbits    VITALS:  Blood pressure (!) 124/53, pulse 63, temperature 98.2 F (36.8 C), temperature source Oral, resp. rate 20, height 5\' 7"  (1.702 m), weight 68.8 kg, SpO2 99 %.  PHYSICAL EXAMINATION:  Constitutional: Appears frail and critically ill-appearing no distress. HENT: Normocephalic. Marland Kitchen. BiPAP  eyes: Conjunctivae and EOM are normal. PERRLA, no scleral icterus.  Neck:  Neck supple. No JVD. No tracheal deviation. CVS regular regular rate and rhythm S1/S2 +, 2/6 murmurs, no gallops, no carotid bruit.  Pulmonary: bilateral wheezing/rhonchi Abdominal: Soft. BS +,  no distension, tenderness, rebound or guarding.   Neuro: Disoriented  Left leg is cold unable to palpate pulse  LABORATORY PANEL:   CBC Recent Labs  Lab 07/09/18 0329  WBC 22.8*  HGB 8.5*  HCT 27.0*  PLT 249   ------------------------------------------------------------------------------------------------------------------  Chemistries  Recent Labs  Lab 07/06/18 0543 07/07/18 0433  NA 140 142  K 3.4* 3.4*  CL 110 110  CO2 24 24  GLUCOSE 154* 156*  BUN 48* 50*  CREATININE 1.67* 1.55*  CALCIUM 8.4* 8.2*  MG 2.3  --   AST 33  --   ALT 41  --   ALKPHOS 54  --   BILITOT 0.6  --    ------------------------------------------------------------------------------------------------------------------  Cardiac Enzymes No results for input(s): TROPONINI in the  last 168 hours. ------------------------------------------------------------------------------------------------------------------  RADIOLOGY:  No results found.   ASSESSMENT AND PLAN:   85 year old male with a history of PAD CAD, PAF and chronic kidney disease stage III who presented for lower extremity angiogram and found to have acute respiratory distress and atrial fibrillation.  1.  Acute hypoxic respiratory failure in the setting of atrial fibrillation with RVR and pneumonia: Patient weaned to [place] from bipap Wean steroids as tolerated.  He has been treated for pneumonia and off of antibiotics.  2.  Atrial fibrillation with RVR, normal sinus rhythm: Cardiology consultation appreciated Continue diltiazem and amiodarone Continue heparin drip for now  3.  PAD with leg claudication: Patient is status post angiogram which is notable for nonsalvageable limb. Family  leaning towards comfort care as leg is unsalvageable and would require amputation and he is at high risk for surgery given cardiopulmonary status.. Family refused for surgery as per the discussion with the palliative care today.  Family members would like to have a discussion before they consider hospice care.  Palliative care is actively following  4.  Chronic kidney disease stage III with stable creatinine  5.  Hypokalemia: Replete PRN  6.  Hypothyroidism with decreased TSH: T4 is normal  7.  Moderate malnutrition-dietary supplements as tolerated  8.  Dementia: Continue donepezil  Critically ill.  Overall poor prognosis. Palliative care to meet with family again today to discuss goals of care Patient would most benefit from hospice  CODE STATUS: DNR  TOTAL TIME TAKING CARE  OF THIS PATIENT: .     POSSIBLE D/C ??, DEPENDING ON CLINICAL CONDITION.   Ramonita Lab M.D on 07/09/2018 at 1:58 PM  Between 7am to 6pm - Pager - [phone]  After 6pm go to [email or web address] - password [email or web address]  [facility]  Office  [phone]  CC: Primary care physician; Kerman Passey, MD  Note: This dictation was prepared with Dragon dictation along with smaller phrase technology. Any transcriptional errors that result from this process are unintentional.

## 2018-07-09 NOTE — Progress Notes (Signed)
Palliative:  I met today at bedside again with [name] [name]. No family at bedside today. I discussed again with [name] [name] today regarding his leg and amputation but he continues with confusion and unable to understand to make a decision for himself.   I called and left a voicemail for daughter, [name] [name], who is a Therapist, sports. Family are discussing options but have not made any final decisions. I do not believe they desire amputation. They were discussing hospice options at home vs hospice facility. Not full comfort care yet.   Exam: Alert, confused. Breathing more labored than yesterday and rhonchi clears with cough but transient improvement since he does not expectorate.   Plan: - Await final decisions from family.   20 min  Vinie Sill, NP Palliative Medicine Team Pager # [phone] (M-F 8a-5p) Team Phone # [phone] (Nights/Weekends)

## 2018-07-10 DIAGNOSIS — Z515 Encounter for palliative care: Secondary | ICD-10-CM

## 2018-07-10 DIAGNOSIS — Z7189 Other specified counseling: Secondary | ICD-10-CM

## 2018-07-10 LAB — HEPARIN LEVEL (UNFRACTIONATED): Heparin Unfractionated: 0.21 IU/mL — ABNORMAL LOW (ref 0.30–0.70)

## 2018-07-10 LAB — CBC
HCT: 31.4 % — ABNORMAL LOW (ref 39.0–52.0)
Hemoglobin: 9.4 g/dL — ABNORMAL LOW (ref 13.0–17.0)
MCH: 23.3 pg — ABNORMAL LOW (ref 26.0–34.0)
MCHC: 29.9 g/dL — ABNORMAL LOW (ref 30.0–36.0)
MCV: 77.7 fL — ABNORMAL LOW (ref 80.0–100.0)
NRBC: 0 % (ref 0.0–0.2)
PLATELETS: 229 10*3/uL (ref 150–400)
RBC: 4.04 MIL/uL — ABNORMAL LOW (ref 4.22–5.81)
RDW: 17.3 % — ABNORMAL HIGH (ref 11.5–15.5)
WBC: 21.3 10*3/uL — ABNORMAL HIGH (ref 4.0–10.5)

## 2018-07-10 MED ORDER — HALOPERIDOL LACTATE 5 MG/ML IJ SOLN
0.5000 mg | INTRAMUSCULAR | Status: DC | PRN
  Filled 2018-07-10: qty 0.1

## 2018-07-10 MED ORDER — GLYCOPYRROLATE 0.2 MG/ML IJ SOLN
0.2000 mg | INTRAMUSCULAR | Status: DC | PRN
  Filled 2018-07-10: qty 1

## 2018-07-10 MED ORDER — HEPARIN BOLUS VIA INFUSION
1000.0000 [IU] | Freq: Once | INTRAVENOUS | Status: AC
  Administered 2018-07-10: 1000 [IU] via INTRAVENOUS
  Filled 2018-07-10: qty 1000

## 2018-07-10 MED ORDER — LORAZEPAM 2 MG/ML PO CONC
1.0000 mg | ORAL | Status: DC | PRN
  Filled 2018-07-10: qty 0.5

## 2018-07-10 MED ORDER — MORPHINE SULFATE (CONCENTRATE) 10 MG/0.5ML PO SOLN
5.0000 mg | ORAL | Status: DC | PRN
  Administered 2018-07-10: 5 mg via SUBLINGUAL
  Filled 2018-07-10: qty 1

## 2018-07-10 MED ORDER — LORAZEPAM 2 MG/ML IJ SOLN: 1.0000 mg | INTRAMUSCULAR | Status: DC | PRN

## 2018-07-10 MED ORDER — HALOPERIDOL 0.5 MG PO TABS
0.5000 mg | ORAL_TABLET | ORAL | Status: DC | PRN
  Filled 2018-07-10: qty 1

## 2018-07-10 MED ORDER — HALOPERIDOL LACTATE 2 MG/ML PO CONC
0.5000 mg | ORAL | Status: DC | PRN
  Filled 2018-07-10: qty 0.3

## 2018-07-10 MED ORDER — LORAZEPAM 1 MG PO TABS
1.0000 mg | ORAL_TABLET | ORAL | Status: DC | PRN
  Administered 2018-07-13: 1 mg via ORAL
  Filled 2018-07-10: qty 1

## 2018-07-10 MED ORDER — MORPHINE SULFATE (CONCENTRATE) 10 MG/0.5ML PO SOLN: 5.0000 mg | ORAL | Status: DC | PRN

## 2018-07-10 MED ORDER — MORPHINE SULFATE (PF) 2 MG/ML IV SOLN
2.0000 mg | INTRAVENOUS | Status: DC | PRN
  Administered 2018-07-11 – 2018-07-13 (×9): 2 mg via INTRAVENOUS
  Filled 2018-07-10 (×9): qty 1

## 2018-07-10 MED ORDER — GLYCOPYRROLATE 1 MG PO TABS
1.0000 mg | ORAL_TABLET | ORAL | Status: DC | PRN
  Filled 2018-07-10: qty 1

## 2018-07-10 NOTE — Progress Notes (Signed)
[facility] [place] [facility]   PATIENT NAME: [name] [name]    MR#:  [id number]  DATE OF BIRTH:  [birth year]  SUBJECTIVE:  Patient is awake and alert but pleasantly confused    REVIEW OF SYSTEMS:    Patient is lethargic unable to obtain review of system       DRUG ALLERGIES:   Allergies  Allergen Reactions  . Aspirin Anaphylaxis, Swelling and Shortness Of Breath    REACTION: terrible bp reaction/wheezing  . Bee Pollen Other (See Comments)    ragweed  . Pollen Extract     ragweed  . Rabbit Epithelium     rabbits    VITALS:  Blood pressure (!) 166/65, pulse 88, temperature 98.1 F (36.7 C), temperature source Oral, resp. rate (!) 24, height 5\' 7"  (1.702 m), weight 60.7 kg, SpO2 97 %.  PHYSICAL EXAMINATION:  Constitutional: Appears frail and critically ill-appearing no distress. HENT: Normocephalic. Marland Kitchen BiPAP  eyes: Conjunctivae and EOM are normal. PERRLA, no scleral icterus.  Neck:  Neck supple. No JVD. No tracheal deviation. CVS regular regular rate and rhythm S1/S2 +, 2/6 murmurs, no gallops, no carotid bruit.  Pulmonary: bilateral wheezing/rhonchi Abdominal: Soft. BS +,  no distension, tenderness, rebound or guarding.   Neuro: Awake and alert, pleasant confusion  Left leg is cold unable to palpate pulse  LABORATORY PANEL:   CBC Recent Labs  Lab 07/10/18 0507  WBC 21.3*  HGB 9.4*  HCT 31.4*  PLT 229   ------------------------------------------------------------------------------------------------------------------  Chemistries  Recent Labs  Lab 07/06/18 0543 07/07/18 0433  NA 140 142  K 3.4* 3.4*  CL 110 110  CO2 24 24  GLUCOSE 154* 156*  BUN 48* 50*  CREATININE 1.67* 1.55*  CALCIUM 8.4* 8.2*  MG 2.3  --   AST 33  --   ALT 41  --   ALKPHOS 54  --   BILITOT 0.6  --    ------------------------------------------------------------------------------------------------------------------  Cardiac Enzymes No results for  input(s): TROPONINI in the last 168 hours. ------------------------------------------------------------------------------------------------------------------  RADIOLOGY:  No results found.   ASSESSMENT AND PLAN:   85 year old male with a history of PAD CAD, PAF and chronic kidney disease stage III who presented for lower extremity angiogram and found to have acute respiratory distress and atrial fibrillation.  1.  Acute hypoxic respiratory failure in the setting of atrial fibrillation with RVR and pneumonia: Patient weaned to [place] from bipap   2.  Atrial fibrillation with RVR, normal sinus rhythm: Cardiology consultation appreciated  3.  PAD with leg claudication: Patient is status post angiogram which is notable for nonsalvageable limb.  4.  Chronic kidney disease stage III   5.  Hypokalemia:   6.  Hypothyroidism   7.  Moderate malnutrition-  8.  Dementia:  Critically ill.  Overall poor prognosis. Palliative care had another discussion with the family members and change the patient to comfort care.  Transfer to hospice home when bed is available Comfort care orders placed  CODE STATUS: DNR  TOTAL TIME TAKING CARE OF THIS PATIENT: .     POSSIBLE D/C ??, DEPENDING ON CLINICAL CONDITION.   Ramonita Lab M.D on 07/10/2018 at 3:56 PM  Between 7am to 6pm - Pager - [phone]  After 6pm go to [email or web address] - password [employer]  [facility]  Office  [phone]  CC: Primary care physician; Kerman Passey, MD  Note: This dictation was prepared with Dragon dictation along with smaller phrase technology. Any transcriptional errors  that result from this process are unintentional.

## 2018-07-10 NOTE — Clinical Social Work Note (Addendum)
Clinical Social Work Assessment  Patient Details  Name: [name] [name] MRN: [id number] Date of Birth: [birth year]  Date of referral:  07/10/18               Reason for consult:  End of Life/Hospice, Discharge Planning                Permission sought to share information with:    Permission granted to share information::     Name::        Agency::     Relationship::     Contact Information:     Housing/Transportation Living arrangements for the past 2 months:  Single Family Home Source of Information:  Adult Children, Spouse Patient Interpreter Needed:  None Criminal Activity/Legal Involvement Pertinent to Current Situation/Hospitalization:    Significant Relationships:  Adult Children, Spouse Lives with:  Spouse Do you feel safe going back to the place where you live?    Need for family participation in patient care:  Yes (Comment)  Care giving concerns:  Patient resides with his wife at baseline.    Social Worker assessment / plan:  Palliative Care informed CSW that patient's family has chosen for patient to go to hospice home. Patient could not hear CSW and so CSW contacted patient's wife. Patient's wife asked CSW to call Alisa Graff: [phone]. CSW was able to reach [place] and she confirmed that the decision is for the hospice home and believes the family wants [facility] [place]. She asked that I call her sister: Maricela Curet: [phone]. CSW contacted Terri and she confirmed the family wanted [facility]. CSW made referral to Clydie Braun with [facility]. Curently Clydie Braun states patient is on a waiting list and has left a message for family.   Employment status:    Insurance information:    PT Recommendations:    Information / Referral to community resources:     Patient/Family's Response to care:  Family expressed appreciation for CSW assistance.   Patient/Family's Understanding of and Emotional Response to Diagnosis, Current Treatment, and  Prognosis:  Patient's family were all in agreement with patient's wishes of wanting to go to hospice home.  Emotional Assessment Appearance:  Appears stated age Attitude/Demeanor/Rapport:    Affect (typically observed):    Orientation:    Alcohol / Substance use:  Not Applicable Psych involvement (Current and /or in the community):  No (Comment)  Discharge Needs  Concerns to be addressed:  Care Coordination Readmission within the last 30 days:  No Current discharge risk:  None Barriers to Discharge:  No Barriers Identified   York Spaniel, LCSW 07/10/2018, 1:41 PM

## 2018-07-10 NOTE — Care Management Important Message (Signed)
Copy of signed Medicare IM left with patient in room. 

## 2018-07-10 NOTE — Progress Notes (Signed)
Patient's daughter [name] called and wanted the patient to be prescribed Eloquis even though he will be going to hospice and comfort care. Nurse notified Palliative NP Shae and notified her. Per Fabian November " I will call her".

## 2018-07-10 NOTE — Progress Notes (Signed)
Nurse attempted to assist patient with dinner feeding, patient refused dinner and stated " I just want something to drink and then to go out to sleep". Nurse assisted patient with drinking tea from dinner tray by holding the glass for patent. He took a couple of sips and then laid his head back to go back to sleep. Nurse asked patient again prior to leaving room  " are you sure you don't want anything else to drink" and patient again stated " no".

## 2018-07-10 NOTE — Progress Notes (Signed)
Daily Progress Note   Patient Name: [name] [name]       Date: 07/10/2018 DOB: Jan 05, 1928  Age: 85 y.o. MRN#: [id number] Attending Physician: Nicholes Mango, MD Primary Care Physician: Arnetha Courser, MD Admit Date: 06/28/2018  Reason for Consultation/Follow-up: Establishing goals of care and Hospice Evaluation  Subjective: Patient sitting up in bed - small bites of breakfast, confused. Tells me "I feel lousy"  Length of Stay: 12  Current Medications: Scheduled Meds:  . amiodarone  400 mg Oral Daily  . budesonide (PULMICORT) nebulizer solution  0.5 mg Nebulization BID  . cholecalciferol  5,000 Units Oral Daily  . clopidogrel  75 mg Oral Daily  . donepezil  5 mg Oral QPM  . furosemide  40 mg Intravenous Once  . ipratropium-albuterol  3 mL Nebulization TID  . methylPREDNISolone (SOLU-MEDROL) injection  40 mg Intravenous Q12H  . metoprolol tartrate  25 mg Oral BID  . omega-3 acid ethyl esters  1 g Oral Daily  . senna-docusate  1 tablet Oral BID  . sodium chloride flush  10-40 mL Intracatheter Q12H  . sodium chloride flush  3 mL Intravenous Q12H  . triamcinolone cream  1 application Topical TID  . vitamin B-12  1,000 mcg Oral Daily    Continuous Infusions: . sodium chloride    . albuterol 10 mg/hr (07/06/18 0801)  . dexmedetomidine (PRECEDEX) IV infusion Stopped (07/06/18 2228)  . diltiazem (CARDIZEM) infusion Stopped (07/03/18 1244)  . heparin 1,000 Units/hr (07/10/18 0956)    PRN Meds: sodium chloride, acetaminophen **OR** acetaminophen, guaiFENesin, hydrALAZINE, levalbuterol, lip balm, loratadine, morphine injection, ondansetron **OR** ondansetron (ZOFRAN) IV, oxyCODONE  Physical Exam Constitutional:      General: He is not in acute distress.    Appearance: He is not diaphoretic.    HENT:     Head: Normocephalic and atraumatic.  Cardiovascular:     Rate and Rhythm: Normal rate and regular rhythm.  Pulmonary:     Effort: Pulmonary effort is normal.     Breath sounds: Normal breath sounds.  Musculoskeletal:     Left lower leg: He exhibits swelling.     Comments: Discolored, cool LLE  Skin:    General: Skin is warm and dry.  Neurological:     Mental Status: He is alert. He is confused.  Psychiatric:        Cognition and Memory: Cognition is impaired. Memory is impaired.             Vital Signs: BP (!) 166/65   Pulse 88   Temp 98.1 F (36.7 C) (Oral)   Resp (!) 24   Ht _0  (1.702 m)   Wt 68.8 kg   SpO2 97%   BMI 23.76 kg/m  SpO2: SpO2: 97 % O2 Device: O2 Device: Room Air O2 Flow Rate: O2 Flow Rate (L/min): 3 L/min  Intake/output summary:   Intake/Output Summary (Last 24 hours) at 07/10/2018 1023 Last data filed at 07/10/2018 0658 Gross per 24 hour  Intake 307.25 ml  Output 850 ml  Net -542.75 ml   LBM: Last BM Date: 07/09/18 Baseline Weight: Weight: 68.9 kg Most recent weight: Weight: 68.8 kg       Palliative Assessment/Data:  PPS 30%    Flowsheet Rows     Most Recent Value  Intake Tab  Referral Department  Hospitalist  Unit at Time of Referral  ICU  Palliative Care Primary Diagnosis  Cardiac  Date Notified  07/04/18  Palliative Care Type  New Palliative care  Reason for referral  Clarify Goals of Care  Date of Admission  06/28/18  # of days IP prior to Palliative referral  6  Clinical Assessment  Psychosocial & Spiritual Assessment  Palliative Care Outcomes      Patient Active Problem List   Diagnosis Date Noted  . Malnutrition of moderate degree 07/09/2018  . Ischemic leg   . PAF (paroxysmal atrial fibrillation) ([place]) 07/03/2018  . Acute respiratory distress 07/03/2018  . PVD (peripheral vascular disease) ([place]) 06/28/2018  . Poor posture 03/24/2018  . History of rectal bleeding 01/09/2018  . Peripheral vascular disease  ([place]) 08/14/2017  . Left anterior knee pain 08/14/2017  . Mild dementia ([place]) 08/06/2017  . Cognitive impairment 07/21/2017  . Cyclic citrullinated peptide (CCP) antibody positive 06/18/2017  . Rheumatoid factor positive 06/18/2017  . Dry gangrene ([place]) 06/16/2017  . Atherosclerosis of native arteries of the extremities with gangrene ([place]) 06/13/2017  . CAD (coronary artery disease), native coronary artery 05/24/2017  . Arterial occlusion 05/23/2017  . Osteoarthritis of knee 05/09/2017  . Mild cognitive impairment 11/27/2016  . Mood disorder ([place]) 11/27/2016  . Vitamin B12 deficiency 11/27/2016  . Dermatitis 10/08/2016  . Thoracic aorta atherosclerosis ([place]) 10/08/2016  . CKD (chronic kidney disease) stage 3, GFR 30-59 ml/min (HCC) 12/29/2015  . Anemia 12/29/2015  . Medication monitoring encounter 12/29/2015  . Presbycusis 12/29/2015  . Cough 07/05/2015  . History of anticoagulant therapy 01/16/2015  . History of pulmonary embolus (PE) 01/16/2015  . Glaucoma 01/16/2015  . H/O malignant neoplasm of skin 01/16/2015  . Mixed hyperlipidemia 01/16/2015  . Essential hypertension 01/16/2015  . Hypertensive heart/kidney disease without HF and with CKD stage III ([place]) 01/16/2015  . Calculus of kidney 01/16/2015  . Personal history of DVT (deep vein thrombosis) 01/16/2015  . Presence of stent in coronary artery 01/16/2015  . Pruritic dermatitis 01/16/2015    Palliative Care Assessment & Plan   HPI: 85 y.o. 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 Hospitalization complicated by rapid atrial fibrillation and severe dyspnea requiring BiPAP and ICU stay. Has weaned off BiPAP 07/06/18 with plans for potential angiogram. Angiogram was completed 07/06/18 and now faced with decision for amputation L AKA  vs comfort care. PMT consulted for [place].  Assessment: Follow up with patient and family today. This family has met with PMT provider Elmo Putt, NP several times this week and discussed patient's care at length. They were leaning away from amputation and wanting to focus on his comfort but had not made a final decision. Patient has 4 children - 3 daughters and 1 son. His wife has dementia; therefore, his children serve as his decision makers. Per previous conversations, family felt that their father would not want an amputation or have to rely on others for ADLs - they also understood this surgery would be very risky for the patient.   Today, I spoke with 2 daughters: Karna Christmas and Judeen Hammans - both share that as a family they have decided to  focus on [name]'s comfort and not pursue amputation. They desire hospice facility placement as they understand Gershon Mussel is quickly nearing the end of life and they do not feel they can provide the level of care he needs at home. We discussed transitioning him to comfort care while he remains in the hospital awaiting a hospice bed - discussed discontinuing medications that do not promote comfort and utilizing medications as needed for symptom relief. Terri and Judeen Hammans both agree this is how they would like to proceed with [name]'s care.  All questions addressed and family was encouraged to call with questions or concerns. Emotional support provided.   Recommendations/Plan:  Transition to comfort care - no amputation - symptom management, discontinue medications and orders that do not promote comfort  Hospice facility placement  Goals of Care and Additional Recommendations:  Limitations on Scope of Treatment: Full Comfort Care  Code Status:  DNR  Prognosis:   < 2 weeks  Discharge Planning:  Hospice facility  Care plan was discussed with Patient's daughter's Terri and Judeen Hammans, social work, Therapist, sports  Thank you for allowing the Palliative Medicine Team to assist in the care of this  patient.   Total Time 40 minutes Prolonged Time Billed  no       Greater than 50%  of this time was spent counseling and coordinating care related to the above assessment and plan.  Juel Burrow, DNP, [facility] Palliative Medicine Team Team Phone # [phone]  Pager [phone]

## 2018-07-10 NOTE — Progress Notes (Addendum)
New hospice home referral received from CSW [facility] following a Palliative Medicine follow up visit. Monica made aware that there are currently no beds available today. Writer spoke via telephone with patient's daughter Barth Kirks to advise of current wait list and process for weekend hospice home admission if bed becomes available. Patient information faxed to referral. Dayna Barker RN, BSN, [facility] [place], hospital Liaison [phone]

## 2018-07-10 NOTE — Progress Notes (Signed)
[facility]  Daily Progress Note   Vascular Surgery Progress Note  Patient's family have chosen not to pursue amputation. Transitioning to hospice care. Vascular surgery will sign off at this time. Please reconsult if needed.    Discussed with Dr. Wallis Mart Nyajah Hyson PA-C 07/10/2018 3:11 PM

## 2018-07-10 NOTE — Plan of Care (Signed)
Patient has been placed on comfort care at this time, he remains having pain and some urine output with poor oral intake.

## 2018-07-10 NOTE — Progress Notes (Signed)
ANTICOAGULATION CONSULT NOTE  Pharmacy Consult for Heparin  Indication: arterial occlusion  Patient Measurements: Height: 5\' 7"  (170.2 cm) Weight: 151 lb 10.8 oz (68.8 kg) IBW/kg (Calculated) : 66.1 Heparin Dosing Weight:  68.9 kg   Vital Signs: Temp: 98.1 F (36.7 C) (01/10 0433) Temp Source: Oral (01/10 0433) BP: 181/68 (01/10 0433) Pulse Rate: 71 (01/10 0433)  Labs: Recent Labs    07/08/18 0319 07/09/18 0329 07/10/18 0507  HGB 9.0* 8.5* 9.4*  HCT 28.6* 27.0* 31.4*  PLT 265 249 229  HEPARINUNFRC 0.47 0.34 0.21*    Estimated Creatinine Clearance: 30.8 mL/min (A) (by C-G formula based on SCr of 1.55 mg/dL (H)).   Medical History: Past Medical History:  Diagnosis Date  . Arthritis   . CKD (chronic kidney disease) stage 3, GFR 30-59 ml/min (HCC) 12/29/2015  . Dry gangrene (HCC) 06/16/2017   Great toe and 5th toe LEFT foot; Dr. Wyn Quaker  . Hyperlipidemia   . Hypertension   . Left leg DVT (HCC) 01/2014  . Myocardial infarction (HCC)   . Pulmonary embolism (HCC) 01/2014  . Thoracic aorta atherosclerosis (HCC) 10/08/2016    Medications:  Medications Prior to Admission  Medication Sig Dispense Refill Last Dose  . atenolol (TENORMIN) 50 MG tablet TAKE 1 TABLET (50 MG TOTAL) BY MOUTH DAILY. 90 tablet 1 06/27/2018 at 0800  . atorvastatin (LIPITOR) 10 MG tablet TAKE 1 TABLET BY MOUTH EVERYDAY AT BEDTIME 90 tablet 1 06/27/2018 at 2000  . Cholecalciferol (VITAMIN D-1000 MAX ST) 1000 units tablet Take 5 tablets by mouth daily.   06/27/2018 at 0800  . clopidogrel (PLAVIX) 75 MG tablet Take 1 tablet (75 mg total) by mouth daily. Use INSTEAD of Eliquis 30 tablet 0 06/28/2018 at 0800  . donepezil (ARICEPT) 5 MG tablet Take 1 tablet by mouth every evening.  6 06/27/2018 at 2000  . loratadine (CLARITIN) 10 MG tablet Take 1 tablet (10 mg total) by mouth daily as needed for allergies.   06/27/2018 at 0800  . Omega-3 Fatty Acids (FISH OIL PO) Take 1 capsule by mouth 2 (two) times daily.    06/27/2018 at 0800  . triamcinolone lotion (KENALOG) 0.1 % Apply 1 application topically 3 (three) times daily. 120 mL 1 06/27/2018 at 0800  . vitamin B-12 (CYANOCOBALAMIN) 1000 MCG tablet Take 1 tablet by mouth daily.   06/27/2018 at 0800  . albuterol (PROVENTIL HFA;VENTOLIN HFA) 108 (90 Base) MCG/ACT inhaler Inhale 2 puffs into the lungs every 4 (four) hours as needed for wheezing or shortness of breath. (Patient not taking: Reported on 06/28/2018) 1 Inhaler 1 Not Taking at Unknown time    Assessment: Pharmacy consulted to dose heparin in this 85 year old male with occluded artery. Heparin rate decreased around 2300 on 1/4 to 1000 units/hr.   01/05 Anti-Xa level therapeutic x 2. Heparin infusion continued at 1000 units/hr.   01/07 AM HL 0.83. Heparin infusion decreased to 850 units/hr  Goal of Therapy:  Heparin level 0.3-0.7 units/ml Monitor platelets by anticoagulation protocol: Yes   Plan:  01/07 @ 1421 HL: 0.50. Level is therapeutic x1. Will decrease to 850 units/hr and recheck in 8 hours  1/7 PM heparin level 0.45. Continue current regimen. Recheck heparin level and CBC with tomorrow AM labs.  1/8 AM heparin level 0.47. Continue current regimen. Recheck heparin level and CBC with tomorrow AM labs  1/9 AM heparin level 0.34. Continue current regimen. Recheck heparin level and CBC with tomorrow AM labs  1/10 AM heparin level 0.21.  1000 unit bolus and increase rate to 1000 units/hr. Recheck in 8 hours.  Erich Montane, PharmD, BCPS Clinical Pharmacist 07/10/2018 6:59 AM

## 2018-07-11 NOTE — Clinical Social Work Note (Signed)
CSW received confirmation that this patient remains on the waiting list for the [facility]. The CSW is following for possible discharge once a bed is available.  [name] [name], MSW, [name] [phone]

## 2018-07-11 NOTE — Progress Notes (Signed)
[facility] [place] [facility]   PATIENT NAME: [name] [name]    MR#:  [id number]  DATE OF BIRTH:  Nov 19, 1927  SUBJECTIVE:  Patient is resting comfortably , no family at bedside     REVIEW OF SYSTEMS:    Patient is lethargic unable to obtain review of system       DRUG ALLERGIES:   Allergies  Allergen Reactions  . Aspirin Anaphylaxis, Swelling and Shortness Of Breath    REACTION: terrible bp reaction/wheezing  . Bee Pollen Other (See Comments)    ragweed  . Pollen Extract     ragweed  . Rabbit Epithelium     rabbits    VITALS:  Blood pressure (!) 166/65, pulse 88, temperature 98.1 F (36.7 C), temperature source Oral, resp. rate (!) 24, height 5\' 7"  (1.702 m), weight 60.7 kg, SpO2 97 %.  PHYSICAL EXAMINATION:  Constitutional: Appears frail and critically ill-appearing no distress. HENT: Normocephalic. Marland Kitchen BiPAP  eyes: Conjunctivae and EOM are normal. PERRLA, no scleral icterus.  Neck:  Neck supple. No JVD. No tracheal deviation. CVS regular regular rate Pulmonary: bilateral wheezing/rhonchi Abdominal: Soft. BS +,  no distension, tenderness, rebound or guarding.   Neuro: Awake and alert, pleasant confusion  Left leg is cold unable to palpate pulse  LABORATORY PANEL:   CBC Recent Labs  Lab 07/10/18 0507  WBC 21.3*  HGB 9.4*  HCT 31.4*  PLT 229   ------------------------------------------------------------------------------------------------------------------  Chemistries  Recent Labs  Lab 07/06/18 0543 07/07/18 0433  NA 140 142  K 3.4* 3.4*  CL 110 110  CO2 24 24  GLUCOSE 154* 156*  BUN 48* 50*  CREATININE 1.67* 1.55*  CALCIUM 8.4* 8.2*  MG 2.3  --   AST 33  --   ALT 41  --   ALKPHOS 54  --   BILITOT 0.6  --    ------------------------------------------------------------------------------------------------------------------  Cardiac Enzymes No results for input(s): TROPONINI in the last 168  hours. ------------------------------------------------------------------------------------------------------------------  RADIOLOGY:  No results found.   ASSESSMENT AND PLAN:   85 year old male with a history of PAD CAD, PAF and chronic kidney disease stage III who presented for lower extremity angiogram and found to have acute respiratory distress and atrial fibrillation.  1.  Acute hypoxic respiratory failure in the setting of atrial fibrillation with RVR and pneumonia: Patient weaned to [place] from bipap   2.  Atrial fibrillation with RVR, normal sinus rhythm: Cardiology consultation appreciated  3.  PAD with leg claudication: Patient is status post angiogram which is notable for nonsalvageable limb.  4.  Chronic kidney disease stage III   5.  Hypokalemia:   6.  Hypothyroidism   7.  Moderate malnutrition-  8.  Dementia:  Critically ill.  Overall poor prognosis. Palliative care had another discussion with the family members and change the patient to comfort care.  Transfer to hospice home when bed is available.  Follow-up with Child psychotherapist Comfort care orders placed  CODE STATUS: DNR  TOTAL TIME TAKING CARE OF THIS PATIENT: .     POSSIBLE D/C ??, DEPENDING ON CLINICAL CONDITION.   Ramonita Lab M.D on 07/11/2018 at 3:08 PM  Between 7am to 6pm - Pager - [phone]  After 6pm go to [email or web address] - password [employer]  [facility] [place] [facility]  Office  [phone]  CC: Primary care physician; Kerman Passey, MD  Note: This dictation was prepared with Dragon dictation along with smaller phrase technology. Any transcriptional errors that result from this process  are unintentional.

## 2018-07-12 MED ORDER — GUAIFENESIN 100 MG/5ML PO SOLN: 5.0000 mL | ORAL | 0 refills | Status: AC | PRN

## 2018-07-12 MED ORDER — GLYCOPYRROLATE 1 MG PO TABS: 1.0000 mg | ORAL_TABLET | ORAL | Status: AC | PRN

## 2018-07-12 MED ORDER — MORPHINE SULFATE (CONCENTRATE) 10 MG/0.5ML PO SOLN: 5.0000 mg | ORAL | Status: AC | PRN

## 2018-07-12 MED ORDER — ACETAMINOPHEN 325 MG PO TABS: 650.0000 mg | ORAL_TABLET | Freq: Four times a day (QID) | ORAL | Status: AC | PRN

## 2018-07-12 MED ORDER — HALOPERIDOL 0.5 MG PO TABS: 0.5000 mg | ORAL_TABLET | ORAL | Status: AC | PRN

## 2018-07-12 MED ORDER — LORAZEPAM 1 MG PO TABS: 1.0000 mg | ORAL_TABLET | ORAL | 0 refills | Status: AC | PRN

## 2018-07-12 MED ORDER — ONDANSETRON HCL 4 MG PO TABS: 4.0000 mg | ORAL_TABLET | Freq: Four times a day (QID) | ORAL | 0 refills | Status: AC | PRN

## 2018-07-12 MED ORDER — BLISTEX MEDICATED EX OINT: 1.0000 "application " | TOPICAL_OINTMENT | CUTANEOUS | Status: AC | PRN

## 2018-07-12 MED ORDER — MORPHINE SULFATE (PF) 2 MG/ML IV SOLN: 2.0000 mg | INTRAVENOUS | 0 refills | Status: AC | PRN

## 2018-07-12 NOTE — Discharge Summary (Signed)
[facility] - [place] at [facility]   PATIENT NAME: [name] [name]    MR#:  [id number]  DATE OF BIRTH:  Jul 15, 1927  DATE OF ADMISSION:  06/28/2018 ADMITTING PHYSICIAN: Auburn Bilberry, MD  DATE OF DISCHARGE: July 12, 2018 PRIMARY CARE PHYSICIAN: Kerman Passey, MD    ADMISSION DIAGNOSIS:  Ischemic leg [I99.8] Non-traumatic rhabdomyolysis [M62.82]  DISCHARGE DIAGNOSIS:  Active Problems:   PVD (peripheral vascular disease) (HCC)   PAF (paroxysmal atrial fibrillation) (HCC)   Acute respiratory distress   Ischemic leg   Malnutrition of moderate degree   Goals of care, counseling/discussion   Comfort measures only status   Palliative care by specialist   SECONDARY DIAGNOSIS:   Past Medical History:  Diagnosis Date  . Arthritis   . CKD (chronic kidney disease) stage 3, GFR 30-59 ml/min (HCC) 12/29/2015  . Dry gangrene (HCC) 06/16/2017   Great toe and 5th toe LEFT foot; Dr. Wyn Quaker  . Hyperlipidemia   . Hypertension   . Left leg DVT (HCC) 01/2014  . Myocardial infarction (HCC)   . Pulmonary embolism (HCC) 01/2014  . Thoracic aorta atherosclerosis (HCC) 10/08/2016    HOSPITAL COURSE:   85 year old male with a history of PAD CAD, PAF and chronic kidney disease stage III who presented for lower extremity angiogram and found to have acute respiratory distress and atrial fibrillation.  1.  Acute hypoxic respiratory failure in the setting of atrial fibrillation with RVR and pneumonia: Patient weaned to [place] from bipap   2.  Atrial fibrillation with RVR, normal sinus rhythm: Cardiology consultation appreciated  3.  PAD with leg claudication: Patient is status post angiogram which is notable for nonsalvageable limb.  4.  Chronic kidney disease stage III   5.  Hypokalemia:   6.  Hypothyroidism   7.  Moderate malnutrition-  8.  Dementia:  Critically ill.  Overall poor prognosis. Palliative care had  discussion with the family members and  change the patient to comfort care.  Transfer to hospice homeComfort care orders placed DISCHARGE CONDITIONS:   Guarded  CONSULTS OBTAINED:     PROCEDURES none  DRUG ALLERGIES:   Allergies  Allergen Reactions  . Aspirin Anaphylaxis, Swelling and Shortness Of Breath    REACTION: terrible bp reaction/wheezing  . Bee Pollen Other (See Comments)    ragweed  . Pollen Extract     ragweed  . Rabbit Epithelium     rabbits    DISCHARGE MEDICATIONS:   Allergies as of 07/12/2018      Reactions   Aspirin Anaphylaxis, Swelling, Shortness Of Breath   REACTION: terrible bp reaction/wheezing   Bee Pollen Other (See Comments)   ragweed   Pollen Extract    ragweed   Rabbit Epithelium    rabbits      Medication List    STOP taking these medications   albuterol 108 (90 Base) MCG/ACT inhaler Commonly known as:  PROVENTIL HFA;VENTOLIN HFA   atenolol 50 MG tablet Commonly known as:  TENORMIN   atorvastatin 10 MG tablet Commonly known as:  LIPITOR   CLARITIN 10 MG tablet Generic drug:  loratadine   clopidogrel 75 MG tablet Commonly known as:  PLAVIX   donepezil 5 MG tablet Commonly known as:  ARICEPT   FISH OIL PO   triamcinolone lotion 0.1 % Commonly known as:  KENALOG   vitamin B-12 1000 MCG tablet Commonly known as:  CYANOCOBALAMIN   VITAMIN D-1000 MAX ST 25 MCG (1000 UT) tablet Generic drug:  Cholecalciferol     TAKE these medications   acetaminophen 325 MG tablet Commonly known as:  TYLENOL Take 2 tablets (650 mg total) by mouth every 6 (six) hours as needed for mild pain (or Fever >/= 101).   glycopyrrolate 1 MG tablet Commonly known as:  ROBINUL Take 1 tablet (1 mg total) by mouth every 4 (four) hours as needed (excessive secretions).   guaiFENesin 100 MG/5ML Soln Commonly known as:  ROBITUSSIN Take 5 mLs (100 mg total) by mouth every 4 (four) hours as needed for cough or to loosen phlegm.   haloperidol 0.5 MG tablet Commonly known as:  HALDOL Take  1 tablet (0.5 mg total) by mouth every 4 (four) hours as needed for agitation (or delirium).   lip balm Oint Apply 1 application topically as needed for lip care.   LORazepam 1 MG tablet Commonly known as:  ATIVAN Take 1 tablet (1 mg total) by mouth every 4 (four) hours as needed for anxiety.   morphine CONCENTRATE 10 MG/0.5ML Soln concentrated solution Take 0.25 mLs (5 mg total) by mouth every 2 (two) hours as needed for moderate pain (or dyspnea).   morphine 2 MG/ML injection Inject 1 mL (2 mg total) into the vein every 2 (two) hours as needed (or dyspnea).   ondansetron 4 MG tablet Commonly known as:  ZOFRAN Take 1 tablet (4 mg total) by mouth every 6 (six) hours as needed for nausea.        DISCHARGE INSTRUCTIONS:  Transfer patient to hospice home   DIET:  Regular diet as tolerated  DISCHARGE CONDITION:  Guarded  ACTIVITY:  Bedrest  OXYGEN:  Home Oxygen: Yes.     Oxygen Delivery: 2 liters/min via as needed  DISCHARGE LOCATION:  Hospice home  If you experience worsening of your admission symptoms, develop shortness of breath, life threatening emergency, suicidal or homicidal thoughts you must seek medical attention immediately by calling 911 or calling your MD immediately  if symptoms less severe.  You Must read complete instructions/literature along with all the possible adverse reactions/side effects for all the Medicines you take and that have been prescribed to you. Take any new Medicines after you have completely understood and accpet all the possible adverse reactions/side effects.   Please note  You were cared for by a hospitalist during your hospital stay. If you have any questions about your discharge medications or the care you received while you were in the hospital after you are discharged, you can call the unit and asked to speak with the hospitalist on call if the hospitalist that took care of you is not available. Once you are discharged, your  primary care physician will handle any further medical issues. Please note that NO REFILLS for any discharge medications will be authorized once you are discharged, as it is imperative that you return to your primary care physician (or establish a relationship with a primary care physician if you do not have one) for your aftercare needs so that they can reassess your need for medications and monitor your lab values.     Today  Chief Complaint  Patient presents with  . Fall   Is lethargic but comfortable no family members at bedside  ROS: Unobtainable   VITAL SIGNS:  Blood pressure (!) 187/71, pulse 74, temperature 98.1 F (36.7 C), temperature source Oral, resp. rate 20, height 5\' 7"  (1.702 m), weight 60.7 kg, SpO2 96 %.  I/O:    Intake/Output Summary (Last 24 hours) at 07/12/2018  1108 Last data filed at 07/12/2018 0659 Gross per 24 hour  Intake -  Output 950 ml  Net -950 ml    PHYSICAL EXAMINATION:    Constitutional: Appears frail and critically ill-appearing no distress. HENT: Normocephalic.  eyes: no scleral icterus.  Neck:  Neck supple. No tracheal deviation. CVS regular regular rate Pulmonary: bilateral wheezing/rhonchi Abdominal: Soft. BS +,  no distension, tenderness, rebound or guarding.   Neuro: Awake and alert, pleasant confusion  Left leg is cold unable to palpate pulse  DATA REVIEW:   CBC Recent Labs  Lab 07/10/18 0507  WBC 21.3*  HGB 9.4*  HCT 31.4*  PLT 229    Chemistries  Recent Labs  Lab 07/06/18 0543 07/07/18 0433  NA 140 142  K 3.4* 3.4*  CL 110 110  CO2 24 24  GLUCOSE 154* 156*  BUN 48* 50*  CREATININE 1.67* 1.55*  CALCIUM 8.4* 8.2*  MG 2.3  --   AST 33  --   ALT 41  --   ALKPHOS 54  --   BILITOT 0.6  --     Cardiac Enzymes No results for input(s): TROPONINI in the last 168 hours.  Microbiology Results  Results for orders placed or performed during the hospital encounter of 06/28/18  Blood culture (routine x 2)      Status: Abnormal   Collection Time: 06/28/18  4:35 PM  Result Value Ref Range Status   Specimen Description   Final    BLOOD BLOOD RIGHT WRIST Performed at [facility], [address] [place], [place] [address]    Special Requests   Final    BOTTLES DRAWN AEROBIC AND ANAEROBIC Blood Culture adequate volume Performed at [facility], [address] [place], [place] [address]    Culture  Setup Time (A)  Final    GRAM VARIABLE ROD AEROBIC BOTTLE ONLY CRITICAL RESULT CALLED TO, READ BACK BY AND VERIFIED WITHErskine Emery: [facility]ANK [facility] AT 1415 06/29/18 SDR Performed at [facility], [address] [place], [place] [address]    Culture (A)  Final    BACILLUS SPECIES Standardized susceptibility testing for this organism is not available. Performed at [facility], [address] [place], [place] [address]    Report Status 07/01/2018 FINAL  Final  Blood culture (routine x 2)     Status: None   Collection Time: 06/28/18  4:51 PM  Result Value Ref Range Status   Specimen Description BLOOD RIGHT ANTECUBITAL  Final   Special Requests   Final    BOTTLES DRAWN AEROBIC AND ANAEROBIC Blood Culture adequate volume   Culture   Final    NO GROWTH 6 DAYS Performed at [facility], [address] [place], [place] [address]    Report Status 07/04/2018 FINAL  Final  MRSA PCR Screening     Status: None   Collection Time: 07/02/18 11:17 AM  Result Value Ref Range Status   MRSA by PCR NEGATIVE NEGATIVE Final    Comment:        The GeneXpert MRSA Assay (FDA approved for NASAL specimens only), is one component of a comprehensive MRSA colonization surveillance program. It is not intended to diagnose MRSA infection nor to guide or monitor treatment for MRSA infections. Performed at [facility], [address] [place], [place] [address]     RADIOLOGY:  No results found.  EKG:   Orders placed or performed during the hospital encounter of  06/28/18  . ED EKG  .  ED EKG  . EKG  . EKG 12-Lead  . EKG 12-Lead      CODE STATUS:     Code Status Orders  (From admission, onward)         Start     Ordered   07/10/18 1302  Do not attempt resuscitation (DNR)  Continuous    Question Answer Comment  In the event of cardiac or respiratory ARREST Do not call a "code blue"   In the event of cardiac or respiratory ARREST Do not perform Intubation, CPR, defibrillation or ACLS   In the event of cardiac or respiratory ARREST Use medication by any route, position, wound care, and other measures to relive pain and suffering. May use oxygen, suction and manual treatment of airway obstruction as needed for comfort.   Comments patient is alert and awake, no CPR, no vent as per patients wishes      07/10/18 1303        Code Status History    Date Active Date Inactive Code Status Order ID Comments User Context   07/03/2018 2258 07/10/2018 1303 DNR [id number]  Erin FullingKasa, Kurian, MD Inpatient   06/28/2018 1852 07/03/2018 2258 Full Code [id number]  Auburn BilberryPatel, Shreyang, MD Inpatient   11/10/2017 1217 11/10/2017 1717 Full Code [id number]  Annice Needyew, Jason S, MD Inpatient   05/24/2017 0015 05/28/2017 1728 Full Code [id number]  Ihor AustinPyreddy, Pavan, MD Inpatient    Advance Directive Documentation     Most Recent Value  Type of Advance Directive  Living will  Pre-existing out of facility DNR order (yellow form or pink MOST form)  -  "MOST" Form in Place?  -      TOTAL TIME TAKING CARE OF THIS PATIENT:  33 minutes.   Note: This dictation was prepared with Dragon dictation along with smaller phrase technology. Any transcriptional errors that result from this process are unintentional.   @[facility] @  on 07/12/2018 at 11:08 AM  Between 7am to 6pm - Pager - [phone]  After 6pm go to [email or web address] - password [email or web address] [facility]  [facility]  Office  [phone]  CC: Primary care physician; Kerman PasseyLada, Melinda P, MD

## 2018-07-12 NOTE — Clinical Social Work Note (Addendum)
The patient will discharge to [facility] via non-emergent EMS. The CSW has sent the discharge summary and will provide the discharge packet once [name] with Hospice confirms transport time. CSW will then sign off. Please consult should needs arise.  UPDATE: [name] has reached the patient's daughter, [name], who is unable to sign consents until tomorrow morning. [name] is attempting to contact another daughter who may be able to sign consents today. If no consents are signed, the patient cannot discharge until tomorrow once [name] is able to address the needs. The CSW will update as more information is available.  UPDATE: [name] family is over 3 hours away and will not be able to address admission documentation until 10 AM tomorrow. The hospice home will hold the bed for the patient, and the hospice liaison will complete the transfer. CSW will continue to follow and has updated the medical team.  [name], MSW, [name] [phone]

## 2018-07-13 NOTE — Progress Notes (Signed)
Follow up visit made to hospice home referral from 1/10. Patient seen lying in bed, does awaken to name, but returns to sleep quickly. Daughter Barth Kirks and wife at bedside. Patient with congested cough and increased respiratory rate of 24. Discussed with daughter Barth Kirks, and staff RN Alcario Drought notified. EMS notified for transport. Report called to the Hospice home. Hospital staff and family updated. Discharge summary faxed to referral, signed DNR in place. Dayna Barker RN, BSN, [facility] [place], hospital liaison [phone]

## 2018-07-13 NOTE — Progress Notes (Signed)
Patient sent out via ems with oxygen and picc in place.  Lower dentures in mouth and upper dentures in denture cup placed and bag and transported via ems

## 2018-07-13 NOTE — Discharge Summary (Signed)
[facility] [place] [facility]   PATIENT NAME: [name] [name]    MR#:  [id number]  DATE OF BIRTH:  Nov 19, 1927  DATE OF ADMISSION:  06/28/2018 ADMITTING PHYSICIAN: Auburn BilberryShreyang Patel, MD  DATE OF DISCHARGE: January 13 , 2020 PRIMARY CARE PHYSICIAN: Kerman PasseyLada, Melinda P, MD    ADMISSION DIAGNOSIS:  Ischemic leg [I99.8] Non-traumatic rhabdomyolysis [M62.82]  DISCHARGE DIAGNOSIS:  Active Problems:   PVD (peripheral vascular disease) (HCC)   PAF (paroxysmal atrial fibrillation) (HCC)   Acute respiratory distress   Ischemic leg   Malnutrition of moderate degree   Goals of care, counseling/discussion   Comfort measures only status   Palliative care by specialist   SECONDARY DIAGNOSIS:   Past Medical History:  Diagnosis Date  . Arthritis   . CKD (chronic kidney disease) stage 3, GFR 30-59 ml/min (HCC) 12/29/2015  . Dry gangrene (HCC) 06/16/2017   Great toe and 5th toe LEFT foot; Dr. Wyn Quakerew  . Hyperlipidemia   . Hypertension   . Left leg DVT (HCC) 01/2014  . Myocardial infarction (HCC)   . Pulmonary embolism (HCC) 01/2014  . Thoracic aorta atherosclerosis (HCC) 10/08/2016    HOSPITAL COURSE:   85 year old male with a history of PAD CAD, PAF and chronic kidney disease stage III who presented for lower extremity angiogram and found to have acute respiratory distress and atrial fibrillation.  1.  Acute hypoxic respiratory failure in the setting of atrial fibrillation with RVR and pneumonia: Patient weaned to  from bipap   2.  Atrial fibrillation with RVR, normal sinus rhythm: Cardiology  Seen pt   3.  PAD with leg claudication: Patient is status post angiogram which is notable for nonsalvageable limb.  4.  Chronic kidney disease stage III   5.  Hypokalemia:   6.  Hypothyroidism   7.  Moderate malnutrition-  8.  Dementia:  Critically ill.  Overall poor prognosis. Palliative care had  discussion with the family members and change the  patient to comfort care.  Transfer to hospice homeComfort care orders placed DISCHARGE CONDITIONS:   Guarded  CONSULTS OBTAINED:     PROCEDURES none  DRUG ALLERGIES:   Allergies  Allergen Reactions  . Aspirin Anaphylaxis, Swelling and Shortness Of Breath    REACTION: terrible bp reaction/wheezing  . Bee Pollen Other (See Comments)    ragweed  . Pollen Extract     ragweed  . Rabbit Epithelium     rabbits    DISCHARGE MEDICATIONS:   Allergies as of 07/13/2018      Reactions   Aspirin Anaphylaxis, Swelling, Shortness Of Breath   REACTION: terrible bp reaction/wheezing   Bee Pollen Other (See Comments)   ragweed   Pollen Extract    ragweed   Rabbit Epithelium    rabbits      Medication List    STOP taking these medications   albuterol 108 (90 Base) MCG/ACT inhaler Commonly known as:  PROVENTIL HFA;VENTOLIN HFA   atenolol 50 MG tablet Commonly known as:  TENORMIN   atorvastatin 10 MG tablet Commonly known as:  LIPITOR   CLARITIN 10 MG tablet Generic drug:  loratadine   clopidogrel 75 MG tablet Commonly known as:  PLAVIX   donepezil 5 MG tablet Commonly known as:  ARICEPT   FISH OIL PO   triamcinolone lotion 0.1 % Commonly known as:  KENALOG   vitamin B-12 1000 MCG tablet Commonly known as:  CYANOCOBALAMIN   VITAMIN D-1000 MAX ST 25 MCG (1000 UT) tablet  Generic drug:  Cholecalciferol     TAKE these medications   acetaminophen 325 MG tablet Commonly known as:  TYLENOL Take 2 tablets (650 mg total) by mouth every 6 (six) hours as needed for mild pain (or Fever >/= 101).   glycopyrrolate 1 MG tablet Commonly known as:  ROBINUL Take 1 tablet (1 mg total) by mouth every 4 (four) hours as needed (excessive secretions).   guaiFENesin 100 MG/5ML Soln Commonly known as:  ROBITUSSIN Take 5 mLs (100 mg total) by mouth every 4 (four) hours as needed for cough or to loosen phlegm.   haloperidol 0.5 MG tablet Commonly known as:  HALDOL Take 1 tablet  (0.5 mg total) by mouth every 4 (four) hours as needed for agitation (or delirium).   lip balm Oint Apply 1 application topically as needed for lip care.   LORazepam 1 MG tablet Commonly known as:  ATIVAN Take 1 tablet (1 mg total) by mouth every 4 (four) hours as needed for anxiety.   morphine CONCENTRATE 10 MG/0.5ML Soln concentrated solution Take 0.25 mLs (5 mg total) by mouth every 2 (two) hours as needed for moderate pain (or dyspnea).   morphine 2 MG/ML injection Inject 1 mL (2 mg total) into the vein every 2 (two) hours as needed (or dyspnea).   ondansetron 4 MG tablet Commonly known as:  ZOFRAN Take 1 tablet (4 mg total) by mouth every 6 (six) hours as needed for nausea.        DISCHARGE INSTRUCTIONS:  Transfer patient to hospice home   DIET:  Regular diet as tolerated  DISCHARGE CONDITION:  Guarded  ACTIVITY:  Bedrest  OXYGEN:  Home Oxygen: Yes.     Oxygen Delivery: 2 liters/min via as needed  DISCHARGE LOCATION:  Hospice home  If you experience worsening of your admission symptoms, develop shortness of breath, life threatening emergency, suicidal or homicidal thoughts you must seek medical attention immediately by calling 911 or calling your MD immediately  if symptoms less severe.  You Must read complete instructions/literature along with all the possible adverse reactions/side effects for all the Medicines you take and that have been prescribed to you. Take any new Medicines after you have completely understood and accpet all the possible adverse reactions/side effects.   Please note  You were cared for by a hospitalist during your hospital stay. If you have any questions about your discharge medications or the care you received while you were in the hospital after you are discharged, you can call the unit and asked to speak with the hospitalist on call if the hospitalist that took care of you is not available. Once you are discharged, your primary care  physician will handle any further medical issues. Please note that NO REFILLS for any discharge medications will be authorized once you are discharged, as it is imperative that you return to your primary care physician (or establish a relationship with a primary care physician if you do not have one) for your aftercare needs so that they can reassess your need for medications and monitor your lab values.     Today  Chief Complaint  Patient presents with  . Fall   Is lethargic but comfortable no family members at bedside  ROS: Unobtainable   VITAL SIGNS:  Blood pressure (!) 163/78, pulse 86, temperature 97.9 F (36.6 C), resp. rate 16, height 5\' 7"  (1.702 m), weight 60.7 kg, SpO2 96 %.  I/O:    Intake/Output Summary (Last 24 hours) at 07/13/2018  1008 Last data filed at 07/13/2018 0602 Gross per 24 hour  Intake -  Output 1425 ml  Net -1425 ml    PHYSICAL EXAMINATION:    Constitutional: Appears frail and critically ill-appearing no distress. HENT: Normocephalic.  eyes: no scleral icterus.  Neck:  Neck supple. No tracheal deviation. CVS regular regular rate Pulmonary: bilateral wheezing/rhonchi Abdominal: Soft. BS +,  no distension, tenderness, rebound or guarding.   Neuro: Awake and alert, pleasant confusion  Left leg is cold unable to palpate pulse  DATA REVIEW:   CBC Recent Labs  Lab 07/10/18 0507  WBC 21.3*  HGB 9.4*  HCT 31.4*  PLT 229    Chemistries  Recent Labs  Lab 07/07/18 0433  NA 142  K 3.4*  CL 110  CO2 24  GLUCOSE 156*  BUN 50*  CREATININE 1.55*  CALCIUM 8.2*    Cardiac Enzymes No results for input(s): TROPONINI in the last 168 hours.  Microbiology Results  Results for orders placed or performed during the hospital encounter of 06/28/18  Blood culture (routine x 2)     Status: Abnormal   Collection Time: 06/28/18  4:35 PM  Result Value Ref Range Status   Specimen Description   Final    BLOOD BLOOD RIGHT WRIST Performed at [facility], [address], [place], [place] [address]    Special Requests   Final    BOTTLES DRAWN AEROBIC AND ANAEROBIC Blood Culture adequate volume Performed at [facility], [address] [place], [place] [address]    Culture  Setup Time (A)  Final    GRAM VARIABLE ROD AEROBIC BOTTLE ONLY CRITICAL RESULT CALLED TO, READ BACK BY AND VERIFIED WITHErskine Emery [facility] AT 1415 06/29/18 SDR Performed at [facility], [address] [place], [place] [address]    Culture (A)  Final    BACILLUS SPECIES Standardized susceptibility testing for this organism is not available. Performed at [facility], [address] [place], [place] [address]    Report Status 07/01/2018 FINAL  Final  Blood culture (routine x 2)     Status: None   Collection Time: 06/28/18  4:51 PM  Result Value Ref Range Status   Specimen Description BLOOD RIGHT ANTECUBITAL  Final   Special Requests   Final    BOTTLES DRAWN AEROBIC AND ANAEROBIC Blood Culture adequate volume   Culture   Final    NO GROWTH 6 DAYS Performed at [facility], [address] [place], [place] [address]    Report Status 07/04/2018 FINAL  Final  MRSA PCR Screening     Status: None   Collection Time: 07/02/18 11:17 AM  Result Value Ref Range Status   MRSA by PCR NEGATIVE NEGATIVE Final    Comment:        The GeneXpert MRSA Assay (FDA approved for NASAL specimens only), is one component of a comprehensive MRSA colonization surveillance program. It is not intended to diagnose MRSA infection nor to guide or monitor treatment for MRSA infections. Performed at [facility], [address], [place], [place] [address]     RADIOLOGY:  No results found.  EKG:   Orders placed or performed during the hospital encounter of 06/28/18  . ED EKG  . ED EKG  . EKG  . EKG 12-Lead  . EKG 12-Lead      CODE STATUS:     Code Status Orders  (From admission, onward)  Start      Ordered   07/10/18 1302  Do not attempt resuscitation (DNR)  Continuous    Question Answer Comment  In the event of cardiac or respiratory ARREST Do not call a "code blue"   In the event of cardiac or respiratory ARREST Do not perform Intubation, CPR, defibrillation or ACLS   In the event of cardiac or respiratory ARREST Use medication by any route, position, wound care, and other measures to relive pain and suffering. May use oxygen, suction and manual treatment of airway obstruction as needed for comfort.   Comments patient is alert and awake, no CPR, no vent as per patients wishes      07/10/18 1303        Code Status History    Date Active Date Inactive Code Status Order ID Comments User Context   07/03/2018 2258 07/10/2018 1303 DNR [id number]  Erin Fulling, MD Inpatient   06/28/2018 1852 07/03/2018 2258 Full Code [id number]  Auburn Bilberry, MD Inpatient   11/10/2017 1217 11/10/2017 1717 Full Code [id number]  Annice Needy, MD Inpatient   05/24/2017 0015 05/28/2017 1728 Full Code [id number]  Ihor Austin, MD Inpatient    Advance Directive Documentation     Most Recent Value  Type of Advance Directive  Living will  Pre-existing out of facility DNR order (yellow form or pink MOST form)  -  "MOST" Form in Place?  -      TOTAL TIME TAKING CARE OF THIS PATIENT:  33 minutes.   Note: This dictation was prepared with Dragon dictation along with smaller phrase technology. Any transcriptional errors that result from this process are unintentional.   @MEC @  on 07/13/2018 at 10:08 AM  Between 7am to 6pm - Pager - [phone]  After 6pm go to [email or web address] - password [email or web address] [facility]  [facility]  Office  [phone]  CC: Primary care physician; Kerman Passey, MD

## 2018-07-13 NOTE — Clinical Social Work Note (Signed)
Patient discharging to hospice home today. Clydie Braun with hospice has facilitated the discharge. York Spaniel MSW,LCSW [phone]

## 2018-07-13 NOTE — Care Management Important Message (Signed)
Copy of signed Medicare IM left with patient in room. 

## 2018-07-17 ENCOUNTER — Other Ambulatory Visit: Payer: Self-pay | Admitting: Family Medicine

## 2018-08-01 DEATH — deceased

## 2018-09-03 ENCOUNTER — Ambulatory Visit: Payer: Medicare HMO | Admitting: Podiatry

## 2018-09-08 ENCOUNTER — Encounter (INDEPENDENT_AMBULATORY_CARE_PROVIDER_SITE_OTHER): Payer: Medicare HMO

## 2018-09-08 ENCOUNTER — Ambulatory Visit (INDEPENDENT_AMBULATORY_CARE_PROVIDER_SITE_OTHER): Payer: Medicare HMO | Admitting: Vascular Surgery

## 2018-11-17 ENCOUNTER — Ambulatory Visit: Payer: Medicare HMO | Admitting: Family Medicine

## 2019-01-20 ENCOUNTER — Ambulatory Visit: Payer: Medicare HMO | Admitting: Urology

## 2019-03-02 IMAGING — US US RENAL
1 series · 14 of 25 positions shown · non-contrast
Comparison: None.

CLINICAL DATA: 89-year-old male with UTI.  Initial encounter.

EXAM:
RENAL / URINARY TRACT ULTRASOUND COMPLETE

[Series 1: us renal · 0.23mm/px · 14 of 59 slices shown]
[im 1/59]
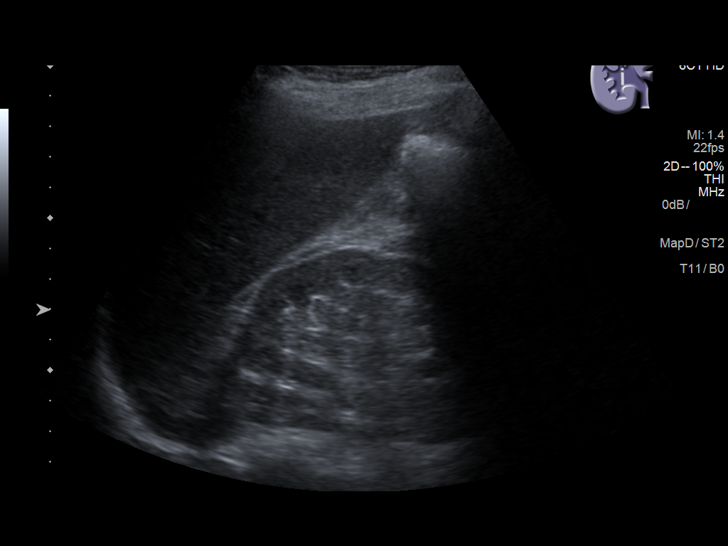
[im 5/59]
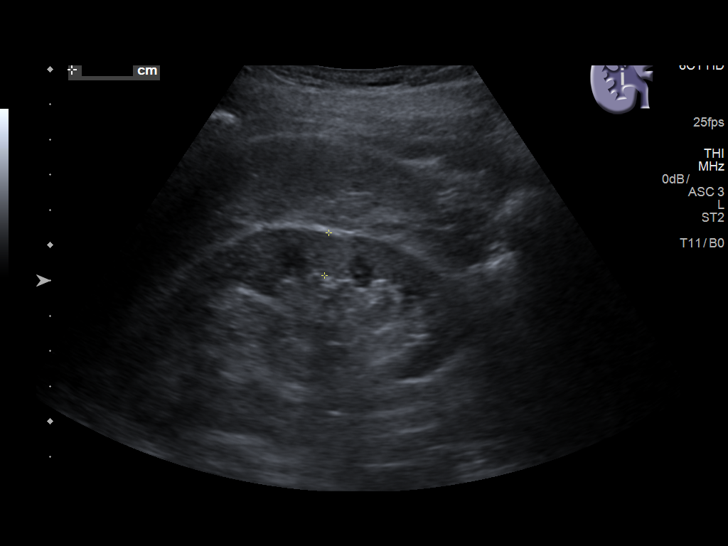
[im 10/59]
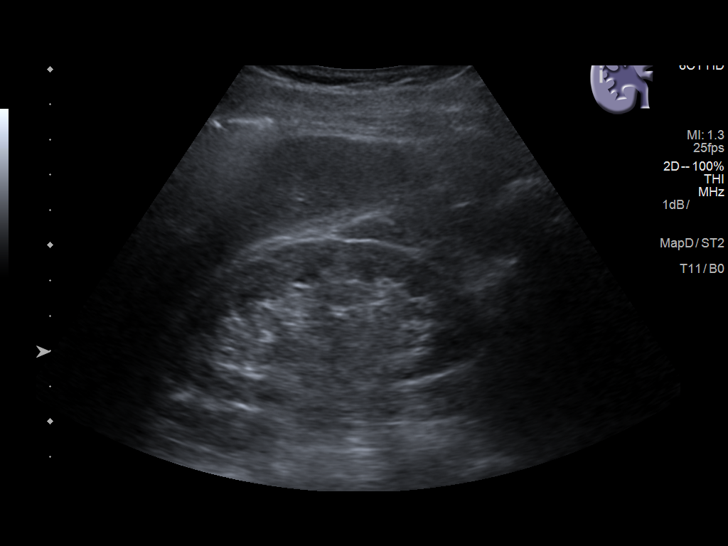
[im 15/59]
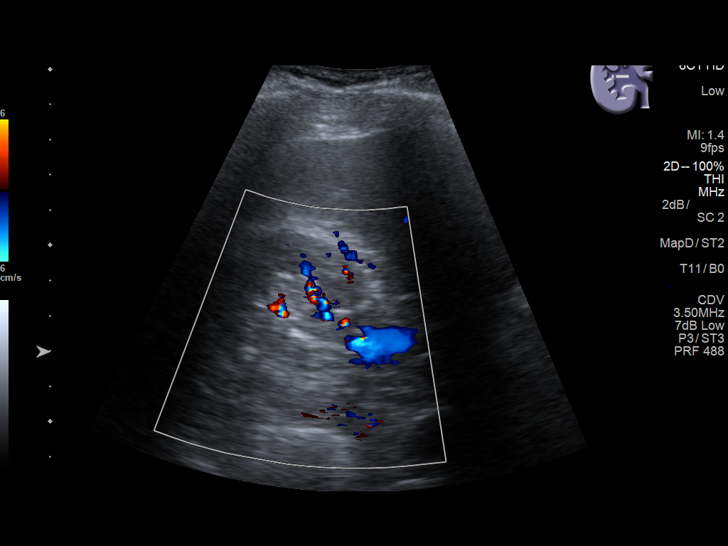
[im 20/59]
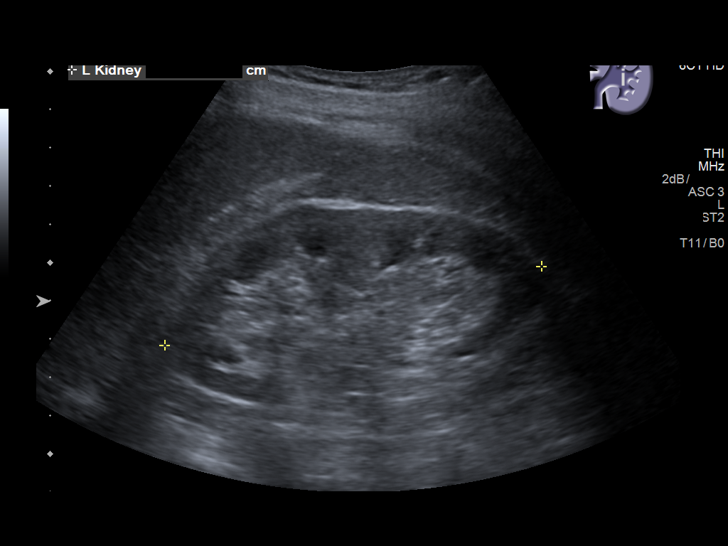
[im 22/59]
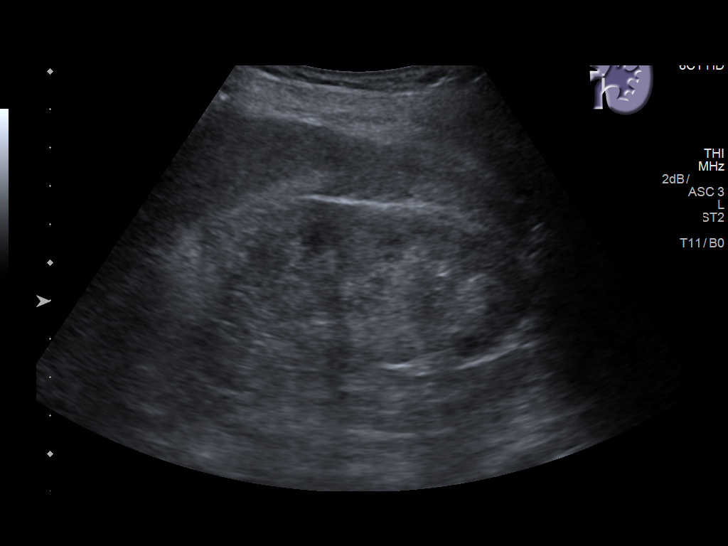
[im 27/59]
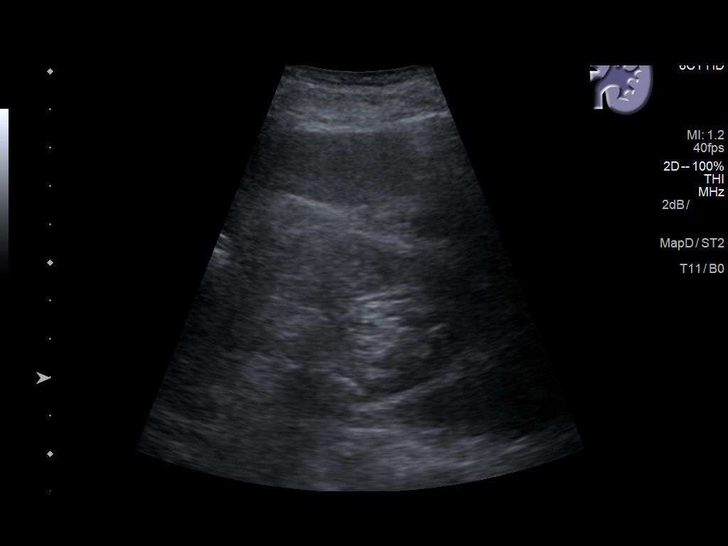
[im 32/59]
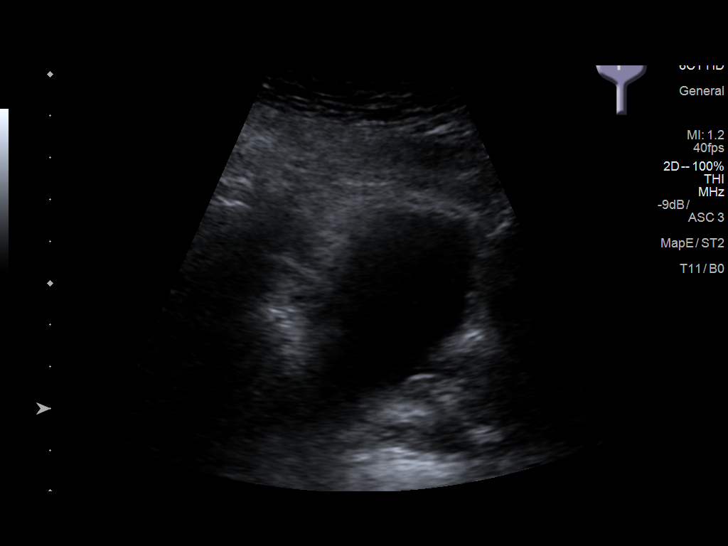
[im 37/59]
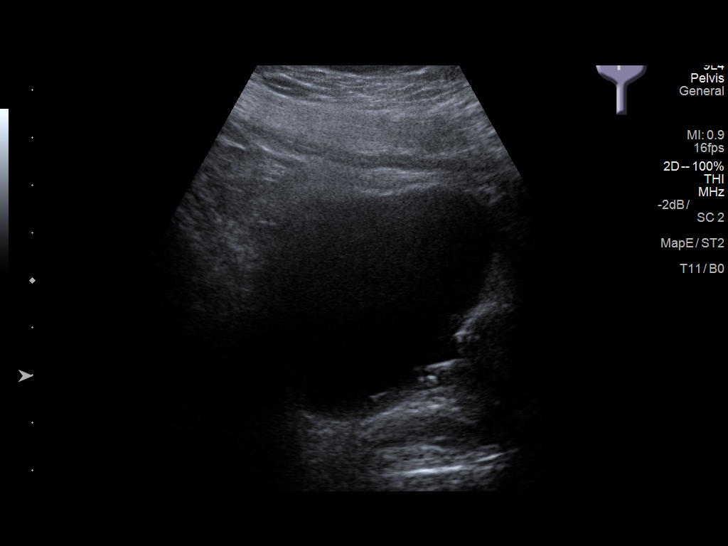
[im 39/59]
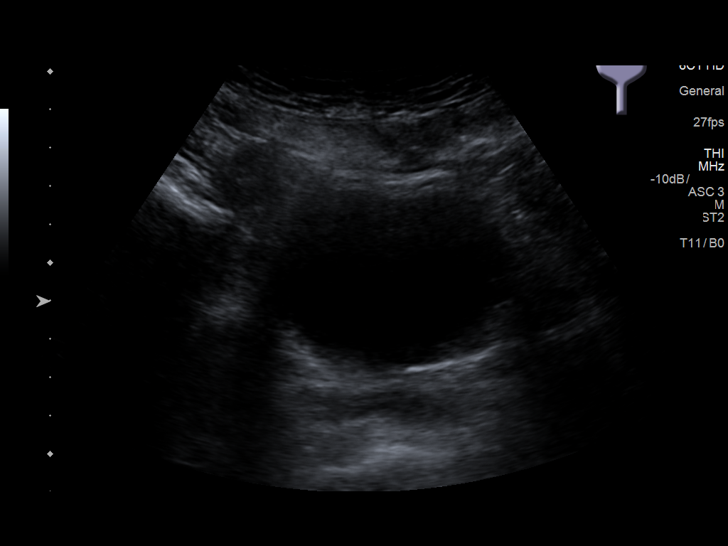
[im 44/59]
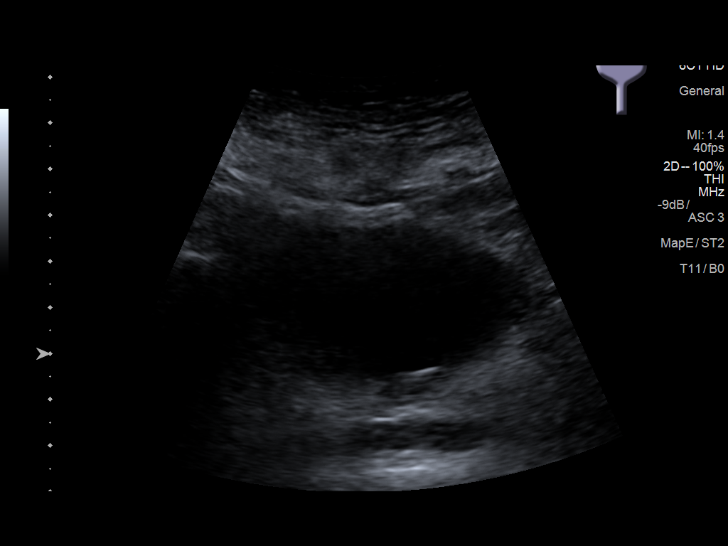
[im 49/59]
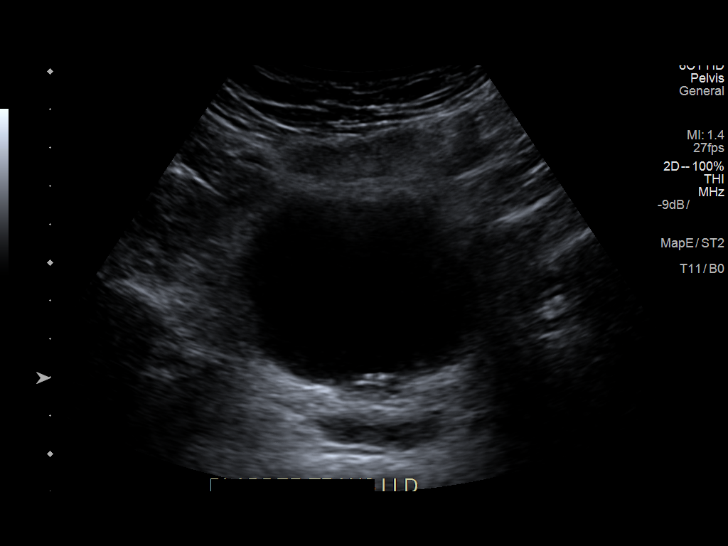
[im 54/59]
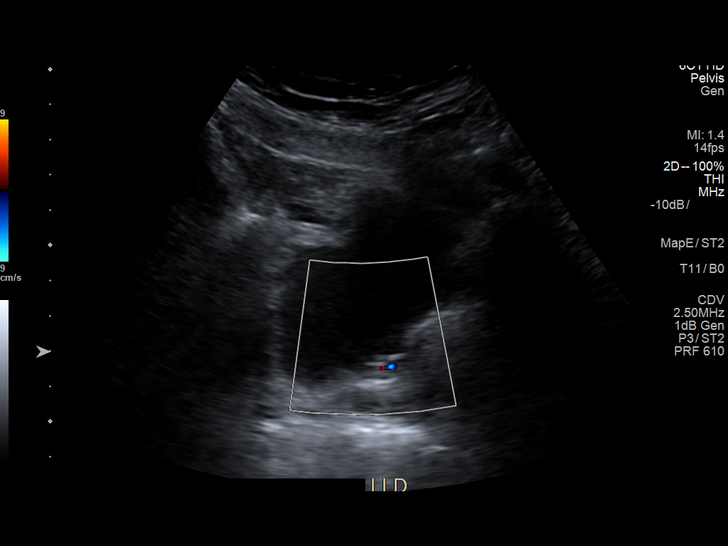
[im 59/59]
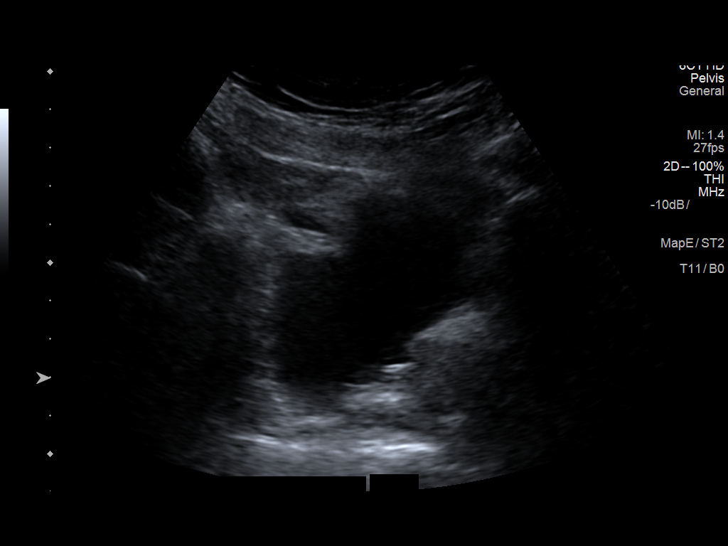

[14 of 25 positions shown; findings below may reference images not displayed]

FINDINGS: Right Kidney:

Length: 9.2 cm. Mild renal parenchymal thinning and minimal
increased echogenicity. No hydronephrosis or mass.

Left Kidney:

Length: 10.1 cm. Mild renal parenchymal thinning and minimal
increased echogenicity. No hydronephrosis or mass.

Bladder:

1 x 1.3 x 0.4 cm echogenic structure base of the bladder possibly
related to prostate gland. Primary bladder abnormality not excluded.
IMPRESSION: Mild renal parenchyma thinning and minimal increased echogenicity
bilaterally may reflect changes of medical renal disease. No
hydronephrosis.

1 x 1.3 x 0.4 cm echogenic structure base of the bladder possibly
related to prostate gland. Primary bladder abnormality not excluded.

## 2019-06-09 IMAGING — CR DG CHEST 2V
2 series · 2 of 2 positions shown · non-contrast
Comparison: PA and lateral chest x-ray June 14, 2017

CLINICAL DATA: Wheezing, hypoxia, former smoker. Previous MI.
Patient was hospitalized 2 days ago due to a fall.

EXAM:
CHEST - 2 VIEW

[chest lat]
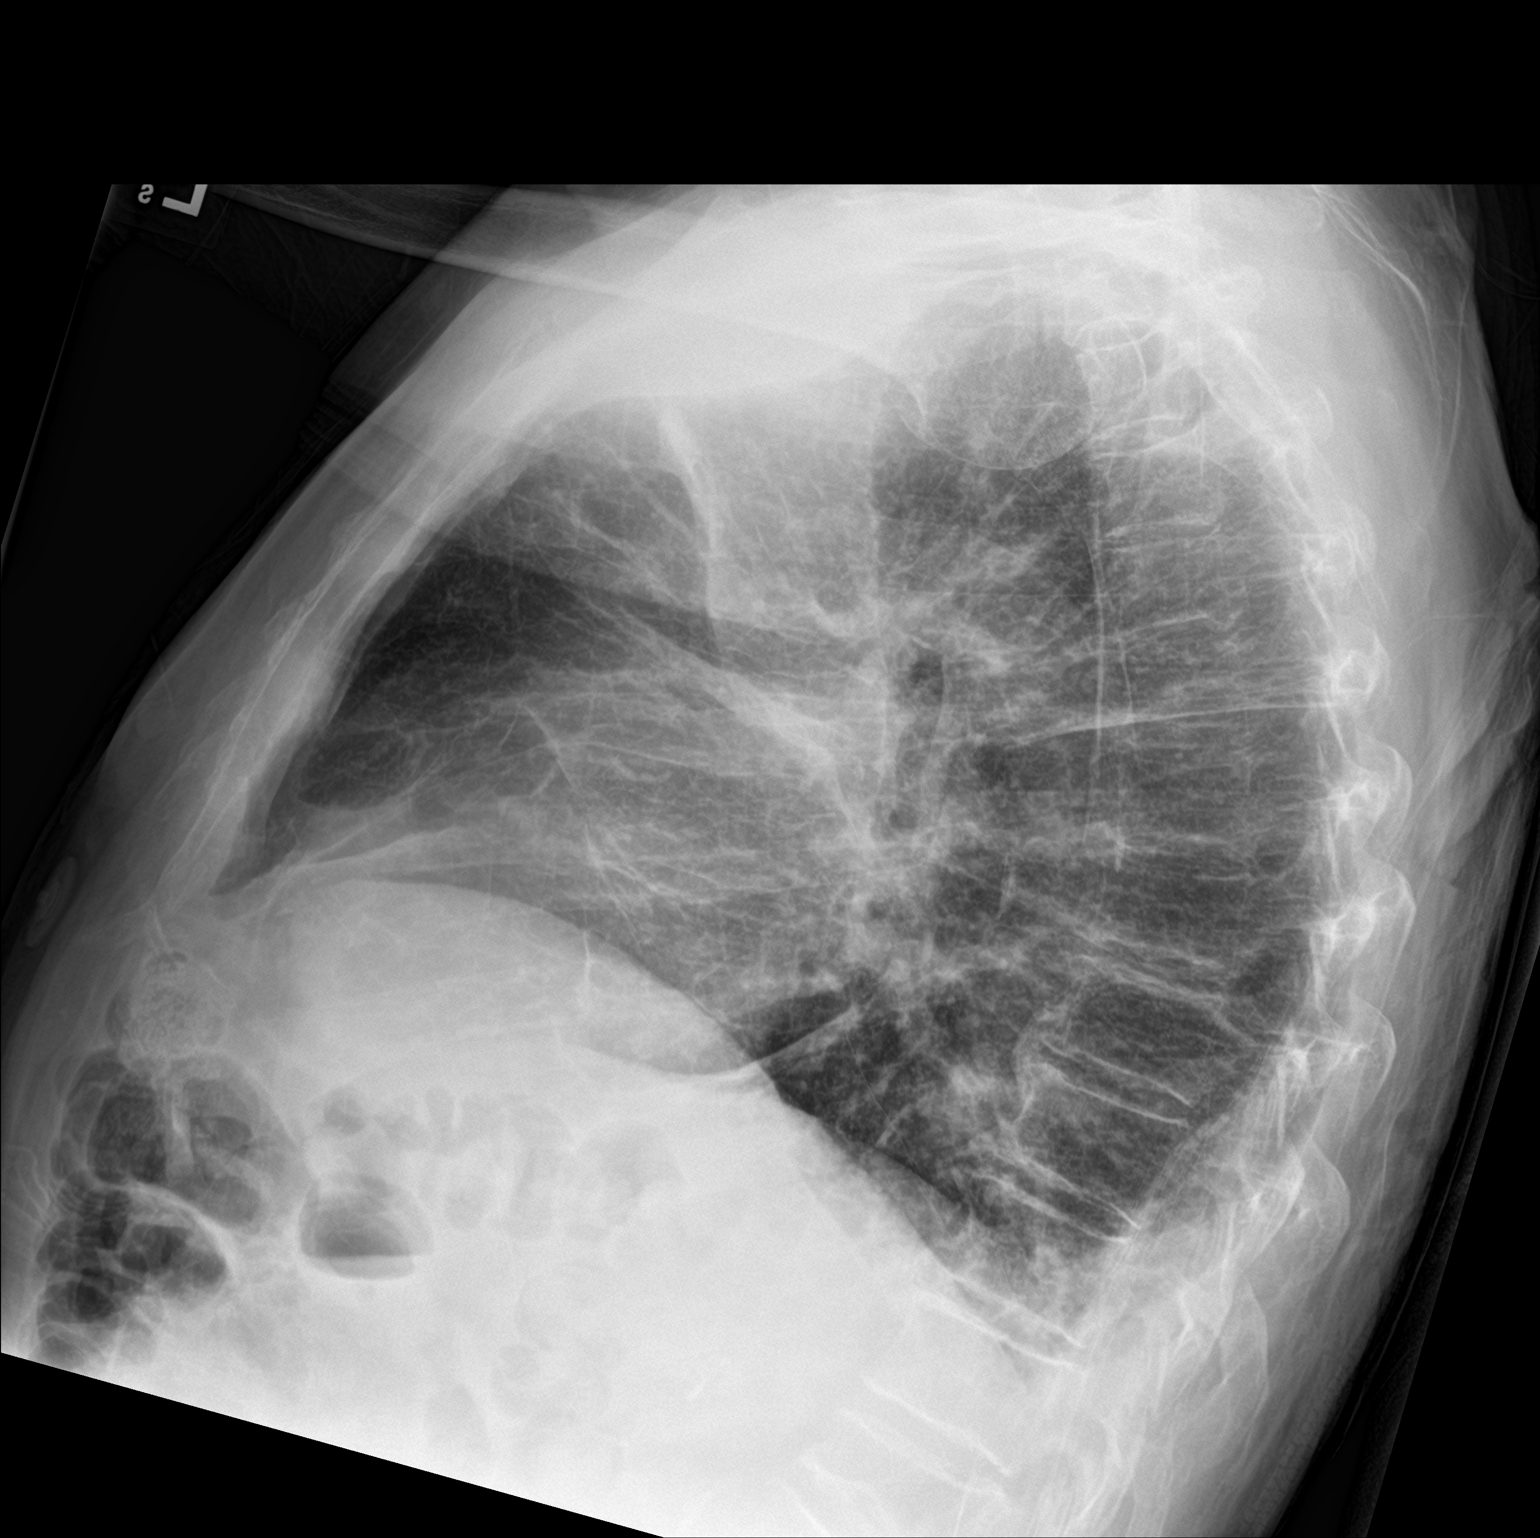

[chest ap]
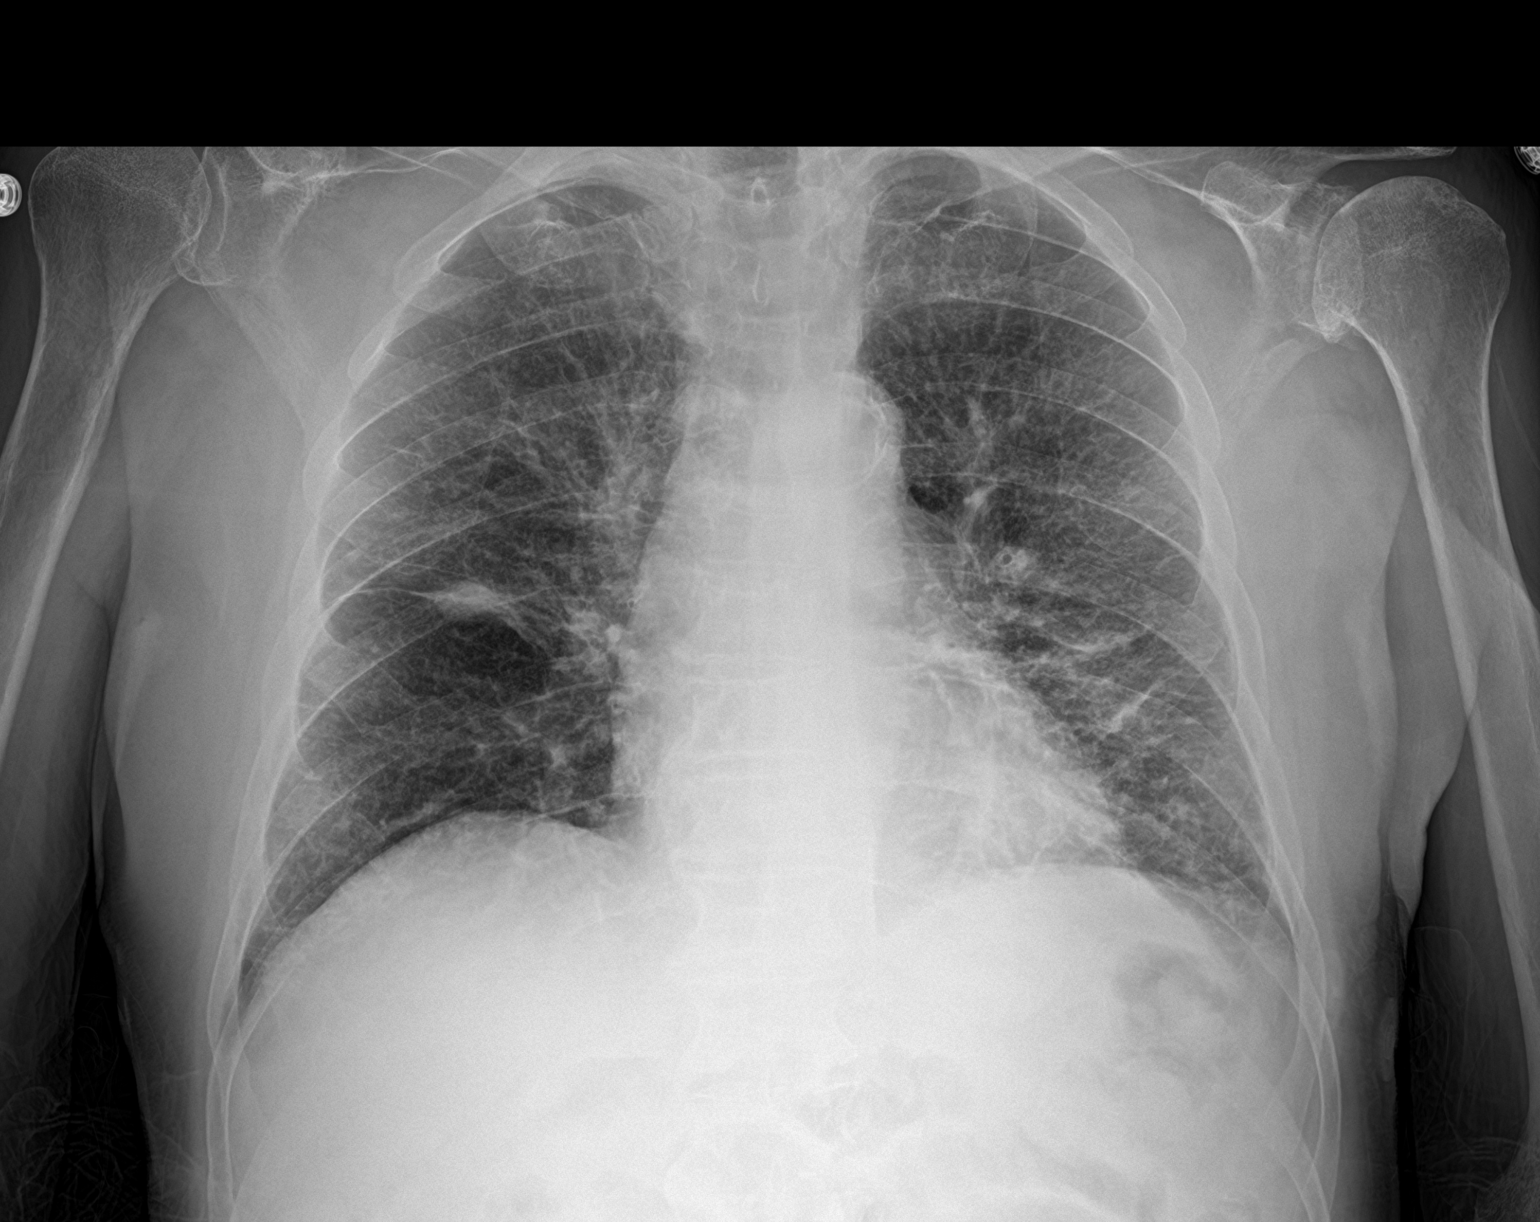

[2 of 2 positions shown; findings below may reference images not displayed]

FINDINGS: Today's study is obtained in a more lordotic fashion. The lungs are
well-expanded. The interstitial markings are coarse. There is
thickening of the minor fissure. There is patchy increased density
at the left lung base. Previously demonstrated density in the right
upper lobe is less conspicuous today. The heart and pulmonary
vascularity are normal. There is calcification in the wall of the
aortic arch.
IMPRESSION: Chronic interstitial changes bilaterally. Slightly increased
interstitial density at the left lung base may reflect developing
pneumonia.

Thoracic aortic atherosclerosis.
